# Patient Record
Sex: Female | Born: 1950 | Race: White | Hispanic: No | Marital: Married | Smoking: Never smoker
Health system: Southern US, Community
[De-identification: ages and names within clinical notes are randomized; demographics above are authoritative.]

## PROBLEM LIST (undated history)

## (undated) DIAGNOSIS — M4800 Spinal stenosis, site unspecified: Secondary | ICD-10-CM

## (undated) DIAGNOSIS — C4491 Basal cell carcinoma of skin, unspecified: Secondary | ICD-10-CM | POA: Insufficient documentation

## (undated) DIAGNOSIS — R319 Hematuria, unspecified: Secondary | ICD-10-CM

## (undated) DIAGNOSIS — K219 Gastro-esophageal reflux disease without esophagitis: Secondary | ICD-10-CM

## (undated) DIAGNOSIS — E079 Disorder of thyroid, unspecified: Secondary | ICD-10-CM

## (undated) DIAGNOSIS — G500 Trigeminal neuralgia: Secondary | ICD-10-CM

## (undated) DIAGNOSIS — Z9109 Other allergy status, other than to drugs and biological substances: Secondary | ICD-10-CM

## (undated) DIAGNOSIS — IMO0002 Reserved for concepts with insufficient information to code with codable children: Secondary | ICD-10-CM

## (undated) DIAGNOSIS — S060X9A Concussion with loss of consciousness of unspecified duration, initial encounter: Secondary | ICD-10-CM

## (undated) DIAGNOSIS — J45909 Unspecified asthma, uncomplicated: Secondary | ICD-10-CM

## (undated) DIAGNOSIS — E7800 Pure hypercholesterolemia, unspecified: Secondary | ICD-10-CM

## (undated) DIAGNOSIS — T7840XA Allergy, unspecified, initial encounter: Secondary | ICD-10-CM

## (undated) DIAGNOSIS — N939 Abnormal uterine and vaginal bleeding, unspecified: Secondary | ICD-10-CM

## (undated) DIAGNOSIS — T4145XA Adverse effect of unspecified anesthetic, initial encounter: Secondary | ICD-10-CM

## (undated) DIAGNOSIS — Z8719 Personal history of other diseases of the digestive system: Secondary | ICD-10-CM

## (undated) DIAGNOSIS — M722 Plantar fascial fibromatosis: Secondary | ICD-10-CM

## (undated) DIAGNOSIS — E039 Hypothyroidism, unspecified: Secondary | ICD-10-CM

## (undated) DIAGNOSIS — T8859XA Other complications of anesthesia, initial encounter: Secondary | ICD-10-CM

## (undated) DIAGNOSIS — Z923 Personal history of irradiation: Secondary | ICD-10-CM

## (undated) DIAGNOSIS — C50211 Malignant neoplasm of 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 breast: Secondary | ICD-10-CM

## (undated) DIAGNOSIS — G43909 Migraine, unspecified, not intractable, without status migrainosus: Secondary | ICD-10-CM

## (undated) DIAGNOSIS — Z9889 Other specified postprocedural states: Secondary | ICD-10-CM

## (undated) DIAGNOSIS — IMO0001 Reserved for inherently not codable concepts without codable children: Secondary | ICD-10-CM

## (undated) HISTORY — DX: Hematuria, unspecified: R31.9

## (undated) HISTORY — DX: Gastro-esophageal reflux disease without esophagitis: K21.9

## (undated) HISTORY — DX: Disorder of thyroid, unspecified: E07.9

## (undated) HISTORY — PX: BLEPHAROPTOSIS REPAIR: SHX369

## (undated) HISTORY — PX: NASAL SEPTUM SURGERY: SHX37

## (undated) HISTORY — DX: Spinal stenosis, site unspecified: M48.00

## (undated) HISTORY — DX: Trigeminal neuralgia: G50.0

## (undated) HISTORY — PX: PLANTAR FASCIA SURGERY: SHX746

## (undated) HISTORY — DX: Other allergy status, other than to drugs and biological substances: Z91.09

## (undated) HISTORY — PX: BLADDER SURGERY: SHX569

## (undated) HISTORY — PX: TONSILLECTOMY: SHX28A

## (undated) HISTORY — PX: INGUINAL HERNIA REPAIR: SHX194

## (undated) HISTORY — PX: FOOT NEUROMA SURGERY: SHX646

## (undated) HISTORY — PX: BLADDER SUSPENSION: SHX72

## (undated) HISTORY — DX: Pure hypercholesterolemia, unspecified: E78.00

## (undated) HISTORY — DX: Malignant neoplasm of 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 breast: C50.211

## (undated) HISTORY — PX: TONSILLECTOMY: SUR1361

## (undated) HISTORY — DX: Plantar fascial fibromatosis: M72.2

## (undated) HISTORY — DX: Unspecified asthma, uncomplicated: J45.909

## (undated) HISTORY — PX: TONSILECTOMY/ADENOIDECTOMY WITH MYRINGOTOMY: SHX6125

## (undated) HISTORY — DX: Personal history of other diseases of the digestive system: Z87.19

## (undated) HISTORY — PX: BLEPHAROPLASTY: SUR158

## (undated) HISTORY — DX: Allergy, unspecified, initial encounter: T78.40XA

## (undated) HISTORY — DX: Migraine, unspecified, not intractable, without status migrainosus: G43.909

## (undated) HISTORY — PX: HERNIA REPAIR: SHX51

## (undated) HISTORY — DX: Other specified postprocedural states: Z98.890

## (undated) HISTORY — PX: FOOT SURGERY: SHX648

---
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
## 1989-01-31 HISTORY — PX: OTHER SURGICAL HISTORY: SHX169

## 1990-01-31 DIAGNOSIS — Z8719 Personal history of other diseases of the digestive system: Secondary | ICD-10-CM

## 1990-01-31 HISTORY — DX: Personal history of other diseases of the digestive system: Z87.19

## 2011-12-02 ENCOUNTER — Encounter: Payer: Self-pay | Admitting: Dermatology

## 2011-12-02 ENCOUNTER — Ambulatory Visit: Payer: Self-pay | Admitting: Dermatology

## 2011-12-02 VITALS — BP 126/84 | Ht 66.0 in | Wt 155.0 lb

## 2011-12-02 DIAGNOSIS — L814 Other melanin hyperpigmentation: Secondary | ICD-10-CM

## 2011-12-02 DIAGNOSIS — D1801 Hemangioma of skin and subcutaneous tissue: Secondary | ICD-10-CM

## 2011-12-02 DIAGNOSIS — D225 Melanocytic nevi of trunk: Secondary | ICD-10-CM

## 2011-12-02 DIAGNOSIS — L821 Other seborrheic keratosis: Secondary | ICD-10-CM

## 2011-12-02 NOTE — Progress Notes (Signed)
See Notes scanned under media tab

## 2011-12-05 NOTE — Progress Notes (Signed)
Faxed 12/02/11 chart notes to Dr. Randa Lynn

## 2012-03-30 ENCOUNTER — Ambulatory Visit: Payer: Self-pay | Admitting: Otolaryngology

## 2013-05-31 DIAGNOSIS — C4491 Basal cell carcinoma of skin, unspecified: Secondary | ICD-10-CM

## 2013-05-31 HISTORY — DX: Basal cell carcinoma of skin, unspecified: C44.91

## 2013-06-19 ENCOUNTER — Encounter: Payer: Self-pay | Admitting: Dermatology

## 2013-06-19 ENCOUNTER — Ambulatory Visit: Payer: Self-pay | Admitting: Dermatology

## 2013-06-19 VITALS — BP 132/80 | Ht 65.0 in | Wt 138.0 lb

## 2013-06-19 DIAGNOSIS — L658 Other specified nonscarring hair loss: Secondary | ICD-10-CM | POA: Insufficient documentation

## 2013-06-19 DIAGNOSIS — D225 Melanocytic nevi of trunk: Secondary | ICD-10-CM

## 2013-06-19 DIAGNOSIS — D485 Neoplasm of uncertain behavior of skin: Secondary | ICD-10-CM | POA: Insufficient documentation

## 2013-06-19 NOTE — Progress Notes (Signed)
See Notes scanned under media tab

## 2013-06-21 LAB — SURGICAL PATHOLOGY

## 2013-07-04 ENCOUNTER — Ambulatory Visit: Payer: Self-pay | Admitting: Dermatology

## 2013-07-04 ENCOUNTER — Encounter: Payer: Self-pay | Admitting: Dermatology

## 2013-07-04 VITALS — BP 105/64 | HR 71

## 2013-07-04 DIAGNOSIS — C44311 Basal cell carcinoma of skin of nose: Secondary | ICD-10-CM | POA: Insufficient documentation

## 2013-07-04 NOTE — Patient Instructions (Signed)
[facility], Unknown Jim, M.D., Ph. D.  [phone]    Wound Care Instructions     Today and tomorrow (Thursday and Friday)   Keep bandage clean, dry and in place. Reinforce with tape if necessary.   Take Tylenol or Ibuprofen ad needed for pain.   Apply ice (use a bag of frozen peas or corn, or put ice in a baggie and wrap with a kitchen towel) directly over your bandage for 15 minutes every hour until bedtime on the procedure day.   Rest and minimize activity.       On Saturday morning   Remove bandage. Soaking with water may ease removal.  The bandage is one layer of white tape, one layer of clear tape, gauze, and telfa that is next to the wound.   Wash over the wound with mild soapy water and clean fingers. DO NOT use a washcloth. Pat dry.   Apply a thin layer of Vaseline over the stitches and cover with a bandage.   You may have swelling and bruising around the incision.   You may shower and wash hair.     Saturday until return appointment   Continue to wash the area as instructed.    Activity   No lifting over 10 pounds, sports, exercising, or bending at the waist for 1 week.   Chew softer food.   Elevate head on 2 pillows  for the next 2 days.    Bleeding   A small amount of bleeding is normal.   If it soaks the bandage, apply firm pressure with your hand directly over the bandage for 20 minutes.   DO NOT use ice to apply pressure.    When to call the office   If bleeding does not stop after 20 minutes of firm pressure.   If Tylenol or Ibuprofen does not relieve your pain.   If your wound becomes increasingly painful, warm to touch, drains a pus like substance, or you develop fever and chills.    Please be advised that your suture line is usually much longer than you anticipate.    If you call the office during normal business hours with a non-emergent question, please allow until the end of that business day for [place] to return your call.    For after hours  emergencies, call the same number and the on call Physician will return your call.

## 2013-07-08 NOTE — Progress Notes (Signed)
PREOPERATIVE DIAGNOSIS: Basal cell carcinoma    POSTOPERATIVE DIAGNOSIS: Basal cell carcinoma    OPERATION: Mohs micrographic surgery    INDICATIONS: This 63 y.o. patient was referred by York Cerise, M.D. 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  Mohs micrographic surgery is indicated given the anatomic location.  The preoperative size is 0.3 x 0.4 cm.  The Mohs surgical procedure was explained including other therapeutic options, and the inherent risks of bleeding, scar formation, reaction to local anesthesia, cosmetic deformity, recurrence, infection, and nerve damage.  After informed consent and appropriate instruction, the patient underwent Mohs surgery using fresh tissue technique as follows.    STAGE I: The site of the skin cancer was identified concurrently by both the patient and the Mohs surgeon and marked with a surgical pen; the margins of the excision were delineated with the marking pen. The patient was then positioned on the operative table. The lesion was defined and infiltrated with 1 percent buffered lidocaine with epinephrine 1:100,000. All gross tumor was completely excised in a debulking stage using aggressive curettage and/or cold steel.  With all visible gross tumor completely excised, an excision was made around the debulking defect. Hemostasis was obtained by spot electrodesiccation and a dressing was placed. The tissue was submitted as 2section(s) which was mapped, color coded at their margins and sent to the technician for frozen sectioning. Microscopic tumor was found persisting in 0 sections.    With the lesions clear of microscopic tumor, surgery was considered complete.  Following surgery, the defect measured as follows: 0.4 x 0.5 cm.  The defect was repaired with unilateral single arm advancement ("O to L") flap.    FINAL DIAGNOSIS: Primary Basal cell carcinoma    CONDITION AT TERMINATION OF THERAPY: Carcinoma removed.    COMPLICATIONS:  None.      PREOPERATIVE DIAGNOSIS:  Defect following Mohs micrographic surgery for a basal cell carcinoma  POSTOPERATIVE DIAGNOSIS: Basal cell carcinoma  OPERATION: Adjacent tissue transfer: Unilateral single arm advancement ('O-to-L') flap (Burow's Advancement Flap)    INDICATIONS: The patient was left with a defect measuring 0.4 x 0.5 cm located on the right nasal tip following Mohs micrographic surgery for removal of a basal cell carcinoma.  Various closure modalities were discussed with the patient and it was decided that a Adjacent tissue transfer: Unilateral single arm advancement ('O-to-L') flap (Burow's Advancement Flap) would best preserve normal anatomical and functional relationships.  The procedure was explained, including the risks of bleeding scarring, infection, wound dehiscence, numbness or other nerve damage, flap/graft necrosis and reaction to local anesthesia, informed consent was obtained and the patient underwent the procedure as follows.    Unilateral single arm (Burow's) advancement flap    Procedure:  The patient was taken to the operative suite and positioned comfortably on the operating room table.  The area was anesthetized with 1% Lidocaine with epinephrine.  The area was washed with Hibiclens, rinsed with saline and draped with sterile towels. Wound edges were debeveled and undermined.  An incision was made in the lateral direction.  The flap was carefully undermined in a subcutaneous plane to the pedicle base.  Bleeding was controlled with spot electrodesiccation.  The flap was then advanced into the primary defect and secured with buried 5-0 Monocryl sutures.  The secondary defect was closed deeply with buried 5-0  Monocryl sutures. An excess cone of tissue was removed with the triangulation technique. The epidermis was meticulously approximated with simple running 5-0  Prolene sutures. Final flap dimensions: 3 x 2 cm.  A sterile pressure dressing was applied and wound care instructions  were given.  The patient was discharged ambulatory and with stable vital signs.  Return visit in one week for suture removal.      Final Procedure: Unilateral single arm (Burows) advancement flap.    Complications:  None.      For this note, Micah Flesher is acting as my scribe. This note was reviewed by me and edited as appropriate.

## 2013-07-09 ENCOUNTER — Telehealth: Payer: Self-pay

## 2013-07-09 NOTE — Telephone Encounter (Signed)
Spoke with patient about path results on 06/26/13 BCC right nasal tip-discussed mohs and XRT-pt will call Dr Kathlen Brunswick to schedule appt.

## 2013-07-11 ENCOUNTER — Ambulatory Visit: Payer: Self-pay | Admitting: Dermatology

## 2013-07-11 DIAGNOSIS — Z4802 Encounter for removal of sutures: Secondary | ICD-10-CM

## 2013-07-11 NOTE — Progress Notes (Signed)
Plan:    Keep applying vasline to the area until next visit.    Follow-up:

## 2013-07-11 NOTE — Progress Notes (Signed)
Follow-up visit 1 days status-post Mohs micrographic surgery for a Basal cell carcinoma of the right nasal tip with unilateral single arm advancement flap ("O to L") flap.  The patient is doing well without concerns.    Physical Examination:    The patient is alert and oriented and in no acute distress.  The surgical wound is clean and intact.  Erythema: Mild  Edema: Mild  Infection: None  Dehiscence: None  Contusion: None  Hematoma: None    The contour and function of the right nasal tip is well preserved.      Assessment:    Well healing surgical wound, status-post Mohs micrographic surgery.    Plan:    Sutures removed, wound care instructions provided.    Follow-up:    2 week(s)    For this note, Mauricia Area, RN  is acting as my scribe. This note was reviewed by me and edited where appropriate.

## 2013-07-29 ENCOUNTER — Encounter: Payer: Self-pay | Admitting: Dermatology

## 2013-07-29 ENCOUNTER — Ambulatory Visit: Payer: Self-pay | Admitting: Dermatology

## 2013-07-29 DIAGNOSIS — Z5189 Encounter for other specified aftercare: Secondary | ICD-10-CM

## 2013-07-29 NOTE — Progress Notes (Signed)
Follow-up visit 3 weeks status-post Mohs micrographic surgery for a Basal cell carcinoma of the right nasal tip with unilateral single arm advancement flap ("O to L") flap.  The patient is doing well without concerns.    Physical Examination:    The patient is alert and oriented and in no acute distress.  The surgical wound is clean and intact.  Erythema: Mild  Edema: Mild  Infection: None  Dehiscence: None  Contusion: None  Hematoma: None    The contour and function of the right nasal tip is well preserved.      Assessment:    Well healing surgical wound, status-post Mohs micrographic surgery.    Plan:    Can massage with vaseline at night if she wants to.     Follow-up:    With Dr. Tommi Emery for routine skin exams.     For this note, Toni Powell is acting as my scribe. This note was reviewed by me and edited where appropriate

## 2013-08-01 ENCOUNTER — Ambulatory Visit: Payer: Self-pay | Admitting: Dermatology

## 2014-03-21 ENCOUNTER — Ambulatory Visit: Payer: Self-pay | Admitting: Dermatology

## 2014-03-21 ENCOUNTER — Encounter: Payer: Self-pay | Admitting: Dermatology

## 2014-03-21 ENCOUNTER — Encounter (INDEPENDENT_AMBULATORY_CARE_PROVIDER_SITE_OTHER): Payer: Self-pay

## 2014-03-21 VITALS — BP 126/70 | Ht 65.0 in | Wt 145.0 lb

## 2014-03-21 DIAGNOSIS — D485 Neoplasm of uncertain behavior of skin: Secondary | ICD-10-CM

## 2014-03-21 DIAGNOSIS — L821 Other seborrheic keratosis: Secondary | ICD-10-CM

## 2014-03-21 DIAGNOSIS — Z85828 Personal history of other malignant neoplasm of skin: Secondary | ICD-10-CM | POA: Insufficient documentation

## 2014-03-21 DIAGNOSIS — D225 Melanocytic nevi of trunk: Secondary | ICD-10-CM

## 2014-03-21 NOTE — Progress Notes (Signed)
See Notes scanned under media tab

## 2014-03-24 LAB — SURGICAL PATHOLOGY

## 2014-09-18 ENCOUNTER — Other Ambulatory Visit: Payer: Self-pay

## 2014-09-18 DIAGNOSIS — Z1231 Encounter for screening mammogram for malignant neoplasm of breast: Secondary | ICD-10-CM

## 2014-10-07 ENCOUNTER — Ambulatory Visit: Payer: Self-pay | Admitting: Podiatry

## 2014-10-22 DIAGNOSIS — H1010 Acute atopic conjunctivitis, unspecified eye: Secondary | ICD-10-CM | POA: Insufficient documentation

## 2014-10-22 DIAGNOSIS — J309 Allergic rhinitis, unspecified: Principal | ICD-10-CM

## 2014-10-22 DIAGNOSIS — J45909 Unspecified asthma, uncomplicated: Secondary | ICD-10-CM | POA: Insufficient documentation

## 2014-10-28 ENCOUNTER — Ambulatory Visit
Admission: RE | Admit: 2014-10-28 | Discharge: 2014-10-28 | Disposition: A | Discharge status: Home / Self Care | Payer: 59 | Source: Ambulatory Visit

## 2014-10-28 DIAGNOSIS — Z1231 Encounter for screening mammogram for malignant neoplasm of breast: Secondary | ICD-10-CM

## 2014-10-30 ENCOUNTER — Other Ambulatory Visit: Payer: Self-pay | Admitting: Family Medicine

## 2014-10-30 DIAGNOSIS — R928 Other abnormal and inconclusive findings on diagnostic imaging of breast: Secondary | ICD-10-CM

## 2014-11-06 ENCOUNTER — Other Ambulatory Visit: Payer: Self-pay | Admitting: Family Medicine

## 2014-11-06 ENCOUNTER — Ambulatory Visit
Admission: RE | Admit: 2014-11-06 | Discharge: 2014-11-06 | Disposition: A | Discharge status: Home / Self Care | Payer: 59 | Source: Ambulatory Visit | Attending: Family Medicine | Admitting: Family Medicine

## 2014-11-06 DIAGNOSIS — R928 Other abnormal and inconclusive findings on diagnostic imaging of breast: Secondary | ICD-10-CM

## 2014-11-10 ENCOUNTER — Other Ambulatory Visit: Payer: Self-pay | Admitting: Family Medicine

## 2014-11-10 DIAGNOSIS — R928 Other abnormal and inconclusive findings on diagnostic imaging of breast: Secondary | ICD-10-CM

## 2014-11-11 ENCOUNTER — Ambulatory Visit
Admission: RE | Admit: 2014-11-11 | Discharge: 2014-11-11 | Disposition: A | Discharge status: Home / Self Care | Payer: 59 | Source: Ambulatory Visit | Attending: Family Medicine | Admitting: Family Medicine

## 2014-11-11 DIAGNOSIS — R928 Other abnormal and inconclusive findings on diagnostic imaging of breast: Secondary | ICD-10-CM

## 2014-11-13 ENCOUNTER — Ambulatory Visit (INDEPENDENT_AMBULATORY_CARE_PROVIDER_SITE_OTHER): Payer: 59

## 2014-11-13 ENCOUNTER — Telehealth: Payer: Self-pay | Admitting: *Deleted

## 2014-11-13 ENCOUNTER — Encounter: Payer: Self-pay | Admitting: *Deleted

## 2014-11-13 DIAGNOSIS — C50211 Malignant neoplasm of 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 breast: Secondary | ICD-10-CM

## 2014-11-13 DIAGNOSIS — J309 Allergic rhinitis, unspecified: Secondary | ICD-10-CM | POA: Diagnosis not present

## 2014-11-13 HISTORY — DX: Malignant neoplasm of 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 breast: C50.211

## 2014-11-13 NOTE — Telephone Encounter (Signed)
Confirmed [facility] for 11/19/14 at 1230 .  Instructions and contact information given.

## 2014-11-19 ENCOUNTER — Ambulatory Visit: Payer: 59 | Admitting: Physical Therapy

## 2014-11-19 ENCOUNTER — Encounter: Payer: Self-pay | Admitting: Hematology and Oncology

## 2014-11-19 ENCOUNTER — Ambulatory Visit (HOSPITAL_BASED_OUTPATIENT_CLINIC_OR_DEPARTMENT_OTHER): Payer: 59 | Admitting: Hematology and Oncology

## 2014-11-19 ENCOUNTER — Other Ambulatory Visit (HOSPITAL_BASED_OUTPATIENT_CLINIC_OR_DEPARTMENT_OTHER): Payer: 59

## 2014-11-19 ENCOUNTER — Ambulatory Visit
Admission: RE | Admit: 2014-11-19 | Discharge: 2014-11-19 | Disposition: A | Discharge status: Home / Self Care | Payer: 59 | Source: Ambulatory Visit | Attending: Radiation Oncology | Admitting: Radiation Oncology

## 2014-11-19 ENCOUNTER — Other Ambulatory Visit: Payer: Self-pay | Admitting: General Surgery

## 2014-11-19 VITALS — BP 124/51 | HR 73 | Temp 98.4°F | Resp 18 | Ht 65.0 in | Wt 146.7 lb

## 2014-11-19 DIAGNOSIS — D0511 Intraductal carcinoma in situ of right breast: Secondary | ICD-10-CM | POA: Diagnosis not present

## 2014-11-19 DIAGNOSIS — Z800 Family history of malignant neoplasm of digestive organs: Secondary | ICD-10-CM

## 2014-11-19 DIAGNOSIS — C50211 Malignant neoplasm of 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 breast: Secondary | ICD-10-CM

## 2014-11-19 DIAGNOSIS — Z803 Family history of malignant neoplasm of breast: Secondary | ICD-10-CM

## 2014-11-19 LAB — COMPREHENSIVE METABOLIC PANEL (CC13)
ALBUMIN: 3.7 g/dL (ref 3.5–5.0)
ALK PHOS: 51 U/L (ref 40–150)
ALT: 25 U/L (ref 0–55)
ANION GAP: 6 meq/L (ref 3–11)
AST: 22 U/L (ref 5–34)
BILIRUBIN TOTAL: 0.38 mg/dL (ref 0.20–1.20)
BUN: 16.6 mg/dL (ref 7.0–26.0)
CALCIUM: 9.4 mg/dL (ref 8.4–10.4)
CO2: 27 mEq/L (ref 22–29)
Chloride: 108 mEq/L (ref 98–109)
Creatinine: 0.8 mg/dL (ref 0.6–1.1)
EGFR: 83 mL/min/{1.73_m2} — AB (ref >90)
Glucose: 97 mg/dl (ref 70–140)
Potassium: 4.6 mEq/L (ref 3.5–5.1)
Sodium: 141 mEq/L (ref 136–145)
TOTAL PROTEIN: 6.9 g/dL (ref 6.4–8.3)

## 2014-11-19 LAB — CBC WITH DIFFERENTIAL/PLATELET
BASO%: 0.4 % (ref 0.0–2.0)
Basophils Absolute: 0 10*3/uL (ref 0.0–0.1)
EOS ABS: 0.1 10*3/uL (ref 0.0–0.5)
EOS%: 0.9 % (ref 0.0–7.0)
HCT: 39.3 % (ref 34.8–46.6)
HEMOGLOBIN: 13.1 g/dL (ref 11.6–15.9)
LYMPH%: 21.8 % (ref 14.0–49.7)
MCH: 29.4 pg (ref 25.1–34.0)
MCHC: 33.3 g/dL (ref 31.5–36.0)
MCV: 88.3 fL (ref 79.5–101.0)
MONO#: 0.5 10*3/uL (ref 0.1–0.9)
MONO%: 7.6 % (ref 0.0–14.0)
NEUT%: 69.3 % (ref 38.4–76.8)
NEUTROS ABS: 4.7 10*3/uL (ref 1.5–6.5)
PLATELETS: 209 10*3/uL (ref 145–400)
RBC: 4.45 10*6/uL (ref 3.70–5.45)
RDW: 14.6 % — ABNORMAL HIGH (ref 11.2–14.5)
WBC: 6.8 10*3/uL (ref 3.9–10.3)
lymph#: 1.5 10*3/uL (ref 0.9–3.3)

## 2014-11-19 NOTE — Progress Notes (Signed)
Radiation Oncology         [phone] ________________________________  Initial Outpatient Consultation  Name: Valerie Snyder MRN: [id number]  Date: 11/19/2014  DOB: 06/23/1950  HL:KTGYBW,LSLHTDSKA STEWART, MD  Jovita Kussmaul, MD   REFERRING PHYSICIAN: Autumn Messing III, MD  DIAGNOSIS:  Right breast low grade DCIS, ER +, PR +    ICD-9-CM ICD-10-CM   1. Breast cancer of 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 breast ([place]) 174.2 C50.211      HISTORY OF PRESENT ILLNESS::Valerie Snyder is a 64 y.o. female who presents for our multidisciplinary breast clinic. The patient had a routine screening bilateral mammogram with 3D Tomo with CAD which found right breast calcifications. She underwent additional imaging and a biopsy was performed. A lesion was noted in the upper inner quadrant approximately 4 mm in size. Biopsy showed low grade DCIS, ER +, PR +. This appeared to involve an intraductal papilloma and calcification.   Mrs. Platter presents today with her husband. She recently injured her left wrist from using the computer track pad rather than the mouse. The left wrist is swollen and tender on palpation.   PREVIOUS RADIATION THERAPY: No  PAST MEDICAL HISTORY:  has a past medical history of Breast cancer of 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 breast (Valerie Snyder) (11/13/2014).    PAST SURGICAL HISTORY:No past surgical history on file.  FAMILY HISTORY: family history is not on file.  SOCIAL HISTORY:   Married,  recently moved from the [place] area. Recently retired   ALLERGIES: Keflex and Toradol  MEDICATIONS:  Current Outpatient Prescriptions  Medication Sig Dispense Refill  . albuterol (PROVENTIL HFA;VENTOLIN HFA) 108 (90 BASE) MCG/ACT inhaler Inhale into the lungs every 6 (six) hours as needed for wheezing or shortness of breath.    . calcium carbonate (CALCIUM 600) 1500 (600 CA) MG TABS tablet Take by mouth 2 (two) times daily with a meal.    . cetirizine (ZYRTEC) 10  MG tablet Take 10 mg by mouth daily.    . Coenzyme Q10 (CO Q-10) 100 MG CAPS Take by mouth.    . Cranberry 125 MG TABS Take by mouth.    . Cranberry-Vitamin C-Vitamin E (CRANBERRY PLUS VITAMIN C) 4200-20-3 MG-MG-UNIT CAPS Take by mouth.    . estradiol-norethindrone (ACTIVELLA) 1-0.5 MG per tablet Take 1 tablet by mouth daily.    . fluticasone (FLONASE) 50 MCG/ACT nasal spray Place 2 sprays into both nostrils daily.    Marland Kitchen L-Arginine 500 MG CAPS Take by mouth.    . Lansoprazole (PREVACID PO) Take 30 mg by mouth.    . levothyroxine (SYNTHROID, LEVOTHROID) 100 MCG tablet Take 100 mcg by mouth daily before breakfast.    . Magnesium 500 MG CAPS Take by mouth.    . Misc Natural Products (OSTEO BI-FLEX TRIPLE STRENGTH PO) Take by mouth.    . Multiple Vitamins-Minerals (CENTRUM SILVER ADULT 50+ PO) Take by mouth.    . Nutritional Supplements (OSTEO ADVANCE PO) Take by mouth.    . Omega-3 Fatty Acids (FISH OIL) 1000 MG CAPS Take by mouth.    . OXcarbazepine (TRILEPTAL) 150 MG tablet Take 150 mg by mouth 2 (two) times daily.    Marland Kitchen pyridOXINE (VITAMIN B-6) 100 MG tablet Take 100 mg by mouth daily.    . rosuvastatin (CRESTOR) 20 MG tablet Take 20 mg by mouth daily.    . SUMAtriptan (IMITREX) 25 MG tablet Take 25 mg by mouth every 2 (two) hours as needed for migraine. May repeat in 2  hours if headache persists or recurs.    . vitamin B-12 (CYANOCOBALAMIN) 1000 MCG tablet Take 1,000 mcg by mouth daily.    . Zinc 50 MG CAPS Take by mouth.     No current facility-administered medications for this encounter.    REVIEW OF SYSTEMS:  A 15 point review of systems is documented in the electronic medical record. This was obtained by the nursing staff. However, I reviewed this with the patient to discuss relevant findings and make appropriate changes.  No pain within the left or right breast nipple discharge or bleeding prior to biopsy. Patient denies any problems with swelling in her right arm. No bony pain headaches  dizziness or blurred vision   PHYSICAL EXAM:  Vitals with BMI 11/19/2014  Height 5\' 5"   Weight 146 lbs 11 oz  BMI 22.9  Systolic 798  Diastolic 51  Pulse 73  Respirations 18   Lungs are clear to auscultation bilaterally. Heart has regular rate and rhythm. No palpable cervical, supraclavicular, or axillary adenopathy. Left wrist is swollen and tender on palpation. Left breast revealed no palpable mass or nipple discharge. Examination of the right breast revealed a steri strip in place in the upper inner quadrant and a small biopsy site. No palpable mass or nipple discharge. Mildly tender on palpation in the areolar border possibly from a cyst.     ECOG = 0  LABORATORY DATA:  Lab Results  Component Value Date   WBC 6.8 11/19/2014   HGB 13.1 11/19/2014   HCT 39.3 11/19/2014   MCV 88.3 11/19/2014   PLT 209 11/19/2014   NEUTROABS 4.7 11/19/2014   No results found for: NA, K, CL, CO2, GLUCOSE, CREATININE, CALCIUM    RADIOGRAPHY: Mm Digital Diagnostic Unilat R  11/11/2014  CLINICAL DATA:  Stereotactic biopsy was performed of calcifications in the upper-inner quadrant of the right breast. EXAM: DIAGNOSTIC RIGHT MAMMOGRAM POST STEREOTACTIC BIOPSY COMPARISON:  Previous exam(s). FINDINGS: Mammographic images were obtained following stereotactic guided biopsy of calcifications in the upper inner right breast. A coil shaped biopsy clip is satisfactorily positioned at the biopsy site in the upper inner right breast. IMPRESSION: Satisfactory position of coil shaped biopsy clip. Final Assessment: Post Procedure Mammograms for Marker Placement Electronically Signed   By: Curlene Dolphin M.D.   On: 11/11/2014 16:52   Mm Digital Diagnostic Unilat R  11/06/2014  ADDENDUM REPORT: 11/06/2014 16:13 ADDENDUM: Patient is scheduled for the first available appointment at the [facility]. Electronically Signed   By: Fidela Salisbury M.D.   On: 11/06/2014 16:13  11/06/2014  CLINICAL DATA:   Right breast microcalcifications seen on most recent screening mammography. EXAM: DIGITAL DIAGNOSTIC RIGHT MAMMOGRAM COMPARISON:  Previous exam(s). ACR Breast Density Category c: The breast tissue is heterogeneously dense, which may obscure small masses. FINDINGS: Compression magnification views of the right breast demonstrate a group of indeterminate calcifications in the upper inner right breast, middle depth, which measures 0.4 by 0.4 by 0.3 cm. There is no associated mass. IMPRESSION: 4 mm indeterminate group of calcifications in the upper inner right breast, for which stereotactic core needle biopsy is recommended. RECOMMENDATION: Stereotactic core needle biopsy of the right breast. The patient will be scheduled at her convenience at the [facility]. I have discussed the findings and recommendations with the patient. Results were also provided in writing at the conclusion of the visit. If applicable, a reminder letter will be sent to the patient regarding the next appointment. BI-RADS CATEGORY  4: Suspicious. Electronically Signed: By: Fidela Salisbury M.D. On: 11/06/2014 14:52   Mm Screening Breast Tomo Bilateral  10/29/2014  CLINICAL DATA:  Screening. EXAM: DIGITAL SCREENING BILATERAL MAMMOGRAM WITH 3D TOMO WITH CAD COMPARISON:  Previous exam(s). ACR Breast Density Category c: The breast tissue is heterogeneously dense, which may obscure small masses. FINDINGS: In the right breast, calcifications warrant further evaluation with magnified views. In the left breast, no findings suspicious for malignancy. Images were processed with CAD. IMPRESSION: Further evaluation is suggested for calcifications in the right breast. RECOMMENDATION: Diagnostic mammogram of the right breast. (Code:[id number]) The patient will be contacted regarding the findings, and additional imaging will be scheduled. BI-RADS CATEGORY  0: Incomplete. Need additional imaging evaluation and/or prior mammograms for  comparison. Electronically Signed   By: Altamese Cabal M.D.   On: 10/29/2014 08:16   Mm Rt Breast Bx W Loc Dev 1st Lesion Image Bx Spec Stereo Guide  11/17/2014  ADDENDUM REPORT: 11/13/2014 15:15 ADDENDUM: Pathology reveals low grade ductal carcinoma in situ involving intraductal papilloma and calcification of the Right breast. This was found to be concordant by Dr. Curlene Dolphin. Pathology results were discussed with the patient via telephone. The patient reported tenderness at the biopsy site and is doing well otherwise. Post biopsy care and instructions were reviewed and questions were answered. The patient was encouraged to call [facility] with any additional questions and or concerns. The patient was referred to the [facility] at [facility] on November 19, 2014. Pathology results reported by Constance Holster on November 13, 2014. Electronically Signed   By: Curlene Dolphin M.D.   On: 11/13/2014 15:15  11/17/2014  CLINICAL DATA:  Calcifications upper inner right breast, for which stereotactic biopsy was recommended. EXAM: RIGHT BREAST STEREOTACTIC CORE NEEDLE BIOPSY COMPARISON:  Previous exams. FINDINGS: The patient and I discussed the procedure of stereotactic-guided biopsy including benefits and alternatives. We discussed the high likelihood of a successful procedure. We discussed the risks of the procedure including infection, bleeding, tissue injury, clip migration, and inadequate sampling. Informed written consent was given. The usual time out protocol was performed immediately prior to the procedure. Using sterile technique and 2% Lidocaine as local anesthetic, under stereotactic guidance, a 9 gauge vacuum assist device was used to perform core needle biopsy of calcifications in the upper inner quadrant of the right breast using a superior to inferior approach. Specimen radiograph was performed showing  calcifications within at least 2 of the cores. Specimens with calcifications are identified for pathology. At the conclusion of the procedure, a coil shaped tissue marker clip was deployed into the biopsy cavity. Follow-up 2-view mammogram was performed and dictated separately. IMPRESSION: Stereotactic-guided biopsy of the right breast. No apparent complications. Electronically Signed: By: Curlene Dolphin M.D. On: 11/11/2014 16:51      IMPRESSION: Ms. Sappington is a pleasant 64 yo woman with Right breast low grade DCIS, ER +, PR +. The lesion is likely small in light of the minimal calcifications noted on mammography The patient would be a good candidate for breast conservation with surgery and will consider radiation therapy in the post operative setting.   PLAN: I have discussed the benefits, side effects, and risks of external beam radiation treatment with the patient and family. We will follow up after her lumpectomy to discuss the pathology results. I have advised for the patient to see her primary care physician in relation to her painful swollen  left wrist.      This document serves as a record of services personally performed by Gery Pray, MD. It was created on his behalf by Arlyce Harman, a trained medical scribe. The creation of this record is based on the scribe's personal observations and the provider's statements to them. This document has been checked and approved by the attending provider. ------------------------------------------------  Blair Promise, PhD, MD

## 2014-11-19 NOTE — Progress Notes (Signed)
Pt's husband wanted to discuss financial assistance.  I informed him since this is her first visit there is no treatment plan available to offer assistance for.  Once her treatment plan has been established I will review it and schedule an appointment with the pt to go over foundations that offer copay assistance and any other resources that are available.  He wanted to know if he had to contact every department to inquire about financial assistance.  I told him yes and I gave him Meredith's contact information if radiation is part of her treatment.  Although frustrated he verbalized understanding.

## 2014-11-19 NOTE — Assessment & Plan Note (Signed)
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  Pathology review: I discussed with the patient the difference between DCIS and invasive breast cancer. It is considered a precancerous lesion. DCIS is classified as a 0. It is generally detected through mammograms as calcifications. We discussed the significance of grades and its impact on prognosis. We also discussed the importance of ER and PR receptors and their implications to adjuvant treatment options. Prognosis of DCIS dependence on grade, comedo necrosis. It is anticipated that if not treated, 20-30% of DCIS can develop into invasive breast cancer.  Recommendation: genetic counseling given the fact that she has 2 paternal cousins with breast cancer before age 42 1. Breast conserving surgery 2. Followed by adjuvant radiation therapy 3. Followed by antiestrogen therapy with tamoxifen vs anastrozole 5 years  Return to clinic after surgery to discuss the final pathology report and come up with an adjuvant treatment plan.

## 2014-11-19 NOTE — Progress Notes (Signed)
[place] NOTE  Patient Care Team: Leighton Ruff, MD as PCP - General (Family Medicine) Autumn Messing III, MD as Consulting Physician (General Surgery) Nicholas Lose, MD as Consulting Physician (Hematology and Oncology) Gery Pray, MD as Consulting Physician (Radiation Oncology) Mauro Kaufmann, RN as Registered Nurse Rockwell Germany, RN as Registered Nurse  CHIEF COMPLAINTS/PURPOSE OF CONSULTATION:  Newly diagnosed Right breast DCIS  HISTORY OF PRESENTING ILLNESS:  Valerie Snyder 64 y.o. female is here because of recent diagnosis of right breast DCIS. She had T screening mammogram which showed indeterminate calcifications in the right breast. This was evaluated by ultrasound and biopsy was performed. Pathology came back as low-grade DCIS that was ER 100% when necessary 100%. She was presented at the multidisciplinary tumor board and she is here today to discuss treatment plan. She denies any lumps or nodules. Recently she injured her left hand at the wrist which is now swollen and painful. She is also scheduled to undergo vaginal ultrasound this week for lower abdominal symptoms.   I reviewed her records extensively and collaborated the history with the patient.  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  She was exposed to hormone replacement therapy for 16 years She has  family history of Breast/GYN/GI cancer 2 paternal cousins diagnosed with breast  cancer age 50 and 40  MEDICAL HISTORY:  Past Medical History  Diagnosis Date  . Breast cancer of 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 breast ([place]) 11/13/2014  . Thyroid disease   . H/O bilateral inguinal hernia repair 1992  . GERD (gastroesophageal reflux disease)   . Migraines   . Hypercholesteremia   . Allergy   . Asthma   . Plantar fasciitis, bilateral   . Spinal stenosis     SURGICAL HISTORY: Past Surgical History  Procedure Laterality Date  . Tonsilectomy/adenoidectomy with myringotomy    . Bladder suspension    . Removal of basal cell carcinoma (nose)    . Neuromas removed from right foot    . Nasal septum surgery      SOCIAL HISTORY: Social History   Social History  . Marital Status: Married    Spouse Name: N/A  . Number of Children: N/A  . Years of Education: N/A   Occupational History  . Not on file.   Social History Main Topics  . Smoking status: Never Smoker   . Smokeless tobacco: Not on file  . Alcohol Use: No  . Drug Use: No  . Sexual Activity: Not on file   Other Topics Concern  . Not on file   Social History Narrative    FAMILY HISTORY: Family History  Problem Relation Age of Onset  . Stomach cancer Maternal Aunt   . Breast cancer Cousin     ALLERGIES:  is allergic to keflex and toradol.  MEDICATIONS:  Current Outpatient Prescriptions  Medication Sig Dispense Refill  . albuterol (PROVENTIL HFA;VENTOLIN HFA) 108 (90 BASE) MCG/ACT inhaler Inhale into the lungs  every 6 (six) hours as needed for wheezing or shortness of breath.    . calcium carbonate (CALCIUM 600) 1500 (600 CA) MG TABS tablet Take by mouth 2 (two) times daily with a meal.    . cetirizine (ZYRTEC) 10 MG tablet Take 10 mg by mouth daily.    . Cholecalciferol (D-3-5) 5000 UNITS capsule Take 5,000 Units by mouth daily.    . Coenzyme Q10 (CO Q-10) 100 MG CAPS Take by mouth.    . Cranberry-Vitamin C-Vitamin E (CRANBERRY PLUS VITAMIN C) 4200-20-3 MG-MG-UNIT CAPS Take by mouth.     . EPINEPHrine (EPIPEN 2-PAK) 0.3 mg/0.3 mL IJ SOAJ injection Inject into the muscle once.    Marland Kitchen estradiol (ESTRACE) 0.1 MG/GM vaginal cream Place 1 Applicatorful vaginally at bedtime.    Marland Kitchen estradiol-norethindrone (ACTIVELLA) 1-0.5 MG per tablet Take 1 tablet by mouth daily.    . fluticasone (FLONASE) 50 MCG/ACT nasal spray Place 2 sprays into both nostrils daily.    Marland Kitchen L-Arginine 500 MG CAPS Take by mouth.    . Lansoprazole (PREVACID PO) Take 30 mg by mouth.    . levothyroxine (SYNTHROID, LEVOTHROID) 100 MCG tablet Take 100 mcg by mouth daily before breakfast.    . Magnesium 500 MG CAPS Take by mouth.    . Misc Natural Products (OSTEO BI-FLEX TRIPLE STRENGTH PO) Take by mouth.    . Multiple Vitamins-Minerals (CENTRUM SILVER ADULT 50+ PO) Take by mouth.    . Olopatadine HCl 0.6 % SOLN USE 1-2 SPRAYS IN EACH NOSTRIL ONCE DAILY IN THE EVENING AS NEEDED FOR RUNNY NOSE OR CONGESTION.  4  . Omega-3 Fatty Acids (FISH OIL) 1000 MG CAPS Take by mouth.    . Probiotic Product (SOLUBLE FIBER/PROBIOTICS PO) Take by mouth.    . pyridOXINE (VITAMIN B-6) 100 MG tablet Take 100 mg by mouth daily.    . rosuvastatin (CRESTOR) 20 MG tablet Take 20 mg by mouth daily.    . SUMAtriptan (IMITREX) 25 MG tablet Take 25 mg by mouth every 2 (two) hours as needed for migraine. May repeat in 2 hours if headache persists or recurs.    . tolterodine (DETROL LA) 4 MG 24 hr capsule Take 4 mg by mouth daily.    . TURMERIC CURCUMIN PO Take 1,000 mg by mouth 3 (three) times daily.    . vitamin B-12 (CYANOCOBALAMIN) 1000 MCG tablet Take 1,000 mcg by mouth daily.    . Zinc 50 MG CAPS Take by mouth.    . Cranberry 125 MG TABS Take by mouth.    . Nutritional Supplements (OSTEO ADVANCE PO) Take by mouth.    . OXcarbazepine (TRILEPTAL) 150 MG tablet Take 150 mg by mouth 2 (two) times daily.     No current facility-administered medications for this visit.    REVIEW OF SYSTEMS:   Constitutional: Denies fevers, chills or abnormal  night sweats Eyes: Denies blurriness of vision, double vision or watery eyes Ears, nose, mouth, throat, and face: Denies mucositis or sore throat Respiratory: Denies cough, dyspnea or wheezes Cardiovascular: Denies palpitation, chest discomfort or lower extremity swelling Gastrointestinal:  Denies nausea, heartburn or change in bowel habits Skin: Denies abnormal skin rashes Lymphatics: Denies new lymphadenopathy or easy bruising Neurological:Denies numbness, tingling or new weaknesses Behavioral/Psych: Mood is stable, no new changes  Breast:  Denies any palpable lumps or discharge Extremity left arm wrist swelling All other systems were reviewed with the patient and are negative.  PHYSICAL EXAMINATION: ECOG PERFORMANCE STATUS: 1 - Symptomatic but completely  ambulatory  Filed Vitals:   11/19/14 1259  BP: 124/51  Pulse: 73  Temp: 98.4 F (36.9 C)  Resp: 18   Filed Weights   11/19/14 1259  Weight: 146 lb 11.2 oz (66.543 kg)    GENERAL:alert, no distress and comfortable SKIN: skin color, texture, turgor are normal, no rashes or significant lesions EYES: normal, conjunctiva are pink and non-injected, sclera clear OROPHARYNX:no exudate, no erythema and lips, buccal mucosa, and tongue normal  NECK: supple, thyroid normal size, non-tender, without nodularity LYMPH:  no palpable lymphadenopathy in the cervical, axillary or inguinal LUNGS: clear to auscultation and percussion with normal breathing effort HEART: regular rate & rhythm and no murmurs and no lower extremity edema ABDOMEN:abdomen soft, non-tender and normal bowel sounds Musculoskeletal:no cyanosis of digits and no clubbing  PSYCH: alert & oriented x 3 with fluent speech NEURO: no focal motor/sensory deficits BREAST: No palpable nodules in breast. 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  Pathology review: I discussed with the patient the difference between DCIS and invasive breast cancer. It is considered a precancerous lesion. DCIS is classified as a 0. It is generally detected through mammograms as calcifications. We discussed the significance of grades and its impact on prognosis. We also discussed the importance of ER and PR receptors and their implications to adjuvant treatment options. Prognosis of DCIS dependence on grade, comedo necrosis. It is anticipated that if not treated, 20-30% of DCIS can develop into invasive breast cancer.  Recommendation: genetic counseling given the fact that she has 2 paternal cousins with breast cancer before age 1 1. Breast conserving surgery 2. Followed by adjuvant radiation therapy 3. Followed by antiestrogen therapy with tamoxifen vs anastrozole 5 years  Return to clinic after surgery to discuss the final pathology report and come up with an adjuvant treatment plan.  All questions were answered. The patient knows to call the clinic with any problems, questions or concerns.    Rulon Eisenmenger, MD 4:23 PM

## 2014-11-21 ENCOUNTER — Ambulatory Visit (INDEPENDENT_AMBULATORY_CARE_PROVIDER_SITE_OTHER): Payer: 59 | Admitting: Neurology

## 2014-11-21 ENCOUNTER — Other Ambulatory Visit: Payer: 59

## 2014-11-21 ENCOUNTER — Ambulatory Visit (HOSPITAL_BASED_OUTPATIENT_CLINIC_OR_DEPARTMENT_OTHER): Payer: 59 | Admitting: Genetic Counselor

## 2014-11-21 ENCOUNTER — Encounter: Payer: Self-pay | Admitting: Genetic Counselor

## 2014-11-21 DIAGNOSIS — Z85828 Personal history of other malignant neoplasm of skin: Secondary | ICD-10-CM

## 2014-11-21 DIAGNOSIS — D0511 Intraductal carcinoma in situ of right breast: Secondary | ICD-10-CM | POA: Diagnosis not present

## 2014-11-21 DIAGNOSIS — Z803 Family history of malignant neoplasm of breast: Secondary | ICD-10-CM

## 2014-11-21 DIAGNOSIS — Z86010 Personal history of colonic polyps: Secondary | ICD-10-CM

## 2014-11-21 DIAGNOSIS — Z315 Encounter for genetic counseling: Secondary | ICD-10-CM | POA: Diagnosis not present

## 2014-11-21 DIAGNOSIS — Z8043 Family history of malignant neoplasm of testis: Secondary | ICD-10-CM

## 2014-11-21 DIAGNOSIS — Z8371 Family history of colonic polyps: Secondary | ICD-10-CM

## 2014-11-21 DIAGNOSIS — Z808 Family history of malignant neoplasm of other organs or systems: Secondary | ICD-10-CM

## 2014-11-21 DIAGNOSIS — J309 Allergic rhinitis, unspecified: Secondary | ICD-10-CM | POA: Diagnosis not present

## 2014-11-21 DIAGNOSIS — Z800 Family history of malignant neoplasm of digestive organs: Secondary | ICD-10-CM | POA: Diagnosis not present

## 2014-11-21 DIAGNOSIS — C50211 Malignant neoplasm of 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 breast: Secondary | ICD-10-CM

## 2014-11-21 NOTE — Progress Notes (Signed)
  [facility] Psychosocial Distress Screening Counseling Intern was referred by distress screening protocol.  The patient scored a 1 on the Psychosocial Distress Thermometer which indicatesMild distress. Counseling Intern to assess for distress and other psychosocial needs.   ONCBCN DISTRESS SCREENING 11/21/2014  Screening Type Initial Screening  Distress experienced in past week (1-10) 1  Family Problem type Other (comment)  Information Concerns Type Lack of info about diagnosis;Lack of info about treatment;Lack of info about complementary therapy choices;Lack of info about maintaining fitness  Referral to financial advocate Yes  Referral to support programs Yes   Counseling Intern note: Met with Pt and husband Christia Reading during [facility] on 10/19. Pt reported feeling positive about her Dx and treatment, with slight concern for the process of letting her loved ones know. Pt reported that her faith is an important part of her coping resources along with family that she stays well connected with (2 grown children, 7 brothers and sisters). Pt and husband stated that they moved to [place] in June and are still building local supportive relationships (searching for a faith community).  Pt and husband reported interest in speaking to social workers and/or financial advocates about the cost of treatment.   Pt reported feeling a distress level of 1 prior to [facility] and a level of 0 at the time of our encournter and she reported feeling satisfied in terms of her informational concerns post clinic.  Counseling intern will facilitate connection to social workers and Mining engineer.  Follow up: call with counseling intern scheduled for 11/2.    Vaughan Sine Counseling Intern [phone]

## 2014-11-21 NOTE — Progress Notes (Signed)
REFERRING PROVIDER: Nicholas Lose, MD  PRIMARY PROVIDER:  Gerrit Heck, MD  PRIMARY REASON FOR VISIT:  1. Breast cancer of 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 breast ([place])   2. Family history of breast cancer in female   3. Family history of pancreatic cancer   4. Family history of testicular cancer   5. History of basal cell carcinoma   6. Family history of basal cell carcinoma   7. History of colonic polyps   8. Family history of colonic polyps      HISTORY OF PRESENT ILLNESS:   Valerie Snyder, a 64 y.o. female, was seen for a [facility] cancer genetics consultation at the request of Dr. Lindi Adie due to a personal and family history of breast cancer.  Valerie Snyder presents to clinic today with her husband to discuss the possibility of a hereditary predisposition to cancer, genetic testing, and to further clarify her future cancer risks, as well as potential cancer risks for family members.   In 2016, at the age of 47, Valerie Snyder was diagnosed with DCIS of the right breast. Hormone receptor status was ER/PR+.  Surgical date will be set pending return of genetic test results.    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  First live birth at age 65.  OCP use for approximately 26 years total  Ovaries intact: yes.  Hysterectomy: no.  Menopausal status: postmenopausal.  HRT use: 16 years. Colonoscopy: yes; history of pre-cancerous colon polyps of unknown number; last colonoscopy was in 2014; on a 3-year schedule, so will have another in 2017. Mammogram within the last year: yes. Number of breast  biopsies: 1. Up to date with pelvic exams:  yes. Any excessive radiation exposure in the past:  No, but reports some secondhand smoke exposure as child  Past Medical History  Diagnosis Date  . Breast cancer of 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 breast ([place]) 11/13/2014  . Thyroid disease   . H/O bilateral inguinal hernia repair 1992  . GERD (gastroesophageal reflux disease)   . Migraines   . Hypercholesteremia   . Allergy   . Asthma   . Plantar fasciitis, bilateral   . Spinal stenosis     Past Surgical History  Procedure Laterality Date  . Tonsilectomy/adenoidectomy with myringotomy    . Bladder suspension    . Removal of basal cell carcinoma (nose)    . Neuromas removed from right foot    . Nasal septum surgery      Social History   Social History  . Marital Status: Married    Spouse Name: N/A  . Number of Children: N/A  . Years of Education: N/A   Social History Main Topics  . Smoking status: Never Smoker   . Smokeless tobacco: Never Used  . Alcohol Use: No  . Drug Use: No  . Sexual Activity: Not Asked   Other Topics Concern  . None   Social History Narrative     FAMILY HISTORY:  We obtained a detailed, 4-generation family history.  Significant diagnoses are listed below: Family History  Problem Relation Age of Onset  . Pancreatic cancer Maternal Aunt  dx. 40s-early 44s  . Congestive Heart Failure Mother 60  . Lung disease Mother   . Colon polyps Mother     unspecified number  . Dementia Father     Lewy Body Dementia  . Multiple sclerosis Sister   . Aneurysm Maternal Grandmother     brain  . Heart attack Maternal Grandfather   . Emphysema Paternal Grandfather   . Heart Problems Paternal Grandfather   . Other Sister     3 sisters have history of cysts in uterus  . Breast cancer Cousin     dx. 45-50  . Testicular cancer Cousin     dx. 30s  . Colon polyps Daughter     unspecified number    Valerie Snyder has one daughter, age 93, and one  son, age 7.  Neither have had cancer, but her daughter has a history of colon polyps.  She has one granddaughter who is currently 32.  Valerie Snyder has five full sisters and two full brothers.  All of her siblings are currently alive and cancer-free, and are between the ages of 26-72.  She reports that three sisters have had cysts on their uterus.  Valerie Snyder mother died at 35 of CHF and lung disease.  Valerie Snyder's father died at 54 of a stroke following a fall.  Both of her parents have a positive history of colon polyps, but she is unsure how many they had.  Valerie Snyder father had four full brothers.  One brother died at 69 following an accident.  The other three brothers passed away in their 70s-80s.  One brother had a grandson who had testicular cancer in his 77s.  Another brother had two daughters and one son; the two daughters were diagnosed with breast cancer at 62 and between 20 and 8.  These cousins have since passed away, and Valerie Snyder is unaware as to whether they ever had genetic testing.  Valerie Snyder's maternal grandmother died at 26.  Her paternal grandfather died in his 26s from emphysema and heart problems.  She is unaware of any additional paternal relatives with cancer diagnoses.  Valerie Snyder mother had three full sisters and two full brothers.  One sister died of pancreatic cancer in her 40s-early 43s.  The other siblings passed away in their 65s-80s and never had cancer.  Valerie Snyder is unaware of any cancer diagnoses in her maternal first cousins.  Her maternal grandmother died at 72 of a brain aneurysm.  Her maternal grandfather died in his 74s-40s of a heart attack.  Valerie Snyder is unaware of any additional maternal relatives with cancer diagnoses.  Patient's maternal ancestors are of Polish/Russian descent, and paternal ancestors are of Italian/Greek descent. There is no reported Ashkenazi Jewish ancestry. There is no known consanguinity.  GENETIC  COUNSELING ASSESSMENT: Valerie Snyder is a 64 y.o. female with a personal and family history of breast cancer which is somewhat suggestive of a hereditary breast cancer syndrome and predisposition to cancer. We, therefore, discussed and recommended the following at today's visit.   DISCUSSION: We reviewed the characteristics, features and inheritance patterns of hereditary cancer syndromes. We also discussed genetic testing, including the appropriate family members to test, the process of testing, insurance coverage and turn-around-time for results. We discussed the implications of a negative, positive and/or variant of uncertain significant result. We recommended Valerie Snyder pursue genetic testing for the 20-gene Breast/Ovarian Cancer Panel through [facility] Hope Pigeon, MD).  The Breast/Ovarian Panel offered by [facility] includes sequencing  and deletion/duplication analysis for the following 19 genes:  ATM, BARD1, BRCA1, BRCA2, BRIP1, CDH1, CHEK2, FANCC, MLH1, MSH2, MSH6, NBN, PALB2, PMS2, PTEN, RAD51C, RAD51D, TP53, and XRCC2.  This panel also includes deletion/duplication analysis (without sequencing) for one gene, EPCAM.   Based on Valerie Snyder's personal and family history of cancer, she meets medical criteria for genetic testing. Despite that she meets criteria, she may still have an out of pocket cost. We discussed that if her out of pocket cost for testing is over $100, the laboratory will call and confirm whether she wants to proceed with testing.  If the out of pocket cost of testing is less than $100 she will be billed by the genetic testing laboratory.   PLAN: After considering the risks, benefits, and limitations, Valerie Snyder  provided informed consent to pursue genetic testing and the blood sample was sent to [facility] for analysis of the 20-gene Breast/Ovarian Cancer Panel. Results are generally available within approximately 2-3 weeks' time, however, since this  information will be used to help make important treatment/surgical decisions, we will ask that [facility] return these results to [place] STAT.  Thus, these results should be available closer to the two-week time frame, at which point they will be disclosed by telephone to Ms. Quintanilla, as will any additional recommendations warranted by these results. Ms. Ansley will receive a summary of her genetic counseling visit and a copy of her results once available. This information will also be available in Epic. We encouraged Ms. Andy to remain in contact with cancer genetics annually so that we can continuously update the family history and inform her of any changes in cancer genetics and testing that may be of benefit for her family. Ms. Derryberry questions were answered to her satisfaction today. Our contact information was provided should additional questions or concerns arise.  Thank you for the referral and allowing [place] to share in the care of your patient.   Jeanine Luz, MS Genetic Counselor [email or web address] Phone: [phone]  The patient was seen for a total of 65 minutes in face-to-face genetic counseling.  This patient was discussed with Drs. Magrinat, Lindi Adie and/or Burr Medico who agrees with the above.    _______________________________________________________________________ For Office Staff:  Number of people involved in session: 2 Was an Intern/ student involved with case: no

## 2014-11-25 ENCOUNTER — Telehealth: Payer: Self-pay | Admitting: *Deleted

## 2014-11-25 NOTE — Telephone Encounter (Signed)
Left message for a return phone call to follow up from [facility] 10/19. Awaiting patient response.

## 2014-11-28 ENCOUNTER — Telehealth: Payer: Self-pay | Admitting: Genetic Counselor

## 2014-11-28 ENCOUNTER — Ambulatory Visit: Payer: Self-pay | Admitting: Genetic Counselor

## 2014-11-28 DIAGNOSIS — Z1379 Encounter for other screening for genetic and chromosomal anomalies: Secondary | ICD-10-CM

## 2014-11-28 NOTE — Telephone Encounter (Signed)
Discussed with Ms. Salomone husband (who was with her at the genetic counseling appt) that her genetic test results were negative for mutations within any of 20 genes that would cause her to be at an increased risk for breast, ovarian, or other related cancers.  Additionally, no variants of uncertain significance (VUSes) were found.  We discussed that this result is even more reassuring considering the family history--that several family members have lived to later ages in life and have never had cancer.  We discussed that women in the family should still have annual mammogram screening.  They should begin at the age of 43, if they have not started yet.  Ms. Thedford husband voiced his understanding and took down my number so that Ms. Macqueen can call me with any further questions.

## 2014-11-28 NOTE — Telephone Encounter (Signed)
Discussed with Ms. Kreeger that her genetic testing was negative.  She is very happy with this news.

## 2014-12-05 ENCOUNTER — Telehealth: Payer: Self-pay | Admitting: *Deleted

## 2014-12-05 ENCOUNTER — Telehealth: Payer: Self-pay | Admitting: Hematology and Oncology

## 2014-12-05 ENCOUNTER — Encounter (HOSPITAL_BASED_OUTPATIENT_CLINIC_OR_DEPARTMENT_OTHER): Payer: Self-pay | Admitting: *Deleted

## 2014-12-05 ENCOUNTER — Other Ambulatory Visit: Payer: Self-pay | Admitting: General Surgery

## 2014-12-05 ENCOUNTER — Encounter: Payer: Self-pay | Admitting: *Deleted

## 2014-12-05 DIAGNOSIS — D0511 Intraductal carcinoma in situ of right breast: Secondary | ICD-10-CM

## 2014-12-05 NOTE — Telephone Encounter (Signed)
Left vm for pt to return call to discuss xrt start time.

## 2014-12-05 NOTE — Telephone Encounter (Signed)
s.w. pt and advised on NOV appt....pt ok and aware °

## 2014-12-08 ENCOUNTER — Ambulatory Visit (INDEPENDENT_AMBULATORY_CARE_PROVIDER_SITE_OTHER): Payer: 59

## 2014-12-08 DIAGNOSIS — J309 Allergic rhinitis, unspecified: Secondary | ICD-10-CM

## 2014-12-09 ENCOUNTER — Ambulatory Visit
Admission: RE | Admit: 2014-12-09 | Discharge: 2014-12-09 | Disposition: A | Discharge status: Home / Self Care | Payer: 59 | Source: Ambulatory Visit | Attending: General Surgery | Admitting: General Surgery

## 2014-12-09 DIAGNOSIS — D0511 Intraductal carcinoma in situ of right breast: Secondary | ICD-10-CM

## 2014-12-10 ENCOUNTER — Ambulatory Visit (HOSPITAL_BASED_OUTPATIENT_CLINIC_OR_DEPARTMENT_OTHER): Payer: 59 | Admitting: Anesthesiology

## 2014-12-10 ENCOUNTER — Ambulatory Visit
Admission: RE | Admit: 2014-12-10 | Discharge: 2014-12-10 | Disposition: A | Discharge status: Home / Self Care | Payer: 59 | Source: Ambulatory Visit | Attending: General Surgery | Admitting: General Surgery

## 2014-12-10 ENCOUNTER — Encounter (HOSPITAL_BASED_OUTPATIENT_CLINIC_OR_DEPARTMENT_OTHER)
Admission: RE | Disposition: A | Discharge status: Home / Self Care | Payer: Self-pay | Source: Ambulatory Visit | Attending: General Surgery

## 2014-12-10 ENCOUNTER — Encounter (HOSPITAL_BASED_OUTPATIENT_CLINIC_OR_DEPARTMENT_OTHER): Payer: Self-pay | Admitting: Anesthesiology

## 2014-12-10 ENCOUNTER — Ambulatory Visit (HOSPITAL_BASED_OUTPATIENT_CLINIC_OR_DEPARTMENT_OTHER)
Admission: RE | Admit: 2014-12-10 | Discharge: 2014-12-10 | Disposition: A | Discharge status: Home / Self Care | Payer: 59 | Source: Ambulatory Visit | Attending: General Surgery | Admitting: General Surgery

## 2014-12-10 DIAGNOSIS — E7800 Pure hypercholesterolemia, unspecified: Secondary | ICD-10-CM | POA: Diagnosis not present

## 2014-12-10 DIAGNOSIS — J45909 Unspecified asthma, uncomplicated: Secondary | ICD-10-CM | POA: Insufficient documentation

## 2014-12-10 DIAGNOSIS — Z803 Family history of malignant neoplasm of breast: Secondary | ICD-10-CM | POA: Diagnosis not present

## 2014-12-10 DIAGNOSIS — G43909 Migraine, unspecified, not intractable, without status migrainosus: Secondary | ICD-10-CM | POA: Diagnosis not present

## 2014-12-10 DIAGNOSIS — Z881 Allergy status to other antibiotic agents status: Secondary | ICD-10-CM | POA: Diagnosis not present

## 2014-12-10 DIAGNOSIS — D0511 Intraductal carcinoma in situ of right breast: Secondary | ICD-10-CM

## 2014-12-10 DIAGNOSIS — Z888 Allergy status to other drugs, medicaments and biological substances status: Secondary | ICD-10-CM | POA: Insufficient documentation

## 2014-12-10 DIAGNOSIS — K219 Gastro-esophageal reflux disease without esophagitis: Secondary | ICD-10-CM | POA: Diagnosis not present

## 2014-12-10 DIAGNOSIS — Z170 Estrogen receptor positive status [ER+]: Secondary | ICD-10-CM | POA: Insufficient documentation

## 2014-12-10 DIAGNOSIS — Z85820 Personal history of malignant melanoma of skin: Secondary | ICD-10-CM | POA: Diagnosis not present

## 2014-12-10 DIAGNOSIS — Z1379 Encounter for other screening for genetic and chromosomal anomalies: Secondary | ICD-10-CM | POA: Insufficient documentation

## 2014-12-10 HISTORY — PX: BREAST LUMPECTOMY WITH RADIOACTIVE SEED LOCALIZATION: SHX6424

## 2014-12-10 HISTORY — PX: BREAST LUMPECTOMY: SHX2

## 2014-12-10 SURGERY — BREAST LUMPECTOMY WITH RADIOACTIVE SEED LOCALIZATION
Anesthesia: General | Site: Breast | Laterality: Right

## 2014-12-10 MED ORDER — CHLORHEXIDINE GLUCONATE 4 % EX LIQD: 1.0000 "application " | Freq: Once | CUTANEOUS | Status: DC

## 2014-12-10 MED ORDER — ONDANSETRON HCL 4 MG/2ML IJ SOLN
INTRAMUSCULAR | Status: DC | PRN
  Administered 2014-12-10: 4 mg via INTRAVENOUS

## 2014-12-10 MED ORDER — DEXAMETHASONE SODIUM PHOSPHATE 4 MG/ML IJ SOLN
INTRAMUSCULAR | Status: DC | PRN
  Administered 2014-12-10: 10 mg via INTRAVENOUS

## 2014-12-10 MED ORDER — BUPIVACAINE HCL (PF) 0.25 % IJ SOLN
INTRAMUSCULAR | Status: AC
  Filled 2014-12-10: qty 30

## 2014-12-10 MED ORDER — SUCCINYLCHOLINE CHLORIDE 20 MG/ML IJ SOLN
INTRAMUSCULAR | Status: AC
  Filled 2014-12-10: qty 1

## 2014-12-10 MED ORDER — SCOPOLAMINE 1 MG/3DAYS TD PT72: 1.0000 | MEDICATED_PATCH | Freq: Once | TRANSDERMAL | Status: DC | PRN

## 2014-12-10 MED ORDER — HYDROMORPHONE HCL 1 MG/ML IJ SOLN: 0.2500 mg | INTRAMUSCULAR | Status: DC | PRN

## 2014-12-10 MED ORDER — VANCOMYCIN HCL IN DEXTROSE 1-5 GM/200ML-% IV SOLN
1000.0000 mg | INTRAVENOUS | Status: AC
Start: 2014-12-10 — End: 2014-12-10
  Administered 2014-12-10: 1000 mg via INTRAVENOUS

## 2014-12-10 MED ORDER — ONDANSETRON HCL 4 MG/2ML IJ SOLN: 4.0000 mg | Freq: Four times a day (QID) | INTRAMUSCULAR | Status: DC | PRN

## 2014-12-10 MED ORDER — OXYCODONE-ACETAMINOPHEN 5-325 MG PO TABS: 1.0000 | ORAL_TABLET | ORAL | Status: DC | PRN

## 2014-12-10 MED ORDER — BUPIVACAINE HCL (PF) 0.25 % IJ SOLN
INTRAMUSCULAR | Status: DC | PRN
  Administered 2014-12-10: 16 mL

## 2014-12-10 MED ORDER — MIDAZOLAM HCL 2 MG/2ML IJ SOLN
INTRAMUSCULAR | Status: AC
  Filled 2014-12-10: qty 4

## 2014-12-10 MED ORDER — FENTANYL CITRATE (PF) 100 MCG/2ML IJ SOLN: 50.0000 ug | INTRAMUSCULAR | Status: DC | PRN

## 2014-12-10 MED ORDER — DEXAMETHASONE SODIUM PHOSPHATE 10 MG/ML IJ SOLN
INTRAMUSCULAR | Status: AC
  Filled 2014-12-10: qty 1

## 2014-12-10 MED ORDER — PROPOFOL 10 MG/ML IV BOLUS
INTRAVENOUS | Status: AC
  Filled 2014-12-10: qty 20

## 2014-12-10 MED ORDER — ATROPINE SULFATE 0.4 MG/ML IJ SOLN
INTRAMUSCULAR | Status: AC
  Filled 2014-12-10: qty 1

## 2014-12-10 MED ORDER — PHENYLEPHRINE HCL 10 MG/ML IJ SOLN
INTRAMUSCULAR | Status: AC
  Filled 2014-12-10: qty 1

## 2014-12-10 MED ORDER — OXYCODONE HCL 5 MG PO TABS
5.0000 mg | ORAL_TABLET | Freq: Once | ORAL | Status: AC | PRN
  Administered 2014-12-10: 5 mg via ORAL

## 2014-12-10 MED ORDER — BUPIVACAINE-EPINEPHRINE (PF) 0.25% -1:200000 IJ SOLN
INTRAMUSCULAR | Status: AC
  Filled 2014-12-10: qty 30

## 2014-12-10 MED ORDER — LIDOCAINE HCL (CARDIAC) 20 MG/ML IV SOLN
INTRAVENOUS | Status: DC | PRN
  Administered 2014-12-10: 50 mg via INTRAVENOUS

## 2014-12-10 MED ORDER — FENTANYL CITRATE (PF) 100 MCG/2ML IJ SOLN
INTRAMUSCULAR | Status: DC | PRN
  Administered 2014-12-10 (×2): 25 ug via INTRAVENOUS
  Administered 2014-12-10: 100 ug via INTRAVENOUS

## 2014-12-10 MED ORDER — FENTANYL CITRATE (PF) 100 MCG/2ML IJ SOLN
INTRAMUSCULAR | Status: AC
  Filled 2014-12-10: qty 4

## 2014-12-10 MED ORDER — MIDAZOLAM HCL 5 MG/5ML IJ SOLN
INTRAMUSCULAR | Status: DC | PRN
  Administered 2014-12-10: 2 mg via INTRAVENOUS

## 2014-12-10 MED ORDER — MIDAZOLAM HCL 2 MG/2ML IJ SOLN: 1.0000 mg | INTRAMUSCULAR | Status: DC | PRN

## 2014-12-10 MED ORDER — EPHEDRINE SULFATE 50 MG/ML IJ SOLN
INTRAMUSCULAR | Status: AC
  Filled 2014-12-10: qty 1

## 2014-12-10 MED ORDER — VANCOMYCIN HCL IN DEXTROSE 1-5 GM/200ML-% IV SOLN
INTRAVENOUS | Status: AC
  Filled 2014-12-10: qty 200

## 2014-12-10 MED ORDER — LACTATED RINGERS IV SOLN
INTRAVENOUS | Status: DC
Start: 2014-12-10 — End: 2014-12-10
  Administered 2014-12-10: 11:00:00 via INTRAVENOUS

## 2014-12-10 MED ORDER — GLYCOPYRROLATE 0.2 MG/ML IJ SOLN
INTRAMUSCULAR | Status: AC
  Filled 2014-12-10: qty 1

## 2014-12-10 MED ORDER — GLYCOPYRROLATE 0.2 MG/ML IJ SOLN: 0.2000 mg | Freq: Once | INTRAMUSCULAR | Status: DC | PRN

## 2014-12-10 MED ORDER — ONDANSETRON HCL 4 MG/2ML IJ SOLN
INTRAMUSCULAR | Status: AC
Start: 2014-12-10 — End: 2014-12-10
  Filled 2014-12-10: qty 2

## 2014-12-10 MED ORDER — PROPOFOL 10 MG/ML IV BOLUS
INTRAVENOUS | Status: DC | PRN
  Administered 2014-12-10: 30 mg via INTRAVENOUS
  Administered 2014-12-10: 180 mg via INTRAVENOUS

## 2014-12-10 MED ORDER — OXYCODONE HCL 5 MG PO TABS
ORAL_TABLET | ORAL | Status: AC
  Filled 2014-12-10: qty 1

## 2014-12-10 MED ORDER — OXYCODONE HCL 5 MG/5ML PO SOLN: 5.0000 mg | Freq: Once | ORAL | Status: AC | PRN

## 2014-12-10 MED ORDER — LIDOCAINE HCL (CARDIAC) 20 MG/ML IV SOLN
INTRAVENOUS | Status: AC
  Filled 2014-12-10: qty 5

## 2014-12-10 SURGICAL SUPPLY — 36 items
APPLIER CLIP 9.375 MED OPEN (MISCELLANEOUS)
BLADE SURG 15 STRL LF DISP TIS (BLADE) ×1 IMPLANT
BLADE SURG 15 STRL SS (BLADE) ×1
CANISTER SUC SOCK COL 7IN (MISCELLANEOUS) ×2 IMPLANT
CANISTER SUCT 1200ML W/VALVE (MISCELLANEOUS) ×2 IMPLANT
CHLORAPREP W/TINT 26ML (MISCELLANEOUS) ×2 IMPLANT
CLIP APPLIE 9.375 MED OPEN (MISCELLANEOUS) IMPLANT
COVER BACK TABLE 60X90IN (DRAPES) ×2 IMPLANT
COVER MAYO STAND STRL (DRAPES) ×2 IMPLANT
COVER PROBE W GEL 5X96 (DRAPES) ×2 IMPLANT
DECANTER SPIKE VIAL GLASS SM (MISCELLANEOUS) IMPLANT
DEVICE DUBIN W/COMP PLATE 8390 (MISCELLANEOUS) ×2 IMPLANT
DRAPE LAPAROSCOPIC ABDOMINAL (DRAPES) IMPLANT
DRAPE UTILITY XL STRL (DRAPES) ×2 IMPLANT
ELECT COATED BLADE 2.86 ST (ELECTRODE) ×2 IMPLANT
ELECT REM PT RETURN 9FT ADLT (ELECTROSURGICAL) ×2
ELECTRODE REM PT RTRN 9FT ADLT (ELECTROSURGICAL) ×1 IMPLANT
GLOVE BIO SURGEON STRL SZ7.5 (GLOVE) ×4 IMPLANT
GOWN STRL REUS W/ TWL LRG LVL3 (GOWN DISPOSABLE) ×2 IMPLANT
GOWN STRL REUS W/TWL LRG LVL3 (GOWN DISPOSABLE) ×2
KIT MARKER MARGIN INK (KITS) ×2 IMPLANT
LIQUID BAND (GAUZE/BANDAGES/DRESSINGS) ×2 IMPLANT
NEEDLE HYPO 25X1 1.5 SAFETY (NEEDLE) IMPLANT
NS IRRIG 1000ML POUR BTL (IV SOLUTION) IMPLANT
PACK BASIN DAY SURGERY FS (CUSTOM PROCEDURE TRAY) ×2 IMPLANT
PENCIL BUTTON HOLSTER BLD 10FT (ELECTRODE) ×2 IMPLANT
SLEEVE SCD COMPRESS KNEE MED (MISCELLANEOUS) ×2 IMPLANT
SPONGE LAP 18X18 X RAY DECT (DISPOSABLE) ×2 IMPLANT
SUT MON AB 4-0 PC3 18 (SUTURE) IMPLANT
SUT SILK 2 0 SH (SUTURE) IMPLANT
SUT VICRYL 3-0 CR8 SH (SUTURE) ×2 IMPLANT
SYR CONTROL 10ML LL (SYRINGE) IMPLANT
TOWEL OR 17X24 6PK STRL BLUE (TOWEL DISPOSABLE) ×2 IMPLANT
TOWEL OR NON WOVEN STRL DISP B (DISPOSABLE) ×2 IMPLANT
TUBE CONNECTING 20X1/4 (TUBING) ×2 IMPLANT
YANKAUER SUCT BULB TIP NO VENT (SUCTIONS) IMPLANT

## 2014-12-10 NOTE — Anesthesia Procedure Notes (Signed)
Procedure Name: LMA Insertion Date/Time: 12/10/2014 11:34 AM Performed by: Toula Moos L Pre-anesthesia Checklist: Patient identified, Emergency Drugs available, Suction available, Patient being monitored and Timeout performed Patient Re-evaluated:Patient Re-evaluated prior to inductionOxygen Delivery Method: Circle System Utilized Preoxygenation: Pre-oxygenation with 100% oxygen Intubation Type: IV induction Ventilation: Mask ventilation without difficulty LMA: LMA inserted LMA Size: 4.0 Number of attempts: 1 Airway Equipment and Method: Bite block Placement Confirmation: positive ETCO2 Tube secured with: Tape Dental Injury: Teeth and Oropharynx as per pre-operative assessment

## 2014-12-10 NOTE — Discharge Instructions (Signed)

## 2014-12-10 NOTE — Anesthesia Postprocedure Evaluation (Signed)
Anesthesia Post Note  Patient: Valerie Snyder  Procedure(s) Performed: Procedure(s) (LRB): RIGHT BREAST LUMPECTOMY WITH RADIOACTIVE SEED LOCALIZATION (Right)  Anesthesia type: General  Patient location: PACU  Post pain: Pain level controlled and Adequate analgesia  Post assessment: Post-op Vital signs reviewed, Patient's Cardiovascular Status Stable, Respiratory Function Stable, Patent Airway and Pain level controlled  Last Vitals:  Filed Vitals:   12/10/14 1329  BP: 107/51  Pulse: 69  Temp: 36.8 C  Resp: 18    Post vital signs: Reviewed and stable  Level of consciousness: awake, alert  and oriented  Complications: No apparent anesthesia complications

## 2014-12-10 NOTE — H&P (Signed)
Valerie Snyder 11/19/2014 7:59 AM Location: [facility] Patient #: [id number] DOB: Jun 08, 1950 Undefined / Language: Valerie Snyder / Race: White Female   History of Present Illness Sammuel Hines. Marlou Starks MD; 11/19/2014 3:37 PM) The patient is a 64 year old female who presents with breast cancer. We are asked to see the patient in consultation by Dr. Hassan Rowan to evaluate her for right breast DCIS. The patient is a 64 year old white female who recently went for a routine screening mammogram. At that time she was found to have a 4 mm area of calcification in the upper inner quadrant of the right breast. 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 She was ER and PR positive. She does complain of chronic bilateral breast pain. She has not had any discharge from the nipple. She does not smoke and does not take hormone replacement therapy.   Other Problems Conni Slipper, RN; 11/19/2014 7:59 AM) Arthritis Asthma Back Pain Bladder Problems Breast Cancer Diverticulosis Gastroesophageal Reflux Disease General anesthesia - complications Hypercholesterolemia Kidney Stone Lump In Breast Melanoma Migraine Headache Thyroid Disease  Past Surgical History Conni Slipper, RN; 11/19/2014 7:59 AM) Breast Biopsy Right. Colon Polyp Removal - Colonoscopy Foot Surgery Bilateral. Laparoscopic Inguinal Hernia Surgery Bilateral. Oral Surgery Tonsillectomy  Diagnostic Studies History Conni Slipper, RN; 11/19/2014 7:59 AM) Colonoscopy 1-5 years ago Mammogram within last year Pap Smear 1-5 years ago  Medication History Conni Slipper, RN; 11/19/2014 7:59 AM) No Current Medications Medications Reconciled  Social History Conni Slipper, RN; 11/19/2014 7:59 AM) No alcohol use No caffeine use No drug use Tobacco use Never smoker.  Family History Conni Slipper, RN; 11/19/2014 7:59 AM) Anesthetic complications Father. Arthritis Father, Mother, Sister. Breast Cancer Family  Members In General. Cancer Family Members In General. Cerebrovascular Accident Father. Colon Polyps Father, Mother. Depression Brother, Sister. Heart Disease Mother, Sister. Hypertension Mother, Sister. Malignant Neoplasm Of Pancreas Family Members In General. Migraine Headache Sister. Respiratory Condition Mother.  Pregnancy / Birth History Conni Slipper, RN; 11/19/2014 7:59 AM) Age at menarche 55 years. Age of menopause 51-55 Contraceptive History Oral contraceptives. Gravida 2 Irregular periods Maternal age 25-25 Para 2    Review of Systems Conni Slipper RN; 11/19/2014 7:59 AM) General Present- Fatigue. Not Present- Appetite Loss, Chills, Fever, Night Sweats, Weight Gain and Weight Loss. Skin Not Present- Change in Wart/Mole, Dryness, Hives, Jaundice, New Lesions, Non-Healing Wounds, Rash and Ulcer. HEENT Present- Hoarseness, Seasonal Allergies and Wears glasses/contact lenses. Not Present- Earache, Hearing Loss, Nose Bleed, Oral Ulcers, Ringing in the Ears, Sinus Pain, Sore Throat, Visual Disturbances and Yellow Eyes. Respiratory Not Present- Bloody sputum, Chronic Cough, Difficulty Breathing, Snoring and Wheezing. Breast Present- Breast Pain. Not Present- Breast Mass, Nipple Discharge and Skin Changes. Cardiovascular Present- Leg Cramps. Not Present- Chest Pain, Difficulty Breathing Lying Down, Palpitations, Rapid Heart Rate, Shortness of Breath and Swelling of Extremities. Gastrointestinal Present- Abdominal Pain. Not Present- Bloating, Bloody Stool, Change in Bowel Habits, Chronic diarrhea, Constipation, Difficulty Swallowing, Excessive gas, Gets full quickly at meals, Hemorrhoids, Indigestion, Nausea, Rectal Pain and Vomiting. Female Genitourinary Present- Pelvic Pain and Urgency. Not Present- Frequency, Nocturia and Painful Urination. Musculoskeletal Present- Joint Pain and Joint Stiffness. Not Present- Back Pain, Muscle Pain, Muscle Weakness and Swelling of  Extremities. Neurological Not Present- Decreased Memory, Fainting, Headaches, Numbness, Seizures, Tingling, Tremor, Trouble walking and Weakness. Endocrine Present- Hair Changes. Not Present- Cold Intolerance, Excessive Hunger, Heat Intolerance, Hot flashes and New Diabetes. Hematology Not Present- Easy Bruising, Excessive bleeding, Gland problems, HIV and Persistent Infections.  Physical Exam Eddie Dibbles S. Marlou Starks MD; 11/19/2014 3:38 PM) General Mental Status-Alert. General Appearance-Consistent with stated age. Hydration-Well hydrated. Voice-Normal.  Head and Neck Head-normocephalic, atraumatic with no lesions or palpable masses. Trachea-midline. Thyroid Gland Characteristics - normal size and consistency.  Eye Eyeball - Bilateral-Extraocular movements intact. Sclera/Conjunctiva - Bilateral-No scleral icterus.  Chest and Lung Exam Chest and lung exam reveals -quiet, even and easy respiratory effort with no use of accessory muscles and on auscultation, normal breath sounds, no adventitious sounds and normal vocal resonance. Inspection Chest Wall - Normal. Back - normal.  Breast Note: There is bilateral breast tenderness. There is no palpable mass in either breast. There is no palpable axillary, supraclavicular, or cervical lymphadenopathy.   Cardiovascular Cardiovascular examination reveals -normal heart sounds, regular rate and rhythm with no murmurs and normal pedal pulses bilaterally.  Abdomen Inspection Inspection of the abdomen reveals - No Hernias. Skin - Scar - no surgical scars. Palpation/Percussion Palpation and Percussion of the abdomen reveal - Soft, Non Tender, No Rebound tenderness, No Rigidity (guarding) and No hepatosplenomegaly. Auscultation Auscultation of the abdomen reveals - Bowel sounds normal.  Neurologic Neurologic evaluation reveals -alert and oriented x 3 with no impairment of recent or remote memory. Mental  Status-Normal.  Musculoskeletal Normal Exam - Left-Upper Extremity Strength Normal and Lower Extremity Strength Normal. Normal Exam - Right-Upper Extremity Strength Normal and Lower Extremity Strength Normal. Note: There is some swelling tenderness and redness of the left wrist   Lymphatic Head & Neck  General Head & Neck Lymphatics: Bilateral - Description - Normal. Axillary  General Axillary Region: Bilateral - Description - Normal. Tenderness - Non Tender. Femoral & Inguinal  Generalized Femoral & Inguinal Lymphatics: Bilateral - Description - Normal. Tenderness - Non Tender.    Assessment & Plan Eddie Dibbles S. Marlou Starks MD; 11/19/2014 3:43 PM) DCIS (DUCTAL CARCINOMA IN SITU), RIGHT (D05.11) Impression: The patient appears to have a small area of DCIS in the upper inner quadrant of the right breast. I have talked to her in detail about the different options for treatment and at this point she favors breast conservation. I think this is a very reasonable way of treating her breast cancer. I have discussed with her in detail the risks and benefits of the operation to remove this area as well as some of the technical aspects and she understands and wishes to proceed. She will require a right breast radioactive seed localized lumpectomy. She will not need a node evaluation. Current Plans Pt Education - Breast Cancer: discussed with patient and provided information.   Signed by Luella Cook, MD (11/19/2014 3:44 PM)

## 2014-12-10 NOTE — Anesthesia Preprocedure Evaluation (Signed)
Anesthesia Evaluation  Patient identified by MRN, date of birth, ID band Patient awake    Reviewed: Allergy & Precautions, NPO status , Patient's Chart, lab work & pertinent test results  Airway Mallampati: II   Neck ROM: full    Dental   Pulmonary asthma ,    breath sounds clear to auscultation       Cardiovascular negative cardio ROS   Rhythm:regular Rate:Normal     Neuro/Psych  Headaches,    GI/Hepatic GERD  ,  Endo/Other    Renal/GU      Musculoskeletal   Abdominal   Peds  Hematology   Anesthesia Other Findings   Reproductive/Obstetrics Breast CA.                             Anesthesia Physical Anesthesia Plan  ASA: II  Anesthesia Plan: General   Post-op Pain Management:    Induction: Intravenous  Airway Management Planned: LMA  Additional Equipment:   Intra-op Plan:   Post-operative Plan:   Informed Consent: I have reviewed the patients History and Physical, chart, labs and discussed the procedure including the risks, benefits and alternatives for the proposed anesthesia with the patient or authorized representative who has indicated his/her understanding and acceptance.     Plan Discussed with: CRNA, Anesthesiologist and Surgeon  Anesthesia Plan Comments:         Anesthesia Quick Evaluation

## 2014-12-10 NOTE — Interval H&P Note (Signed)
History and Physical Interval Note:  12/10/2014 7:23 AM  Valerie Snyder  has presented today for surgery, with the diagnosis of RIGHT BREAST DCIS  The various methods of treatment have been discussed with the patient and family. After consideration of risks, benefits and other options for treatment, the patient has consented to  Procedure(s): RIGHT BREAST LUMPECTOMY WITH RADIOACTIVE SEED LOCALIZATION (Right) as a surgical intervention .  The patient's history has been reviewed, patient examined, no change in status, stable for surgery.  I have reviewed the patient's chart and labs.  Questions were answered to the patient's satisfaction.     TOTH III,Nedra Mcinnis S

## 2014-12-10 NOTE — Transfer of Care (Signed)
Immediate Anesthesia Transfer of Care Note  Patient: Valerie Snyder  Procedure(s) Performed: Procedure(s): RIGHT BREAST LUMPECTOMY WITH RADIOACTIVE SEED LOCALIZATION (Right)  Patient Location: PACU  Anesthesia Type:General  Level of Consciousness: sedated  Airway & Oxygen Therapy: Patient Spontanous Breathing and Patient connected to face mask oxygen  Post-op Assessment: Report given to RN and Post -op Vital signs reviewed and stable  Post vital signs: Reviewed and stable  Last Vitals:  Filed Vitals:   12/10/14 1231  BP:   Pulse: 85  Temp:   Resp:     Complications: No apparent anesthesia complications

## 2014-12-10 NOTE — Op Note (Signed)
12/10/2014  12:20 PM  PATIENT:  Valerie Snyder  64 y.o. female  PRE-OPERATIVE DIAGNOSIS:  RIGHT BREAST DCIS  POST-OPERATIVE DIAGNOSIS:  RIGHT BREAST DCIS  PROCEDURE:  Procedure(s): RIGHT BREAST LUMPECTOMY WITH RADIOACTIVE SEED LOCALIZATION (Right)  SURGEON:  Surgeon(s) and Role:    * Jovita Kussmaul, MD - Primary  PHYSICIAN ASSISTANT:   ASSISTANTS: none   ANESTHESIA:   general  EBL:     BLOOD ADMINISTERED:none  DRAINS: none   LOCAL MEDICATIONS USED:  MARCAINE     SPECIMEN:  Source of Specimen:  right breast tissue  DISPOSITION OF SPECIMEN:  PATHOLOGY  COUNTS:  YES  TOURNIQUET:  * No tourniquets in log *  DICTATION: .Dragon Dictation   After informed consent was obtained, the patient was brought to the operating room and placed in the supine position on the operating room table. After adequate induction of general anesthesia, the patient's right breast was prepped with chloroprep, allowed to dry, and draped in the ususal sterile manner. Previously an I125 seed was placed in the upper inner quadrant of the right breast to mark an area of DCIS. The neoprobe was set to I125 and the area of radioactivity was readily identified. An elliptical incision was made in the skin overlying the area of radioactivity with a 15 blade knife. The incision was carried through the skin and subcutaneous tissue sharply with the electrocautery. While checking the area of radioactivity frequently with the neoprobe a circular portion of breast tissue was excised sharply around the radioactive seed. The Dissection was carried from the skin to the muscle the chest wall. Once the specimen was removed it was oriented with the appropriate paint colors. A specimen radiograph was then obtained that showed the clip and seen to be in the center of the specimen. The specimen was then sent to pathology for further evaluation. Hemostasis was achieved using the Bovie electrocautery. The wound was irrigated with  saline and infiltrated with quarter percent Marcaine. The deep layer of the wound was then closed with interrupted layers of 3-0 Vicryl stitches. The skin was then closed with interrupted 4-0 Monocryl subcuticular stitches. Dermabond dressings were applied. The patient tolerated the procedure well. At the end of the case all needle sponge and instrument counts were correct. The patient was then awakened and taken to recovery in stable condition.  PLAN OF CARE: Discharge to home after PACU  PATIENT DISPOSITION:  PACU - hemodynamically stable.   Delay start of Pharmacological VTE agent (>24hrs) due to surgical blood loss or risk of bleeding: not applicable

## 2014-12-10 NOTE — Progress Notes (Signed)
GENETIC TEST RESULT  HPI: Valerie Snyder was previously seen in the [place] clinic due to a personal and family history of breast cancer and concerns regarding a hereditary predisposition to cancer. Please refer to our prior cancer genetics clinic note from November 21, 2014 for more information regarding Valerie Snyder's medical, social and family histories, and our assessment and recommendations, at the time. Valerie Snyder recent genetic test results were disclosed to her, as were recommendations warranted by these results. These results and recommendations are discussed in more detail below.  GENETIC TEST RESULTS: At the time of Valerie Snyder's visit on 11/21/14, we recommended she pursue genetic testing of the 20-gene Breast/Ovarian Cancer Panel through [facility] Hope Pigeon, MD).  The Breast/Ovarian Cancer Panel offered by [facility] includes sequencing and deletion/duplication analysis for the following 19 genes:  ATM, BARD1, BRCA1, BRCA2, BRIP1, CDH1, CHEK2, FANCC, MLH1, MSH2, MSH6, NBN, PALB2, PMS2, PTEN, RAD51C, RAD51D, TP53, and XRCC2.  This panel also includes deletion/duplication analysis (without sequencing) for one gene, EPCAM.  Those results are now back, the report date for which is November 28, 2014.  Genetic testing was normal, and did not reveal a deleterious mutation in these genes.  Additionally, no variants of uncertain significance (VUSes) were found.  The test report will be scanned into EPIC and will be located under the Results Review tab in the Pathology>Molecular Pathology section.   We discussed with Valerie Snyder that since the current genetic testing is not perfect, it is possible there may be a gene mutation in one of these genes that current testing cannot detect, but that chance is small. We also discussed, that it is possible that another gene that has not yet been discovered, or that we have not yet tested, is responsible for the cancer diagnoses  in the family, and it is, therefore, important to remain in touch with cancer genetics in the future so that we can continue to offer Valerie Snyder the most up-to-date genetic testing.   CANCER SCREENING RECOMMENDATIONS: This result is reassuring and indicates that Valerie Snyder likely does not have an increased risk for a future cancer due to a mutation in one of these genes. This normal test also suggests that Valerie Snyder cancer was most likely not due to an inherited predisposition associated with one of these genes.  Most cancers happen by chance and this negative test suggests that her cancer falls into this category.  Additionally, several members of Valerie Snyder family have lived to later ages in life and have never had cancer.  We, therefore, recommended she continue to follow the cancer management and screening guidelines provided by her oncology and primary healthcare providers.   RECOMMENDATIONS FOR FAMILY MEMBERS: Women in this family might be at some increased risk of developing cancer, over the general population risk, simply due to the family history of cancer. We recommended women in this family have a yearly mammogram beginning at age 11, or 78 years younger than the earliest onset of cancer, an an annual clinical breast exam, and perform monthly breast self-exams.  Thus, Valerie Snyder's daughters and nieces should begin annual mammogram screening at the age of 88.  Women in this family should also have a gynecological exam as recommended by their primary provider. All family members should have a colonoscopy by age 37.  FOLLOW-UP: Lastly, we discussed with Valerie Snyder that cancer genetics is a rapidly advancing field and it is possible that new genetic tests will be appropriate for her and/or  her family members in the future. We encouraged her to remain in contact with cancer genetics on an annual basis so we can update her personal and family histories and let her know of advances  in cancer genetics that may benefit this family.   Our contact number was provided. Valerie Snyder questions were answered to her satisfaction, and she knows she is welcome to call us at anytime with additional questions or concerns.   Jeanine Luz, MS Genetic Counselor [email or web address] Phone: [phone]

## 2014-12-11 ENCOUNTER — Encounter (HOSPITAL_BASED_OUTPATIENT_CLINIC_OR_DEPARTMENT_OTHER): Payer: Self-pay | Admitting: General Surgery

## 2014-12-22 NOTE — Assessment & Plan Note (Signed)
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 Rt Lumpectomy 12/10/14: Benign  Recommendation: 1. Adj XRT 2. Followed by antiestrogen therapy with tamoxifen vs anastrozole 5 years  RTC after XRT

## 2014-12-22 NOTE — Progress Notes (Signed)
Location of Breast Cancer: Breast cancer of 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 breast   Histology per Pathology Report:  12/10/14 Diagnosis Breast, lumpectomy, right - BENIGN BREAST TISSUE, SEE COMMENT. - NEGATIVE FOR ATYPIA OR MALIGNANCY. - PREVIOUS BIOPSY SITE IDENTIFIED  11/11/14 Diagnosis Breast, right, needle core biopsy, upper outer quadrant LOW GRADE DUCTAL CARCINOMA IN SITU INVOLVING INTRADUCTAL PAPILLOMA AND CALCIFICATION.  Receptor Status: ER(100%), PR (100%), Her2-neu ()  Did patient present with symptoms (if so, please note symptoms) or was this found on screening mammography?: screening mammogram  Past/Anticipated interventions by surgeon, if any: 12/10/14 - Procedure: RIGHT BREAST LUMPECTOMY WITH RADIOACTIVE SEED LOCALIZATION;  Surgeon: Autumn Messing III, MD;  Location: [facility];  Service: General;  Laterality: Right;  Past/Anticipated interventions by medical oncology, if any: antiestrogen therapy to follow radiation.  Lymphedema issues, if any:  no  Pain issues, if any:  yes reports intermittent pulling and discomfort in her right breast.  Also has some sharp pain in her left breast.   Gyn history: She menarched at early age of 51. She had 2 pregnancies, her first child was born at age 38, She has not received birth control pills. She was exposed to hormone replacement therapy for 16 years. She has family history of Breast/GYN/GI cancer. 2 paternal cousins diagnosed with breast cancer age 41 and 81  SAFETY ISSUES:  Prior radiation? no  Pacemaker/ICD? no  Possible current pregnancy?no  Is the patient on methotrexate? no  Current Complaints / other details:  Patient is here with her husband.   BP 111/41 mmHg  Pulse 72  Temp(Src) 97.9 F (36.6 C) (Oral)  Resp 18  Ht $R'5\' 5"'Dz$  (1.651 m)  Wt 146 lb 14.4 oz (66.633 kg)  BMI 24.45 kg/m2  SpO2 100%

## 2014-12-23 ENCOUNTER — Encounter: Payer: Self-pay | Admitting: Hematology and Oncology

## 2014-12-23 ENCOUNTER — Ambulatory Visit (HOSPITAL_BASED_OUTPATIENT_CLINIC_OR_DEPARTMENT_OTHER): Payer: 59 | Admitting: Hematology and Oncology

## 2014-12-23 VITALS — BP 135/54 | HR 93 | Temp 98.2°F | Resp 18 | Ht 65.0 in | Wt 146.6 lb

## 2014-12-23 DIAGNOSIS — C50211 Malignant neoplasm of 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 breast: Secondary | ICD-10-CM | POA: Diagnosis not present

## 2014-12-23 MED ORDER — TAMOXIFEN CITRATE 20 MG PO TABS: 20.0000 mg | ORAL_TABLET | Freq: Every day | ORAL | Status: DC

## 2014-12-23 NOTE — Progress Notes (Signed)
Patient Care Team: Leighton Ruff, MD as PCP - General (Family Medicine) Autumn Messing III, MD as Consulting Physician (General Surgery) Nicholas Lose, MD as Consulting Physician (Hematology and Oncology) Gery Pray, MD as Consulting Physician (Radiation Oncology) Mauro Kaufmann, RN as Registered Nurse Rockwell Germany, RN as Registered Nurse  DIAGNOSIS: Breast cancer of 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 breast [facility])   Staging form: Breast, AJCC 7th Edition     Clinical stage from 11/19/2014: Stage 0 (Tis (DCIS), N0, M0) - Unsigned   SUMMARY OF ONCOLOGIC HISTORY:   Breast cancer of 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 breast ([place])   11/06/2014 Mammogram Right breast indeterminate calcifications upper inner right breast middle depth 0.4 x 0.4 x 0.3 cm   11/11/2014 Initial Diagnosis Right breast biopsy upper outer quadrant: 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 Final pathology came back as benign breast tissue in the right breast. There was no evidence of DCIS. So it appears that the DCIS was removed through the initial biopsy. She is recovering very well from surgery and is reporting no major problems or concerns  REVIEW OF SYSTEMS:   Constitutional: Denies fevers, chills or abnormal weight loss Eyes: Denies blurriness of vision Ears, nose, mouth, throat, and face: Denies mucositis or sore throat Respiratory: Denies cough, dyspnea or wheezes Cardiovascular: Denies palpitation, chest discomfort or lower extremity swelling Gastrointestinal:  Denies nausea, heartburn or change in bowel habits Skin: Denies abnormal skin rashes Lymphatics: Denies new  lymphadenopathy or easy bruising Neurological:Denies numbness, tingling or new weaknesses Behavioral/Psych: Mood is stable, no new changes  Breast: recent surgery with lumpectomy All other systems were reviewed with the patient and are negative.  I have reviewed the past medical history, past surgical history, social history and family history with the patient and they are unchanged from previous note.  ALLERGIES:  is allergic to keflex and toradol.  MEDICATIONS:  Current Outpatient Prescriptions  Medication Sig Dispense Refill  . albuterol (PROVENTIL HFA;VENTOLIN HFA) 108 (90 BASE) MCG/ACT inhaler Inhale into the lungs every 6 (six) hours as needed for wheezing or shortness of breath.    . calcium carbonate (CALCIUM 600) 1500 (600 CA) MG TABS tablet Take by mouth 2 (two) times daily with a meal.    . cetirizine (ZYRTEC) 10 MG tablet Take 10 mg by mouth daily.    . Cholecalciferol (D-3-5) 5000 UNITS capsule Take 5,000 Units by mouth daily.    . Coenzyme Q10 (CO Q-10) 100 MG CAPS Take by mouth.    . Cranberry 125 MG TABS Take by mouth.    . Cranberry-Vitamin C-Vitamin E (CRANBERRY PLUS VITAMIN C) 4200-20-3 MG-MG-UNIT CAPS Take by mouth.    . EPINEPHrine (EPIPEN 2-PAK) 0.3 mg/0.3 mL IJ SOAJ injection Inject into the muscle once.    . fluticasone (FLONASE) 50 MCG/ACT nasal spray Place 2 sprays into both nostrils daily.    Marland Kitchen L-Arginine 500 MG CAPS Take by mouth.    . Lansoprazole (PREVACID PO) Take 30 mg by mouth.    . levothyroxine (SYNTHROID, LEVOTHROID) 100 MCG tablet Take 100 mcg by mouth daily before breakfast.    . Magnesium 500 MG CAPS Take by mouth.    . Multiple Vitamins-Minerals (  CENTRUM SILVER ADULT 50+ PO) Take by mouth.    . Nutritional Supplements (OSTEO ADVANCE PO) Take by mouth.    . Olopatadine HCl 0.6 % SOLN USE 1-2 SPRAYS IN EACH NOSTRIL ONCE DAILY IN THE EVENING AS NEEDED FOR RUNNY NOSE OR CONGESTION.  4  . Omega-3 Fatty Acids (FISH OIL) 1000 MG CAPS Take by mouth.    .  oxyCODONE-acetaminophen (ROXICET) 5-325 MG tablet Take 1-2 tablets by mouth every 4 (four) hours as needed. 50 tablet 0  . Probiotic Product (SOLUBLE FIBER/PROBIOTICS PO) Take by mouth.    . pyridOXINE (VITAMIN B-6) 100 MG tablet Take 100 mg by mouth daily.    . rosuvastatin (CRESTOR) 20 MG tablet Take 20 mg by mouth daily.    . SUMAtriptan (IMITREX) 25 MG tablet Take 25 mg by mouth every 2 (two) hours as needed for migraine. May repeat in 2 hours if headache persists or recurs.    . tolterodine (DETROL LA) 4 MG 24 hr capsule Take 4 mg by mouth daily.    . TURMERIC CURCUMIN PO Take 1,000 mg by mouth 3 (three) times daily.    . vitamin B-12 (CYANOCOBALAMIN) 1000 MCG tablet Take 1,000 mcg by mouth daily.    . Zinc 50 MG CAPS Take by mouth.     No current facility-administered medications for this visit.    PHYSICAL EXAMINATION: ECOG PERFORMANCE STATUS: 1 - Symptomatic but completely ambulatory  There were no vitals filed for this visit. There were no vitals filed for this visit.  GENERAL:alert, no distress and comfortable SKIN: skin color, texture, turgor are normal, no rashes or significant lesions EYES: normal, Conjunctiva are pink and non-injected, sclera clear OROPHARYNX:no exudate, no erythema and lips, buccal mucosa, and tongue normal  NECK: supple, thyroid normal size, non-tender, without nodularity LYMPH:  no palpable lymphadenopathy in the cervical, axillary or inguinal LUNGS: clear to auscultation and percussion with normal breathing effort HEART: regular rate & rhythm and no murmurs and no lower extremity edema ABDOMEN:abdomen soft, non-tender and normal bowel sounds Musculoskeletal:no cyanosis of digits and no clubbing  NEURO: alert & oriented x 3 with fluent speech, no focal motor/sensory deficits  LABORATORY DATA:  I have reviewed the data as listed   Chemistry      Component Value Date/Time   NA 141 11/19/2014 1247   K 4.6 11/19/2014 1247   CO2 27 11/19/2014 1247    BUN 16.6 11/19/2014 1247   CREATININE 0.8 11/19/2014 1247      Component Value Date/Time   CALCIUM 9.4 11/19/2014 1247   ALKPHOS 51 11/19/2014 1247   AST 22 11/19/2014 1247   ALT 25 11/19/2014 1247   BILITOT 0.38 11/19/2014 1247       Lab Results  Component Value Date   WBC 6.8 11/19/2014   HGB 13.1 11/19/2014   HCT 39.3 11/19/2014   MCV 88.3 11/19/2014   PLT 209 11/19/2014   NEUTROABS 4.7 11/19/2014   ASSESSMENT & PLAN:  Breast cancer of 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 breast ([facility]) Right breast biopsy 11/11/2014 upper outer quadrant: 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 Rt Lumpectomy 12/10/14: Benign  Recommendation: 1. Adj XRT 2. Followed by antiestrogen therapy with tamoxifen 5 years We discussed the risks and benefits of tamoxifen. These include but not limited to insomnia, hot flashes, mood changes, vaginal dryness, and weight gain. Although rare, serious side  effects including endometrial cancer, risk of blood clots were also discussed. We strongly believe that the benefits far outweigh the risks. Patient understands these risks and consented to starting treatment. Planned treatment duration is 5 years.  Left breast discomfort: No palpable abnormality. I recommended watchful monitoring. If there is any palpable change, we can obtain her ultrasound. Or if the symptoms continue to be worse even a month or two, then we might obtain additional imaging studies.  RTC 1 month after starting tamoxifen.   No orders of the defined types were placed in this encounter.   The patient has a good understanding of the overall plan. she agrees with it. she will call with any problems that may develop before the next visit here.   Rulon Eisenmenger, MD 12/23/2014
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
## 2014-12-24 ENCOUNTER — Ambulatory Visit: Payer: 59 | Admitting: Hematology and Oncology

## 2014-12-24 ENCOUNTER — Encounter: Payer: Self-pay | Admitting: Radiation Oncology

## 2014-12-24 ENCOUNTER — Ambulatory Visit
Admission: RE | Admit: 2014-12-24 | Discharge: 2014-12-24 | Disposition: A | Discharge status: Home / Self Care | Payer: 59 | Source: Ambulatory Visit | Attending: Radiation Oncology | Admitting: Radiation Oncology

## 2014-12-24 ENCOUNTER — Telehealth: Payer: Self-pay | Admitting: Hematology and Oncology

## 2014-12-24 VITALS — BP 111/41 | HR 72 | Temp 97.9°F | Resp 18 | Ht 65.0 in | Wt 146.0 lb

## 2014-12-24 DIAGNOSIS — Z510 Encounter for antineoplastic radiation therapy: Secondary | ICD-10-CM | POA: Diagnosis not present

## 2014-12-24 DIAGNOSIS — Z170 Estrogen receptor positive status [ER+]: Secondary | ICD-10-CM | POA: Diagnosis not present

## 2014-12-24 DIAGNOSIS — C50211 Malignant neoplasm of 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 breast: Secondary | ICD-10-CM | POA: Diagnosis present

## 2014-12-24 MED ORDER — LORAZEPAM 0.5 MG PO TABS: 0.5000 mg | ORAL_TABLET | Freq: Three times a day (TID) | ORAL | Status: DC

## 2014-12-24 NOTE — Progress Notes (Signed)
Please see the Nurse Progress Note in the MD Initial Consult Encounter for this patient. 

## 2014-12-24 NOTE — Progress Notes (Signed)
Radiation Oncology         [phone]) [phone] ________________________________  Name: Valerie Snyder MRN: [id number]  Date: 12/24/2014  DOB: [birth year]  Re-Evaluation Note  CC: Gerrit Heck, MD  Jovita Kussmaul, MD    ICD-9-CM ICD-10-CM   1. Breast cancer of 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 breast ([place]) 174.2 C50.211     Diagnosis: Right breast low grade DCIS, ER +, PR +  Narrative:  The patient returns today for a re-evaluation since my initial consultation with her in multidisciplinary breast clinic. She underwent a right breast lumpectomy on 12/10/2014 and presented to Dr. Lindi Adie on 12/23/14 to discuss the results.Pathology revealed benign breast tissue that is negative for atypia or malignancy. Dr. Lindi Adie referred the patient to me for the consideration of radiotherapy and he plans to put her on Tamoxifen afterwards.  She presents to the clinic with her husband. She reports some discomfort and sharp pains in the right breast. .  ALLERGIES:  is allergic to keflex and toradol.  Meds: Current Outpatient Prescriptions  Medication Sig Dispense Refill  . calcium carbonate (CALCIUM 600) 1500 (600 CA) MG TABS tablet Take by mouth 2 (two) times daily with a meal.    . cetirizine (ZYRTEC) 10 MG tablet Take 10 mg by mouth daily.    . Cholecalciferol (D-3-5) 5000 UNITS capsule Take 5,000 Units by mouth daily.    . Coenzyme Q10 (CO Q-10) 100 MG CAPS Take by mouth.    . Cranberry-Vitamin C-Vitamin E (CRANBERRY PLUS VITAMIN C) 4200-20-3 MG-MG-UNIT CAPS Take by mouth.    . EPINEPHrine (EPIPEN 2-PAK) 0.3 mg/0.3 mL IJ SOAJ injection Inject into the muscle once.    . fluticasone (FLONASE) 50 MCG/ACT nasal spray Place 2 sprays into both nostrils daily.    Marland Kitchen L-Arginine 500 MG CAPS Take by mouth.    . Lansoprazole (PREVACID PO) Take 30 mg by mouth.    . levothyroxine (SYNTHROID, LEVOTHROID) 100 MCG tablet Take 100 mcg by mouth daily before breakfast.    . Magnesium 500 MG CAPS Take  by mouth.    . Multiple Vitamins-Minerals (CENTRUM SILVER ADULT 50+ PO) Take by mouth.    . Nutritional Supplements (OSTEO ADVANCE PO) Take by mouth.    . Olopatadine HCl 0.6 % SOLN USE 1-2 SPRAYS IN EACH NOSTRIL ONCE DAILY IN THE EVENING AS NEEDED FOR RUNNY NOSE OR CONGESTION.  4  . Omega-3 Fatty Acids (FISH OIL) 1000 MG CAPS Take by mouth.    . Probiotic Product (SOLUBLE FIBER/PROBIOTICS PO) Take by mouth.    . pyridOXINE (VITAMIN B-6) 100 MG tablet Take 100 mg by mouth daily.    . rosuvastatin (CRESTOR) 20 MG tablet Take 20 mg by mouth daily.    Marland Kitchen tolterodine (DETROL LA) 4 MG 24 hr capsule Take 4 mg by mouth daily.    . TURMERIC CURCUMIN PO Take 1,000 mg by mouth 3 (three) times daily.    . vitamin B-12 (CYANOCOBALAMIN) 1000 MCG tablet Take 1,000 mcg by mouth daily.    . Zinc 50 MG CAPS Take by mouth.    Marland Kitchen albuterol (PROVENTIL HFA;VENTOLIN HFA) 108 (90 BASE) MCG/ACT inhaler Inhale into the lungs every 6 (six) hours as needed for wheezing or shortness of breath.    Marland Kitchen LORazepam (ATIVAN) 0.5 MG tablet Take 1 tablet (0.5 mg total) by mouth every 8 (eight) hours. 10 tablet 0  . oxyCODONE-acetaminophen (ROXICET) 5-325 MG tablet Take 1-2 tablets by mouth every 4 (four) hours as needed. (Patient not taking:  Reported on 12/24/2014) 50 tablet 0  . SUMAtriptan (IMITREX) 25 MG tablet Take 25 mg by mouth every 2 (two) hours as needed for migraine. May repeat in 2 hours if headache persists or recurs.    Derrill Memo ON 03/04/2015] tamoxifen (NOLVADEX) 20 MG tablet Take 1 tablet (20 mg total) by mouth daily. (Patient not taking: Reported on 12/24/2014) 90 tablet 3   No current facility-administered medications for this encounter.    Physical Findings: The patient is in no acute distress. Patient is alert and oriented.  height is $RemoveB'5\' 5"'JDwMkRGs$  (1.651 m) and weight is 146 lb (66.225 kg). Her oral temperature is 97.9 F (36.6 C). Her blood pressure is 111/41 and her pulse is 72. Her respiration is 18 and oxygen  saturation is 100%.  Lungs are clear to auscultation bilaterally. Heart has regular rate and rhythm. No palpable cervical, supraclavicular, or axillary adenopathy. She has a well healing scar in the upper inner quadrant of the right breast with no drainage or signs of infection. Left breast has no palpable mass or nipple discharge.  Lab Findings: Lab Results  Component Value Date   WBC 6.8 11/19/2014   HGB 13.1 11/19/2014   HCT 39.3 11/19/2014   MCV 88.3 11/19/2014   PLT 209 11/19/2014    Radiographic Findings: Mm Breast Surgical Specimen  12/10/2014  CLINICAL DATA:  Biopsy proven low grade ductal carcinoma in-situ involving intraductal papilloma and calcifications in the right breast. EXAM: SPECIMEN RADIOGRAPH OF THE RIGHT BREAST COMPARISON:  Previous exam(s). FINDINGS: Status post excision of the right breast. The radioactive seed and biopsy marker clip are present, completely intact, and were marked for pathology. IMPRESSION: Specimen radiograph of the right breast. Electronically Signed   By: Lillia Mountain M.D.   On: 12/10/2014 12:04   Mm Rt Radioactive Seed Loc Mammo Guide  12/09/2014  CLINICAL DATA:  Preoperative localization for surgical removal of right breast lesion. EXAM: MAMMOGRAPHIC GUIDED RADIOACTIVE SEED LOCALIZATION OF THE RIGHT BREAST COMPARISON:  Previous exam(s). FINDINGS: Patient presents for radioactive seed localization prior to surgical excision of right breast lesion. I met with the patient and we discussed the procedure of seed localization including benefits and alternatives. We discussed the high likelihood of a successful procedure. We discussed the risks of the procedure including infection, bleeding, tissue injury and further surgery. We discussed the low dose of radioactivity involved in the procedure. Informed, written consent was given. The usual time-out protocol was performed immediately prior to the procedure. Using mammographic guidance, sterile technique, 2%  lidocaine and an I-125 radioactive seed, the clip was localized using a medial approach. The follow-up mammogram images confirm the seed in the expected location and were marked for Dr. Marlou Starks. Follow-up survey of the patient confirms presence of the radioactive seed. Order number of I-125 seed:  [id number]. Total activity:  5.003 millicuries  Reference Date: 12/01/2014 The patient tolerated the procedure well and was released from the [place]. She was given instructions regarding seed removal. IMPRESSION: Radioactive seed localization right breast. No apparent complications. Electronically Signed   By: Altamese Cabal M.D.   On: 12/09/2014 15:21    Impression: Low-grade intraductal carcinoma of the right breast. No residual malignancy was recovered at the time of lumpectomy consistent with complete removal of tumor with the patient's biopsy. Biopsy changes were noted at the time of patient's lumpectomy. Given the patient's relatively young age I would not recommend lumpectomy alone in this situation. I would recommend radiation therapy as part of her  overall management. She does wish to be aggressive with her postoperative management and agrees with radiation therapy treatment.  Plan: I spoke to the patient today regarding her diagnosis and options for treatment. We discussed the equivalence in terms of survival and local failure between mastectomy and breast conservation. We discussed the role of radiation in decreasing local failures in patients who undergo lumpectomy. We discussed the process of simulation and the placement tattoos. We discussed 4-6 weeks of treatment as an outpatient. We discussed the possibility of asymptomatic lung damage. We discussed the low likelihood of secondary malignancies. We discussed the possible side effects including but not limited to skin redness, fatigue, permanent skin darkening, and breast swelling. The patient signed a consent form and this was placed in her  medical chart.  The patient has notified me that she may claustrophobic, therefore I have prescribed Ativan 0.5mg   for her to take 1 hour prior CT simulation. ____________________________________  Blair Promise, PhD, MD  This document serves as a record of services personally performed by Gery Pray, MD. It was created on his behalf by Darcus Austin, a trained medical scribe. The creation of this record is based on the scribe's personal observations and the provider's statements to them. This document has been checked and approved by the attending provider.

## 2014-12-24 NOTE — Telephone Encounter (Signed)
lvm for pt regarding to May 2017 appt

## 2015-01-01 ENCOUNTER — Ambulatory Visit
Admission: RE | Admit: 2015-01-01 | Discharge: 2015-01-01 | Disposition: A | Discharge status: Home / Self Care | Payer: 59 | Source: Ambulatory Visit | Attending: Radiation Oncology | Admitting: Radiation Oncology

## 2015-01-01 ENCOUNTER — Ambulatory Visit (INDEPENDENT_AMBULATORY_CARE_PROVIDER_SITE_OTHER): Payer: 59

## 2015-01-01 ENCOUNTER — Telehealth: Payer: Self-pay

## 2015-01-01 DIAGNOSIS — J309 Allergic rhinitis, unspecified: Secondary | ICD-10-CM

## 2015-01-01 DIAGNOSIS — Z510 Encounter for antineoplastic radiation therapy: Secondary | ICD-10-CM | POA: Diagnosis not present

## 2015-01-01 NOTE — Telephone Encounter (Signed)
Patient will be starting radiation daily on December 12th x 6 wks, she wanted to know can she get her injection during this time or wait. Please advise

## 2015-01-02 ENCOUNTER — Telehealth: Payer: Self-pay | Admitting: Oncology

## 2015-01-02 NOTE — Telephone Encounter (Addendum)
Tierre called and asked if it is OK to have allergy shots during radiation and also if it is OK to dye her hair.  Left a message for her that dying her hair is OK but will discuss the allergy shots with Dr. Sondra Come next week.

## 2015-01-02 NOTE — Telephone Encounter (Signed)
Patient advised and appointment scheduled with Dr. Ishmael Holter on 01-09-15 at 1:30pm.

## 2015-01-02 NOTE — Telephone Encounter (Signed)
Called patient.  Spoke to a gentleman who advised patient was unable to come to the phone at this time and to call back later.

## 2015-01-02 NOTE — Telephone Encounter (Signed)
Inform patient unaware of new diagnosis and patient needs to return for office visit to review plan, and further changes of injections.  As had missed OV after her initial visit in June 2016.  Hold injections until office visit this month.  Also, patient needs to make [place] allergist aware since those are his vials.

## 2015-01-08 DIAGNOSIS — Z510 Encounter for antineoplastic radiation therapy: Secondary | ICD-10-CM | POA: Diagnosis not present

## 2015-01-09 ENCOUNTER — Encounter: Payer: Self-pay | Admitting: Allergy and Immunology

## 2015-01-09 ENCOUNTER — Ambulatory Visit (INDEPENDENT_AMBULATORY_CARE_PROVIDER_SITE_OTHER): Payer: 59 | Admitting: Allergy and Immunology

## 2015-01-09 ENCOUNTER — Ambulatory Visit
Admission: RE | Admit: 2015-01-09 | Discharge: 2015-01-09 | Disposition: A | Discharge status: Home / Self Care | Payer: 59 | Source: Ambulatory Visit | Attending: Radiation Oncology | Admitting: Radiation Oncology

## 2015-01-09 VITALS — BP 120/70 | HR 70 | Temp 97.5°F | Resp 18

## 2015-01-09 DIAGNOSIS — J309 Allergic rhinitis, unspecified: Secondary | ICD-10-CM | POA: Diagnosis not present

## 2015-01-09 DIAGNOSIS — Z510 Encounter for antineoplastic radiation therapy: Secondary | ICD-10-CM | POA: Diagnosis not present

## 2015-01-09 DIAGNOSIS — H1010 Acute atopic conjunctivitis, unspecified eye: Secondary | ICD-10-CM | POA: Diagnosis not present

## 2015-01-09 MED ORDER — FLUTICASONE PROPIONATE 50 MCG/ACT NA SUSP: 2.0000 | Freq: Every day | NASAL | Status: DC

## 2015-01-09 MED ORDER — OLOPATADINE HCL 0.6 % NA SOLN: NASAL | Status: DC

## 2015-01-12 ENCOUNTER — Ambulatory Visit
Admission: RE | Admit: 2015-01-12 | Discharge: 2015-01-12 | Disposition: A | Discharge status: Home / Self Care | Payer: 59 | Source: Ambulatory Visit | Attending: Radiation Oncology | Admitting: Radiation Oncology

## 2015-01-12 DIAGNOSIS — Z510 Encounter for antineoplastic radiation therapy: Secondary | ICD-10-CM | POA: Diagnosis not present

## 2015-01-12 NOTE — Progress Notes (Signed)
FOLLOW UP NOTE  RE: Valerie Snyder MRN: [id number] DOB: 1950/08/18 [facility] [address] [place] [address] Date of Office Visit: 01/09/2015  Subjective:  Valerie Snyder is a 64 y.o. female who presents today for Allergic Rhinitis follow up.   Assessment:  1.  Allergic rhinoconjunctivitis. 2.  Previous history of cough and wheeze currently asymptomatic. 3.  Recent right breast cancer diagnosis--with upcoming radiation. 4.  Complex medical history. Plan  1.   Valerie Snyder will continue her current medication regime. 2.  She will hold immunotherapy while receiving radiation, which was also the recommendation of her [place] allergist given the length of time, she has received immunotherapy. 3.  90 day refills on nasal sprays given today. 4.  Albuterol HFA as needed and will call with any recurring use. 5.  Follow-up in 3-6 months (or sooner if needed) with plans for repeat testing here, given patient's request.  Meds ordered this encounter  Medications  . fluticasone (FLONASE) 50 MCG/ACT nasal spray    Sig: Place 2 sprays into both nostrils daily.    Dispense:  51 g    Refill:  1  . Olopatadine HCl 0.6 % SOLN    Sig: Use 1-2 sprays in each nostril once daily in the evening as needed for runny nose or congestion    Dispense:  3 Bottle    Refill:  1    HPI: [name] returns to the office regarding allergy injections.  Since her initial visit in March, she continued her injections from [place] allergist (completed full 3 years--and likely 3 years from [place] allergist) now at every 3 weeks.  She reports a rare red, small pruritic areas-at most golf ball size and dose has been decreased then tolerated without issue.  She applies cream and on rare occasions has used Benadryl.  However, on October 12th, she received a breast cancer diagnosis, now status post right lumpectomy last month with upcoming radiation to start next week.  She had called last  week regarding what modifications to initiate during her radiation time over a several weeks.  She is very pleased with the nasal sprays and is requesting 90 day supply.  She denies other new concerns, questions, albuterol use or recurring upper or lower respiratory symptoms.  There have been no acute care or emergency room visits, prednisone or antibiotic courses.  Today, I did speak with her [place] allergist reviewing [name]'s current medical status.  Current Medications: 1.  Flonase 2 sprays daily. 2.  Patanase 2 sprays once twice daily. 3.  Albuterol HFA/EpiPen/Benadryl as needed. 4.  Zyrtec 10 mg once daily. 5.  Daily calcium, vitamin D3, Coke, every 10, cranberry/vitamin C/E, L-arginine, omega-3, multivitamin, magnesium, Prevacid, Synthroid, Crestar, probiotic, vitamin B 6/12, tumeric, zinc, Detrol LA and Ativan. 6.  As needed Roxicet and Imitrex.  Drug Allergies: Allergies  Allergen Reactions  . Keflex [Cephalexin]     Puffy eyes and lips  . Toradol [Ketorolac Tromethamine]     Puffy lips and eyes    Objective:   Filed Vitals:   01/09/15 1339  BP: 120/70  Pulse: 70  Temp: 97.5 F (36.4 C)  Resp: 18   ] Physical Exam  Constitutional: She is well-developed, well-nourished, and in no distress.  HENT:  Head: Atraumatic.  Right Ear: Tympanic membrane and ear canal normal.  Left Ear: Tympanic membrane and ear canal normal.  Nose: Mucosal edema (minimal) present. No rhinorrhea. No epistaxis.  Mouth/Throat: Oropharynx is clear and moist  and mucous membranes are normal. No oropharyngeal exudate, posterior oropharyngeal edema or posterior oropharyngeal erythema.  Neck: Neck supple.  Cardiovascular: Normal rate, S1 normal and S2 normal.   No murmur heard. Pulmonary/Chest: Effort normal. She has no wheezes. She has no rhonchi. She has no rales.  Lymphadenopathy:    She has no cervical adenopathy.      Roselyn M. Ishmael Holter, MD  cc: Gerrit Heck, MD

## 2015-01-13 ENCOUNTER — Ambulatory Visit
Admission: RE | Admit: 2015-01-13 | Discharge: 2015-01-13 | Disposition: A | Discharge status: Home / Self Care | Payer: 59 | Source: Ambulatory Visit | Attending: Radiation Oncology | Admitting: Radiation Oncology

## 2015-01-13 ENCOUNTER — Encounter: Payer: Self-pay | Admitting: Radiation Oncology

## 2015-01-13 VITALS — BP 111/58 | HR 80 | Temp 98.0°F | Resp 16 | Ht 65.0 in | Wt 145.5 lb

## 2015-01-13 DIAGNOSIS — C50211 Malignant neoplasm of 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 breast: Secondary | ICD-10-CM | POA: Insufficient documentation

## 2015-01-13 DIAGNOSIS — Z510 Encounter for antineoplastic radiation therapy: Secondary | ICD-10-CM | POA: Diagnosis not present

## 2015-01-13 MED ORDER — RADIAPLEXRX EX GEL
Freq: Once | CUTANEOUS | Status: AC
  Administered 2015-01-13: 15:00:00 via TOPICAL

## 2015-01-13 MED ORDER — ALRA NON-METALLIC DEODORANT (RAD-ONC)
1.0000 "application " | Freq: Once | TOPICAL | Status: AC
  Administered 2015-01-13: 1 via TOPICAL

## 2015-01-13 NOTE — Progress Notes (Signed)
  Radiation Oncology         [phone]) [phone] ________________________________  Name: Valerie Snyder MRN: [id number]  Date: 01/13/2015  DOB: April 08, 1950  Weekly Radiation Therapy Management    ICD-9-CM ICD-10-CM   1. Breast cancer of 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 breast ([place]) 174.2 C50.211      Current Dose: 5.34 Gy     Planned Dose:  42.72 Gy  Narrative . . . . . . . . The patient presents for routine under treatment assessment.                                   The patient is without complaint. Tolerated first 2 treatments well                                 Set-up films were reviewed.                                 The chart was checked. Physical Findings. . .  height is 5\' 5"  (1.651 m) and weight is 145 lb 8 oz (65.998 kg). Her oral temperature is 98 F (36.7 C). Her blood pressure is 111/58 and her pulse is 80. Her respiration is 16. . Weight essentially stable.  No significant changes. The lungs are clear. The heart has regular rhythm and rate. Impression . . . . . . . The patient is tolerating radiation. Plan . . . . . . . . . . . . Continue treatment as planned.  ________________________________   Blair Promise, PhD, MD

## 2015-01-13 NOTE — Progress Notes (Signed)
Valerie Snyder has completed 2 fractions to her right breast.  She denies pain and fatigue.  The skin on her right breast is intact.   BP 111/58 mmHg  Pulse 80  Temp(Src) 98 F (36.7 C) (Oral)  Resp 16  Ht 5\' 5"  (1.651 m)  Wt 145 lb 8 oz (65.998 kg)  BMI 24.21 kg/m2

## 2015-01-13 NOTE — Progress Notes (Signed)
Pt here for patient teaching.  Pt given Radiation and You booklet, skin care instructions, Alra deodorant and Radiaplex gel. Pt reports they have not watched the Radiation Therapy Education video and has been given the link to watch the video at home.  Reviewed areas of pertinence such as fatigue, skin changes, breast tenderness and breast swelling . Pt able to give teach back of to pat skin and use unscented/gentle soap,apply Radiaplex bid, avoid applying anything to skin within 4 hours of treatment and avoid wearing an under wire bra. Pt demonstrated understanding and verbalizes understanding of information given and will contact nursing with any questions or concerns.     [email or web address]

## 2015-01-14 ENCOUNTER — Ambulatory Visit
Admission: RE | Admit: 2015-01-14 | Discharge: 2015-01-14 | Disposition: A | Discharge status: Home / Self Care | Payer: 59 | Source: Ambulatory Visit | Attending: Radiation Oncology | Admitting: Radiation Oncology

## 2015-01-14 DIAGNOSIS — Z510 Encounter for antineoplastic radiation therapy: Secondary | ICD-10-CM | POA: Diagnosis not present

## 2015-01-15 ENCOUNTER — Ambulatory Visit
Admission: RE | Admit: 2015-01-15 | Discharge: 2015-01-15 | Disposition: A | Discharge status: Home / Self Care | Payer: 59 | Source: Ambulatory Visit | Attending: Radiation Oncology | Admitting: Radiation Oncology

## 2015-01-15 DIAGNOSIS — Z510 Encounter for antineoplastic radiation therapy: Secondary | ICD-10-CM | POA: Diagnosis not present

## 2015-01-16 ENCOUNTER — Ambulatory Visit
Admission: RE | Admit: 2015-01-16 | Discharge: 2015-01-16 | Disposition: A | Discharge status: Home / Self Care | Payer: 59 | Source: Ambulatory Visit | Attending: Radiation Oncology | Admitting: Radiation Oncology

## 2015-01-16 DIAGNOSIS — Z510 Encounter for antineoplastic radiation therapy: Secondary | ICD-10-CM | POA: Diagnosis not present

## 2015-01-19 ENCOUNTER — Ambulatory Visit
Admission: RE | Admit: 2015-01-19 | Discharge: 2015-01-19 | Disposition: A | Discharge status: Home / Self Care | Payer: 59 | Source: Ambulatory Visit | Attending: Radiation Oncology | Admitting: Radiation Oncology

## 2015-01-19 ENCOUNTER — Encounter: Payer: Self-pay | Admitting: *Deleted

## 2015-01-19 DIAGNOSIS — Z510 Encounter for antineoplastic radiation therapy: Secondary | ICD-10-CM | POA: Diagnosis not present

## 2015-01-20 ENCOUNTER — Encounter: Payer: Self-pay | Admitting: Radiation Oncology

## 2015-01-20 ENCOUNTER — Ambulatory Visit
Admission: RE | Admit: 2015-01-20 | Discharge: 2015-01-20 | Disposition: A | Discharge status: Home / Self Care | Payer: 59 | Source: Ambulatory Visit | Attending: Radiation Oncology | Admitting: Radiation Oncology

## 2015-01-20 VITALS — BP 118/62 | HR 87 | Temp 97.8°F | Resp 20 | Wt 147.2 lb

## 2015-01-20 DIAGNOSIS — C50211 Malignant neoplasm of 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 breast: Secondary | ICD-10-CM

## 2015-01-20 DIAGNOSIS — Z510 Encounter for antineoplastic radiation therapy: Secondary | ICD-10-CM | POA: Diagnosis not present

## 2015-01-20 NOTE — Progress Notes (Addendum)
Weekly right breast 7/16  Completed, mild erythema ,skin intact, using radaiplex bid, c/o dizzyness when getting off the linac table after radiation lasts 2 minutes, took orthostaic b/p, wnl, but takes zyrtec and detrol  Other medicatins that could cause this stated, appetite good, no fatigue stated BP 119/70 mmHg  Pulse 90  Temp(Src) 97.8 F (36.6 C) (Oral)  Resp 20  Wt 147 lb 3.2 oz (66.769 kg) sitting vitals BP 118/62 mmHg  Pulse 87  Temp(Src) 97.8 F (36.6 C) (Oral)  Resp 20  Wt 147 lb 3.2 oz (66.769 kg) standing vitals  Wt Readings from Last 3 Encounters:  01/20/15 147 lb 3.2 oz (66.769 kg)  01/13/15 145 lb 8 oz (65.998 kg)  12/24/14 146 lb (66.225 kg)  9:08 AM

## 2015-01-20 NOTE — Progress Notes (Signed)
  Radiation Oncology         [phone] ________________________________  Name: Valerie Snyder MRN: [id number]  Date: 01/20/2015  DOB: 04-07-1950  Weekly Radiation Therapy Management    ICD-9-CM ICD-10-CM   1. Breast cancer of 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 breast ([place]) 174.2 C50.211      Current Dose: 18.69 Gy     Planned Dose:  42.72 Gy  Narrative . . . . . . . . The patient presents for routine under treatment assessment.                                   The patient is without complaint. Noticed some mild dizziness when getting up off the treatment table. She denies any itching or discomfort within the treatment area                                 Set-up films were reviewed.                                 The chart was checked. Physical Findings. . .  weight is 147 lb 3.2 oz (66.769 kg). Her oral temperature is 97.8 F (36.6 C). Her blood pressure is 118/62 and her pulse is 87. Her respiration is 20. . Weight essentially stable.  No significant changes. The lungs are clear. The heart has a regular rhythm and rate your the right breast area shows some mild erythema. Impression . . . . . . . The patient is tolerating radiation. Plan . . . . . . . . . . . . Continue treatment as planned.  ________________________________   Blair Promise, PhD, MD

## 2015-01-21 ENCOUNTER — Ambulatory Visit
Admission: RE | Admit: 2015-01-21 | Discharge: 2015-01-21 | Disposition: A | Discharge status: Home / Self Care | Payer: 59 | Source: Ambulatory Visit | Attending: Radiation Oncology | Admitting: Radiation Oncology

## 2015-01-21 DIAGNOSIS — Z510 Encounter for antineoplastic radiation therapy: Secondary | ICD-10-CM | POA: Diagnosis not present

## 2015-01-22 ENCOUNTER — Ambulatory Visit
Admission: RE | Admit: 2015-01-22 | Discharge: 2015-01-22 | Disposition: A | Discharge status: Home / Self Care | Payer: 59 | Source: Ambulatory Visit | Attending: Radiation Oncology | Admitting: Radiation Oncology

## 2015-01-22 DIAGNOSIS — Z510 Encounter for antineoplastic radiation therapy: Secondary | ICD-10-CM | POA: Diagnosis not present

## 2015-01-23 ENCOUNTER — Ambulatory Visit
Admission: RE | Admit: 2015-01-23 | Discharge: 2015-01-23 | Disposition: A | Discharge status: Home / Self Care | Payer: 59 | Source: Ambulatory Visit | Attending: Radiation Oncology | Admitting: Radiation Oncology

## 2015-01-23 DIAGNOSIS — Z510 Encounter for antineoplastic radiation therapy: Secondary | ICD-10-CM | POA: Diagnosis not present

## 2015-01-27 ENCOUNTER — Ambulatory Visit
Admission: RE | Admit: 2015-01-27 | Discharge: 2015-01-27 | Disposition: A | Discharge status: Home / Self Care | Payer: 59 | Source: Ambulatory Visit | Attending: Radiation Oncology | Admitting: Radiation Oncology

## 2015-01-27 ENCOUNTER — Encounter: Payer: Self-pay | Admitting: Radiation Oncology

## 2015-01-27 VITALS — BP 110/65 | HR 98 | Temp 98.8°F | Ht 65.0 in | Wt 146.3 lb

## 2015-01-27 DIAGNOSIS — C50211 Malignant neoplasm of 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 breast: Secondary | ICD-10-CM

## 2015-01-27 DIAGNOSIS — Z510 Encounter for antineoplastic radiation therapy: Secondary | ICD-10-CM | POA: Diagnosis not present

## 2015-01-27 NOTE — Progress Notes (Signed)
Weekly Management Note Current Dose: 29.3  Gy  Projected Dose: 42.72 Gy   Narrative:  The patient presents for routine under treatment assessment.  CBCT/MVCT images/Port film x-rays were reviewed.  The chart was checked.  [name] has received 11 fractions. Denies any pain today. She has some tenderness to right breast. She is using Radiplex to the right breast twice a day. Energy level and appetite is good. Mentioned that her husband is very depressed and is verbally unkind since she received her diagnosis of breast cancer. She is seeing a Marketing executive at this time. She is trying to stay physically active.  Physical Findings: Weight: 146 lb 4.8 oz (66.361 kg). She has mild pink skin over her right breast.  Impression:  The patient is tolerating radiation.  Plan:  Continue treatment as planned. We will ask social work to check in with her. Continue radiaplex.   This document serves as a record of services personally performed by Thea Silversmith, MD. It was created on her behalf by Darcus Austin, a trained medical scribe. The creation of this record is based on the scribe's personal observations and the provider's statements to them. This document has been checked and approved by the attending provider.

## 2015-01-27 NOTE — Progress Notes (Signed)
Valerie Snyder has received 11 fractions. Denies any pain today.  Has some tenderness to right breast, skin has slight darkness in color, using Radiplex to right breast twice a day.  Energy level is good.  Appetite is good. Mentioned that her husband is very depressed and is verbally unkind since she received her diagnosis of breast cancer;  She is seeing a Marketing executive.

## 2015-01-28 ENCOUNTER — Encounter (HOSPITAL_COMMUNITY): Payer: Self-pay

## 2015-01-28 ENCOUNTER — Ambulatory Visit
Admission: RE | Admit: 2015-01-28 | Discharge: 2015-01-28 | Disposition: A | Discharge status: Home / Self Care | Payer: 59 | Source: Ambulatory Visit | Attending: Radiation Oncology | Admitting: Radiation Oncology

## 2015-01-28 DIAGNOSIS — Z510 Encounter for antineoplastic radiation therapy: Secondary | ICD-10-CM | POA: Diagnosis not present

## 2015-01-29 ENCOUNTER — Encounter: Payer: Self-pay | Admitting: *Deleted

## 2015-01-29 ENCOUNTER — Ambulatory Visit
Admission: RE | Admit: 2015-01-29 | Discharge: 2015-01-29 | Disposition: A | Discharge status: Home / Self Care | Payer: 59 | Source: Ambulatory Visit | Attending: Radiation Oncology | Admitting: Radiation Oncology

## 2015-01-29 DIAGNOSIS — Z510 Encounter for antineoplastic radiation therapy: Secondary | ICD-10-CM | POA: Diagnosis not present

## 2015-01-29 NOTE — Progress Notes (Signed)
[facility] Work  Clinical Social Work was referred by Pension scheme manager for assessment of psychosocial needs.  Clinical Social Worker contacted patient at home to offer support and assess for needs. CSW had to leave supportive message. CSW received follow up call from pt and CSW processed her concerns. Pt and CSW had lengthy discussion related to relationship concerns. Pt has complex marital concerns, but is effectively addressing these through the [facility]. Pt appears to be able to thoroughly process, evaluate and handle her feelings related to her relationship and her husband's untreated depression. Pt and her husband are safe and there are no current SI or other safety concerns noted. She has a strong support system from extended family and her counselor. She has a very healthy outlook and approach on her complex marital relationship. She returns to her counselor on 02/02/15. She is open to reaching out to this CSW as needed, but her concerns are not related to processing her cancer journey. CSW educated pt on additional supports at the center and pt is open to [facility] that starts in Feb. Pt agrees for CSW to register her for this class. Pt appreciative of call and felt "lightened" after our thorough discussion.    Clinical Social Work interventions: Supportive listening  Emotional support Resource education and referral  Loren Racer, [place] Worker [place]  [place] Phone: [phone] Fax: [phone]

## 2015-01-30 ENCOUNTER — Ambulatory Visit
Admission: RE | Admit: 2015-01-30 | Discharge: 2015-01-30 | Disposition: A | Discharge status: Home / Self Care | Payer: 59 | Source: Ambulatory Visit | Attending: Radiation Oncology | Admitting: Radiation Oncology

## 2015-01-30 DIAGNOSIS — Z510 Encounter for antineoplastic radiation therapy: Secondary | ICD-10-CM | POA: Diagnosis not present

## 2015-02-03 ENCOUNTER — Encounter: Payer: Self-pay | Admitting: Radiation Oncology

## 2015-02-03 ENCOUNTER — Ambulatory Visit
Admission: RE | Admit: 2015-02-03 | Discharge: 2015-02-03 | Disposition: A | Discharge status: Home / Self Care | Payer: 59 | Source: Ambulatory Visit | Attending: Radiation Oncology | Admitting: Radiation Oncology

## 2015-02-03 VITALS — BP 119/53 | HR 82 | Temp 97.5°F | Resp 20 | Wt 146.1 lb

## 2015-02-03 DIAGNOSIS — C50211 Malignant neoplasm of 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 breast: Secondary | ICD-10-CM

## 2015-02-03 DIAGNOSIS — Z510 Encounter for antineoplastic radiation therapy: Secondary | ICD-10-CM | POA: Diagnosis not present

## 2015-02-03 NOTE — Progress Notes (Signed)
  Radiation Oncology         [phone] ________________________________  Name: Valerie Snyder MRN: [id number]  Date: 02/03/2015  DOB: Jun 15, 1950  Weekly Radiation Therapy Management    ICD-9-CM ICD-10-CM   1. Breast cancer of 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 breast ([facility]) 174.2 C50.211      Current Dose: 40.05  Gy     Planned Dose:  42.72 Gy  Narrative . . . . . . . . The patient presents for routine under treatment assessment.                                   The patient is without complaint. Minimal sensitivity within the right breast and fatigue                                Set-up films were reviewed.                                 The chart was checked. Physical Findings. . .  weight is 146 lb 1.6 oz (66.271 kg). Her oral temperature is 97.5 F (36.4 C). Her blood pressure is 119/53 and her pulse is 82. Her respiration is 20. . Weight essentially stable.  The right breast area shows some mild erythema and hyperpigmentation changes. No skin breakdown Impression . . . . . . . The patient is tolerating radiation. Plan . . . . . . . . . . . . Continue treatment as planned.  ________________________________   Blair Promise, PhD, MD

## 2015-02-03 NOTE — Progress Notes (Signed)
Weekly rad txs 15/16 right breast completed, mild erythema, using radiaplex bid, and started cortisone cream ,slight itching  Stated, appetite good, no other c/o's 8:43 AM BP 119/53 mmHg  Pulse 82  Temp(Src) 97.5 F (36.4 C) (Oral)  Resp 20  Wt 146 lb 1.6 oz (66.271 kg)  Wt Readings from Last 3 Encounters:  02/03/15 146 lb 1.6 oz (66.271 kg)  01/27/15 146 lb 4.8 oz (66.361 kg)  01/20/15 147 lb 3.2 oz (66.769 kg)

## 2015-02-04 ENCOUNTER — Ambulatory Visit
Admission: RE | Admit: 2015-02-04 | Discharge: 2015-02-04 | Disposition: A | Discharge status: Home / Self Care | Payer: 59 | Source: Ambulatory Visit | Attending: Radiation Oncology | Admitting: Radiation Oncology

## 2015-02-04 ENCOUNTER — Encounter: Payer: Self-pay | Admitting: Radiation Oncology

## 2015-02-04 DIAGNOSIS — Z510 Encounter for antineoplastic radiation therapy: Secondary | ICD-10-CM | POA: Diagnosis not present

## 2015-02-05 ENCOUNTER — Ambulatory Visit: Payer: 59

## 2015-02-06 ENCOUNTER — Other Ambulatory Visit: Payer: Self-pay | Admitting: Adult Health

## 2015-02-06 ENCOUNTER — Ambulatory Visit: Payer: 59

## 2015-02-06 ENCOUNTER — Encounter: Payer: Self-pay | Admitting: *Deleted

## 2015-02-06 DIAGNOSIS — C50211 Malignant neoplasm of 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 breast: Secondary | ICD-10-CM

## 2015-02-09 ENCOUNTER — Ambulatory Visit: Payer: 59

## 2015-02-10 ENCOUNTER — Ambulatory Visit: Payer: 59

## 2015-02-10 ENCOUNTER — Telehealth: Payer: Self-pay | Admitting: Nurse Practitioner

## 2015-02-10 NOTE — Telephone Encounter (Signed)
Left message for patient re SCP visit for 4/13 also confirmed 3/2 VG. Schedule mailed.

## 2015-02-11 ENCOUNTER — Ambulatory Visit: Payer: 59

## 2015-02-12 ENCOUNTER — Ambulatory Visit: Payer: 59

## 2015-02-13 ENCOUNTER — Ambulatory Visit: Payer: 59

## 2015-02-15 NOTE — Progress Notes (Signed)
  Radiation Oncology         [phone] ________________________________  Name: Valerie Snyder MRN: [id number]  Date: 02/04/2015  DOB: 1950-06-18  End of Treatment Note    ICD-9-CM ICD-10-CM   1. Breast cancer of 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 breast ([place]) 174.2 C50.211     Diagnosis: Right breast low grade DCIS, ER +, PR +     Indication for treatment:  Breast conservation therapy       Radiation treatment dates:   01/12/2015-02/04/2015  Site/dose:   Right breast, 42.72 gray in 16 fractions (hypo-fractionated treatment)  Beams/energy:   3-D conformal, 6x beams  Narrative: The patient tolerated radiation treatment relatively well.   Minimal sensitivity within the right breast and fatigue.  Plan: The patient has completed radiation treatment. The patient will return to radiation oncology clinic for routine followup in one month. I advised them to call or return sooner if they have any questions or concerns related to their recovery or treatment.  -----------------------------------  Blair Promise, PhD, MD

## 2015-02-16 ENCOUNTER — Ambulatory Visit: Payer: 59

## 2015-02-17 ENCOUNTER — Ambulatory Visit: Payer: 59

## 2015-02-18 ENCOUNTER — Ambulatory Visit: Payer: 59

## 2015-02-19 ENCOUNTER — Ambulatory Visit: Payer: 59

## 2015-02-20 ENCOUNTER — Ambulatory Visit: Payer: 59

## 2015-03-03 ENCOUNTER — Telehealth: Payer: Self-pay

## 2015-03-03 ENCOUNTER — Telehealth: Payer: Self-pay | Admitting: Allergy and Immunology

## 2015-03-03 NOTE — Telephone Encounter (Signed)
Spoke with Dr Ishmael Holter and Tomoxifen does not have any antihistamines in it. Notified patient on her personal voicemail and advised that if she had any other questions she could contact us back.

## 2015-03-03 NOTE — Telephone Encounter (Signed)
Returned pt call.  Pt wants to start tamoxifen 03/11/15 due to family coming in.  She also wants to ensure starting 2/8 will not be a problem with her allergy test on 2/9.  Writer advised pt it is fine to start tamoxifen 2/8 and there not be a problem with her allergy test.  Advised pt to ensure she lets her allergist know everything she is taking.  Pt voiced understanding.

## 2015-03-03 NOTE — Telephone Encounter (Signed)
Pt has allergy test apt on Feb 9th.  She begins her cancer medication, tamoxifen, the day before on Feb 8th.  She is wondering if this will interfere with the skin test and should she reschedule.  pls advise

## 2015-03-12 ENCOUNTER — Encounter: Payer: Self-pay | Admitting: Oncology

## 2015-03-12 ENCOUNTER — Ambulatory Visit
Admission: RE | Admit: 2015-03-12 | Discharge: 2015-03-12 | Disposition: A | Discharge status: Home / Self Care | Payer: 59 | Source: Ambulatory Visit | Attending: Radiation Oncology | Admitting: Radiation Oncology

## 2015-03-12 ENCOUNTER — Ambulatory Visit (INDEPENDENT_AMBULATORY_CARE_PROVIDER_SITE_OTHER): Payer: 59 | Admitting: Allergy and Immunology

## 2015-03-12 ENCOUNTER — Encounter: Payer: Self-pay | Admitting: Allergy and Immunology

## 2015-03-12 VITALS — BP 116/60 | HR 82 | Temp 98.0°F | Ht 65.0 in | Wt 146.3 lb

## 2015-03-12 VITALS — BP 118/70 | HR 84 | Temp 98.4°F | Resp 16

## 2015-03-12 DIAGNOSIS — H1010 Acute atopic conjunctivitis, unspecified eye: Secondary | ICD-10-CM

## 2015-03-12 DIAGNOSIS — R05 Cough: Secondary | ICD-10-CM

## 2015-03-12 DIAGNOSIS — R062 Wheezing: Secondary | ICD-10-CM | POA: Diagnosis not present

## 2015-03-12 DIAGNOSIS — C50211 Malignant neoplasm of 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 breast: Secondary | ICD-10-CM

## 2015-03-12 DIAGNOSIS — J309 Allergic rhinitis, unspecified: Secondary | ICD-10-CM

## 2015-03-12 DIAGNOSIS — R059 Cough, unspecified: Secondary | ICD-10-CM

## 2015-03-12 HISTORY — DX: Reserved for inherently not codable concepts without codable children: IMO0001

## 2015-03-12 HISTORY — DX: Reserved for concepts with insufficient information to code with codable children: IMO0002

## 2015-03-12 MED ORDER — ALBUTEROL SULFATE HFA 108 (90 BASE) MCG/ACT IN AERS: 2.0000 | INHALATION_SPRAY | RESPIRATORY_TRACT | Status: DC | PRN

## 2015-03-12 NOTE — Progress Notes (Signed)
Radiation Oncology         [phone]) [phone] ________________________________  Name: Valerie Snyder MRN: [id number]  Date: 03/12/2015  DOB: 25-Jul-1950  Follow-Up Visit Note  CC: Valerie Heck, MD  Valerie Lose, MD   Diagnosis:   Right breast low grade DCIS, ER+, PR+  Interval Since Last Radiation:  1 month. Completed radiation 01/12/2015-02/04/2015 to the right breast with 42.72 Gy in 16 fractions (hypo-fractionated treatment).  Narrative:  The patient returns today for routine follow-up.  Valerie Snyder here for follow up. She reports having tenderness in her right breast. She denies having fatigue. She will start taking tamoxifen today. The skin on her right breast has hyperpigmentation. She is using radiaplex gel. She denies nipple discharge. She was on hormone replacement therapy and was describing that she had "mini-hot flashes".  ALLERGIES:  is allergic to keflex and toradol.  Meds: Current Outpatient Prescriptions  Medication Sig Dispense Refill  . albuterol (PROVENTIL HFA;VENTOLIN HFA) 108 (90 BASE) MCG/ACT inhaler Inhale into the lungs every 6 (six) hours as needed for wheezing or shortness of breath. Reported on 01/20/2015    . calcium carbonate (CALCIUM 600) 1500 (600 CA) MG TABS tablet Take by mouth 2 (two) times daily with a meal.    . cetirizine (ZYRTEC) 10 MG tablet Take 10 mg by mouth daily.    . Cholecalciferol (D-3-5) 5000 UNITS capsule Take 5,000 Units by mouth daily.    . Coenzyme Q10 (CO Q-10) 100 MG CAPS Take by mouth.    . Cranberry-Vitamin C-Vitamin E (CRANBERRY PLUS VITAMIN C) 4200-20-3 MG-MG-UNIT CAPS Take by mouth.    . EPINEPHrine (EPIPEN 2-PAK) 0.3 mg/0.3 mL IJ SOAJ injection Inject into the muscle once. Reported on 01/20/2015    . fluticasone (FLONASE) 50 MCG/ACT nasal spray Place 2 sprays into both nostrils daily. 51 g 1  . L-Arginine 500 MG CAPS Take by mouth.    . Lansoprazole (PREVACID PO) Take 30 mg by mouth.    . levothyroxine  (SYNTHROID, LEVOTHROID) 100 MCG tablet Take 100 mcg by mouth daily before breakfast.    . Magnesium 500 MG CAPS Take by mouth.    . Multiple Vitamins-Minerals (CENTRUM SILVER ADULT 50+ PO) Take by mouth.    . Nutritional Supplements (OSTEO ADVANCE PO) Take by mouth.    . Olopatadine HCl 0.6 % SOLN Use 1-2 sprays in each nostril once daily in the evening as needed for runny nose or congestion 3 Bottle 1  . Omega-3 Fatty Acids (FISH OIL) 1000 MG CAPS Take by mouth.    . Probiotic Product (SOLUBLE FIBER/PROBIOTICS PO) Take by mouth.    . pyridOXINE (VITAMIN B-6) 100 MG tablet Take 100 mg by mouth daily.    . rosuvastatin (CRESTOR) 20 MG tablet Take 20 mg by mouth daily. Reported on 01/20/2015    . SUMAtriptan (IMITREX) 25 MG tablet Take 25 mg by mouth every 2 (two) hours as needed for migraine. Reported on 01/20/2015    . tamoxifen (NOLVADEX) 20 MG tablet Take 1 tablet (20 mg total) by mouth daily. 90 tablet 3  . tolterodine (DETROL LA) 4 MG 24 hr capsule Take 4 mg by mouth daily.    . TURMERIC CURCUMIN PO Take 1,000 mg by mouth 3 (three) times daily.    . vitamin B-12 (CYANOCOBALAMIN) 1000 MCG tablet Take 1,000 mcg by mouth daily.    . Zinc 50 MG CAPS Take by mouth.    . hyaluronate sodium (RADIAPLEXRX) GEL Apply 1 application topically 2 (two) times  daily.    . non-metallic deodorant (ALRA) MISC Apply 1 application topically daily as needed. Reported on 03/12/2015     No current facility-administered medications for this encounter.    Physical Findings: The patient is in no acute distress. Patient is alert and oriented.  height is 5\' 5"  (1.651 m) and weight is 146 lb 4.8 oz (66.361 kg). Her oral temperature is 98 F (36.7 C). Her blood pressure is 116/60 and her pulse is 82. .   The lungs are clear to auscultation. The heart has a regular rhythm and rate. No palpable subclavicular or axillary adenopathy. No inguinal adenopathy is appreciated. Mild swelling and mild hyperpigmentation changes in  the right breast. No dominant mass in the right breast.    Lab Findings: Lab Results  Component Value Date   WBC 6.8 11/19/2014   HGB 13.1 11/19/2014   HCT 39.3 11/19/2014   MCV 88.3 11/19/2014   PLT 209 11/19/2014    Radiographic Findings: No results found.  Impression:  The patient is recovering from the effects of radiation, no evidence of recurrence on clinical exam today  Plan:  She is following up with Dr. Lindi Adie 04/02/2015. She has a survivorship care plan visit scheduled 05/14/2015. Routine follow up in 6 months.  ____________________________________ -----------------------------------  Blair Promise, PhD, MD    This document serves as a record of services personally performed by Gery Pray, MD. It was created on his behalf by Lendon Collar, a trained medical scribe. The creation of this record is based on the scribe's personal observations and the provider's statements to them. This document has been checked and approved by the attending provider.

## 2015-03-12 NOTE — Progress Notes (Signed)
Valerie Snyder here for follow up.  She reports having tenderness in her right breast.  She denies having fatigue.  She will start taking tamoxifen today.  The skin on her right breast has hyperpigmentation.  She is using radiaplex gel.  BP 116/60 mmHg  Pulse 82  Temp(Src) 98 F (36.7 C) (Oral)  Ht 5\' 5"  (1.651 m)  Wt 146 lb 4.8 oz (66.361 kg)  BMI 24.35 kg/m2

## 2015-03-12 NOTE — Patient Instructions (Addendum)
   Avoidance measure information on dust mite, pollen and mold.  Restart Zyrtec 10 mg daily.  Flonase 1-2 sprays once each morning.  Patanase 1-2 sprays each nostril each evening.  Ventolin HFA as needed.  Review consideration for initiating new immunotherapy course.  EpiPen, Benadryl as needed.  Follow-up in 3-4 months or sooner if needed.

## 2015-03-12 NOTE — Progress Notes (Signed)
FOLLOW UP NOTE  RE: Valerie Snyder MRN: [id number] DOB: Jun 26, 1950 [facility] [place] [address] [id number] Date of Office Visit: 03/12/2015  Subjective:  Valerie Snyder is a 65 y.o. female who presents today for Allergy Testing and Food Intolerance  Assessment:   1. Allergic rhinoconjunctivitis, with noted seasonal and perennial hypersensitivities noted on skin testing today.    2. History of cough and wheeze currently asymptomatic without recent albuterol use.    3. Complex medical history, recently completed radiation for breast cancer.    4.      Negative cacao bean (chocolate) skin testing today with question of occasional sneezing, with ingestion. Plan:   Meds ordered this encounter  Medications  . albuterol (VENTOLIN HFA) 108 (90 Base) MCG/ACT inhaler    Sig: Inhale 2 puffs into the lungs every 4 (four) hours as needed for wheezing or shortness of breath (cough).    Dispense:  1 Inhaler    Refill:  2   Patient Instructions  1.  Avoidance measure information on dust mite, pollen and mold. 2.  Restart Zyrtec 10 mg daily. 3.  Flonase 1-2 sprays once each morning. 4.  Patanase 1-2 sprays each nostril each evening. 5.  Ventolin HFA as needed. 6.  Review consideration for initiating new immunotherapy course, reviewed in detail in office protocol and consent for making of vials as well as risk-benefit, side effect profile. 7.  EpiPen, Benadryl as needed. 8.  Follow-up in 3-4 months or sooner if needed.  HPI: Valerie Snyder returns to the office with desire for reevaluation of aeroallergen hypersensitivities.  She is interested in rewriting immunotherapy after testing here, given her previous course from [place].  She reports overall doing well completed 17 sessions of radiation only half of the expected course, and is healing well from her right lumpectomy.  She feels her breathing is very good, denies cough, wheeze, shortness  breath, difficulty breathing, chest congestion or any activity-induced concerns.  She reports walking 2 miles multiple times a week.  Off Zyrtec,  she has noted itching, slight congestion but typically has no runny nose, sneezing, itchy eyes or other concerns.   The nasal sprays are beneficial as well.  No albuterol use. She started Tamoxifen today with plans, to complete 5 years. Denies ED or urgent care visits, prednisone or antibiotic courses. Reports sleep and activity are normal.  [facility] has a current medication list which includes the following prescription(s): albuterol, biotin w/ vitamins c & e, calcium carbonate, cetirizine, cholecalciferol, co q-10, cranberry-vitamin c-vitamin e, epinephrine, fluticasone, hyaluronate sodium, l-arginine, lansoprazole, levothyroxine, magnesium, misc natural products, multiple vitamins-minerals, non-metallic deodorant, olopatadine hcl, fish oil, probiotic product, pyridoxine, rosuvastatin, sumatriptan, tamoxifen, tolterodine, turmeric, vitamin b-12, zinc, and nutritional supplements.   Drug Allergies: Allergies  Allergen Reactions  . Keflex [Cephalexin]     Puffy eyes and lips  . Toradol [Ketorolac Tromethamine]     Puffy lips and eyes   Objective:   Filed Vitals:   03/12/15 1412  BP: 118/70  Pulse: 84  Temp: 98.4 F (36.9 C)  Resp: 16   SpO2 Readings from Last 1 Encounters:  03/12/15 97%   Physical Exam  Constitutional: She is well-developed, well-nourished, and in no distress.  HENT:  Head: Atraumatic.  Right Ear: Tympanic membrane and ear canal normal.  Left Ear: Tympanic membrane and ear canal normal.  Nose: Mucosal edema (minimal.) present. No rhinorrhea. No epistaxis.  Mouth/Throat: Oropharynx is clear and moist and mucous  membranes are normal. No oropharyngeal exudate, posterior oropharyngeal edema or posterior oropharyngeal erythema.  Neck: Neck supple.  Cardiovascular: Normal rate, S1 normal and S2 normal.   No murmur  heard. Pulmonary/Chest: Effort normal. She has no wheezes. She has no rhonchi. She has no rales.  Lymphadenopathy:    She has no cervical adenopathy.   Diagnostics: Spirometry:  FVC 3.4--120%, FEV1 2.24-97%.  Skin testing:  Strong reactivity to dust mite, cat hair, selected grass pollens and mild reactivity to selected mold species and tree pollens.    Waylon Koffler M. Ishmael Holter, MD  cc: Gerrit Heck, MD

## 2015-03-26 ENCOUNTER — Ambulatory Visit: Payer: Self-pay | Admitting: Dermatology

## 2015-04-01 NOTE — Assessment & Plan Note (Signed)
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 Rt Lumpectomy 12/10/14: Benign  Recommendation: 1. Completed Adj XRT 2. Followed by antiestrogen therapy with tamoxifen 5 years  Tamoxifen Toxicities:  RTC 6 months

## 2015-04-02 ENCOUNTER — Encounter: Payer: Self-pay | Admitting: Hematology and Oncology

## 2015-04-02 ENCOUNTER — Ambulatory Visit (HOSPITAL_BASED_OUTPATIENT_CLINIC_OR_DEPARTMENT_OTHER): Payer: 59 | Admitting: Hematology and Oncology

## 2015-04-02 ENCOUNTER — Telehealth: Payer: Self-pay | Admitting: Hematology and Oncology

## 2015-04-02 VITALS — BP 117/71 | HR 79 | Temp 97.8°F | Resp 17 | Ht 65.0 in | Wt 146.4 lb

## 2015-04-02 DIAGNOSIS — C50211 Malignant neoplasm of 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 breast: Secondary | ICD-10-CM

## 2015-04-02 NOTE — Progress Notes (Signed)
Unable to get in to exam room prior to MD.  No assessment performed.  

## 2015-04-02 NOTE — Telephone Encounter (Signed)
appt made and avs printed °
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
## 2015-04-17 NOTE — Progress Notes (Signed)
Received Signed Authorization to release medical information from patient  To send records to [place], [place],  [facility], [place] [address].  Faxed all records for Dr. York Cerise only to  [phone].  Called patient to advise cannot send for Dr. Kathlen Brunswick.  Left message.

## 2015-05-14 ENCOUNTER — Encounter: Payer: Self-pay | Admitting: Nurse Practitioner

## 2015-05-14 ENCOUNTER — Ambulatory Visit (HOSPITAL_BASED_OUTPATIENT_CLINIC_OR_DEPARTMENT_OTHER): Payer: 59 | Admitting: Nurse Practitioner

## 2015-05-14 VITALS — BP 128/51 | HR 77 | Temp 98.1°F | Resp 20 | Ht 65.0 in | Wt 143.7 lb

## 2015-05-14 DIAGNOSIS — D0511 Intraductal carcinoma in situ of right breast: Secondary | ICD-10-CM

## 2015-05-14 DIAGNOSIS — N899 Noninflammatory disorder of vagina, unspecified: Secondary | ICD-10-CM

## 2015-05-14 DIAGNOSIS — C50211 Malignant neoplasm of 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 breast: Secondary | ICD-10-CM

## 2015-05-14 NOTE — Progress Notes (Signed)
CLINIC:  Cancer Survivorship   REASON FOR VISIT:  Routine follow-up post-treatment for a recent history of breast cancer.  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 Planned duration of therapy 5 years    INTERVAL HISTORY:  Ms. Abeln presents to the [facility] today for our initial meeting to review her survivorship care plan detailing her treatment course for breast cancer, as well as monitoring long-term side effects of that treatment, education regarding health maintenance, screening, and overall wellness and health promotion.     Overall, Ms. Buenaventura reports feeling quite well since completing her radiation therapy approximately three months ago.  She denies any fatigue or changes in her breast. She has had no headache, cough, shortness of breath or bone pain.  She has a good appetite and denies any weight loss.  She has some vaginal dryness and discomfort. She is having hot flashes secondary to the tamoxifen, but states that they are bearable at this time.  She has some difficulty with concentrating. She has taken extensive  dietary supplements in the past, but recently discontinued many of the vitamins at the recommendation of her PCP.  She had been taking allergy shots but stopped these during radiation (01/2015) but plans to resume them in July 2017 (if not earlier pending her seasonal allergies).She continues to see her rheumatologist, Dr. Trudie Reed. She is scheduled for bone density testing next month at [facility] with Dr. Nelda Marseille  REVIEW OF SYSTEMS:  General: Hot flashes, as above. Denies fever, chills, unintentional weight loss, or night sweats. HEENT: Seasonal allergies, as above. Denies visual changes, hearing loss, mouth sores or difficulty swallowing. Cardiac: Denies palpitations, chest pain, and lower extremity edema.  Respiratory: Denies wheeze or dyspnea on exertion.  Breast: As above. GI: Denies abdominal pain, constipation, diarrhea, nausea, or vomiting.  GU: As above. Musculoskeletal: Denies joint or bone pain.  Neuro: Denies recent fall or numbness / tingling in her extremities. Skin: Denies rash, pruritis, or open wounds.  Psych: Difficulty with concentration, as above.  Denies depression, anxiety, insomnia, or memory loss.   A 14-point review of systems was completed and was negative, except as noted above.   ONCOLOGY TREATMENT TEAM:  1. Surgeon:  Dr. Marlou Starks at [facility]  2. Medical Oncologist: Dr. Lindi Adie 3. Radiation Oncologist: Dr. Sondra Come    PAST MEDICAL/SURGICAL HISTORY:  Past Medical History  Diagnosis Date  . Breast cancer of 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 breast ([place]) 11/13/2014  . Thyroid disease   . H/O bilateral inguinal hernia repair 1992  . GERD (gastroesophageal reflux disease)   . Migraines   . Hypercholesteremia   . Allergy   . Asthma   . Plantar fasciitis, bilateral   .  Spinal stenosis   . Radiation 01/12/15-02/04/15    right breast 42.17 gray   Past Surgical History  Procedure Laterality Date  . Tonsilectomy/adenoidectomy with myringotomy    . Bladder  suspension    . Removal of basal cell carcinoma (nose)    . Neuromas removed from right foot    . Nasal septum surgery    . Tonsillectomy    . Hernia repair    . Blepharoplasty Bilateral   . Breast lumpectomy with radioactive seed localization Right 12/10/2014    Procedure: RIGHT BREAST LUMPECTOMY WITH RADIOACTIVE SEED LOCALIZATION;  Surgeon: Autumn Messing III, MD;  Location[place] [facility] [facility];  Service: General;  Laterality: Right;  . 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 Foot surgery      plantar fashiectomies from both feet     ALLERGIES:  Allergies  Allergen Reactions  . Keflex [Cephalexin]     Puffy eyes and lips  . Toradol [Ketorolac Tromethamine]     Puffy lips and eyes     CURRENT MEDICATIONS:  Current Outpatient Prescriptions on File Prior to Visit  Medication Sig Dispense Refill  . albuterol (VENTOLIN HFA) 108 (90 Base) MCG/ACT inhaler Inhale 2 puffs into the lungs every 4 (four) hours as needed for wheezing or shortness of breath (cough). 1 Inhaler 2  . Biotin w/ Vitamins C & E (HAIR SKIN & NAILS GUMMIES PO) Take 1 each by mouth daily.    . calcium carbonate (CALCIUM 600) 1500 (600 CA) MG TABS tablet Take by mouth 2 (two) times daily with a meal.    . cetirizine (ZYRTEC) 10 MG tablet Take 10 mg by mouth daily.    . Cholecalciferol (D-3-5) 5000 UNITS capsule Take 5,000 Units by mouth daily.    . Coenzyme Q10 (CO Q-10) 100 MG CAPS Take 1 capsule by mouth daily.     Marland Kitchen EPINEPHrine (EPIPEN 2-PAK) 0.3 mg/0.3 mL IJ SOAJ injection Inject into the muscle once. Reported on 01/20/2015    . fluticasone (FLONASE) 50 MCG/ACT nasal spray Place 2 sprays into both nostrils daily. 51 g 1  . Lansoprazole (PREVACID PO) Take 30 mg by mouth.    . levothyroxine (SYNTHROID, LEVOTHROID) 100 MCG tablet Take 100 mcg by mouth daily before breakfast.    . Multiple Vitamins-Minerals (CENTRUM SILVER ADULT 50+ PO) Take by mouth.    . non-metallic deodorant Jethro Poling) MISC Apply 1  application topically daily as needed. Reported on 03/12/2015    . Olopatadine HCl 0.6 % SOLN Use 1-2 sprays in each nostril once daily in the evening as needed for runny nose or congestion 3 Bottle 1  . Omega-3 Fatty Acids (FISH OIL) 1000 MG CAPS Take by mouth.    . Probiotic Product (SOLUBLE FIBER/PROBIOTICS PO) Take 1 capsule by mouth daily.     . rosuvastatin (CRESTOR) 20 MG tablet Take 20 mg by mouth daily. Reported on 01/20/2015    . SUMAtriptan (IMITREX) 25 MG tablet Take 25 mg by mouth every 2 (two) hours as needed for migraine. Reported on 01/20/2015    . tamoxifen (NOLVADEX) 20 MG tablet Take 1 tablet (20 mg total) by mouth daily. 90 tablet 3  . tolterodine (DETROL LA) 4 MG 24 hr capsule Take 4 mg by mouth daily.    . TURMERIC CURCUMIN PO Take 1,000 mg by mouth daily.      No current facility-administered medications on file prior to visit.     ONCOLOGIC FAMILY HISTORY:  Family  History  Problem Relation Age of Onset  . Pancreatic cancer Maternal Aunt     dx. 40s-early 14s  . Congestive Heart Failure Mother 83  . Lung disease Mother   . Colon polyps Mother     unspecified number  . Dementia Father     Lewy Body Dementia  . Multiple sclerosis Sister   . Aneurysm Maternal Grandmother     brain  . Heart attack Maternal Grandfather   . Emphysema Paternal Grandfather   . Heart Problems Paternal Grandfather   . Other Sister     3 sisters have history of cysts in uterus  . Breast cancer Cousin     dx. 45-50  . Testicular cancer Cousin     dx. 31s  . Colon polyps Daughter     unspecified number     GENETIC COUNSELING/TESTING: Yes, performed 11/21/2014: Breast/Ovarian ([facility]): No clinically significant variants at ATM, BARD1, BRCA1, BRCA2, BRIP1, CDH1, CHEK2, FANCC, MLH1, MSH2, MSH6, NBN, PALB2, PMS2, PTEN, RAD51C, RAD51D, TP53, and XRCC2    SOCIAL HISTORY:  Ebone Alcivar is married and lives with her spouse in [place], [place].  She has 2 children. Ms.  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  She denies any current or history of tobacco, alcohol, or illicit drug use.     PHYSICAL EXAMINATION:  Vital Signs: Filed Vitals:   05/14/15 0834  BP: 128/51  Pulse: 77  Temp: 98.1 F (36.7 C)  Resp: 20   ECOG Performance Status: 0  General: Well-nourished, well-appearing female in no acute distress.  She is unaccompanied in clinic today.   HEENT: Head is atraumatic and normocephalic.  Pupils equal and reactive to light and accomodation. Conjunctivae clear without exudate.  Sclerae anicteric. Oral mucosa is pink, moist, and intact without lesions.  Oropharynx is pink without lesions or erythema.  Lymph: No cervical, supraclavicular, infraclavicular, or axillary lymphadenopathy noted on palpation.  Cardiovascular: Regular rate and rhythm without murmurs, rubs, or gallops. Respiratory: Clear to auscultation bilaterally. Chest expansion symmetric without accessory muscle use on inspiration or expiration.  GI: Abdomen soft and round. No tenderness to palpation. Bowel sounds normoactive in 4 quadrants.  GU: Deferred.  Neuro: No focal deficits. Steady gait.  Psych: Mood and affect normal and appropriate for situation.  Extremities: No edema, cyanosis, or clubbing.  Skin: Warm and dry. No open lesions noted.   LABORATORY DATA:  None for this visit.  DIAGNOSTIC IMAGING:  None for this visit.     ASSESSMENT AND PLAN:   1. 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  Ms. Abshier is doing well without clinical symptoms worrisome for disease recurrence. She will follow-up with her medical oncologist,  Dr. Lindi Adie, in September 2017 with history  and physical examination per surveillance protocol, as well as her radiation oncologist, Dr. Sondra Come, and surgeon, Dr. Marlou Starks.  She will continue her anti-estrogen therapy with tamoxifen at this time and report new or increased side effects of the medication. Though the incidence is low, there is an associated risk of endometrial cancer with anti-estrogen therapies like Tamoxifen.  Ms. Artiga was encouraged to contact us with any vaginal bleeding while taking Tamoxifen. Other side effects of Tamoxifen were again reviewed with her as  well. A comprehensive survivorship care plan and treatment summary was reviewed with the patient today detailing her breast cancer diagnosis, treatment course, potential late/long-term effects of treatment, appropriate follow-up care with recommendations for the future, and patient education resources.  A copy of this summary, along with a letter will be sent to the patient's primary care provider via in basket message after today's visit.  Ms. Molenda is welcome to return to the Survivorship Clinic in the future, as needed; no follow-up will be scheduled at this time.    2. Vaginal dryness: We discussed interventions to help manage this including the use of vaginal lubricants and moisturizers.  She will report if these are not effective.  We also discussed the "Healthy Pelvic floor and Intimacy" class that she will consider attending.  3. Cancer screening:  Due to Ms. Delillo's history and her age, she should receive screening for skin cancers, colon cancer, and gynecologic cancers.  The information and recommendations are listed on the patient's comprehensive care plan/treatment summary and were reviewed in detail with the patient.    4. Health maintenance and wellness promotion: Ms. Koffman was encouraged to consume 5-7 servings of fruits and vegetables per day. We reviewed the "Nutrition Rainbow" handout, as well as discussed recommendations to maximize nutrition and  minimize recurrence, such as increased intake of fruits, vegetables, lean proteins, and minimizing the intake of red meats and processed foods.  She was also encouraged to engage in moderate to vigorous exercise for 30 minutes per day most days of the week. We discussed the [employer] fitness program, which is designed for cancer survivors to help them become more physically fit after cancer treatments.  She was instructed to limit her alcohol consumption and continue to abstain from tobacco use.  A copy of the "Take Control of Your Health" brochure was given to her reinforcing these recommendations.   5. Support services/counseling: It is not uncommon for this period of the patient's cancer care trajectory to be one of many emotions and stressors.  We discussed an opportunity for her to participate in the next session of [facility] ("Finding Your New Normal") support group series designed for patients after they have completed treatment. Ms. Iten was encouraged to take advantage of our many other support services programs, support groups, and/or counseling in coping with her new life as a cancer survivor after completing anti-cancer treatment. She was offered support today through active listening and expressive supportive counseling. She was given information regarding our available services and encouraged to contact me with any questions or for help enrolling in any of our support group/programs.    A total of 60 minutes of face-to-face time was spent with this patient with greater than 50% of that time in counseling and care-coordination.   Sylvan Cheese, NP  Survivorship Program [facility] [phone]   Note: PRIMARY CARE PROVIDER Gerrit Heck, [place] [phone]

## 2015-09-10 ENCOUNTER — Ambulatory Visit: Payer: Self-pay | Admitting: Radiation Oncology

## 2015-09-14 ENCOUNTER — Other Ambulatory Visit: Payer: Self-pay | Admitting: Allergy and Immunology

## 2015-09-17 ENCOUNTER — Ambulatory Visit
Admission: RE | Admit: 2015-09-17 | Discharge: 2015-09-17 | Disposition: A | Discharge status: Home / Self Care | Payer: 59 | Source: Ambulatory Visit | Attending: Radiation Oncology | Admitting: Radiation Oncology

## 2015-09-17 ENCOUNTER — Encounter: Payer: Self-pay | Admitting: Radiation Oncology

## 2015-09-17 VITALS — BP 118/58 | HR 92 | Temp 98.1°F | Resp 16 | Ht 65.0 in | Wt 143.3 lb

## 2015-09-17 DIAGNOSIS — Z170 Estrogen receptor positive status [ER+]: Secondary | ICD-10-CM | POA: Insufficient documentation

## 2015-09-17 DIAGNOSIS — C50211 Malignant neoplasm of 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 breast: Secondary | ICD-10-CM

## 2015-09-17 DIAGNOSIS — C50911 Malignant neoplasm of unspecified site of right female breast: Secondary | ICD-10-CM | POA: Diagnosis not present

## 2015-09-17 NOTE — Progress Notes (Signed)
Radiation Oncology         [phone] ________________________________  Name: Valerie Snyder MRN: [id number]  Date: 09/17/2015  DOB: 02-07-1950  Follow-Up Visit Note  CC: Gerrit Heck, MD  Autumn Messing III, MD  Diagnosis:   Right breast low grade DCIS, ER+, PR+  Interval Since Last Radiation:  8 months.  01/12/2015-02/04/2015:  Right breast, 42.72 gray in 16 fractions (hypo-fractionated treatment).  Narrative:  The patient returns today for routine follow-up. The patient denies pain and is using vitamin E lotion BID to the treatment area. She has a good appetite. She started Tamoxifen on 03/12/15 and has mild hot flashes. She denies problems with right arm mobility or fatigue.  ALLERGIES:  is allergic to keflex [cephalexin] and toradol [ketorolac tromethamine].  Meds: Current Outpatient Prescriptions  Medication Sig Dispense Refill  . calcium carbonate (CALCIUM 600) 1500 (600 CA) MG TABS tablet Take by mouth 2 (two) times daily with a meal.    . cetirizine (ZYRTEC) 10 MG tablet Take 10 mg by mouth daily.    . Cholecalciferol (D-3-5) 5000 UNITS capsule Take 5,000 Units by mouth daily.    . Coenzyme Q10 (CO Q-10) 100 MG CAPS Take 1 capsule by mouth daily.     . fluticasone (FLONASE) 50 MCG/ACT nasal spray USE 2 SPRAYS IN EACH NOSTRIL DAILY 48 g 0  . Lansoprazole (PREVACID PO) Take 30 mg by mouth.    . levothyroxine (SYNTHROID, LEVOTHROID) 100 MCG tablet Take 100 mcg by mouth daily before breakfast.    . Multiple Vitamins-Minerals (CENTRUM SILVER ADULT 50+ PO) Take by mouth.    . Olopatadine HCl 0.6 % SOLN Use 1-2 sprays in each nostril once daily in the evening as needed for runny nose or congestion 3 Bottle 1  . Omega-3 Fatty Acids (FISH OIL) 1000 MG CAPS Take by mouth.    . Probiotic Product (SOLUBLE FIBER/PROBIOTICS PO) Take 1 capsule by mouth daily.     . rosuvastatin (CRESTOR) 20 MG tablet Take 20 mg by mouth daily. Reported on 01/20/2015    . tamoxifen (NOLVADEX)  20 MG tablet Take 1 tablet (20 mg total) by mouth daily. 90 tablet 3  . tolterodine (DETROL LA) 4 MG 24 hr capsule Take 4 mg by mouth daily.    . TURMERIC CURCUMIN PO Take 1,000 mg by mouth daily.     Marland Kitchen albuterol (VENTOLIN HFA) 108 (90 Base) MCG/ACT inhaler Inhale 2 puffs into the lungs every 4 (four) hours as needed for wheezing or shortness of breath (cough). (Patient not taking: Reported on 09/17/2015) 1 Inhaler 2  . Biotin w/ Vitamins C & E (HAIR SKIN & NAILS GUMMIES PO) Take 1 each by mouth daily.    Marland Kitchen EPINEPHrine (EPIPEN 2-PAK) 0.3 mg/0.3 mL IJ SOAJ injection Inject into the muscle once. Reported on 01/20/2015    . SUMAtriptan (IMITREX) 25 MG tablet Take 25 mg by mouth every 2 (two) hours as needed for migraine. Reported on 01/20/2015     No current facility-administered medications for this encounter.     Physical Findings: The patient is in no acute distress. Patient is alert and oriented.  height is 5\' 5"  (1.651 m) and weight is 143 lb 4.8 oz (65 kg). Her oral temperature is 98.1 F (36.7 C). Her blood pressure is 118/58 (abnormal) and her pulse is 92. Her respiration is 16 and oxygen saturation is 97%.   The lungs are clear to auscultation. The heart has a regular rhythm and rate. No palpable subclavicular  or axillary adenopathy. Mild hyperpigmentation changes in the right breast. No dominant mass, nipple discharge, or bleeding. No mass noted in the left breast.  Lab Findings: Lab Results  Component Value Date   WBC 6.8 11/19/2014   HGB 13.1 11/19/2014   HCT 39.3 11/19/2014   MCV 88.3 11/19/2014   PLT 209 11/19/2014    Radiographic Findings: No results found.  Impression:   no evidence of recurrence on clinical exam today.  Plan:  She is scheduled to follow up with Dr. Lindi Adie on 10/12/15 and is scheduled for a mammogram in October. PRN follow up with radiation oncology.  ____________________________________ -----------------------------------  Blair Promise, PhD,  MD    This document serves as a record of services personally performed by Gery Pray, MD. It was created on his behalf by Lendon Collar, a trained medical scribe. The creation of this record is based on the scribe's personal observations and the provider's statements to them. This document has been checked and approved by the attending provider.

## 2015-09-17 NOTE — Progress Notes (Signed)
Valerie Snyder is here for six month follow up visit for right breast cancer.  Skin to right breast with mild hyperpigmentation,using lotion with vitamin E bid.  Appetite is good.  Denies pain.  Started Tamoxifen 03-12-2015.  To have a mammogram in 11-2015 will see Dr. Leighton Ruff who usually orders her mammogram.  Will see Dr. Lindi Adie 10-12-15 medical oncologist..  Did survivorship 05-16-15 with Chestine Spore, PA-C Mackey, PA-C.  No problems with right arm mobility or fatigue doing exercise daily except Sunday. BP (!) 118/58 (BP Location: Left Arm, Patient Position: Sitting, Cuff Size: Normal)   Pulse 92   Temp 98.1 F (36.7 C) (Oral)   Resp 16   Ht 5\' 5"  (1.651 m)   Wt 143 lb 4.8 oz (65 kg)   SpO2 97%   BMI 23.85 kg/m

## 2015-09-21 ENCOUNTER — Other Ambulatory Visit: Payer: Self-pay | Admitting: Gastroenterology

## 2015-10-02 ENCOUNTER — Ambulatory Visit: Payer: 59 | Admitting: Hematology and Oncology

## 2015-10-02 ENCOUNTER — Ambulatory Visit: Payer: Self-pay | Admitting: Hematology and Oncology

## 2015-10-02 ENCOUNTER — Ambulatory Visit (HOSPITAL_BASED_OUTPATIENT_CLINIC_OR_DEPARTMENT_OTHER): Payer: 59 | Admitting: Hematology and Oncology

## 2015-10-02 ENCOUNTER — Encounter: Payer: Self-pay | Admitting: Hematology and Oncology

## 2015-10-02 ENCOUNTER — Telehealth: Payer: Self-pay | Admitting: Hematology and Oncology

## 2015-10-02 DIAGNOSIS — Z79810 Long term (current) use of selective estrogen receptor modulators (SERMs): Secondary | ICD-10-CM

## 2015-10-02 DIAGNOSIS — Z170 Estrogen receptor positive status [ER+]: Secondary | ICD-10-CM

## 2015-10-02 DIAGNOSIS — C50211 Malignant neoplasm of 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 breast: Secondary | ICD-10-CM

## 2015-10-02 NOTE — Progress Notes (Signed)
Patient Care Team: Leighton Ruff, MD as PCP - General (Family Medicine) Autumn Messing III, MD as Consulting Physician (General Surgery) Nicholas Lose, MD as Consulting Physician (Hematology and Oncology) Gery Pray, MD as Consulting Physician (Radiation Oncology) Mauro Kaufmann, RN as Registered Nurse Rockwell Germany, RN as Registered Nurse Sylvan Cheese, NP as Nurse Practitioner (Hematology and Oncology) Gavin Pound, MD as Consulting Physician (Rheumatology)  DIAGNOSIS: Breast cancer of 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 breast [facility])   Staging form: Breast, AJCC 7th Edition   - Clinical stage from 11/19/2014: Stage 0 (Tis (DCIS), N0, M0) - Unsigned   - Pathologic stage from 12/11/2014: Stage Unknown ([place], NX, cM0) - Unsigned         Staging comments: Staged on surgical specimen by Dr. Donato Heinz  SUMMARY OF ONCOLOGIC HISTORY:   Breast cancer of 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 breast ([place])   11/06/2014 Mammogram    Right breast indeterminate calcifications upper inner right breast middle depth 0.4 x 0.4 x 0.3 cm      11/11/2014 Initial Diagnosis    Right breast biopsy upper outer quadrant: Low-grade DCIS involving intraductal papilloma and calcification, ER 100%, PR 100%      11/11/2014 Clinical Stage    Stage 0: Tis N0      11/21/2014 Procedure    Breast/Ovarian (Gene[facility]): No clinically significant variants at ATM, BARD1, BRCA1, BRCA2, BRIP1, CDH1, CHEK2, FANCC, MLH1, MSH2, MSH6, NBN, PALB2, PMS2, PTEN, RAD51C, RAD51D, TP53, and XRCC2      12/10/2014 Surgery    Rt Lumpectomy: Benign Breast tissue      12/10/2014 Pathologic Stage    Tx Nx      01/12/2015 - 02/04/2015 Radiation Therapy    Adj XRT: Right breast, 42.72 gray in 16 fractions (hypo-fractionated treatment)      03/12/2015 -  Anti-estrogen oral therapy    Tamoxifen 20 mg daily. Planned duration of therapy 5 years      05/14/2015 Survivorship    Survivorship visit completed       CHIEF  COMPLIANT: Follow-up on tamoxifen therapy  INTERVAL HISTORY: Valerie Snyder is a 8 year with above-mentioned history of right breast cancer. Lumpectomy radiation and is currently on tamoxifen therapy. She is tolerating tamoxifen extremely well. She stays very active and busy. She is starting on the [employer] live strong program as well. She denies any pain or discomfort in the breasts. She is going through a lot of emotional problems related to her husband's diagnosis of PTSD. She is going through counseling. Counseling has been helping her tremendously.  REVIEW OF SYSTEMS:   Constitutional: Denies fevers, chills or abnormal weight loss Eyes: Denies blurriness of vision Ears, nose, mouth, throat, and face: Denies mucositis or sore throat Respiratory: Denies cough, dyspnea or wheezes Cardiovascular: Denies palpitation, chest discomfort Gastrointestinal:  Denies nausea, heartburn or change in bowel habits Skin: Denies abnormal skin rashes Lymphatics: Denies new lymphadenopathy or easy bruising Neurological:Denies numbness, tingling or new weaknesses Behavioral/Psych: Emotional problems requiring counseling  Extremities: No lower extremity edema Breast:  denies any pain or lumps or nodules in either breasts All other systems were reviewed with the patient and are negative.  I have reviewed the past medical history, past surgical history, social history and family history with the patient and they are unchanged from previous note.  ALLERGIES:  is allergic to keflex [cephalexin] and toradol [ketorolac tromethamine].  MEDICATIONS:  Current Outpatient Prescriptions  Medication Sig Dispense Refill  . albuterol (VENTOLIN HFA) 108 (90  Base) MCG/ACT inhaler Inhale 2 puffs into the lungs every 4 (four) hours as needed for wheezing or shortness of breath (cough). (Patient not taking: Reported on 09/17/2015) 1 Inhaler 2  . Biotin w/ Vitamins C & E (HAIR SKIN & NAILS GUMMIES PO) Take 1 each by mouth  daily.    . calcium carbonate (CALCIUM 600) 1500 (600 CA) MG TABS tablet Take by mouth 2 (two) times daily with a meal.    . cetirizine (ZYRTEC) 10 MG tablet Take 10 mg by mouth daily.    . Cholecalciferol (D-3-5) 5000 UNITS capsule Take 5,000 Units by mouth daily.    . Coenzyme Q10 (CO Q-10) 100 MG CAPS Take 1 capsule by mouth daily.     Marland Kitchen EPINEPHrine (EPIPEN 2-PAK) 0.3 mg/0.3 mL IJ SOAJ injection Inject into the muscle once. Reported on 01/20/2015    . fluticasone (FLONASE) 50 MCG/ACT nasal spray USE 2 SPRAYS IN EACH NOSTRIL DAILY 48 g 0  . Lansoprazole (PREVACID PO) Take 30 mg by mouth.    . levothyroxine (SYNTHROID, LEVOTHROID) 100 MCG tablet Take 100 mcg by mouth daily before breakfast.    . Multiple Vitamins-Minerals (CENTRUM SILVER ADULT 50+ PO) Take by mouth.    . Olopatadine HCl 0.6 % SOLN Use 1-2 sprays in each nostril once daily in the evening as needed for runny nose or congestion 3 Bottle 1  . Omega-3 Fatty Acids (FISH OIL) 1000 MG CAPS Take by mouth.    . Probiotic Product (SOLUBLE FIBER/PROBIOTICS PO) Take 1 capsule by mouth daily.     . rosuvastatin (CRESTOR) 20 MG tablet Take 20 mg by mouth daily. Reported on 01/20/2015    . SUMAtriptan (IMITREX) 25 MG tablet Take 25 mg by mouth every 2 (two) hours as needed for migraine. Reported on 01/20/2015    . tamoxifen (NOLVADEX) 20 MG tablet Take 1 tablet (20 mg total) by mouth daily. 90 tablet 3  . tolterodine (DETROL LA) 4 MG 24 hr capsule Take 4 mg by mouth daily.    . TURMERIC CURCUMIN PO Take 1,000 mg by mouth daily.      No current facility-administered medications for this visit.     PHYSICAL EXAMINATION: ECOG PERFORMANCE STATUS: 1 - Symptomatic but completely ambulatory  Vitals:   10/02/15 1001  BP: (!) 124/59  Pulse: 78  Resp: 18  Temp: 98.3 F (36.8 C)   Filed Weights   10/02/15 1001  Weight: 146 lb 12.8 oz (66.6 kg)    GENERAL:alert, no distress and comfortable SKIN: skin color, texture, turgor are normal, no  rashes or significant lesions EYES: normal, Conjunctiva are pink and non-injected, sclera clear OROPHARYNX:no exudate, no erythema and lips, buccal mucosa, and tongue normal  NECK: supple, thyroid normal size, non-tender, without nodularity LYMPH:  no palpable lymphadenopathy in the cervical, axillary or inguinal LUNGS: clear to auscultation and percussion with normal breathing effort HEART: regular rate & rhythm and no murmurs and no lower extremity edema ABDOMEN:abdomen soft, non-tender and normal bowel sounds MUSCULOSKELETAL:no cyanosis of digits and no clubbing  NEURO: alert & oriented x 3 with fluent speech, no focal motor/sensory deficits EXTREMITIES: No lower extremity edema BREAST: No palpable masses or nodules in either right or left breasts. No palpable axillary supraclavicular or infraclavicular adenopathy no breast tenderness or nipple discharge. (exam performed in the presence of a chaperone)  LABORATORY DATA:  I have reviewed the data as listed   Chemistry      Component Value Date/Time   NA 141  11/19/2014 1247   K 4.6 11/19/2014 1247   CO2 27 11/19/2014 1247   BUN 16.6 11/19/2014 1247   CREATININE 0.8 11/19/2014 1247      Component Value Date/Time   CALCIUM 9.4 11/19/2014 1247   ALKPHOS 51 11/19/2014 1247   AST 22 11/19/2014 1247   ALT 25 11/19/2014 1247   BILITOT 0.38 11/19/2014 1247       Lab Results  Component Value Date   WBC 6.8 11/19/2014   HGB 13.1 11/19/2014   HCT 39.3 11/19/2014   MCV 88.3 11/19/2014   PLT 209 11/19/2014   NEUTROABS 4.7 11/19/2014     ASSESSMENT & PLAN:  Breast cancer of 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 breast ([place]) Right breast biopsy 11/11/2014 upper outer quadrant: 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 Rt Lumpectomy 12/10/14: Benign Adjuvant radiation: 01/12/2015 to  02/03/2014  Treatment plan: Antiestrogen therapy with tamoxifen 5 years started 03/12/2015  Tamoxifen Toxicities: Occasional hot flashes but not significant to interfere with her life  Patient stays very active with bicycling, swimming, extensive physical exercise regimen as well as to eating nutritious diet, taking supplements. I informed her that she should discuss with her primary care physician regarding what supplements she actually needs and what she does not need to take .  RTC 6 months   No orders of the defined types were placed in this encounter.  The patient has a good understanding of the overall plan. she agrees with it. she will call with any problems that may develop before the next visit here.   Rulon Eisenmenger, MD 10/02/15

## 2015-10-02 NOTE — Telephone Encounter (Signed)
appt made and avs printed °

## 2015-10-02 NOTE — Assessment & Plan Note (Signed)
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 Rt Lumpectomy 12/10/14: Benign Adjuvant radiation: 01/12/2015 to 02/03/2014  Treatment plan: Antiestrogen therapy with tamoxifen 5 years started 03/12/2015  Tamoxifen Toxicities: Occasional hot flashes but not significant to interfere with her life  Patient stays very active with bicycling, swimming, extensive physical exercise regimen as well as to eating nutritious diet, taking supplements. I informed her that she should discuss with her primary care physician regarding what supplements she actually needs and what she does not need to take .  RTC 6 months

## 2015-10-14 ENCOUNTER — Ambulatory Visit: Payer: 59 | Admitting: Allergy and Immunology

## 2015-10-27 ENCOUNTER — Encounter (INDEPENDENT_AMBULATORY_CARE_PROVIDER_SITE_OTHER): Payer: Self-pay

## 2015-10-27 ENCOUNTER — Ambulatory Visit (INDEPENDENT_AMBULATORY_CARE_PROVIDER_SITE_OTHER): Payer: 59 | Admitting: Allergy and Immunology

## 2015-10-27 ENCOUNTER — Encounter: Payer: Self-pay | Admitting: Allergy and Immunology

## 2015-10-27 VITALS — BP 132/74 | HR 64 | Resp 16

## 2015-10-27 DIAGNOSIS — J4520 Mild intermittent asthma, uncomplicated: Secondary | ICD-10-CM

## 2015-10-27 DIAGNOSIS — J3089 Other allergic rhinitis: Secondary | ICD-10-CM

## 2015-10-27 DIAGNOSIS — J387 Other diseases of larynx: Secondary | ICD-10-CM

## 2015-10-27 DIAGNOSIS — K219 Gastro-esophageal reflux disease without esophagitis: Secondary | ICD-10-CM

## 2015-10-27 MED ORDER — RANITIDINE HCL 300 MG PO TABS: 300.0000 mg | ORAL_TABLET | Freq: Every day | ORAL | 5 refills | Status: DC

## 2015-10-27 NOTE — Patient Instructions (Signed)
  1. Treat reflux:   A. consolidate all caffeine and chocolate consumption  B. continue lansoprazole 30 mg in a.m.  C. start ranitidine 300 mg in PM  2. Continue nasal fluticasone one-2 sprays each nostril one time per day  3. Continue the following if needed:   A. Zyrtec 10 mg one tablet once a day  B. olopatadine nasal spray 2 sprays each nostril one-2 times a day  C. nasal saline  D. Ventolin HFA 2 puffs every 4-6 hours  4. Obtain a fall flu vaccine  5. Return to clinic in 12 weeks or earlier if problem

## 2015-10-27 NOTE — Progress Notes (Signed)
Follow-up Note  Referring Provider: Leighton Ruff, MD Primary Provider: Gerrit Heck, MD Date of Office Visit: 10/27/2015  Subjective:   Valerie Snyder (DOB: August 31, 1950) is a 65 y.o. female who returns to the [facility] on 10/27/2015 in re-evaluation of the following:  HPI: Lemma returns to this clinic in reevaluation of her allergic rhinoconjunctivitis, intermittent asthma, and history of reflux. I have never seen her in this clinic before and she is a patient of Dr. Ishmael Holter having visited with Dr. Ishmael Holter in February 2017.  Nevin Bloodgood believes that her atopic respiratory disease is under pretty good control this point in time while consistently using Flonase and Zyrtec and Patanase. She has very little problems with her upper airways and has not required a systemic steroid to treat an episode of sinusitis.  Likewise she has had no problems with asthma and has rarely used a short-acting bronchodilator and can exercise without any difficulty. She has not required a systemic steroid to treat an exacerbation of this condition.  What is a persistent issue even in the face of medical therapy and even in the face of a prolonged history of using immunotherapy for 20 years which she discontinued last year is the fact that she has constant postnasal drip and a thickness in her throat that does not respond to any therapy. She's raspy in the morning. She does have reflux and she is using prevacid all 30 mg once a day. She consumes chocolate on a daily basis but no other forms of caffeine or alcohol. 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    Medication List      albuterol 108 (90 Base) MCG/ACT inhaler Commonly known as:  VENTOLIN HFA Inhale 2 puffs into the lungs every 4 (four) hours as needed for wheezing or shortness of breath (cough).     CALCIUM 600 1500 (600 Ca) MG Tabs tablet Generic drug:  calcium carbonate Take by mouth 2 (two) times daily with a meal.   CENTRUM SILVER ADULT 50+ PO Take by mouth.   cetirizine 10 MG tablet Commonly known as:  ZYRTEC Take 10 mg by mouth daily.   Co Q-10 100 MG Caps Take 1 capsule by mouth daily.   D-3-5 5000 units capsule Generic drug:  Cholecalciferol Take 5,000 Units by mouth daily.   EPIPEN 2-PAK 0.3 mg/0.3 mL Soaj injection Generic drug:  EPINEPHrine Inject into the muscle once. Reported on 01/20/2015   Fish Oil 1000 MG Caps Take by mouth.   fluticasone 50 MCG/ACT nasal spray Commonly known as:  FLONASE USE 2 SPRAYS IN EACH NOSTRIL DAILY   levothyroxine 100 MCG tablet Commonly known as:  SYNTHROID, LEVOTHROID Take 100 mcg by mouth daily before breakfast.   Olopatadine HCl 0.6 % Soln Use 1-2 sprays in each nostril once daily in the evening as needed for runny nose or congestion   PREVACID PO Take 30 mg by mouth.   rosuvastatin 20 MG tablet Commonly known as:  CRESTOR Take 20 mg by mouth daily. Reported on 01/20/2015   SOLUBLE FIBER/PROBIOTICS PO Take 1 capsule by mouth daily.   SUMAtriptan 25 MG tablet Commonly known as:  IMITREX Take 25 mg by mouth every 2 (two) hours as needed for migraine. Reported on 01/20/2015   tamoxifen 20 MG tablet Commonly known as:  NOLVADEX Take 1 tablet (20 mg total) by mouth  daily.   tolterodine 4 MG 24 hr capsule Commonly known as:  DETROL LA Take 4 mg by mouth daily.   TURMERIC CURCUMIN PO Take 1,000 mg by mouth daily.       Past Medical History:  Diagnosis Date  . Allergy   . Asthma   . 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 GERD (gastroesophageal reflux disease)   . H/O bilateral inguinal hernia repair 1992  . Hypercholesteremia   . Migraines   . Plantar fasciitis, bilateral   . Radiation 01/12/15-02/04/15   right breast 42.17 gray  . Spinal stenosis   .  Thyroid disease     Past Surgical History:  Procedure Laterality Date  . BLADDER SUSPENSION    . BLEPHAROPLASTY Bilateral   . BREAST LUMPECTOMY WITH RADIOACTIVE SEED LOCALIZATION Right 12/10/2014   Procedure: RIGHT BREAST LUMPECTOMY WITH RADIOACTIVE SEED LOCALIZATION;  Surgeon: Autumn Messing III, MD;  Location: [facility];  Service: General;  Laterality: Right;  . FOOT SURGERY     plantar fashiectomies from both feet  . HERNIA REPAIR    . NASAL SEPTUM SURGERY    . neuromas removed from right foot    . 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 removal of basal cell carcinoma (nose)    . TONSILECTOMY/ADENOIDECTOMY WITH MYRINGOTOMY    . TONSILLECTOMY      Allergies  Allergen Reactions  . Keflex [Cephalexin]     Puffy eyes and lips  . Toradol [Ketorolac Tromethamine]     Puffy lips and eyes    Review of systems negative except as noted in HPI / PMHx or noted below:  Review of Systems  Constitutional: Negative.   HENT: Negative.   Eyes: Negative.   Respiratory: Negative.   Cardiovascular: Negative.   Gastrointestinal: Negative.   Genitourinary: Negative.   Musculoskeletal: Negative.   Skin: Negative.   Neurological: Negative.   Endo/Heme/Allergies: Negative.   Psychiatric/Behavioral: Negative.      Objective:   Vitals:   10/27/15 1108  BP: 132/74  Pulse: 64  Resp: 16          Physical Exam  Constitutional: She is well-developed, well-nourished, and in no distress.  HENT:  Head: Normocephalic.  Right Ear: Tympanic membrane, external ear and ear canal normal.  Left Ear: Tympanic membrane, external ear and ear canal normal.  Nose: Nose normal. No mucosal edema or rhinorrhea.  Mouth/Throat: Uvula is midline, oropharynx is clear and moist and mucous membranes are normal. No oropharyngeal exudate.  Eyes: Conjunctivae are normal.  Neck: Trachea normal. No tracheal tenderness present. No tracheal deviation present. No thyromegaly present.   Cardiovascular: Normal rate, regular rhythm, S1 normal, S2 normal and normal heart sounds.   No murmur heard. Pulmonary/Chest: Breath sounds normal. No stridor. No respiratory distress. She has no wheezes. She has no rales.  Musculoskeletal: She exhibits no edema.  Lymphadenopathy:       Head (right side): No tonsillar adenopathy present.       Head (left side): No tonsillar adenopathy present.    She has no cervical adenopathy.  Neurological: She is alert. Gait normal.  Skin: No rash noted. She is not diaphoretic. No erythema. Nails show no clubbing.  Psychiatric: Mood and affect normal.    Diagnostics:    Spirometry was performed and demonstrated an FEV1 of 2.15 at 92 % of predicted.  The patient had an Asthma Control Test with the following results:  .  Assessment and Plan:   1. Other allergic rhinitis   2. Asthma, mild intermittent, well-controlled   3. LPRD (laryngopharyngeal reflux disease)     1. Treat reflux:   A. consolidate all caffeine and chocolate consumption  B. continue lansoprazole 30 mg in a.m.  C. start ranitidine 300 mg in PM  2. Continue nasal fluticasone one-2 sprays each nostril one time per day  3. Continue the following if needed:   A. Zyrtec 10 mg one tablet once a day  B. olopatadine nasal spray 2 sprays each nostril one-2 times a day  C. nasal saline  D. Ventolin HFA 2 puffs every 4-6 hours  4. Obtain a fall flu vaccine  5. Return to clinic in 12 weeks or earlier if problem  Sydne appears to have her atopic respiratory disease under pretty good control with her current medical therapy and asthma does not appear to be a very active issue at this point in time. I am going to have her be a little bit more aggressive about treating her reflux by eliminating her daily chocolate consumption and adding a H2 receptor blocker to her proton pump inhibitor for the next 12 weeks in the hope of eliminating her LPR. I'll regroup with her that point in  time and we'll consider further evaluation and treatment pending her response.  Allena Katz, MD [facility]

## 2015-10-30 ENCOUNTER — Encounter: Payer: Self-pay | Admitting: Neurology

## 2015-11-05 ENCOUNTER — Other Ambulatory Visit: Payer: Self-pay | Admitting: Hematology and Oncology

## 2015-11-05 DIAGNOSIS — Z853 Personal history of malignant neoplasm of breast: Secondary | ICD-10-CM

## 2015-11-11 ENCOUNTER — Ambulatory Visit
Admission: RE | Admit: 2015-11-11 | Discharge: 2015-11-11 | Disposition: A | Discharge status: Home / Self Care | Payer: 59 | Source: Ambulatory Visit | Attending: Hematology and Oncology | Admitting: Hematology and Oncology

## 2015-11-11 DIAGNOSIS — Z853 Personal history of malignant neoplasm of breast: Secondary | ICD-10-CM

## 2015-12-08 ENCOUNTER — Encounter: Payer: Self-pay | Admitting: *Deleted

## 2016-01-18 ENCOUNTER — Other Ambulatory Visit: Payer: Self-pay | Admitting: Hematology and Oncology

## 2016-01-18 DIAGNOSIS — C50211 Malignant neoplasm of 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 breast: Secondary | ICD-10-CM

## 2016-01-19 ENCOUNTER — Encounter (INDEPENDENT_AMBULATORY_CARE_PROVIDER_SITE_OTHER): Payer: Self-pay

## 2016-01-19 ENCOUNTER — Encounter: Payer: Self-pay | Admitting: Allergy and Immunology

## 2016-01-19 ENCOUNTER — Ambulatory Visit (INDEPENDENT_AMBULATORY_CARE_PROVIDER_SITE_OTHER): Payer: 59 | Admitting: Allergy and Immunology

## 2016-01-19 VITALS — BP 112/60 | HR 76 | Resp 16

## 2016-01-19 DIAGNOSIS — J4520 Mild intermittent asthma, uncomplicated: Secondary | ICD-10-CM | POA: Diagnosis not present

## 2016-01-19 DIAGNOSIS — J3089 Other allergic rhinitis: Secondary | ICD-10-CM | POA: Diagnosis not present

## 2016-01-19 DIAGNOSIS — K219 Gastro-esophageal reflux disease without esophagitis: Secondary | ICD-10-CM | POA: Diagnosis not present

## 2016-01-19 MED ORDER — RANITIDINE HCL 300 MG PO TABS: 300.0000 mg | ORAL_TABLET | Freq: Every day | ORAL | 1 refills | Status: DC

## 2016-01-19 MED ORDER — FLUTICASONE PROPIONATE 50 MCG/ACT NA SUSP: NASAL | 1 refills | Status: DC

## 2016-01-19 MED ORDER — OLOPATADINE HCL 0.6 % NA SOLN: NASAL | 1 refills | Status: DC

## 2016-01-19 NOTE — Patient Instructions (Signed)
  1. Continue to treat reflux:   A. Can determine if chocolate consumption is the problem  B. lansoprazole 30 mg in a.m.  C. ranitidine 300 mg in PM  2. Continue nasal fluticasone one-2 sprays each nostril one time per day  3. Continue the following if needed:   A. Zyrtec 10 mg one tablet once a day  B. olopatadine nasal spray 2 sprays each nostril one-2 times a day  C. nasal saline  D. Ventolin HFA 2 puffs every 4-6 hours  4. Evaluation of throat with ENT doctor for "ugly" mucous production  5. Return to clinic in 12 weeks or earlier if problem

## 2016-01-19 NOTE — Progress Notes (Signed)
Follow-up Note  Referring Provider: Leighton Ruff, MD Primary Provider: Gerrit Heck, MD Date of Office Visit: 01/19/2016  Subjective:   Valerie Snyder (DOB: 07/07/1950) is a 65 y.o. female who returns to the [facility] on 01/19/2016 in re-evaluation of the following:  HPI: Valerie Snyder returns to this clinic in reevaluation of her allergic rhinoconjunctivitis, intermittent asthma, and LPR. I last saw her in his clinic in September 2017.  While consistently refraining from consumption of caffeine and chocolate and consistent use of a proton pump inhibitor and H2 receptor blocker she has resolved all of her "gurgling" in her throat and is no longer raspy in the morning and does not have a tremendous amount of throat clearing. However, she still has this brownish postnasal drip that comes up at least one time per day and sometimes twice a day.  She's had no problems with her nose. She has an open airway and she can smell without any problem. She has not had any issues with her asthma. She has not required either a systemic steroid or an antibiotic to treat an airway issue. She rarely uses any bronchodilator. She does consistently use Flonase.   Allergies as of 01/19/2016      Reactions   Keflex [cephalexin]    Puffy eyes and lips   Toradol [ketorolac Tromethamine]    Puffy lips and eyes      Medication List      albuterol 108 (90 Base) MCG/ACT inhaler Commonly known as:  VENTOLIN HFA Inhale 2 puffs into the lungs every 4 (four) hours as needed for wheezing or shortness of breath (cough).   CALCIUM 600 1500 (600 Ca) MG Tabs tablet Generic drug:  calcium carbonate Take by mouth 2 (two) times daily with a meal.   CENTRUM SILVER ADULT 50+ PO Take by mouth.   cetirizine 10 MG tablet Commonly known as:  ZYRTEC Take 10 mg by mouth daily.   Co Q-10 100 MG Caps Take 1 capsule by mouth daily.   D-3-5 5000 units capsule Generic drug:   Cholecalciferol Take 5,000 Units by mouth daily.   EPIPEN 2-PAK 0.3 mg/0.3 mL Soaj injection Generic drug:  EPINEPHrine Inject into the muscle once. Reported on 01/20/2015   Fish Oil 1000 MG Caps Take by mouth.   fluticasone 50 MCG/ACT nasal spray Commonly known as:  FLONASE USE 2 SPRAYS IN EACH NOSTRIL DAILY   levothyroxine 100 MCG tablet Commonly known as:  SYNTHROID, LEVOTHROID Take 100 mcg by mouth daily before breakfast.   Olopatadine HCl 0.6 % Soln Use 1-2 sprays in each nostril once daily in the evening as needed for runny nose or congestion   PREVACID PO Take 30 mg by mouth.   ranitidine 300 MG tablet Commonly known as:  ZANTAC Take 1 tablet (300 mg total) by mouth at bedtime.   rosuvastatin 20 MG tablet Commonly known as:  CRESTOR Take 20 mg by mouth daily. Reported on 01/20/2015   SOLUBLE FIBER/PROBIOTICS PO Take 1 capsule by mouth daily.   SUMAtriptan 25 MG tablet Commonly known as:  IMITREX Take 25 mg by mouth every 2 (two) hours as needed for migraine. Reported on 01/20/2015   tamoxifen 20 MG tablet Commonly known as:  NOLVADEX TAKE 1 TABLET DAILY (START FEBRUARY 1ST)   tolterodine 4 MG 24 hr capsule Commonly known as:  DETROL LA Take 4 mg by mouth daily.   TURMERIC CURCUMIN PO Take 1,000 mg by mouth daily.  Past Medical History:  Diagnosis Date  . Allergy   . Asthma   . Breast cancer of 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 breast ([place]) 11/13/2014  . GERD (gastroesophageal reflux disease)   . H/O bilateral inguinal hernia repair 1992  . Hypercholesteremia   . Migraines   . Plantar fasciitis, bilateral   . Radiation 01/12/15-02/04/15   right breast 42.17 gray  . Spinal stenosis   . Thyroid disease     Past Surgical History:  Procedure Laterality Date  . BLADDER SUSPENSION    . BLEPHAROPLASTY Bilateral   . BREAST LUMPECTOMY WITH RADIOACTIVE SEED LOCALIZATION Right 12/10/2014   Procedure: RIGHT BREAST LUMPECTOMY WITH RADIOACTIVE  SEED LOCALIZATION;  Surgeon: Valerie Messing III, MD;  Location: [facility];  Service: General;  Laterality: Right;  . FOOT SURGERY     plantar fashiectomies from both feet  . HERNIA REPAIR    . NASAL SEPTUM SURGERY    . neuromas removed from right foot    . 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 removal of basal cell carcinoma (nose)    . TONSILECTOMY/ADENOIDECTOMY WITH MYRINGOTOMY    . TONSILLECTOMY      Review of systems negative except as noted in HPI / PMHx or noted below:  Review of Systems  Constitutional: Negative.   HENT: Negative.   Eyes: Negative.   Respiratory: Negative.   Cardiovascular: Negative.   Gastrointestinal: Negative.   Genitourinary: Negative.   Musculoskeletal: Negative.   Skin: Negative.   Neurological: Negative.   Endo/Heme/Allergies: Negative.   Psychiatric/Behavioral: Negative.      Objective:   Vitals:   01/19/16 1343  BP: 112/60  Pulse: 76  Resp: 16          Physical Exam  Constitutional: She is well-developed, well-nourished, and in no distress.  HENT:  Head: Normocephalic.  Right Ear: Tympanic membrane, external ear and ear canal normal.  Left Ear: Tympanic membrane, external ear and ear canal normal.  Nose: Nose normal. No mucosal edema or rhinorrhea.  Mouth/Throat: Uvula is midline, oropharynx is clear and moist and mucous membranes are normal. No oropharyngeal exudate.  Eyes: Conjunctivae are normal.  Neck: Trachea normal. No tracheal tenderness present. No tracheal deviation present. No thyromegaly present.  Cardiovascular: Normal rate, regular rhythm, S1 normal, S2 normal and normal heart sounds.   No murmur heard. Pulmonary/Chest: Breath sounds normal. No stridor. No respiratory distress. She has no wheezes. She has no rales.  Musculoskeletal: She exhibits no edema.  Lymphadenopathy:       Head (right side): No tonsillar adenopathy present.       Head (left side): No tonsillar adenopathy present.    She  has no cervical adenopathy.  Neurological: She is alert. Gait normal.  Skin: No rash noted. She is not diaphoretic. No erythema. Nails show no clubbing.  Psychiatric: Mood and affect normal.    Diagnostics: none   Assessment and Plan:   1. Asthma, mild intermittent, well-controlled   2. Other allergic rhinitis   3. LPRD (laryngopharyngeal reflux disease)     1. Continue to treat reflux:   A. Can determine if chocolate consumption is the problem  B. lansoprazole 30 mg in a.m.  C. ranitidine 300 mg in PM  2. Continue nasal fluticasone one-2 sprays each nostril one time per day  3. Continue the following if needed:   A. Zyrtec 10 mg one tablet once a day  B. olopatadine nasal spray 2 sprays each nostril  one-2 times a day  C. nasal saline  D. Ventolin HFA 2 puffs every 4-6 hours  4. Evaluation of throat with ENT doctor for "ugly" mucous production  5. Return to clinic in 12 weeks or earlier if problem  There is no doubt that Porter is much better while aggressively treating her reflux. She has a strong desire to restart her daily chocolate consumption using 1 ounce per day and she can certainly restart chocolate and see if this results in return of the issue with her raspy voice and gurgling and throat clearing. I would like for her airway to be thoroughly evaluated by an ENT doctor using rhinoscopy to identify why she still makes this ugly mucus production. We'll keep her on nasal fluticasone as this has resulting in very good control of her upper airway inflammation. Of course she will use a short acting bronchodilator as needed. I'll see her back in this clinic in 12 weeks at which time there may be an opportunity to consolidate her medical treatment.   Allena Katz, MD [place]

## 2016-01-20 ENCOUNTER — Telehealth: Payer: Self-pay | Admitting: Neurology

## 2016-01-20 NOTE — Telephone Encounter (Signed)
Pt called, she wanted RN and Dr Rexene Alberts to know her husband is coming with her tomorrow but she would like to speak in private without him. If Rn could call her and tell husband he will need to wait, she said he is very unpredictable and may not even come to the appt.

## 2016-01-20 NOTE — Telephone Encounter (Signed)
Noted, thanks, will also send to Dr. Rexene Alberts.

## 2016-01-21 ENCOUNTER — Ambulatory Visit (INDEPENDENT_AMBULATORY_CARE_PROVIDER_SITE_OTHER): Payer: 59 | Admitting: Neurology

## 2016-01-21 ENCOUNTER — Encounter: Payer: Self-pay | Admitting: Neurology

## 2016-01-21 VITALS — BP 118/72 | HR 80 | Resp 20 | Ht 65.0 in | Wt 140.0 lb

## 2016-01-21 DIAGNOSIS — R251 Tremor, unspecified: Secondary | ICD-10-CM | POA: Diagnosis not present

## 2016-01-21 NOTE — Progress Notes (Signed)
Subjective:    Patient ID: Farryn Linares is a 64 y.o. female.  HPI     Star Age, MD, PhD [facility] [address] [place], [place] [address]  Dear Dr. Drema Dallas,   I saw your patient, Lavren Lewan, upon your kind request in my neurologic clinic today for initial consultation of her tremors. The patient is unaccompanied today. As you know, Ms. Zynda is a 65 year old right-handed woman with an underlying medical history of breast cancer (s/p right lumpectomy and XRT, no chemotherapy, on tamoxifen), history of hypothyroidism, hyperlipidemia, reflux disease with hiatal hernia, migraines, allergies, plantar fasciitis, spinal stenosis, history of kidney stones, osteoarthritis, depression, trigeminal neuralgia, basal cell cancer and anxiety, who reports an approximately two to 3 month history of hand tremors but also tremor affecting her legs, trunk, neck area. Looking back, she has noticed a tremor in the past, one or 2 years ago when doing fine motor tasks such as applying mascara. She has had in the past additional stressors as she was taking care of her elderly mother. She is worried about developing Parkinson's disease. I reviewed your office note from 12/28/2015. You noticed a hand tremor right more than left and a mild voice tremor. You felt that her tremor could be due to anxiety. She has no FHx of tremors or PD, one sister with MS, 2 sisters with fibromyalgia, 3 sisters with migraines. She has 5 sisters and 2 brothers, most siblings with depression and/or anxiety and with OA.  She has had significant stressors in the past year, particularly with her husband's health and mental health from what I understand. Patient has been in counseling and has been very active socially and with her church. Marriage counseling has been discussed as well, but they are not yet eligible for it, as I understand. She does endorse occasional anxiety but has  not been on any medication for this. She is physically very active, she does tai chi, yoga, water aerobics, swimming, cycling, weight lifts, line dancing. She usually exercises one or 2 hours per day, typically between 9 and 11 AM. Prior to her cancer diagnosis she was use to walking 3-4 miles per day. She has 2 biological children and 2 stepchildren. She is retired. She lives with her husband and one of her 2 stepsons. She is a nonsmoker, does not drink alcohol or use illicit drugs and does not use caffeine on a daily basis. She sleeps fairly well. She hydrates well with water. She had recent blood work on 10/30/2015 which we reviewed. TSH was normal, liver function test was normal, BMP normal, lipid profile normal. Tremor tends to be worse when she has been around her husband at times.   Her Past Medical History Is Significant For: Past Medical History:  Diagnosis Date  . Allergy   . Asthma   . Breast cancer of 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 breast ([place]) 11/13/2014  . GERD (gastroesophageal reflux disease)   . H/O bilateral inguinal hernia repair 1992  . Hypercholesteremia   . Migraines   . Plantar fasciitis, bilateral   . Radiation 01/12/15-02/04/15   right breast 42.17 gray  . Spinal stenosis   . Thyroid disease     Her Past Surgical History Is Significant For: Past Surgical History:  Procedure Laterality Date  . BLADDER SUSPENSION    . BLEPHAROPLASTY Bilateral   . BREAST LUMPECTOMY WITH RADIOACTIVE SEED LOCALIZATION Right 12/10/2014   Procedure: RIGHT BREAST LUMPECTOMY WITH RADIOACTIVE SEED LOCALIZATION;  Surgeon: Chevis Pretty III, MD;  Location: [facility];  Service: General;  Laterality: Right;  . FOOT SURGERY     plantar fashiectomies from both feet  . HERNIA REPAIR    . NASAL SEPTUM SURGERY    . neuromas removed from right foot    . 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 removal of basal cell carcinoma (nose)    . TONSILECTOMY/ADENOIDECTOMY WITH  MYRINGOTOMY    . TONSILLECTOMY      Her Family History Is Significant For: Family History  Problem Relation Age of Onset  . Pancreatic cancer Maternal Aunt     dx. 40s-early 50s  . Congestive Heart Failure Mother 55  . Lung disease Mother   . Colon polyps Mother     unspecified number  . Dementia Father     Lewy Body Dementia  . Kidney failure Father   . Stroke Father   . Depression Father   . Multiple sclerosis Sister   . Depression Sister   . Aneurysm Maternal Grandmother     brain  . Heart attack Maternal Grandfather   . Emphysema Paternal Grandfather   . Heart Problems Paternal Grandfather   . Other Sister     3 sisters have history of cysts in uterus  . Depression Sister   . Breast cancer Cousin     dx. 45-50  . Testicular cancer Cousin     dx. 30s  . Colon polyps Daughter     unspecified number    Her Social History Is Significant For: Social History   Social History  . Marital status: Married    Spouse name: N/A  . Number of children: 2  . Years of education: N/A   Occupational History  . 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 Smoking status: Never Smoker  . Smokeless tobacco: Never Used  . Alcohol use No  . Drug use: No  . Sexual activity: Not Asked   Other Topics Concern  . None   Social History Narrative  . None    Her Allergies Are:  Allergies  Allergen Reactions  . Keflex [Cephalexin]     Puffy eyes and lips  . Toradol [Ketorolac Tromethamine]     Puffy lips and eyes  :   Her Current Medications Are:  Outpatient Encounter Prescriptions as of 01/21/2016  Medication Sig  . albuterol (VENTOLIN HFA) 108 (90 Base) MCG/ACT inhaler Inhale 2 puffs into the lungs every 4 (four) hours as needed for wheezing or shortness of breath (cough).  . calcium carbonate (CALCIUM 600) 1500 (600 CA) MG TABS tablet Take by mouth 2 (two) times daily with a meal.  . cetirizine (ZYRTEC) 10 MG tablet Take 10 mg by  mouth daily.  . Cholecalciferol (D-3-5) 5000 UNITS capsule Take 5,000 Units by mouth daily.  . Coenzyme Q10 (CO Q-10) 100 MG CAPS Take 1 capsule by mouth daily.   Marland Kitchen EPINEPHrine (EPIPEN 2-PAK) 0.3 mg/0.3 mL IJ SOAJ injection Inject into the muscle once. Reported on 01/20/2015  . fluticasone (FLONASE) 50 MCG/ACT nasal spray Use one to two sprays in each nostril once daily.  . Lansoprazole (PREVACID PO) Take 30 mg by mouth.  . levothyroxine (SYNTHROID, LEVOTHROID) 100 MCG tablet Take 100 mcg by mouth daily before breakfast.  . Multiple Vitamins-Minerals (CENTRUM SILVER ADULT 50+ PO) Take by mouth.  . Olopatadine HCl 0.6 % SOLN Can use 2 sprays in each nostril  1 to 2 times a day as needed for runny nose or congestion.  . Omega-3 Fatty Acids (FISH OIL) 1000 MG CAPS Take by mouth.  . Probiotic Product (SOLUBLE FIBER/PROBIOTICS PO) Take 1 capsule by mouth daily.   . ranitidine (ZANTAC) 300 MG tablet Take 1 tablet (300 mg total) by mouth at bedtime.  . rosuvastatin (CRESTOR) 20 MG tablet Take 20 mg by mouth daily. Reported on 01/20/2015  . sodium chloride (OCEAN) 0.65 % SOLN nasal spray Place 1 spray into both nostrils as needed for congestion.  . SUMAtriptan (IMITREX) 25 MG tablet Take 25 mg by mouth every 2 (two) hours as needed for migraine. Reported on 01/20/2015  . tamoxifen (NOLVADEX) 20 MG tablet TAKE 1 TABLET DAILY (START FEBRUARY 1ST)  . tolterodine (DETROL LA) 4 MG 24 hr capsule Take 4 mg by mouth daily.  . TURMERIC CURCUMIN PO Take 1,000 mg by mouth daily.   Marland Kitchen UNABLE TO FIND Med Name: Allergy shots   No facility-administered encounter medications on file as of 01/21/2016.   : Review of Systems:  Out of a complete 14 point review of systems, all are reviewed and negative with the exception of these symptoms as listed below:  Review of Systems  Neurological: Positive for tremors.       Pt presents today to discuss her tremors. Pt states that she has been singing in the church choir  recently and she noticed that she trembles and cannot hold her music. The fellow choir members also noticed her trembling. Pt does not think this is related to anxiety.   Objective:  Neurologic Exam  Physical Exam Physical Examination:   Vitals:   01/21/16 0924  BP: 118/72  Pulse: 80  Resp: 20   General Examination: The patient is a very pleasant 65 y.o. female in no acute distress. She appears well-developed and well-nourished and well groomed. Mildly anxious appearing.  HEENT: Normocephalic, atraumatic, pupils are equal, round and reactive to light and accommodation. Funduscopic exam is normal with sharp disc margins noted. Extraocular tracking is good without limitation to gaze excursion or nystagmus noted. Normal smooth pursuit is noted. Hearing is grossly intact. Tympanic membranes are clear bilaterally. Face is symmetric with normal facial animation and normal facial sensation. Speech is clear with no dysarthria noted. There is no hypophonia. There is no lip, neck/head, jaw or voice tremor. Neck is supple with full range of passive and active motion. There are no carotid bruits on auscultation. Oropharynx exam reveals: mild mouth dryness, adequate dental hygiene and mild airway crowding, due to Redundant soft palate. Tonsils are absent. Mallampati is class I. Tongue protrudes centrally and palate elevates symmetrically.   Chest: Clear to auscultation without wheezing, rhonchi or crackles noted.  Heart: S1+S2+0, regular and normal without murmurs, rubs or gallops noted.   Abdomen: Soft, non-tender and non-distended with normal bowel sounds appreciated on auscultation.  Extremities: There is no pitting edema in the distal lower extremities bilaterally. Pedal pulses are intact.  Skin: Warm and dry without trophic changes noted. There are no varicose veins.  Musculoskeletal: exam reveals no obvious joint deformities, tenderness or joint swelling or erythema.   Neurologically:  Mental  status: The patient is awake, alert and oriented in all 4 spheres. Her immediate and remote memory, attention, language skills and fund of knowledge are appropriate. There is no evidence of aphasia, agnosia, apraxia or anomia. Speech is clear with normal prosody and enunciation. Thought process is linear. Mood is normal and affect is normal,  mildly anxious.  Cranial nerves II - XII are as described above under HEENT exam. In addition: shoulder shrug is normal with equal shoulder height noted. Motor exam: Normal bulk, strength and tone is noted. There is no drift, rebound.  On 01/21/2016: There is no resting tremor. There is a bilateral upper extremity postural tremor, which is very mild on the right and minimal on the left. She has a minimal degree of action tremor bilaterally. She has no tremor elsewhere. On Archimedes spiral drawing, she has no significant trembling, mild difficulty on the left, handwriting is not tremulous, legible, not micrographic.    Romberg is negative. Reflexes are 2+ throughout. Babinski: Toes are flexor bilaterally. Fine motor skills and coordination: intact with normal finger taps, normal hand movements, normal rapid alternating patting, normal foot taps and normal foot agility.  Cerebellar testing: No dysmetria or intention tremor on finger to nose testing. Heel to shin is unremarkable bilaterally. There is no truncal or gait ataxia.  Sensory exam: intact to light touch, pinprick, vibration, temperature sense in the upper and lower extremities.  Gait, station and balance: She stands easily. No veering to one side is noted. No leaning to one side is noted. Posture is age-appropriate and stance is narrow based. Gait shows normal stride length and normal pace. No problems turning are noted. Tandem walk is unremarkable.   Assessment and Plan:     In summary, Malarie Tappen is a very pleasant 65 y.o.-year old female with an underlying medical history of breast cancer (s/p  right lumpectomy and XRT, no chemotherapy, on tamoxifen), history of hypothyroidism, hyperlipidemia, reflux disease with hiatal hernia, migraines, allergies, plantar fasciitis, spinal stenosis, history of kidney stones, osteoarthritis, depression, trigeminal neuralgia, basal cell cancer and anxiety, who presents for initial consultation of her tremors. She has noticed a tremor in the past few months, she had noticed in the past a mild hand tremor a few years ago. Examination is fairly benign, she does have a mild degree of postural and action tremor, right side worse than left, no evidence of parkinsonism. Her history is not telltale for essential tremor given her large her family and absence of tremor history. Most likely, differential diagnosis is enhanced physiologic tremor, or stress-related tremor, anxiety related tremor, mild essential tremor. She recently had blood work which we reviewed. I do not suggest that we proceed with any other diagnostic tests at this time as her exam is benign, no other focal neurologic findings, no evidence of parkinsonism. I explained this to her. I suggested following her clinically and observing, no new medications were suggested. She is advised to continue with her counseling and working on stress reduction, consideration of marriage counseling if feasible. She is also encouraged to talk to you about potentially utilizing medication for anxiety management. She does appear to be anxious even though she feels that anxiety is not a big player at this time.  I would like to see her back in about 6 months, sooner as needed. I answered all her questions today and she was in agreement.   Thank you very much for allowing me to participate in the care of this nice patient. If I can be of any further assistance to you please do not hesitate to call me at [phone].  Sincerely,   Star Age, MD, PhD

## 2016-01-21 NOTE — Patient Instructions (Addendum)
Your tremor is mild, thankfully, no signs of parkinsonism. We can monitor, no additional test needed from my end of things.   I agree, that counseling may help with stress. Stay active with social activities.  Please look into marriage counseling too.   Please remember, that any kind of tremor may be exacerbated by anxiety, anger, nervousness, excitement, dehydration, sleep deprivation, by caffeine, and low blood sugar values or blood sugar fluctuations. Some medications, especially some antidepressants and lithium can cause or exacerbate tremors. Tremors may temporarily calm down her subside with the use of a benzodiazepine such as Valium or related medications and with alcohol. Be aware however that drinking alcohol is not an approved treatment or appropriate treatment for tremor control and long-term use of benzodiazepines such as Valium, lorazepam, alprazolam, or clonazepam can cause habit formation, physical and psychological addiction.

## 2016-02-01 DIAGNOSIS — S060X9A Concussion with loss of consciousness of unspecified duration, initial encounter: Secondary | ICD-10-CM

## 2016-02-01 DIAGNOSIS — S060XAA Concussion with loss of consciousness status unknown, initial encounter: Secondary | ICD-10-CM

## 2016-02-01 HISTORY — DX: Concussion with loss of consciousness status unknown, initial encounter: S06.0XAA

## 2016-02-01 HISTORY — DX: Concussion with loss of consciousness of unspecified duration, initial encounter: S06.0X9A

## 2016-04-04 ENCOUNTER — Ambulatory Visit (HOSPITAL_BASED_OUTPATIENT_CLINIC_OR_DEPARTMENT_OTHER): Payer: 59 | Admitting: Hematology and Oncology

## 2016-04-04 ENCOUNTER — Encounter: Payer: Self-pay | Admitting: Hematology and Oncology

## 2016-04-04 DIAGNOSIS — C50211 Malignant neoplasm of 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 breast: Secondary | ICD-10-CM | POA: Diagnosis not present

## 2016-04-04 DIAGNOSIS — Z170 Estrogen receptor positive status [ER+]: Secondary | ICD-10-CM | POA: Diagnosis not present

## 2016-04-04 DIAGNOSIS — G250 Essential tremor: Secondary | ICD-10-CM

## 2016-04-04 DIAGNOSIS — N951 Menopausal and female climacteric states: Secondary | ICD-10-CM | POA: Diagnosis not present

## 2016-04-04 MED ORDER — TAMOXIFEN CITRATE 20 MG PO TABS: ORAL_TABLET | ORAL | 3 refills | Status: DC

## 2016-04-04 NOTE — Assessment & Plan Note (Signed)
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 Rt Lumpectomy 12/10/14: Benign Adjuvant radiation: 01/12/2015 to 02/03/2014  Treatment plan: Antiestrogen therapy with tamoxifen 5 years started 03/12/2015  Tamoxifen Toxicities: Occasional hot flashes but not significant to interfere with her life  Patient stays very active with bicycling, swimming, extensive physical exercise regimen as well as to eating nutritious diet, taking supplements. I informed her that she should discuss with her primary care physician regarding what supplements she actually needs and what she does not need to take .  RTC 1 yr for follow-up
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
## 2016-04-06 ENCOUNTER — Other Ambulatory Visit: Payer: Self-pay | Admitting: Emergency Medicine

## 2016-04-06 ENCOUNTER — Telehealth: Payer: Self-pay | Admitting: Emergency Medicine

## 2016-04-06 DIAGNOSIS — Z170 Estrogen receptor positive status [ER+]: Secondary | ICD-10-CM

## 2016-04-06 DIAGNOSIS — C50211 Malignant neoplasm of 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 breast: Secondary | ICD-10-CM

## 2016-04-06 NOTE — Telephone Encounter (Signed)
Patient called requesting referral to [facility]; referral entered and called patient and left her a message advising her that their office should be contacting her soon to set an appointment up with her. Advised her that she can call our office if she doesn't hear from them within the week.

## 2016-04-12 ENCOUNTER — Ambulatory Visit (INDEPENDENT_AMBULATORY_CARE_PROVIDER_SITE_OTHER): Payer: 59 | Admitting: Allergy and Immunology

## 2016-04-12 VITALS — BP 122/78 | HR 88 | Resp 18

## 2016-04-12 DIAGNOSIS — J3089 Other allergic rhinitis: Secondary | ICD-10-CM

## 2016-04-12 DIAGNOSIS — J4520 Mild intermittent asthma, uncomplicated: Secondary | ICD-10-CM

## 2016-04-12 DIAGNOSIS — K219 Gastro-esophageal reflux disease without esophagitis: Secondary | ICD-10-CM | POA: Diagnosis not present

## 2016-04-12 NOTE — Patient Instructions (Addendum)
  1. Continue to treat reflux:   A. Minimize chocolate consumption   B. lansoprazole 30 mg in a.m.  C. attempt to discontinue ranitidine   2. Continue nasal fluticasone 1-2 sprays each nostril 1-7 times per week depending on disease activity  3. Continue the following if needed:   A. Zyrtec 10 mg one tablet once a day  B. Mucinex  C. nasal saline  D. Ventolin HFA 2 puffs every 4-6 hours  4. Return to clinic in 6 months or earlier if problem

## 2016-04-12 NOTE — Progress Notes (Signed)
Follow-up Note  Referring Provider: Leighton Ruff, MD Primary Provider: Gerrit Heck, MD Date of Office Visit: 04/12/2016  Subjective:   Valerie Snyder (DOB: 01/05/1951) is a 66 y.o. female who returns to the [facility] on 04/12/2016 in re-evaluation of the following:  HPI: Valerie Snyder returns to this clinic in reevaluation of her allergic rhinoconjunctivitis and LPR and history of intermittent asthma. I last saw her in this clinic in December 2017 at which point in time she was doing quite well regarding her LPR but still had a history of purulent postnasal drip and she was referred to an ENT doctor regarding this issue.  Overall she is done quite well. Her ENT doctor did provide her with antibiotics for persistent infection. She has resolved her perennial postnasal drip and in fact has no drainage at all. She no longer uses any nasal fluticasone. She has no issues with throat clearing or gurgling in her throat or raspy voice. Overall she feels as though she is just doing wonderful on her current plan which includes therapy directed against reflux. Asthma has not been an issue. She does not use a short acting bronchodilator and can exercise without any difficulty.   She did receive the flu vaccine this year.  Allergies as of 04/12/2016      Reactions   Keflex [cephalexin]    Puffy eyes and lips   Toradol [ketorolac Tromethamine]    Puffy lips and eyes      Medication List      albuterol 108 (90 Base) MCG/ACT inhaler Commonly known as:  VENTOLIN HFA Inhale 2 puffs into the lungs every 4 (four) hours as needed for wheezing or shortness of breath (cough).   CALCIUM 600 1500 (600 Ca) MG Tabs tablet Generic drug:  calcium carbonate Take by mouth 2 (two) times daily with a meal.   CENTRUM SILVER ADULT 50+ PO Take by mouth.   cetirizine 10 MG tablet Commonly known as:  ZYRTEC Take 10 mg by mouth daily.   Co Q-10 100 MG Caps Take 1 capsule by  mouth daily.   D-3-5 5000 units capsule Generic drug:  Cholecalciferol Take 5,000 Units by mouth daily.   EPIPEN 2-PAK 0.3 mg/0.3 mL Soaj injection Generic drug:  EPINEPHrine Inject into the muscle once as needed. Reported on 01/20/2015   Fish Oil 1000 MG Caps Take by mouth.   fluticasone 50 MCG/ACT nasal spray Commonly known as:  FLONASE Use one to two sprays in each nostril once daily.   lansoprazole 30 MG capsule Commonly known as:  PREVACID   levothyroxine 100 MCG tablet Commonly known as:  SYNTHROID, LEVOTHROID Take 100 mcg by mouth daily before breakfast.   Olopatadine HCl 0.6 % Soln Can use 2 sprays in each nostril 1 to 2 times a day as needed for runny nose or congestion.   ranitidine 300 MG tablet Commonly known as:  ZANTAC Take 1 tablet (300 mg total) by mouth at bedtime.   rosuvastatin 20 MG tablet Commonly known as:  CRESTOR Take 20 mg by mouth daily. Reported on 01/20/2015   SOLUBLE FIBER/PROBIOTICS PO Take 1 capsule by mouth daily. 20 billion   SUMAtriptan 25 MG tablet Commonly known as:  IMITREX Take 25 mg by mouth every 2 (two) hours as needed for migraine. Reported on 01/20/2015   tamoxifen 20 MG tablet Commonly known as:  NOLVADEX TAKE 1 TABLET DAILY (START FEBRUARY 1ST)   tolterodine 4 MG 24 hr capsule Commonly known as:  DETROL LA Take 4 mg by mouth daily.   TURMERIC CURCUMIN PO Take 1,000 mg by mouth daily.   UNABLE TO FIND Med Name: Allergy shots       Past Medical History:  Diagnosis Date  . Allergy   . Asthma   . Breast cancer of 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 breast ([place]) 11/13/2014  . GERD (gastroesophageal reflux disease)   . H/O bilateral inguinal hernia repair 1992  . Hypercholesteremia   . Migraines   . Plantar fasciitis, bilateral   . Radiation 01/12/15-02/04/15   right breast 42.17 gray  . Spinal stenosis   . Thyroid disease     Past Surgical History:  Procedure Laterality Date  . BLADDER SUSPENSION    .  BLEPHAROPLASTY Bilateral   . BREAST LUMPECTOMY WITH RADIOACTIVE SEED LOCALIZATION Right 12/10/2014   Procedure: RIGHT BREAST LUMPECTOMY WITH RADIOACTIVE SEED LOCALIZATION;  Surgeon: Autumn Messing III, MD;  Location: [facility];  Service: General;  Laterality: Right;  . FOOT SURGERY     plantar fashiectomies from both feet  . HERNIA REPAIR    . NASAL SEPTUM SURGERY    . neuromas removed from right foot    . 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 removal of basal cell carcinoma (nose)    . TONSILECTOMY/ADENOIDECTOMY WITH MYRINGOTOMY    . TONSILLECTOMY      Review of systems negative except as noted in HPI / PMHx or noted below:  Review of Systems  Constitutional: Negative.   HENT: Negative.   Eyes: Negative.   Respiratory: Negative.   Cardiovascular: Negative.   Gastrointestinal: Negative.   Genitourinary: Negative.   Musculoskeletal: Negative.   Skin: Negative.   Neurological: Negative.   Endo/Heme/Allergies: Negative.   Psychiatric/Behavioral: Negative.      Objective:   Vitals:   04/12/16 1347  BP: 122/78  Pulse: 88  Resp: 18          Physical Exam  Constitutional: She is well-developed, well-nourished, and in no distress.  HENT:  Head: Normocephalic.  Right Ear: Tympanic membrane, external ear and ear canal normal.  Left Ear: Tympanic membrane, external ear and ear canal normal.  Nose: Nose normal. No mucosal edema or rhinorrhea.  Mouth/Throat: Uvula is midline, oropharynx is clear and moist and mucous membranes are normal. No oropharyngeal exudate.  Eyes: Conjunctivae are normal.  Neck: Trachea normal. No tracheal tenderness present. No tracheal deviation present. No thyromegaly present.  Cardiovascular: Normal rate, regular rhythm, S1 normal, S2 normal and normal heart sounds.   No murmur heard. Pulmonary/Chest: Breath sounds normal. No stridor. No respiratory distress. She has no wheezes. She has no rales.  Musculoskeletal: She exhibits  no edema.  Lymphadenopathy:       Head (right side): No tonsillar adenopathy present.       Head (left side): No tonsillar adenopathy present.    She has no cervical adenopathy.  Neurological: She is alert. Gait normal.  Skin: No rash noted. She is not diaphoretic. No erythema. Nails show no clubbing.  Psychiatric: Mood and affect normal.    Diagnostics: none   Assessment and Plan:   1. Other allergic rhinitis   2. Asthma, mild intermittent, well-controlled   3. LPRD (laryngopharyngeal reflux disease)     1. Continue to treat reflux:   A. Minimize chocolate consumption  B. lansoprazole 30 mg in a.m.  C. attempt to discontinue ranitidine   2. Continue nasal fluticasone 1-2 sprays each nostril 1-7 times  per week depending on disease activity  3. Continue the following if needed:   A. Zyrtec 10 mg one tablet once a day  B. Mucinex  C. nasal saline  D. Ventolin HFA 2 puffs every 4-6 hours  4. Return to clinic in 6 months or earlier if problem  We will now begin to taper Danayah's medications by eliminating her ranitidine while she continues to use pantoprazole and some attention to chocolate consumption to treat her LPR. She has the option of restarting her nasal fluticasone should she develop significant problems with her upper airway and of course use a short-acting bronchodilator as needed. If she does well I will see her back in this clinic in 6 months or earlier if there is a problem.  Allena Katz, MD Allergy / Immunology [place]

## 2016-04-13 ENCOUNTER — Encounter: Payer: Self-pay | Admitting: Allergy and Immunology

## 2016-04-14 ENCOUNTER — Ambulatory Visit: Payer: 59 | Attending: Hematology and Oncology | Admitting: Physical Therapy

## 2016-04-14 ENCOUNTER — Encounter: Payer: Self-pay | Admitting: Physical Therapy

## 2016-04-14 DIAGNOSIS — M6281 Muscle weakness (generalized): Secondary | ICD-10-CM

## 2016-04-14 DIAGNOSIS — M62838 Other muscle spasm: Secondary | ICD-10-CM | POA: Insufficient documentation

## 2016-04-14 DIAGNOSIS — R279 Unspecified lack of coordination: Secondary | ICD-10-CM
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
## 2016-04-14 NOTE — Patient Instructions (Addendum)
Moisturizers . They are used in the vagina to hydrate the mucous membrane that make up the vaginal canal. . Designed to keep a more normal acid balance (ph) . Once placed in the vagina, it will last between two to three days.  . Use 2-3 times per week at bedtime and last longer than 60 min. . Ingredients to avoid is glycerin and fragrance, can increase chance of infection . Should not be used just before sex due to causing irritation . Most are gels administered either in a tampon-shaped applicator or as a vaginal suppository. They are non-hormonal.   Types of Moisturizers . Replens- drug store . Samul Dada- drug store . Vitamin E vaginal suppositories- Whole foods . Moist Again . Coconut oil- can break down condoms . [employer] . [place] . V-magic Moisturizer . Yes moisturizer- amazon  Things to avoid in the vaginal area . Do not use things to irritate the vulvar area . No lotions . No soaps; can use Aveeno or Calendula cleanser if needed. Must be gentle . No deodorants . No douches . Good to sleep without underwear to let the vaginal area to air out . No scrubbing: spread the lips to let warm water rinse over labias and pat dry    Lubrication . Used for intercourse to reduce friction . Avoid ones that have glycerin, warming gels, tingling gels, icing or cooling gel, scented . Avoid parabens due to a preservative similar to female sex hormone . May need to be reapplied once or several times during sexual activity . Can be applied to both partners genitals prior to vaginal penetration to minimize friction or irritation . Prevent irritation and mucosal tears that cause post coital pain and increased the risk of vaginal and urinary tract infections . Oil-based lubricants cannot be used with condoms due to breaking them down.  Least likely to irritate vaginal tissue.  . Plant based-lubes are safe . Silicone-based lubrication are thicker and last long and  used for post-menopausal women  Vaginal Lubricators Here is a list of some suggested lubricators you can use for intercourse. Use the most hypoallergenic product.  You can place on you or your partner.   Astroglide natural in green box  K-Y Intrique- silicone base  Good Clean Love -[employer]S,Target, [employer], [employer] (water based)  Slippery Stuff  Sylk, Sliquid Natural H2O ( good  if frequent UTI's)  Blossom Organics (www.blossom-organics.com)  Samul Dada, Coconut oil  PJur Woman Nude- water based lubricant, [place], good for cancer patients with radiation  Yes lubricant- [place] . Wet Platinum-Silicone, Target, [employer]reens Things to avoid in lubricants are glycerin, warming gels, tingling gels, icing or cooling  gels, and scented gels.  Also avoid Vaseline. KY jelly, Replens, and Astroglide kills good bacteria(lactobacilli)  Things to avoid in the vaginal area . Do not use things to irritate the vulvar area . No lotions . No soaps; can use Aveeno or Calendula cleanser if needed. Must be gentle . No deodorants . No douches . Good to sleep without underwear to let the vaginal area to air out . No scrubbing: spread the lips to let warm water rinse over labias and pat dry   [facility] [address], [place], [place] [address] Phone # [phone] Fax [phone]

## 2016-04-26 ENCOUNTER — Ambulatory Visit: Payer: 59 | Admitting: Physical Therapy

## 2016-04-26 ENCOUNTER — Encounter: Payer: Self-pay | Admitting: Physical Therapy

## 2016-04-26 DIAGNOSIS — M6281 Muscle weakness (generalized): Secondary | ICD-10-CM | POA: Diagnosis not present

## 2016-04-26 DIAGNOSIS — M62838 Other muscle spasm: Secondary | ICD-10-CM

## 2016-04-26 DIAGNOSIS — R279 Unspecified lack of coordination: Secondary | ICD-10-CM

## 2016-04-26 NOTE — Therapy (Addendum)
[facility] [address] [place], [place], [address] Phone: [phone]   Fax:  [phone]  Physical Therapy Treatment  Patient Details  Name: Valerie Snyder MRN: [id number] Date of Birth: [birth year] Referring Provider: Dr. Nicholas Lose  Encounter Date: 04/26/2016      PT End of Session - 04/26/16 1702    Visit Number 2   Date for PT Re-Evaluation 06/09/16   PT Start Time 1615   PT Stop Time 1700   PT Time Calculation (min) 45 min   Behavior During Therapy [facility] for tasks assessed/performed      Past Medical History:  Diagnosis Date  . Allergy   . Asthma   . Breast cancer of 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 breast ([place]) 11/13/2014  . GERD (gastroesophageal reflux disease)   . H/O bilateral inguinal hernia repair 1992  . Hypercholesteremia   . Migraines   . Plantar fasciitis, bilateral   . Radiation 01/12/15-02/04/15   right breast 42.17 Valerie Snyder  . Spinal stenosis   . Thyroid disease     Past Surgical History:  Procedure Laterality Date  . BLADDER SUSPENSION    . BLEPHAROPLASTY Bilateral   . BREAST LUMPECTOMY WITH RADIOACTIVE SEED LOCALIZATION Right 12/10/2014   Procedure: RIGHT BREAST LUMPECTOMY WITH RADIOACTIVE SEED LOCALIZATION;  Surgeon: Autumn Messing III, MD;  Location: [facility];  Service: General;  Laterality: Right;  . FOOT SURGERY     plantar fashiectomies from both feet  . HERNIA REPAIR    . NASAL SEPTUM SURGERY    . neuromas removed from right foot    . 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 removal of basal cell carcinoma (nose)    . TONSILECTOMY/ADENOIDECTOMY WITH MYRINGOTOMY    . TONSILLECTOMY      There were no vitals filed for this visit.      Subjective Assessment - 04/26/16 1617    Subjective I found the prickly ball roller. I have been off the statins due to the terrible leg cramps. The cramps are better.  I had quite a bit of discomfort after internal  portion of the evaluation and went away after 24 hours.    Patient Stated Goals learn exercises to strengthen pelvic floor muscles   Currently in Pain? Yes   Pain Score 1    Pain Location Pelvis   Pain Orientation Mid   Pain Descriptors / Indicators Spasm   Pain Type Acute pain   Pain Onset Other (comment)   Pain Frequency Intermittent   Aggravating Factors  sitting   Pain Relieving Factors no intercourse   Multiple Pain Sites No                                 PT Education - 04/26/16 1700    Education provided Yes   Education Details stretches for hips, lubricants  vaginal moistrizers for vaginal area, guided pain you tube video,    Person(s) Educated Patient   Methods Explanation;Demonstration;Verbal cues;Handout   Comprehension Returned demonstration;Verbalized understanding          PT Short Term Goals - 04/26/16 1703      PT SHORT TERM GOAL #1   Title independent with initial HEP for flexibility exercise and pelvic floor meditation   Time 4   Period Weeks   Status On-going     PT SHORT TERM GOAL #2   Title understand  how to perform perineal massage to relax the muscles and open up the area for penile penetration and/or dilator   Time 4   Period Weeks   Status On-going     PT SHORT TERM GOAL #3   Title burning pain with urination decreased >/= 25% due to increased moisture of the perineal area   Time 4   Period Weeks   Status On-going     PT SHORT TERM GOAL #4   Title understand how to use vaginal lubricants and moisturizers to reduce vaginal dryness and increase health of the tissue   Time 4   Period Weeks   Status Achieved           PT Long Term Goals - 04/14/16 1557      PT LONG TERM GOAL #1   Title Independent with HEP   Time 8   Period Weeks   Status New     PT LONG TERM GOAL #2   Title ability to have penile penetration with pain decreased >/= 75% due to improved moisture and extensibility of the perineal tissue    Time 8   Period Weeks   Status New     PT LONG TERM GOAL #3   Title urinary leakage decreased >/= 75% due to improve pelvic floor contraction and strength >/= 4/5   Time 8   Period Weeks   Status New     PT LONG TERM GOAL #4   Title burning pain with urination decreased >/= 75% due to improved health of the vaginal tissue   Time 8   Period Weeks   Status New     PT LONG TERM GOAL #5   Title FOTO score </= 46% limitation   Time 8   Period Weeks   Status New     Plan: Patient has not met goals due to just starting therapy.  Patient learned how to perform perineal massage. Patient has learned how to stretch hips to relax the pelvic floor. Patient has been using a prickly roller to reduce muscle cramps.  Pateint will benefit from skilled therapy to reduce pelvic pain and urinary leakage.   Patient will benefit from skilled therapeutic intervention in order to improve on the following deficit: Pain; decreased mobility; decreased strength; decreased activity tolerance; decreased endurance; increased fascial restriction; increased muscle spasms. Rehab potential; Excellent Clinical impairments affecting rehab potential; bladder suspension with TVT sling, repair of the vaginal hernia; breast cancer with radiation 01/12/2015-02/04/2015; 03/12/2015 started tamoxifen PT frequency: 1x/week PT duration: 12 weeks PT treatment/intervention: Biofeedback; Cryotherapy; Moist heat; Therapeutic activities; therapeutic exercise; Patient/family education; manual techniques; instruction on use of dilator. PT next visit plan: review HEP, instruction on dilator, explain pain, soft tissue work. PT Home Exercise Plan: progress as needed Consulted and Agree with Plan of Care: Patient         Patient will benefit from skilled therapeutic intervention in order to improve the following deficits and impairments:     Visit Diagnosis: Muscle weakness (generalized)  Unspecified lack of coordination  Other  muscle spasm     Problem List Patient Active Problem List   Diagnosis Date Noted  . Genetic testing 12/10/2014  . Breast cancer of 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 breast ([place]) 11/13/2014  . Allergic rhinoconjunctivitis 10/22/2014  . Asthma 10/22/2014    Earlie Counts, PT 04/26/16 5:06 PM   [facility] [address] [place], [place], [address] Phone: [phone]   Fax:  [phone]  Name: Valerie Snyder MRN: [id number] Date of Birth: 10-15-1950

## 2016-04-26 NOTE — Patient Instructions (Addendum)
Moisturizers . They are used in the vagina to hydrate the mucous membrane that make up the vaginal canal. . Designed to keep a more normal acid balance (ph) . Once placed in the vagina, it will last between two to three days.  . Use 2-3 times per week at bedtime and last longer than 60 min. . Ingredients to avoid is glycerin and fragrance, can increase chance of infection . Should not be used just before sex due to causing irritation . Most are gels administered either in a tampon-shaped applicator or as a vaginal suppository. They are non-hormonal.   Types of Moisturizers . Samul Dada- drug store  [place] or their website . Vitamin E vaginal suppositories- Whole foods . Moist Again . Coconut oil- can break down condoms . [employer] . [place], has lidocaine to decrease pain . V-magic Moisturizer ( cream to use on the vulva, like daily lotion) . Yes moisturizer- amazon  Things to avoid in the vaginal area . Do not use things to irritate the vulvar area . No lotions . No soaps; can use Aveeno or Calendula cleanser if needed. Must be gentle . No deodorants . No douches . Good to sleep without underwear to let the vaginal area to air out . No scrubbing: spread the lips to let warm water rinse over labias and pat dry   Lubrication . Used for intercourse to reduce friction . Avoid ones that have glycerin, warming gels, tingling gels, icing or cooling gel, scented . Avoid parabens due to a preservative similar to female sex hormone . May need to be reapplied once or several times during sexual activity . Can be applied to both partners genitals prior to vaginal penetration to minimize friction or irritation . Prevent irritation and mucosal tears that cause post coital pain and increased the risk of vaginal and urinary tract infections . Oil-based lubricants cannot be used with condoms due to breaking them down.  Least likely to  irritate vaginal tissue.  . Plant based-lubes are safe . Silicone-based lubrication are thicker and last long and used for post-menopausal women  Vaginal Lubricators Here is a list of some suggested lubricators you can use for intercourse. Use the most hypoallergenic product.  You can place on you or your partner.   Good Clean Love -CVS,Target, Walmart, [employer] (water based)  Slippery Stuff  Sylk, Sliquid Natural H2O ( good  if frequent UTI's)  Blossom Organics (www.blossom-organics.com)  Samul Dada, Coconut oil  PJur Woman Nude- water based lubricant, [place], good for cancer patients with radiation  Yes lubricant- [place] . Wet Platinum-Silicone, Target, Walgreens  Things to avoid in lubricants are glycerin, warming gels, tingling gels, icing or cooling  gels, and scented gels.  Also avoid Vaseline. KY jelly, Replens, and Astroglide kills good bacteria(lactobacilli)  Things to avoid in the vaginal area . Do not use things to irritate the vulvar area . No lotions . No soaps; can use Aveeno or Calendula cleanser if needed. Must be gentle . No deodorants . No douches . Good to sleep without underwear to let the vaginal area to air out . No scrubbing: spread the lips to let warm water rinse over labias and pat dry    Guided Meditation for Pelvic Floor Relaxation  FemFusion Fitness   FemFusion Fitness    TEDx[employer] - Onnie Graham - Why Things Hurt Butterfly, Supine    Lie on back, feet together.  Lower knees toward floor and place pillows under knees. Hold _60__ seconds. Repeat _1-2__ times per session. Do _1-2__ sessions per day. Do your belly breathing.  Copyright  VHI. All rights reserved.    Supine Knee-to-Chest, Unilateral    Lie on back, hands clasped behind one knee. Pull knee in toward chest until a comfortable stretch is felt in lower back and buttocks. Hold _60__ seconds.  Repeat _2__ times per  session. Do _1__ sessions per day. Each leg. Copyright  VHI. All rights reserved.   Supine Knee-to-Chest, Bilateral    Lie on back, hands clasped behind both knees. Pull knees in toward chest until a comfortable stretch is felt in lower back and buttocks. Hold _60__ seconds. Repeat _2__ times per session. Do _1__ sessions per day. Rock back and forth the last 15 seconds. Copyright  VHI. All rights reserved.   Quads / HF, Supine    Lie near edge of bed, one leg bent, foot flat on bed. Other leg hanging over edge, relaxed, thigh resting entirely on bed. Bend hanging knee backward keeping thigh in contact with bed. Hold ___ seconds.  Repeat _1__ times per session. Do __1_ sessions per day. Use a strap to pull Copyright  VHI. All rights reserved.    The Original Stretch Out Strap with Exercise Book by OPTP - Top Choice of Physical Therapists & Athletic Trainers or a yoga strap.   Hip Adductor: Wall Stretch    Lie on back with hips against wall, back of thighs on wall. Pull legs apart until stretch is felt in inner thighs. Hold _30__ seconds. Relax. Repeat __2_ times. Do _1__ times a day. Advanced: At end of stretch, rotate thighs outward.  Copyright  VHI. All rights reserved.  Posterior Hip: Wall Slide    Lie on floor with back of legs on wall. Put right ankle on other thigh. Slide opposite foot down wall until stretch is felt in back of hip. Hold _30__ seconds. Relax. Repeat _2__ times. Do _1__ times a day. Repeat on other leg.    Copyright  VHI. All rights reserved.  [facility] [address] [place], [place] [address] Phone # [phone] Fax [phone]

## 2016-04-27 ENCOUNTER — Encounter: Payer: 59 | Admitting: Physical Therapy

## 2016-05-02 ENCOUNTER — Telehealth: Payer: Self-pay | Admitting: Allergy and Immunology

## 2016-05-02 NOTE — Telephone Encounter (Signed)
Please inform patient that it may be best to use medications preventatively starting a few weeks prior to visit. Can use nasal steroid consistently and can use a inhaled steroid such as Asmanex 220 one inhalation one time per day. Please provide sample and script.

## 2016-05-02 NOTE — Telephone Encounter (Signed)
Informed patient of Dr. Neldon Mc instructions of how to use medications prior to her visit and the duration of her visit. Patient understood plan.

## 2016-05-02 NOTE — Telephone Encounter (Signed)
Patient is going out of town and staying with a family member This family member recently got a cat Patient has a cat allergy Patient wants to know what she can do or take in addition to current meds for this cat allergy She will be there a week

## 2016-05-12 ENCOUNTER — Ambulatory Visit: Payer: 59 | Attending: Hematology and Oncology | Admitting: Physical Therapy

## 2016-05-12 ENCOUNTER — Encounter: Payer: Self-pay | Admitting: Physical Therapy

## 2016-05-12 DIAGNOSIS — M6281 Muscle weakness (generalized): Secondary | ICD-10-CM | POA: Insufficient documentation

## 2016-05-12 DIAGNOSIS — M62838 Other muscle spasm: Secondary | ICD-10-CM | POA: Diagnosis present

## 2016-05-12 DIAGNOSIS — R279 Unspecified lack of coordination: Secondary | ICD-10-CM

## 2016-05-12 NOTE — Patient Instructions (Addendum)
Explain pain in 5 minutes by you tube and it is a drawing form  Amazon look for Vibio Active Kegel Exerciser It is $29.95  PROTOCOL FOR DILATORS   1. Wash dilator with soap and water prior to insertion.    2. Lay on your back reclined. Knees are to be up and apart while on your bed or in the bathtub with warm water.   3. Lubricate the end of the dilator with a water-soluble lubricant.  4. Separate the labia.   5. Tense the pelvic floor muscles than relax; while relaxing, slide lubricated dilator into the vagina.    6. Tense muscles again while holding the dilator so it does not get pushed out; relax and slide it in a little further.   7. Try blowing out as if filling a balloon; this may relax the muscles and allow penetration.  Repeat blowing out to insert dilator further. NO more than 3/10 pain.    8. Keep dilator in for 10 minutes if tolerate, with the pelvic floor muscles relaxed to further stretch the canal.   9. Never force the dilator into the canal.  10. 1-2 times per day  To progress there needs to be no more than 3/10 pain and ability to move dilator in different directions.   [facility] [address] [place], [place] [address] Phone # [phone] Fax [phone]
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
## 2016-05-23 ENCOUNTER — Encounter: Payer: 59 | Attending: Family Medicine | Admitting: Registered"

## 2016-05-23 ENCOUNTER — Encounter: Payer: Self-pay | Admitting: Registered"

## 2016-05-23 DIAGNOSIS — Z6823 Body mass index (BMI) 23.0-23.9, adult: Secondary | ICD-10-CM | POA: Diagnosis not present

## 2016-05-23 DIAGNOSIS — E7800 Pure hypercholesterolemia, unspecified: Secondary | ICD-10-CM | POA: Insufficient documentation

## 2016-05-23 DIAGNOSIS — E7801 Familial hypercholesterolemia: Secondary | ICD-10-CM

## 2016-05-23 DIAGNOSIS — Z713 Dietary counseling and surveillance: Secondary | ICD-10-CM | POA: Diagnosis not present

## 2016-05-23 NOTE — Patient Instructions (Addendum)
Plan: Continue using Benecol Continue having oatmeal on regular basis Omega 3 sources fish 2-3 week, chia seeds, flax seeds, walnuts Include more nuts and peanut butter, and pistachios Pomegranate juice (1/2 cup per day)

## 2016-05-23 NOTE — Progress Notes (Signed)
Medical Nutrition Therapy:  Appt start time: 1600 end time:  1700.   Assessment:  Primary concerns today: Pt states she has high cholesterol, was taken off statin 5 weeks ago due to side effects, and now her cholesterol values have increased even though she has a healthy diet and regular exercise.   Pt states she has been reading up on ways to decrease cholesterol and started taking psyllium to increase fiber as well as started using Benecol margarine.  Pt states she is clear of breast cancer s/p chemotherapy 2016. Pt states that in Sept 2017 she received [facility] [employer] and started working out 1-2 hrs day 5-6 days week with trainer. Pt states she feels emotionally and physically better since she started this exercise routine.  Labs 05/07/2016 (not taking statin)- Chol 225, TG 110, HDL 73, LDL 130; 3.1 ratio  Preferred Learning Style:   No preference indicated   Learning Readiness:   Change in progress  MEDICATIONS: reviewed   DIETARY INTAKE:  Usual eating pattern includes 3 meals and 0-1 snacks per day.  Everyday foods include oatmeal, grape juice, square chocolate w marshmallow.     24-hr recall:  B ( AM): fruit, greek yogurt w stevia, tsp raspberry preserve OR poached egg oatmeal OR whole grain bread Snk ( AM): none  L ( PM): green salad, chicken OR humus, pita bread OR chick pea salad Snk ( PM): none D ( PM): tilapia, salmon, tuna OR ground [place] for salads or tacos OR pork chops sweet potatoes Snk ( PM): none OR fruit and popcorn OR 2% cottage cheese w fruit Beverages: selser water, almond milk, occasionally tea  Usual physical activity: Work out with trainer 5-6x/week, 1-2 hrs each time  Estimated energy needs: 1800 calories 200 g carbohydrates 135 g protein 50 g fat  Progress Towards Goal(s):  In progress.   Nutritional Diagnosis:  NB-1.1 Food and nutrition-related knowledge deficit As related to foods sources to lower cholesterol.  As  evidenced by diet recall and patient reported nutrition education provided new information..    Intervention:  Nutrition Education. Food sources which help lower cholesterol and possible mechanisms for this actions. Importance of balanced diet and including whole grains and other carbohydrates for energy source.  Plan: Continue using Benecol Continue having oatmeal on regular basis Omega 3 sources fish 2-3 week, chia seeds, flax seeds, walnuts Include more nuts and peanut butter, and pistachios Pomegranate juice (1/2 cup per day)  Teaching Method Utilized:  Visual Auditory  Handouts given during visit include:  Cholesterol & Triglycerides  Types of Fats  Barriers to learning/adherence to lifestyle change: none  Demonstrated degree of understanding via:  Teach Back   Monitoring/Evaluation:  Dietary intake, exercise, and body weight prn.

## 2016-05-26 ENCOUNTER — Encounter: Payer: Self-pay | Admitting: Physical Therapy

## 2016-05-26 ENCOUNTER — Ambulatory Visit: Payer: 59 | Admitting: Physical Therapy

## 2016-05-26 DIAGNOSIS — M6281 Muscle weakness (generalized): Secondary | ICD-10-CM | POA: Diagnosis not present

## 2016-05-26 DIAGNOSIS — R279 Unspecified lack of coordination: Secondary | ICD-10-CM

## 2016-05-26 DIAGNOSIS — M62838 Other muscle spasm: Secondary | ICD-10-CM

## 2016-05-26 NOTE — Therapy (Signed)
[facility] [address] [place], [place], [address] Phone: [phone]   Fax:  [phone]  Physical Therapy Treatment  Patient Details  Name: Valerie Snyder MRN: [id number] Date of Birth: 31-May-1950 Referring Provider: Dr. Nicholas Lose  Encounter Date: 05/26/2016      PT End of Session - 05/26/16 1439    Visit Number 4   Date for PT Re-Evaluation 06/09/16   PT Start Time 1400   PT Stop Time 1443   PT Time Calculation (min) 43 min   Activity Tolerance Patient tolerated treatment well   Behavior During Therapy [facility] for tasks assessed/performed      Past Medical History:  Diagnosis Date  . Allergy   . Asthma   . Breast cancer of 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 breast ([place]) 11/13/2014  . GERD (gastroesophageal reflux disease)   . H/O bilateral inguinal hernia repair 1992  . Hypercholesteremia   . Migraines   . Plantar fasciitis, bilateral   . Radiation 01/12/15-02/04/15   right breast 42.17 gray  . Spinal stenosis   . Thyroid disease     Past Surgical History:  Procedure Laterality Date  . BLADDER SUSPENSION    . BLEPHAROPLASTY Bilateral   . BREAST LUMPECTOMY WITH RADIOACTIVE SEED LOCALIZATION Right 12/10/2014   Procedure: RIGHT BREAST LUMPECTOMY WITH RADIOACTIVE SEED LOCALIZATION;  Surgeon: Autumn Messing III, MD;  Location: [facility] [place];  Service: General;  Laterality: Right;  . FOOT SURGERY     plantar fashiectomies from both feet  . HERNIA REPAIR    . NASAL SEPTUM SURGERY    . neuromas removed from right foot    . 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 removal of basal cell carcinoma (nose)    . TONSILECTOMY/ADENOIDECTOMY WITH MYRINGOTOMY    . TONSILLECTOMY      There were no vitals filed for this visit.      Subjective Assessment - 05/26/16 1402    Subjective I am using the dilator and it is going well. I am on the last dilator with pain level 1-2/10 and I am unable  to go all the way and felt like I have hit the rectum. I have been doing my exercises.    Patient Stated Goals learn exercises to strengthen pelvic floor muscles   Currently in Pain? No/denies                      Pelvic Floor Special Questions - 05/26/16 0001    Urinary Leakage Yes   Activities that cause leaking Bending  now a dribble instead of spurt   Pelvic Floor Internal Exam Patient confirms identification and approves PT to assess muscle intergity   Exam Type Vaginal   Palpation tenderness located on left puborectalis   Strength fair squeeze, definite lift   Strength # of reps 5   Strength # of seconds 10                   PT Education - 05/26/16 1432    Education provided Yes   Education Details pelvic floor contractions in sitting   Person(s) Educated Patient   Methods Explanation;Demonstration;Verbal cues;Handout   Comprehension Returned demonstration;Verbalized understanding          PT Short Term Goals - 05/12/16 0949      PT SHORT TERM GOAL #1   Title independent with initial HEP for flexibility exercise and pelvic floor meditation  Time 4   Period Weeks   Status Achieved     PT SHORT TERM GOAL #2   Title understand how to perform perineal massage to relax the muscles and open up the area for penile penetration and/or dilator   Time 4   Period Weeks   Status Achieved     PT SHORT TERM GOAL #3   Title burning pain with urination decreased >/= 25% due to increased moisture of the perineal area   Time 4   Period Weeks   Status Achieved           PT Long Term Goals - 05/26/16 1406      PT LONG TERM GOAL #1   Title Independent with HEP   Time 8   Period Weeks   Status On-going  still learning     PT LONG TERM GOAL #2   Title ability to have penile penetration with pain decreased >/= 75% due to improved moisture and extensibility of the perineal tissue   Time 8   Period Weeks   Status On-going  on largest dilator      PT LONG TERM GOAL #3   Title urinary leakage decreased >/= 75% due to improve pelvic floor contraction and strength >/= 4/5   Time 8   Period Weeks   Status On-going  50% better     PT LONG TERM GOAL #4   Title burning pain with urination decreased >/= 75% due to improved health of the vaginal tissue   Time 8   Period Weeks   Status Achieved     PT LONG TERM GOAL #5   Title FOTO score </= 46% limitation   Time 8   Period Weeks   Status New               Plan - 05/26/16 1439    Clinical Impression Statement Patient urinary leakage has improved to a very small amount.  Patient stopped the Crestor and now is not having muscle cramps.  Patient is on the lasat dilator with pain no more than 3/10.  Patient is having urinary leakage with bending over.  Patient has not had intercourse yet.  Patient will benefit from skilled therapy to reduce pelvic pain and urinary leakage.    Rehab Potential Excellent   Clinical Impairments Affecting Rehab Potential Bladder suspension with TVT sling , Repair of the vaginal hernia; Breast cancer with radiation 01/12/2015-02/04/2015; 03/12/2015 started tamoxifen   PT Frequency 1x / week   PT Duration 12 weeks   PT Treatment/Interventions Biofeedback;Cryotherapy;Moist Heat;Therapeutic activities;Therapeutic exercise;Patient/family education;Manual techniques   PT Next Visit Plan reassess for possible discharge or if continue progress pelvic floor contraction with bending forward   PT Home Exercise Plan progress as needed   Consulted and Agree with Plan of Care Patient      Patient will benefit from skilled therapeutic intervention in order to improve the following deficits and impairments:  Pain, Decreased mobility, Decreased strength, Decreased activity tolerance, Decreased endurance, Increased fascial restricitons, Increased muscle spasms  Visit Diagnosis: Muscle weakness (generalized)  Unspecified lack of coordination  Other muscle  spasm     Problem List Patient Active Problem List   Diagnosis Date Noted  . Genetic testing 12/10/2014  . Breast cancer of 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 breast ([place]) 11/13/2014  . Allergic rhinoconjunctivitis 10/22/2014  . Asthma 10/22/2014    Earlie Counts, PT 05/26/16 2:45 PM   [facility] [address],  [place], [place], [address] Phone: [phone]   Fax:  [phone]  Name: Arieal Cuoco MRN: [id number] Date of Birth: 29-Oct-1950

## 2016-05-26 NOTE — Patient Instructions (Addendum)
Quick Contraction: Gravity Resisted (Sitting)    Sitting, quickly squeeze then fully relax pelvic floor. Perform _1__ sets of 10___. Rest for _1__ seconds between sets. Do _3__ times a day.  Copyright  VHI. All rights reserved.  Slow Contraction: Gravity Resisted (Sitting)    Sitting, slowly squeeze pelvic floor for _5__ seconds. Rest for _5__ seconds. Repeat _10__ times. Do __3_ times a day. Do not hold your breath. Copyright  VHI. All rights reserved.  Bending Down: Phase 1   Feel the vagina come off your underwear and tightening of the lower abdominal.  Stand in front of low surface. Squeeze pelvic floor and hold. Lean forward and touch surface. Relax. Repeat _5__ times. Do _2__ times a day.  Copyright  VHI. All rights reserved.  [facility] [address] [place], [place] [address] Phone # [phone] Fax [phone]

## 2016-06-07 ENCOUNTER — Encounter: Payer: Self-pay | Admitting: Physical Therapy

## 2016-06-07 ENCOUNTER — Ambulatory Visit: Payer: 59 | Attending: Hematology and Oncology | Admitting: Physical Therapy

## 2016-06-07 DIAGNOSIS — M62838 Other muscle spasm: Secondary | ICD-10-CM | POA: Insufficient documentation

## 2016-06-07 DIAGNOSIS — R279 Unspecified lack of coordination: Secondary | ICD-10-CM

## 2016-06-07 DIAGNOSIS — M6281 Muscle weakness (generalized): Secondary | ICD-10-CM

## 2016-06-07 NOTE — Therapy (Signed)
[facility] [address] [place], [place], [address] Phone: [phone]   Fax:  [phone]  Physical Therapy Treatment  Patient Details  Name: Valerie Snyder MRN: [id number] Date of Birth: 02-18-1950 Referring Provider: Dr. Nicholas Lose  Encounter Date: 06/07/2016      PT End of Session - 06/07/16 1516    Visit Number 5   Date for PT Re-Evaluation 06/09/16   PT Start Time 1445   PT Stop Time 1530   PT Time Calculation (min) 45 min   Activity Tolerance Patient tolerated treatment well   Behavior During Therapy [facility] for tasks assessed/performed      Past Medical History:  Diagnosis Date  . Allergy   . Asthma   . Breast cancer of 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 breast ([place]) 11/13/2014  . GERD (gastroesophageal reflux disease)   . H/O bilateral inguinal hernia repair 1992  . Hypercholesteremia   . Migraines   . Plantar fasciitis, bilateral   . Radiation 01/12/15-02/04/15   right breast 42.17 gray  . Spinal stenosis   . Thyroid disease     Past Surgical History:  Procedure Laterality Date  . BLADDER SUSPENSION    . BLEPHAROPLASTY Bilateral   . BREAST LUMPECTOMY WITH RADIOACTIVE SEED LOCALIZATION Right 12/10/2014   Procedure: RIGHT BREAST LUMPECTOMY WITH RADIOACTIVE SEED LOCALIZATION;  Surgeon: Autumn Messing III, MD;  Location: [facility];  Service: General;  Laterality: Right;  . FOOT SURGERY     plantar fashiectomies from both feet  . HERNIA REPAIR    . NASAL SEPTUM SURGERY    . neuromas removed from right foot    . 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 removal of basal cell carcinoma (nose)    . TONSILECTOMY/ADENOIDECTOMY WITH MYRINGOTOMY    . TONSILLECTOMY      There were no vitals filed for this visit.      Subjective Assessment - 06/07/16 1448    Subjective I have been doing my exercises. Urgency has decreased by 80%. I am on the largest dilator.     Patient Stated  Goals learn exercises to strengthen pelvic floor muscles   Currently in Pain? 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 Nicholas Lose   Onset Date/Surgical Date 11/11/14   Prior Therapy None     Precautions   Precautions Other (comment)   Precaution Comments estrogen positive breast cancer     Restrictions   Weight Bearing Restrictions No     Balance Screen   Has the patient fallen in the past 6 months No   Has the patient had a decrease in activity level because of a fear of falling?  No   Is the patient reluctant to leave their home because of a fear of falling?  No     Home Ecologist residence     Prior Function   Level of [place] Retired   Leisure exercise     Cognition   Overall Cognitive Status Within Functional Limits for tasks assessed     Observation/Other Assessments   Focus on Therapeutic Outcomes (FOTO)  Urinary problem is 14% limitation  goal is 46% limitation  Posture/Postural Control   Posture/Postural Control No significant limitations     AROM   Overall AROM Comments lumbar ROM is full     Palpation   SI assessment  sacrum rotated left     Transfers   Transfers Not assessed     Ambulation/Gait   Ambulation/Gait No                  Pelvic Floor Special Questions - 06/07/16 0001    Currently Sexually Active No   History of sexually transmitted disease --  husband not ready yet   Urinary Leakage No   Urinary urgency No   Fluid intake drinks alot of water   Strength good squeeze, good lift, able to hold agaisnt strong resistance           [facility] Adult PT Treatment/Exercise - 06/07/16 0001      Manual Therapy   Manual Therapy Joint mobilization;Soft tissue mobilization   Joint  Mobilization rotational mobiliation for L3-L5; rotational mobization to sacrum to rotate right   Soft tissue mobilization soft tissue work to left quadratus and lumbar sacral sulus.                PT Education - 06/07/16 1624    Education provided Yes   Education Details reviewed past HEP and patient has no difficulty with her HEP and is very consistent.    Person(s) Educated Patient   Methods Explanation   Comprehension Verbalized understanding;Returned demonstration          PT Short Term Goals - 05/12/16 0949      PT SHORT TERM GOAL #1   Title independent with initial HEP for flexibility exercise and pelvic floor meditation   Time 4   Period Weeks   Status Achieved     PT SHORT TERM GOAL #2   Title understand how to perform perineal massage to relax the muscles and open up the area for penile penetration and/or dilator   Time 4   Period Weeks   Status Achieved     PT SHORT TERM GOAL #3   Title burning pain with urination decreased >/= 25% due to increased moisture of the perineal area   Time 4   Period Weeks   Status Achieved           PT Long Term Goals - 06/07/16 1453      PT LONG TERM GOAL #1   Title Independent with HEP   Time 8   Period Weeks   Status Achieved     PT LONG TERM GOAL #2   Title ability to have penile penetration with pain decreased >/= 75% due to improved moisture and extensibility of the perineal tissue   Time 8   Period Weeks   Status Unable to assess  husband not ready for intercourse     PT LONG TERM GOAL #3   Title urinary leakage decreased >/= 75% due to improve pelvic floor contraction and strength >/= 4/5   Time 8   Period Weeks   Status Achieved     PT LONG TERM GOAL #4   Title burning pain with urination decreased >/= 75% due to improved health of the vaginal tissue   Time 8   Period Weeks   Status Achieved     PT LONG TERM GOAL #5   Title FOTO score </= 46% limitation   Time 8   Period Weeks   Status  Achieved  Plan - 06-22-2016 1517    Clinical Impression Statement Patient has met goals.  Patient has not had intercourse because her husband is not ready yet.  Patient has not leaked urine for 2 weeks.  Patient has reduced urge to urinate by 80%. Patient is not using the largest dilator.  Patient has no urinary leakage with bending over. Patient is ready for discharge.    Rehab Potential Excellent   Clinical Impairments Affecting Rehab Potential Bladder suspension with TVT sling , Repair of the vaginal hernia; Breast cancer with radiation 01/12/2015-02/04/2015; 03/12/2015 started tamoxifen   PT Frequency 1x / week   PT Duration 12 weeks   PT Treatment/Interventions Biofeedback;Cryotherapy;Moist Heat;Therapeutic activities;Therapeutic exercise;Patient/family education;Manual techniques   PT Next Visit Plan Discharge to HEP   PT Home Exercise Plan Current HEP   Recommended Other Services None   Consulted and Agree with Plan of Care Patient      Patient will benefit from skilled therapeutic intervention in order to improve the following deficits and impairments:  Pain, Decreased mobility, Decreased strength, Decreased activity tolerance, Decreased endurance, Increased fascial restricitons, Increased muscle spasms  Visit Diagnosis: Muscle weakness (generalized)  Unspecified lack of coordination  Other muscle spasm       G-Codes - 06-22-2016 1623    Functional Assessment Tool Used (Outpatient Only) FOTO score for urinary problem is 14% limitation   Functional Limitation Other PT primary   Other PT Primary Goal Status (U7253) At least 40 percent but less than 60 percent impaired, limited or restricted   Other PT Primary Discharge Status (G6440) At least 1 percent but less than 20 percent impaired, limited or restricted      Problem List Patient Active Problem List   Diagnosis Date Noted  . Genetic testing 12/10/2014  . Breast cancer of upper-inner quadrant of right  female breast ([place]) 11/13/2014  . Allergic rhinoconjunctivitis 10/22/2014  . Asthma 10/22/2014    Earlie Counts, PT 2016/06/22 4:27 PM    [facility] [address], [place], [place], [address] Phone: [phone]   Fax:  [phone]  Name: Orlean Holtrop MRN: [id number] Date of Birth: March 04, 1950  PHYSICAL THERAPY DISCHARGE SUMMARY  Visits from Start of Care: 5  Current functional level related to goals / functional outcomes: See above.   Remaining deficits: See above.    Education / Equipment: HEP Plan: Patient agrees to discharge.  Patient goals were met. Patient is being discharged due to meeting the stated rehab goals. Thank you for the referral. Earlie Counts, PT 06-22-16 4:27 PM   ?????

## 2016-06-09 ENCOUNTER — Encounter: Payer: 59 | Admitting: Physical Therapy

## 2016-07-07 ENCOUNTER — Ambulatory Visit: Payer: 59 | Admitting: Allergy

## 2016-07-13 ENCOUNTER — Telehealth: Payer: Self-pay | Admitting: Allergy and Immunology

## 2016-07-13 MED ORDER — EPINEPHRINE 0.3 MG/0.3ML IJ SOAJ: 0.3000 mg | Freq: Once | INTRAMUSCULAR | 1 refills | Status: DC | PRN

## 2016-07-13 MED ORDER — MOMETASONE FUROATE 200 MCG/ACT IN AERO: 1.0000 | INHALATION_SPRAY | Freq: Every day | RESPIRATORY_TRACT | 1 refills | Status: DC

## 2016-07-13 NOTE — Telephone Encounter (Signed)
Pt called and said that she was giving a list of thing to do about been around cats and she needs to find out the list because she lost it. [phone]

## 2016-07-13 NOTE — Telephone Encounter (Signed)
Spoke to patient and gave her instruction that Dr. Neldon Mc gave in the telephone contact in 05/2016 on what to do with her allergy. I sent in her epi-pen and her inhaler to her mail order. Patient understood.

## 2016-07-21 ENCOUNTER — Ambulatory Visit (INDEPENDENT_AMBULATORY_CARE_PROVIDER_SITE_OTHER): Payer: 59 | Admitting: Neurology

## 2016-07-21 ENCOUNTER — Encounter (INDEPENDENT_AMBULATORY_CARE_PROVIDER_SITE_OTHER): Payer: Self-pay

## 2016-07-21 ENCOUNTER — Encounter: Payer: Self-pay | Admitting: Neurology

## 2016-07-21 VITALS — BP 121/65 | HR 81 | Ht 65.0 in | Wt 140.0 lb

## 2016-07-21 DIAGNOSIS — R251 Tremor, unspecified: Secondary | ICD-10-CM

## 2016-07-21 NOTE — Progress Notes (Signed)
Subjective:    Patient ID: Valerie Snyder is a 66 y.o. female.  HPI     Interim history:   Ms. Ballew is a 66 year old right-handed woman with an underlying medical history of breast cancer (s/p right lumpectomy and XRT, no chemotherapy, on tamoxifen), history of hypothyroidism, hyperlipidemia, reflux disease with hiatal hernia, migraines, allergies, plantar fasciitis, spinal stenosis, history of kidney stones, osteoarthritis, depression, trigeminal neuralgia, basal cell cancer and anxiety, who presents for follow-up consultation of her hand tremors. The patient is unaccompanied today. I first met her on 01/20/2017 at the request of her primary care physician, at which time she reported a 2 to three-month history of hand tremors but also tremors affecting her upper trunk and head area at times. She was worried about having Parkinson's disease. I did not think she had any signs or symptoms of parkinsonism. I reassured her in that regard. Her history and family history were not in keeping with essential tremor. She did report significant stressors recently. She was encouraged to talk to her PCP about anxiety management.  Today, 07/21/2016 (all dictated new, as well as above notes, some dictation done in note pad or Word, outside of chart, may appear as copied):   She reports she has had no recent tremors. Had muscle cramps and was taken off her chol medication, Crestor, and cramps improved. Repeat chol numbers increased some in April 2018, compared to 10/17. Started seeing a nutritionist and feels better, goes to the gym about 5 days a week, when she can, recent URI. Husband is seeking counseling, which has helped stress level.   The patient's allergies, current medications, family history, past medical history, past social history, past surgical history and problem list were reviewed and updated as appropriate.   Previously (copied from previous notes for reference):   01/21/2016: She  reports an approximately two to 3 month history of hand tremors but also tremor affecting her legs, trunk, neck area. Looking back, she has noticed a tremor in the past, one or 2 years ago when doing fine motor tasks such as applying mascara. She has had in the past additional stressors as she was taking care of her elderly mother. She is worried about developing Parkinson's disease. I reviewed your office note from 12/28/2015. You noticed a hand tremor right more than left and a mild voice tremor. You felt that her tremor could be due to anxiety. She has no FHx of tremors or PD, one sister with MS, 2 sisters with fibromyalgia, 3 sisters with migraines. She has 5 sisters and 2 brothers, most siblings with depression and/or anxiety and with OA.  She has had significant stressors in the past year, particularly with her husband's health and mental health from what I understand. Patient has been in counseling and has been very active socially and with her church. Marriage counseling has been discussed as well, but they are not yet eligible for it, as I understand. She does endorse occasional anxiety but has not been on any medication for this. She is physically very active, she does tai chi, yoga, water aerobics, swimming, cycling, weight lifts, line dancing. She usually exercises one or 2 hours per day, typically between 9 and 11 AM. Prior to her cancer diagnosis she was use to walking 3-4 miles per day. She has 2 biological children and 2 stepchildren. She is retired. She lives with her husband and one of her 2 stepsons. She is a nonsmoker, does not drink alcohol or use illicit drugs and  does not use caffeine on a daily basis. She sleeps fairly well. She hydrates well with water. She had recent blood work on 10/30/2015 which we reviewed. TSH was normal, liver function test was normal, BMP normal, lipid profile normal. Tremor tends to be worse when she has been around her husband at times.    Her Past Medical  History Is Significant For: Past Medical History:  Diagnosis Date  . Allergy   . Asthma   . Breast cancer of 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 breast ([place]) 11/13/2014  . GERD (gastroesophageal reflux disease)   . H/O bilateral inguinal hernia repair 1992  . Hypercholesteremia   . Migraines   . Plantar fasciitis, bilateral   . Radiation 01/12/15-02/04/15   right breast 42.17 gray  . Spinal stenosis   . Thyroid disease     Her Past Surgical History Is Significant For: Past Surgical History:  Procedure Laterality Date  . BLADDER SUSPENSION    . BLEPHAROPLASTY Bilateral   . BREAST LUMPECTOMY WITH RADIOACTIVE SEED LOCALIZATION Right 12/10/2014   Procedure: RIGHT BREAST LUMPECTOMY WITH RADIOACTIVE SEED LOCALIZATION;  Surgeon: Autumn Messing III, MD;  Location: [facility];  Service: General;  Laterality: Right;  . FOOT SURGERY     plantar fashiectomies from both feet  . HERNIA REPAIR    . NASAL SEPTUM SURGERY    . neuromas removed from right foot    . 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 removal of basal cell carcinoma (nose)    . TONSILECTOMY/ADENOIDECTOMY WITH MYRINGOTOMY    . TONSILLECTOMY      Her Family History Is Significant For: Family History  Problem Relation Age of Onset  . Pancreatic cancer Maternal Aunt        dx. 40s-early 60s  . Congestive Heart Failure Mother 79  . Lung disease Mother   . Colon polyps Mother        unspecified number  . Dementia Father        Lewy Body Dementia  . Kidney failure Father   . Stroke Father   . Depression Father   . Multiple sclerosis Sister   . Depression Sister   . Aneurysm Maternal Grandmother        brain  . Heart attack Maternal Grandfather   . Emphysema Paternal Grandfather   . Heart Problems Paternal Grandfather   . Other Sister        3 sisters have history of cysts in uterus  . Depression Sister   . Breast cancer Cousin        dx. 45-50  . Testicular cancer Cousin        dx. 2s  . Colon  polyps Daughter        unspecified number    Her Social History Is Significant For: Social History   Social History  . Marital status: Married    Spouse name: N/A  . Number of children: 2  . Years of education: N/A   Occupational History  . 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 Smoking status: Never Smoker  . Smokeless tobacco: Never Used  . Alcohol use No  . Drug use: No  . Sexual activity: Not Asked   Other Topics Concern  . None   Social History Narrative  . None    Her Allergies Are:  Allergies  Allergen Reactions  . Keflex [Cephalexin]     Puffy eyes and lips  .  Toradol [Ketorolac Tromethamine]     Puffy lips and eyes  :   Her Current Medications Are:  Outpatient Encounter Prescriptions as of 07/21/2016  Medication Sig  . albuterol (VENTOLIN HFA) 108 (90 Base) MCG/ACT inhaler Inhale 2 puffs into the lungs every 4 (four) hours as needed for wheezing or shortness of breath (cough).  . calcium carbonate (CALCIUM 600) 1500 (600 CA) MG TABS tablet Take by mouth 2 (two) times daily with a meal.  . cetirizine (ZYRTEC) 10 MG tablet Take 10 mg by mouth daily.  . Cholecalciferol (D-3-5) 5000 UNITS capsule Take 5,000 Units by mouth daily.  . Coenzyme Q10 (CO Q-10) 100 MG CAPS Take 1 capsule by mouth daily.   Marland Kitchen EPINEPHrine (EPIPEN 2-PAK) 0.3 mg/0.3 mL IJ SOAJ injection Inject 0.3 mLs (0.3 mg total) into the muscle once as needed. Reported on 01/20/2015  . fluticasone (FLONASE) 50 MCG/ACT nasal spray Use one to two sprays in each nostril once daily.  . lansoprazole (PREVACID) 30 MG capsule Take 30 mg by mouth daily before breakfast.   . levothyroxine (SYNTHROID, LEVOTHROID) 100 MCG tablet Take 100 mcg by mouth daily before breakfast.  . Multiple Vitamins-Minerals (CENTRUM SILVER ADULT 50+ PO) Take by mouth.  . Olopatadine HCl 0.6 % SOLN Can use 2 sprays in each nostril 1 to 2 times a day as needed for runny nose or congestion.  .  Omega-3 Fatty Acids (FISH OIL) 1000 MG CAPS Take 1 capsule by mouth daily.   . Probiotic Product (SOLUBLE FIBER/PROBIOTICS PO) Take 1 capsule by mouth daily. 20 billion  . ranitidine (ZANTAC) 300 MG tablet Take 1 tablet (300 mg total) by mouth at bedtime.  . sodium chloride (OCEAN) 0.65 % SOLN nasal spray Place 1 spray into both nostrils as needed for congestion.  . SUMAtriptan (IMITREX) 25 MG tablet Take 25 mg by mouth every 2 (two) hours as needed for migraine. Reported on 01/20/2015  . tamoxifen (NOLVADEX) 20 MG tablet TAKE 1 TABLET DAILY (START FEBRUARY 1ST)  . tolterodine (DETROL LA) 4 MG 24 hr capsule Take 4 mg by mouth at bedtime.   . TURMERIC CURCUMIN PO Take 1,000 mg by mouth daily.   Marland Kitchen UNABLE TO FIND Med Name: Allergy shots  . [DISCONTINUED] Mometasone Furoate (ASMANEX HFA) 200 MCG/ACT AERO Inhale 1 puff into the lungs daily.  . [DISCONTINUED] rosuvastatin (CRESTOR) 20 MG tablet Take 20 mg by mouth daily. Reported on 01/20/2015   No facility-administered encounter medications on file as of 07/21/2016.   :  Review of Systems:  Out of a complete 14 point review of systems, all are reviewed and negative with the exception of these symptoms as listed below:  Review of Systems  Neurological:       Pt presents today to discuss her tremor. Pt reports that her tremor is almost entirely gone. Pt stopped taking crestor and wonders if the crestor caused her tremor.    Objective:  Neurological Exam  Physical Exam Physical Examination:   Vitals:   07/21/16 1437  BP: 121/65  Pulse: 81   General Examination: The patient is a very pleasant 66 y.o. female in no acute distress. She appears well-developed and well-nourished and well groomed. Less anxious.  HEENT: Normocephalic, atraumatic, pupils are equal, round and reactive to light and accommodation. Extraocular tracking is good without limitation to gaze excursion or nystagmus noted. Normal smooth pursuit is noted. Hearing is grossly  intact. Face is symmetric with normal facial animation and normal facial sensation. Speech is  clear with no dysarthria noted. There is no hypophonia. There is no lip, neck/head, jaw or voice tremor. Neck is supple with full range of passive and active motion. There are no carotid bruits on auscultation. Oropharynx exam reveals: mild mouth dryness, adequate dental hygiene and mild airway crowding. Tonsils are absent. Mallampati is class I. Tongue protrudes centrally and palate elevates symmetrically.   Chest: Clear to auscultation without wheezing, rhonchi or crackles noted.  Heart: S1+S2+0, regular and normal without murmurs, rubs or gallops noted.   Abdomen: Soft, non-tender and non-distended with normal bowel sounds appreciated on auscultation.  Extremities: There is no pitting edema in the distal lower extremities bilaterally.  Skin: Warm and dry without trophic changes noted. There are no varicose veins.  Musculoskeletal: exam reveals no obvious joint deformities, tenderness or joint swelling or erythema.   Neurologically:  Mental status: The patient is awake, alert and oriented in all 4 spheres. Her immediate and remote memory, attention, language skills and fund of knowledge are appropriate. There is no evidence of aphasia, agnosia, apraxia or anomia. Speech is clear with normal prosody and enunciation. Thought process is linear. Mood is normal and affect is normal, mildly anxious.  Cranial nerves II - XII are as described above under HEENT exam. In addition: shoulder shrug is normal with equal shoulder height noted. Motor exam: Normal bulk, strength and tone is noted. There is no drift, or rebound, no resting, or postural or action tremor.   (On 01/21/2016: There is no resting tremor. There is a bilateral upper extremity postural tremor, which is very mild on the right and minimal on the left. She has a minimal degree of action tremor bilaterally. She has no tremor elsewhere. On  Archimedes spiral drawing, she has no significant trembling, mild difficulty on the left, handwriting is not tremulous, legible, not micrographic.)    Romberg is negative. Reflexes are 2+ throughout. Fine motor skills and coordination: intact with normal finger taps, normal hand movements, normal rapid alternating patting, normal foot taps and normal foot agility.  Cerebellar testing: No dysmetria or intention tremor on finger to nose testing. There is no truncal or gait ataxia.  Sensory exam: intact to light touch in the upper and lower extremities.  Gait, station and balance: She stands easily. No veering to one side is noted. No leaning to one side is noted. Posture is age-appropriate and stance is narrow based. Gait shows normal stride length and normal pace. No problems turning are noted. Tandem walk is unremarkable.   Assessment and Plan:     In summary, Darnesha Diloreto is a very pleasant 66 year old female with an underlying medical history of breast cancer (s/p right lumpectomy and XRT, no chemotherapy, on tamoxifen), history of hypothyroidism, hyperlipidemia, reflux disease with hiatal hernia, migraines, allergies, plantar fasciitis, spinal stenosis, history of kidney stones, osteoarthritis, depression, trigeminal neuralgia, basal cell cancer and anxiety, who presents for follow consultation of her tremors. She has noticed Improvement in her tremors. She feels that tremors improved after she was taken off of Crestor. Of note, her exam is benign, she does not have any significant tremor at this time. Her stress level and anxiety have reduced as well. This is more likely to have an impact on her tremor than the Crestor in my opinion, nevertheless, she feels improved after coming off of Crestor, particularly with respect to having leg cramps. She is trying to work on cholesterol management with the help of a nutritionist. She is exercising regularly. She is trying  to hydrate well with water.  At  this juncture, I suggested as needed follow up. I answered all her questions today and she was in agreement. I spent 15 minutes in total face-to-face time with the patient, more than 50% of which was spent in counseling and coordination of care, reviewing test results, reviewing medication and discussing or reviewing the diagnosis of tremor, its prognosis and treatment options. Pertinent laboratory and imaging test results that were available during this visit with the patient were reviewed by me and considered in my medical decision making (see chart for details).

## 2016-07-21 NOTE — Patient Instructions (Addendum)
Your tremor is better, I am pleased to see.  I can see you back as needed.

## 2016-09-29 ENCOUNTER — Encounter: Payer: 59 | Attending: Family Medicine | Admitting: Registered"

## 2016-09-29 DIAGNOSIS — E7800 Pure hypercholesterolemia, unspecified: Secondary | ICD-10-CM | POA: Insufficient documentation

## 2016-09-29 DIAGNOSIS — Z713 Dietary counseling and surveillance: Secondary | ICD-10-CM | POA: Insufficient documentation

## 2016-09-29 DIAGNOSIS — E78019 Familial hypercholesterolemia, unspecified: Secondary | ICD-10-CM

## 2016-09-29 DIAGNOSIS — E7801 Familial hypercholesterolemia: Secondary | ICD-10-CM

## 2016-09-29 NOTE — Patient Instructions (Addendum)
Benecol can just be used as you would use butter, not an exact amount needed. Drink POM however you enjoy it, with or without a meal. Check with pharmacist about potential grapefruit interaction.

## 2016-09-29 NOTE — Progress Notes (Signed)
Medical Nutrition Therapy:  Appt start time: 6568 end time:  1275.  Follow-up Assessment:  Patient is here with updated labs showing improvements in her cholesterol levels. Pt is motivated to continue with diet changes to prevent need to start statin medication again. Pt states she will have blood work done again Nov 15, 2016  (not on statin) 05/07/16 labs 09/29/16 pt reported updated labs    Cholesterol 225 210    Triglycerides 110 102    HDL 73 68    LDL 130 122    Ratio 3.1 3.1   Physical activity: Pt states she started interval training 5 days/wk, 30 min, to prepare for a 5K cancer survivor run. Pt states she also attends a 60 min group class 5x week which she enjoys for the activity and social engagement.  Pt states she has increased fish intake (tuna, white, mackerel, sardines, salmon), eats more nuts, oatmeal daily, and avocado regularly  2017 she received a cancer survivorship scholarship to [employer] and started working out 1-2 hrs day 5-6 days week with trainer. Pt states she feels emotionally and physically better since she started this exercise routine.  Preferred Learning Style:   No preference indicated   Learning Readiness:   Change in progress  MEDICATIONS: reviewed   DIETARY INTAKE:  Usual eating pattern includes 3 meals and 0-1 snacks per day.  Everyday foods include oatmeal, POM juice, square dark chocolate   Estimated energy needs: 1800 calories 200 g carbohydrates 135 g protein 50 g fat  Progress Towards Goal(s):  In progress.   Nutritional Diagnosis:  NB-1.1 Food and nutrition-related knowledge deficit As related to foods sources to lower cholesterol.  As evidenced by diet recall and patient reported nutrition education provided new information..    Intervention:  Nutrition Education. Provided clarification on serving sizes of healthy fats and timing of foods and supplements.  Plan:  Benecol can just be used as you would use butter, not an exact amount  needed. Drink POM however you enjoy it, with or without a meal. Check with pharmacist about potential grapefruit interaction with medications  Teaching Method Utilized:  Visual Auditory  Handouts given during visit include:   none  Barriers to learning/adherence to lifestyle change: none  Demonstrated degree of understanding via:  Teach Back   Monitoring/Evaluation:  Dietary intake, exercise, and body weight prn.

## 2016-10-04 ENCOUNTER — Other Ambulatory Visit: Payer: Self-pay | Admitting: Hematology and Oncology

## 2016-10-04 DIAGNOSIS — Z85841 Personal history of malignant neoplasm of brain: Secondary | ICD-10-CM

## 2016-10-26 ENCOUNTER — Other Ambulatory Visit: Payer: Self-pay | Admitting: Hematology and Oncology

## 2016-10-26 DIAGNOSIS — Z853 Personal history of malignant neoplasm of breast: Secondary | ICD-10-CM

## 2016-11-18 ENCOUNTER — Encounter (HOSPITAL_COMMUNITY): Payer: Self-pay | Admitting: Emergency Medicine

## 2016-11-18 ENCOUNTER — Ambulatory Visit
Admission: RE | Admit: 2016-11-18 | Discharge: 2016-11-18 | Disposition: A | Discharge status: Home / Self Care | Payer: 59 | Source: Ambulatory Visit | Attending: Hematology and Oncology | Admitting: Hematology and Oncology

## 2016-11-18 ENCOUNTER — Emergency Department (HOSPITAL_COMMUNITY): Payer: 59

## 2016-11-18 ENCOUNTER — Emergency Department (HOSPITAL_COMMUNITY)
Admission: EM | Admit: 2016-11-18 | Discharge: 2016-11-18 | Disposition: A | Discharge status: Home / Self Care | Payer: 59 | Attending: Emergency Medicine | Admitting: Emergency Medicine

## 2016-11-18 DIAGNOSIS — Z79899 Other long term (current) drug therapy: Secondary | ICD-10-CM | POA: Insufficient documentation

## 2016-11-18 DIAGNOSIS — Y999 Unspecified external cause status: Secondary | ICD-10-CM | POA: Insufficient documentation

## 2016-11-18 DIAGNOSIS — R51 Headache: Secondary | ICD-10-CM | POA: Insufficient documentation

## 2016-11-18 DIAGNOSIS — M546 Pain in thoracic spine: Secondary | ICD-10-CM | POA: Diagnosis not present

## 2016-11-18 DIAGNOSIS — M7918 Myalgia, other site: Secondary | ICD-10-CM

## 2016-11-18 DIAGNOSIS — J45909 Unspecified asthma, uncomplicated: Secondary | ICD-10-CM | POA: Diagnosis not present

## 2016-11-18 DIAGNOSIS — Y939 Activity, unspecified: Secondary | ICD-10-CM | POA: Diagnosis not present

## 2016-11-18 DIAGNOSIS — Z853 Personal history of malignant neoplasm of breast: Secondary | ICD-10-CM

## 2016-11-18 DIAGNOSIS — M542 Cervicalgia: Secondary | ICD-10-CM | POA: Diagnosis not present

## 2016-11-18 DIAGNOSIS — D0591 Unspecified type of carcinoma in situ of right breast: Secondary | ICD-10-CM | POA: Diagnosis not present

## 2016-11-18 HISTORY — DX: Personal history of irradiation: Z92.3

## 2016-11-18 MED ORDER — LORAZEPAM 0.5 MG PO TABS
0.5000 mg | ORAL_TABLET | Freq: Once | ORAL | Status: AC
  Administered 2016-11-18: 0.5 mg via ORAL
  Filled 2016-11-18: qty 1

## 2016-11-18 MED ORDER — ACETAMINOPHEN 325 MG PO TABS
650.0000 mg | ORAL_TABLET | Freq: Once | ORAL | Status: AC
  Administered 2016-11-18: 650 mg via ORAL
  Filled 2016-11-18: qty 2

## 2016-11-18 MED ORDER — CYCLOBENZAPRINE HCL 5 MG PO TABS: 5.0000 mg | ORAL_TABLET | Freq: Two times a day (BID) | ORAL | 0 refills | Status: DC | PRN

## 2016-11-18 NOTE — Discharge Instructions (Signed)
Take meloxicam daily.  You may start that tonight. Use Tylenol as needed throughout the day for pain.  You may take up to 1000 mg 3 times a day. You may use ice packs or heating packs if this helps control your symptoms. Use Flexeril twice a day as needed for muscle stiffness or soreness.  Have caution, as this may make you tired.  Do not drive or operate heavy machinery until you know how this affects you. You will likely have continued muscle stiffness and soreness over the next several days.  Follow-up with your primary care doctor in 1 week if your pain is not improving. Return to the emergency room if you develop vision changes, vomiting, numbness, loss of bowel or bladder control, or any new or worsening symptoms.
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
## 2016-11-28 ENCOUNTER — Telehealth: Payer: Self-pay | Admitting: Neurology

## 2016-11-28 NOTE — Telephone Encounter (Signed)
Pt returned RN's call. Msg relayed, an appt has 02/01/17 and is on the wait list.

## 2016-11-28 NOTE — Telephone Encounter (Signed)
Nothing further needed 

## 2016-11-28 NOTE — Telephone Encounter (Signed)
Pt asking if the CT scan was receieved from Dr Leighton Ruff, pt asking to be called

## 2016-11-28 NOTE — Telephone Encounter (Signed)
I called pt to discuss, no answer, left a message asking her to call me back.  Dr. Rexene Alberts has received CT results, has not reviewed them as of now, but Dr. Drema Dallas will need to discuss the results with the pt. Dr. Drema Dallas requested that pt follow up with Dr. Rexene Alberts. An appt needs to be scheduled.

## 2016-12-06 ENCOUNTER — Encounter: Payer: Self-pay | Admitting: Neurology

## 2016-12-06 ENCOUNTER — Ambulatory Visit: Payer: 59 | Admitting: Neurology

## 2016-12-06 VITALS — BP 122/71 | HR 81 | Ht 65.75 in | Wt 143.0 lb

## 2016-12-06 DIAGNOSIS — R51 Headache: Secondary | ICD-10-CM | POA: Diagnosis not present

## 2016-12-06 DIAGNOSIS — R519 Headache, unspecified: Secondary | ICD-10-CM

## 2016-12-06 DIAGNOSIS — R41840 Attention and concentration deficit: Secondary | ICD-10-CM

## 2016-12-06 NOTE — Patient Instructions (Addendum)
Please monitor your symptoms, you will likely improve with time. You can continue to use tylenol as needed for headache.  You have chronic changes on the head CT and neck CT. You may consider seeing a neck specialist, should you have ongoing neck pain.   Stay well hydrated, well rested. Your neurological exam looks good and stable. Drive, when you feel ready.  For your blurry vision and mild cataracts, you may benefit from seeing an ophthalmologist, your eye exam is good from my end of things.   We can likely see you back as needed.
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
## 2016-12-10 IMAGING — MG MM SCREENING BREAST TOMO BILATERAL
1 series · 1 of 1 positions shown · non-contrast
Comparison: Previous exam(s).

CLINICAL DATA: Screening.

EXAM:
DIGITAL SCREENING BILATERAL MAMMOGRAM WITH 3D TOMO WITH CAD

[L MLO tomo]
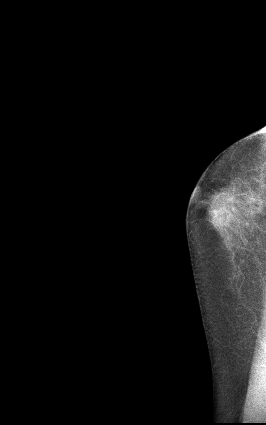

[1 of 1 positions shown; findings below may reference images not displayed]

ACR Breast Density Category c: The breast tissue is heterogeneously
dense, which may obscure small masses.
FINDINGS: In the right breast, calcifications warrant further evaluation with
magnified views. In the left breast, no findings suspicious for
malignancy. Images were processed with CAD.
IMPRESSION: Further evaluation is suggested for calcifications in the right
breast.

RECOMMENDATION:
Diagnostic mammogram of the right breast. (Code:93-N-CC9)

The patient will be contacted regarding the findings, and additional
imaging will be scheduled.

BI-RADS CATEGORY  0: Incomplete. Need additional imaging evaluation
and/or prior mammograms for comparison.

## 2017-01-19 ENCOUNTER — Ambulatory Visit: Payer: 59 | Admitting: Neurology

## 2017-01-19 ENCOUNTER — Encounter: Payer: Self-pay | Admitting: Neurology

## 2017-01-19 VITALS — BP 134/80 | HR 90 | Ht 65.0 in | Wt 145.0 lb

## 2017-01-19 DIAGNOSIS — R41840 Attention and concentration deficit: Secondary | ICD-10-CM

## 2017-01-19 DIAGNOSIS — R4702 Dysphasia: Secondary | ICD-10-CM | POA: Diagnosis not present

## 2017-01-19 DIAGNOSIS — F419 Anxiety disorder, unspecified: Secondary | ICD-10-CM | POA: Diagnosis not present

## 2017-01-19 DIAGNOSIS — R4789 Other speech disturbances: Secondary | ICD-10-CM | POA: Diagnosis not present

## 2017-01-19 DIAGNOSIS — R51 Headache: Secondary | ICD-10-CM

## 2017-01-19 DIAGNOSIS — R519 Headache, unspecified: Secondary | ICD-10-CM

## 2017-01-19 DIAGNOSIS — F439 Reaction to severe stress, unspecified: Secondary | ICD-10-CM | POA: Diagnosis not present

## 2017-01-19 NOTE — Patient Instructions (Addendum)
Your memory score is good. Your neurological exam is good/nonfocal.   For better look at your brain: We will do a brain scan, called MRI and call you with the test results. We will have to schedule you for this on a separate date. This test requires authorization from your insurance, and we will take care of the insurance process.  We will request formal neuropsychological test (aka cognitive testing) for your memory complaints. This requires a referral to a trained and licensed neuropsychologist and will be a separate appointment at a different clinic.   We will do an EEG (brainwave test), which we will schedule. We will call you with the results.  Please talk to Dr. Drema Dallas about stress management.
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
## 2017-01-26 ENCOUNTER — Encounter: Payer: Self-pay | Admitting: Psychology

## 2017-01-27 ENCOUNTER — Ambulatory Visit
Admission: RE | Admit: 2017-01-27 | Discharge: 2017-01-27 | Disposition: A | Discharge status: Home / Self Care | Payer: 59 | Source: Ambulatory Visit | Attending: Neurology | Admitting: Neurology

## 2017-01-27 DIAGNOSIS — F439 Reaction to severe stress, unspecified: Secondary | ICD-10-CM

## 2017-01-27 DIAGNOSIS — F419 Anxiety disorder, unspecified: Secondary | ICD-10-CM

## 2017-01-27 DIAGNOSIS — R51 Headache: Secondary | ICD-10-CM

## 2017-01-27 DIAGNOSIS — R519 Headache, unspecified: Secondary | ICD-10-CM

## 2017-01-27 DIAGNOSIS — R41840 Attention and concentration deficit: Secondary | ICD-10-CM

## 2017-01-27 DIAGNOSIS — R4789 Other speech disturbances: Secondary | ICD-10-CM

## 2017-01-27 MED ORDER — GADOBENATE DIMEGLUMINE 529 MG/ML IV SOLN
13.0000 mL | Freq: Once | INTRAVENOUS | Status: AC | PRN
  Administered 2017-01-27: 13 mL via INTRAVENOUS

## 2017-01-30 NOTE — Progress Notes (Signed)
Please call and advise the patient that the recent scan we did was within normal limits. We did a brain MRI wo contrast, which showed normal for age findings. As discussed, will proceed with EEG this week and neuropsych appts in Feb. Will keep her posted.  Thanks,  Star Age, MD, PhD

## 2017-02-01 ENCOUNTER — Ambulatory Visit: Payer: 59 | Admitting: Neurology

## 2017-02-01 ENCOUNTER — Telehealth: Payer: Self-pay

## 2017-02-01 DIAGNOSIS — R519 Headache, unspecified: Secondary | ICD-10-CM

## 2017-02-01 DIAGNOSIS — R410 Disorientation, unspecified: Secondary | ICD-10-CM | POA: Diagnosis not present

## 2017-02-01 DIAGNOSIS — F439 Reaction to severe stress, unspecified: Secondary | ICD-10-CM

## 2017-02-01 DIAGNOSIS — R51 Headache: Secondary | ICD-10-CM

## 2017-02-01 DIAGNOSIS — R4789 Other speech disturbances: Secondary | ICD-10-CM

## 2017-02-01 DIAGNOSIS — F419 Anxiety disorder, unspecified: Secondary | ICD-10-CM

## 2017-02-01 DIAGNOSIS — R41840 Attention and concentration deficit: Secondary | ICD-10-CM

## 2017-02-01 NOTE — Telephone Encounter (Signed)
I called pt. I advised her that her MRI brain was within normal limits and showed normal for age findings. Pt should proceed with EEG this week and neuropsych appts in Feb and we will keep her posted. Pt verbalized understanding of results. Pt had no questions at this time but was encouraged to call back if questions arise.

## 2017-02-01 NOTE — Telephone Encounter (Signed)
-----   Message from Star Age, MD sent at 01/30/2017 10:10 AM EST ----- Please call and advise the patient that the recent scan we did was within normal limits. We did a brain MRI wo contrast, which showed normal for age findings. As discussed, will proceed with EEG this week and neuropsych appts in Feb. Will keep her posted.  Thanks,  Star Age, MD, PhD

## 2017-02-03 NOTE — Progress Notes (Signed)
Please call and advise the patient that the EEG or brain wave test we performed was reported as normal in the awake state. We checked for abnormal electrical discharges in the brain waves and the report suggested normal findings. No further action is required on this test at this time. Please remind patient to keep any upcoming appointments or tests and to call us with any interim questions, concerns, problems or updates. Thanks,  Carylon Tamburro, MD, PhD  

## 2017-02-03 NOTE — Procedures (Signed)
   HISTORY: 67 years old female complains of headache, memory loss.  TECHNIQUE:  16 channel EEG was performed based on standard 10-16 international system. One channel was dedicated to EKG, which has demonstrates normal sinus rhythm of 78 beats per minutes.  Upon awakening, the posterior background activity was well-developed, with mixed alpha and beta range activities, reactive to eye opening and closure.  There was no evidence of epileptiform discharge.  Photic stimulation was performed, which induced a symmetric photic driving.  Hyperventilation was performed, there was no abnormality elicit.  No sleep was achieved.  CONCLUSION: This is a  normal awake EEG.  There is no electrodiagnostic evidence of epileptiform discharge.  Marcial Pacas, M.D. Ph.D.  [facility] [place], [place] [address] Phone: [phone] Fax:      [phone]

## 2017-02-06 ENCOUNTER — Telehealth: Payer: Self-pay

## 2017-02-06 ENCOUNTER — Other Ambulatory Visit: Payer: 59

## 2017-02-06 NOTE — Telephone Encounter (Signed)
-----   Message from Star Age, MD sent at 02/03/2017  8:30 PM EST ----- Please call and advise the patient that the EEG or brain wave test we performed was reported as normal in the awake state. We checked for abnormal electrical discharges in the brain waves and the report suggested normal findings. No further action is required on this test at this time. Please remind patient to keep any upcoming appointments or tests and to call us with any interim questions, concerns, problems or updates. Thanks,  Star Age, MD, PhD

## 2017-02-06 NOTE — Telephone Encounter (Signed)
I called pt, left a detailed message on her cell phone, per DPR, advising her that her EEG was normal and to call us back with any questions or concerns.

## 2017-02-14 ENCOUNTER — Telehealth: Payer: Self-pay

## 2017-02-14 NOTE — Telephone Encounter (Addendum)
Patient called in today to speak with someone about her medications.   I took the call and spoke with her.  Patient states that she was seen 2 weeks ago by her PCP. She had some sinus congestion and chest congestion, but her PCP did not hear anything in her lungs. She did have a fever of 103 at that time. She has since developed body aches and fever that has been ongoing for 4 days of 103. I did go over how to use the albuterol and Asmanex inhalers. She has not used the Asmanex yet because she was unsure if she could use it in conjunction with the albuterol. I did verify with patient that she received her flu vaccine this year. I advised her that due to ongoing symptoms and the new joint/body aches she should be seen by either her PCP or at urgent care because it sounds as if she has developed something that she needs an antibiotic for. Patient was agreeable with this recommendation and thanked me for all of my help.

## 2017-03-06 ENCOUNTER — Ambulatory Visit: Payer: 59 | Admitting: Psychology

## 2017-03-06 ENCOUNTER — Encounter: Payer: Self-pay | Admitting: Psychology

## 2017-03-06 DIAGNOSIS — S060X0S Concussion without loss of consciousness, sequela: Secondary | ICD-10-CM

## 2017-03-06 DIAGNOSIS — R4189 Other symptoms and signs involving cognitive functions and awareness: Secondary | ICD-10-CM

## 2017-03-06 DIAGNOSIS — F430 Acute stress reaction: Secondary | ICD-10-CM
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
## 2017-03-06 NOTE — Progress Notes (Signed)
   Neuropsychology Note  Valerie Snyder completed 90 minutes of neuropsychological testing with technician, Valerie Snyder, BS, under the supervision of Dr. Macarthur Critchley, Licensed Psychologist. The patient did not appear overtly distressed by the testing session, per behavioral observation or via self-report to the technician. Rest breaks were offered.   Clinical Decision Making: In considering the patient's current level of functioning, level of presumed impairment, nature of symptoms, emotional and behavioral responses during the interview, level of literacy, and observed level of motivation/effort, a battery of tests was selected and communicated to the psychometrician.  Communication between the psychologist and technician was ongoing throughout the testing session and changes were made as deemed necessary based on patient performance on testing, technician observations and additional pertinent factors such as those listed above.  Valerie Snyder will return within approximately 2 weeks for an interactive feedback session with Dr. Si Snyder at which time her test performances, clinical impressions and treatment recommendations will be reviewed in detail. The patient understands she can contact our office should she require our assistance before this time.  20 minutes spent performing neuropsychological evaluation services/clinical decision making (psychologist). [CPT 80881] 10 minutes spent face-to-face with patient administering standardized tests, 30 minutes spent scoring (technician). [CPT Y8200648, 31594]  Full report to follow.

## 2017-03-07 ENCOUNTER — Encounter: Payer: Self-pay | Admitting: Physical Therapy

## 2017-03-07 ENCOUNTER — Other Ambulatory Visit: Payer: Self-pay

## 2017-03-07 ENCOUNTER — Ambulatory Visit: Payer: 59 | Attending: Orthopaedic Surgery | Admitting: Physical Therapy

## 2017-03-07 DIAGNOSIS — R252 Cramp and spasm: Secondary | ICD-10-CM | POA: Diagnosis present

## 2017-03-07 DIAGNOSIS — F0781 Postconcussional syndrome: Secondary | ICD-10-CM | POA: Insufficient documentation

## 2017-03-07 DIAGNOSIS — M542 Cervicalgia: Secondary | ICD-10-CM | POA: Insufficient documentation

## 2017-03-07 DIAGNOSIS — M6281 Muscle weakness (generalized): Secondary | ICD-10-CM | POA: Insufficient documentation
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
## 2017-03-13 ENCOUNTER — Ambulatory Visit: Payer: 59 | Admitting: Psychology

## 2017-03-13 ENCOUNTER — Encounter: Payer: Self-pay | Admitting: Psychology

## 2017-03-13 DIAGNOSIS — F430 Acute stress reaction: Secondary | ICD-10-CM | POA: Diagnosis not present

## 2017-03-13 DIAGNOSIS — F0781 Postconcussional syndrome: Secondary | ICD-10-CM

## 2017-03-13 DIAGNOSIS — S060X0S Concussion without loss of consciousness, sequela: Secondary | ICD-10-CM

## 2017-03-14 ENCOUNTER — Ambulatory Visit: Payer: 59 | Admitting: Allergy and Immunology

## 2017-03-14 ENCOUNTER — Ambulatory Visit: Payer: 59 | Admitting: Physical Therapy

## 2017-03-14 ENCOUNTER — Telehealth: Payer: Self-pay | Admitting: Psychology

## 2017-03-14 ENCOUNTER — Encounter: Payer: Self-pay | Admitting: Allergy and Immunology

## 2017-03-14 VITALS — BP 110/70 | HR 88 | Resp 20

## 2017-03-14 DIAGNOSIS — K219 Gastro-esophageal reflux disease without esophagitis: Secondary | ICD-10-CM | POA: Diagnosis not present

## 2017-03-14 DIAGNOSIS — G9331 Postviral fatigue syndrome: Secondary | ICD-10-CM

## 2017-03-14 DIAGNOSIS — J3089 Other allergic rhinitis: Secondary | ICD-10-CM

## 2017-03-14 DIAGNOSIS — G933 Postviral fatigue syndrome: Secondary | ICD-10-CM | POA: Diagnosis not present

## 2017-03-14 DIAGNOSIS — J4531 Mild persistent asthma with (acute) exacerbation: Secondary | ICD-10-CM

## 2017-03-14 MED ORDER — METHYLPREDNISOLONE ACETATE 80 MG/ML IJ SUSP
80.0000 mg | Freq: Once | INTRAMUSCULAR | Status: AC
  Administered 2017-03-14: 80 mg via INTRAMUSCULAR

## 2017-03-14 NOTE — Telephone Encounter (Signed)
Pt left a message asking for a call back regarding her report on the testing and feels there is a mistake on there and would like a call back CB# [phone]

## 2017-03-14 NOTE — Progress Notes (Signed)
Follow-up Note  Referring Provider: Leighton Ruff, MD Primary Provider: Leighton Ruff, MD Date of Office Visit: 03/14/2017  Subjective:   Valerie Snyder (DOB: 1950-07-18) is a 67 y.o. female who returns to the [facility] on 03/14/2017 in re-evaluation of the following:  HPI: Valerie Snyder returns to this clinic in reevaluation of her allergic rhinoconjunctivitis and asthma and LPR.  I last saw her in this clinic 12 April 2016.  She was doing wonderful regarding her upper and lower respiratory tract issue with minimal requirement for short acting bronchodilator and no need for systemic steroid or an type of problem with her respiratory tract.  Unfortunately, mid January she developed influenza and was quite sick.  Even today she still has some coughing and wheezing and must use a bronchodilator since she was treated with Tamiflu.  She is better and she does not have any chest pain or sputum production or fever but she still has coughing and she still has an irritated throat at this point in time.  She did receive the flu vaccine this year.  Allergies as of 03/14/2017      Reactions   Bee Venom Anaphylaxis   Keflex [cephalexin] Anaphylaxis   Puffy eyes and lips   Toradol [ketorolac Tromethamine] Anaphylaxis   Puffy lips and eyes      Medication List      albuterol 108 (90 Base) MCG/ACT inhaler Commonly known as:  VENTOLIN HFA Inhale 2 puffs into the lungs every 4 (four) hours as needed for wheezing or shortness of breath (cough).   AYR 0.65 % nasal spray Generic drug:  sodium chloride Place 1 spray into the nose as needed for congestion.   Calcium-Vitamin D-Vitamin K 500-100-40 MG-UNT-MCG Chew Chew 2 tablets by mouth daily.   CENTRUM SILVER ADULT 50+ PO Take by mouth.   cetirizine 10 MG tablet Commonly known as:  ZYRTEC Take 10 mg by mouth daily.   Co Q-10 100 MG Caps Take 1 capsule by mouth daily.   Cranberry 450 MG Caps Take by mouth.     cyclobenzaprine 5 MG tablet Commonly known as:  FLEXERIL Take 1 tablet (5 mg total) by mouth 2 (two) times daily as needed for muscle spasms.   D-3-5 5000 units capsule Generic drug:  Cholecalciferol Take 5,000 Units by mouth daily.   EPINEPHrine 0.3 mg/0.3 mL Soaj injection Commonly known as:  EPIPEN 2-PAK Inject 0.3 mLs (0.3 mg total) into the muscle once as needed. Reported on 01/20/2015   Fish Oil 1000 MG Caps Take 1 capsule by mouth daily.   fluticasone 50 MCG/ACT nasal spray Commonly known as:  FLONASE Use one to two sprays in each nostril once daily.   levothyroxine 100 MCG tablet Commonly known as:  SYNTHROID, LEVOTHROID Take 100 mcg by mouth daily before breakfast.   meloxicam 7.5 MG tablet Commonly known as:  MOBIC Take 7.5 mg daily by mouth.   SOLUBLE FIBER/PROBIOTICS PO Take 1 capsule by mouth daily. 20 billion   tamoxifen 20 MG tablet Commonly known as:  NOLVADEX TAKE 1 TABLET DAILY (START FEBRUARY 1ST)   TURMERIC CURCUMIN PO Take 1,000 mg by mouth daily.       Past Medical History:  Diagnosis Date  . Allergy   . Asthma   . 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 GERD (gastroesophageal reflux disease)   . H/O bilateral inguinal hernia repair 1992  . Hypercholesteremia   . Migraines   .  Personal history of radiation therapy   . Plantar fasciitis, bilateral   . Radiation 01/12/15-02/04/15   right breast 42.17 gray  . Spinal stenosis   . Thyroid disease     Past Surgical History:  Procedure Laterality Date  . BLADDER SUSPENSION    . BLEPHAROPLASTY Bilateral   . BREAST LUMPECTOMY Right 12/10/2014  . BREAST LUMPECTOMY WITH RADIOACTIVE SEED LOCALIZATION Right 12/10/2014   Procedure: RIGHT BREAST LUMPECTOMY WITH RADIOACTIVE SEED LOCALIZATION;  Surgeon: Autumn Messing III, MD;  Location: [facility];  Service: General;  Laterality: Right;  . FOOT SURGERY     plantar fashiectomies from both feet  .  HERNIA REPAIR    . NASAL SEPTUM SURGERY    . neuromas removed from right foot    . 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 removal of basal cell carcinoma (nose)    . TONSILECTOMY/ADENOIDECTOMY WITH MYRINGOTOMY    . TONSILLECTOMY      Review of systems negative except as noted in HPI / PMHx or noted below:  Review of Systems  Constitutional: Negative.   HENT: Negative.   Eyes: Negative.   Respiratory: Negative.   Cardiovascular: Negative.   Gastrointestinal: Negative.   Genitourinary: Negative.   Musculoskeletal: Negative.   Skin: Negative.   Neurological: Negative.   Endo/Heme/Allergies: Negative.   Psychiatric/Behavioral: Negative.      Objective:   Vitals:   03/14/17 1518  BP: 110/70  Pulse: 88  Resp: 20          Physical Exam  Constitutional: She is well-developed, well-nourished, and in no distress.  Slight cough  HENT:  Head: Normocephalic.  Right Ear: Tympanic membrane, external ear and ear canal normal.  Left Ear: Tympanic membrane, external ear and ear canal normal.  Nose: Nose normal. No mucosal edema or rhinorrhea.  Mouth/Throat: Uvula is midline, oropharynx is clear and moist and mucous membranes are normal. No oropharyngeal exudate.  Eyes: Conjunctivae are normal.  Neck: Trachea normal. No tracheal tenderness present. No tracheal deviation present. No thyromegaly present.  Cardiovascular: Normal rate, regular rhythm, S1 normal, S2 normal and normal heart sounds.  No murmur heard. Pulmonary/Chest: No stridor. No respiratory distress. She has wheezes (Bilateral expiratory wheezes posterior lung field). She has no rales.  Musculoskeletal: She exhibits no edema.  Lymphadenopathy:       Head (right side): No tonsillar adenopathy present.       Head (left side): No tonsillar adenopathy present.    She has no cervical adenopathy.  Neurological: She is alert. Gait normal.  Skin: No rash noted. She is not diaphoretic. No erythema. Nails show no  clubbing.  Psychiatric: Mood and affect normal.    Diagnostics:    Spirometry was performed and demonstrated an FEV1 of 2.10 at 91 % of predicted.  The patient had an Asthma Control Test with the following results: ACT Total Score: 11.    Assessment and Plan:   1. Asthma, not well controlled, mild persistent, with acute exacerbation   2. Other allergic rhinitis   3. LPRD (laryngopharyngeal reflux disease)   4. Post-influenza syndrome     1. Continue to treat reflux:   A. Minimize chocolate consumption   B.  Protonix 20 mg in a.m. mg in a.m.   2. Continue nasal fluticasone 1-2 sprays each nostril 1-7 times per week depending on disease activity  3. Continue the following if needed:   A. Zyrtec 10 mg one tablet once a day  B.  Mucinex  C. nasal saline  D. Ventolin HFA 2 puffs every 4-6 hours  4.  For this recent post influenza episode utilize the following:   A.  Depo-Medrol 80 IM delivered in clinic today  B.  Asmanex 200 HFA 2 puffs twice a day with spacer  C.  Increase Protonix to twice a day  5. Return to clinic in 6 months or earlier if problem  Katrice was doing well until she contracted influenza and now she has a post influenza syndrome with significant inflammation of her airway and I am going to have her utilize the plan noted above to address this post influenza episode.  She will also continue to utilize anti-inflammatory medications and therapy for reflux on a regular basis which has been working great for over a year up until this viral event.  I will see her back in this clinic in 6 months or earlier if there is a problem.  Allena Katz, MD Allergy / Immunology [place]

## 2017-03-14 NOTE — Patient Instructions (Addendum)
  1. Continue to treat reflux:   A. Minimize chocolate consumption   B.  Protonix 20 mg in a.m. mg in a.m.   2. Continue nasal fluticasone 1-2 sprays each nostril 1-7 times per week depending on disease activity  3. Continue the following if needed:   A. Zyrtec 10 mg one tablet once a day  B. Mucinex  C. nasal saline  D. Ventolin HFA 2 puffs every 4-6 hours  4.  For this recent post influenza episode utilize the following:   A.  Depo-Medrol 80 IM delivered in clinic today  B.  Asmanex 200 HFA 2 puffs twice a day with spacer  C.  Increase Protonix to twice a day  5. Return to clinic in 6 months or earlier if problem

## 2017-03-14 NOTE — Telephone Encounter (Signed)
I tried to reach the patient by phone, left message on voicemail.

## 2017-03-15 ENCOUNTER — Encounter: Payer: Self-pay | Admitting: Allergy and Immunology

## 2017-03-16 NOTE — Progress Notes (Signed)
Patient and husband stopped by the office today to ask a question about her report from recent neuropsychological evaluation she completed. They noted that on the printout it lists referring doctor as her PCP, not Dr. Rexene Alberts. I explained that by default, in [place], it lists PCP as referring physician for my appointments; it is not something I input or can change. However, in the text of the report I indicate that the referring doctor is Dr. Rexene Alberts. They understood and were appreciative of me addressing their question.

## 2017-03-17 ENCOUNTER — Ambulatory Visit: Payer: 59 | Admitting: Physical Therapy

## 2017-03-21 ENCOUNTER — Ambulatory Visit: Payer: 59 | Admitting: Physical Therapy

## 2017-03-21 DIAGNOSIS — R252 Cramp and spasm: Secondary | ICD-10-CM

## 2017-03-21 DIAGNOSIS — M542 Cervicalgia: Secondary | ICD-10-CM | POA: Diagnosis not present

## 2017-03-21 DIAGNOSIS — F0781 Postconcussional syndrome: Secondary | ICD-10-CM

## 2017-03-21 DIAGNOSIS — M6281 Muscle weakness (generalized): Secondary | ICD-10-CM

## 2017-03-21 NOTE — Therapy (Signed)
[place] [address] [place], [place], [address] Phone: [phone]   Fax:  [phone]  Physical Therapy Treatment  Patient Details  Name: Valerie Snyder MRN: [id number] Date of Birth: 1950-05-22 Referring Provider: Patrice Paradise   Encounter Date: 03/21/2017  PT End of Session - 03/21/17 1559    Visit Number  2    Number of Visits  12    Date for PT Re-Evaluation  04/18/17    PT Start Time  1520    PT Stop Time  1600    PT Time Calculation (min)  40 min       Past Medical History:  Diagnosis Date  . Allergy   . Asthma   . Breast cancer of 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 breast ([place]) 11/13/2014  . GERD (gastroesophageal reflux disease)   . H/O bilateral inguinal hernia repair 1992  . Hypercholesteremia   . Migraines   . Personal history of radiation therapy   . Plantar fasciitis, bilateral   . Radiation 01/12/15-02/04/15   right breast 42.17 gray  . Spinal stenosis   . Thyroid disease     Past Surgical History:  Procedure Laterality Date  . BLADDER SUSPENSION    . BLEPHAROPLASTY Bilateral   . BREAST LUMPECTOMY Right 12/10/2014  . BREAST LUMPECTOMY WITH RADIOACTIVE SEED LOCALIZATION Right 12/10/2014   Procedure: RIGHT BREAST LUMPECTOMY WITH RADIOACTIVE SEED LOCALIZATION;  Surgeon: Autumn Messing III, MD;  Location: [facility];  Service: General;  Laterality: Right;  . FOOT SURGERY     plantar fashiectomies from both feet  . HERNIA REPAIR    . NASAL SEPTUM SURGERY    . neuromas removed from right foot    . 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 removal of basal cell carcinoma (nose)    . TONSILECTOMY/ADENOIDECTOMY WITH MYRINGOTOMY    . TONSILLECTOMY      There were no vitals filed for this visit.  Subjective Assessment - 03/21/17 1527    Subjective  threw my back out is why I was not here last week- better now. HA come and go    Currently in Pain?  No/denies                       [facility] Adult PT Treatment/Exercise - 03/21/17 0001      Neck Exercises: Machines for Strengthening   UBE (Upper Arm Bike)  level 2 x 2 fwd/2 back HR 90    Nustep  level 5 x  minutes  HR after this was 98 bpm    Cybex Row  15# 2 sets 15    Lat Pull  15# 2 sets 15    Other Machines for Strengthening  bike x 5 minutes level 1    Other Machines for Strengthening  knee ext 10# 2 set s10               PT Short Term Goals - 03/07/17 1725      PT SHORT TERM GOAL #1   Title  independent with initial HEP for flexibility exercise     Time  2    Period  Weeks    Status  New        PT Long Term Goals - 03/07/17 1726      PT LONG TERM GOAL #1   Title  patient will return to driving    Time  12  Period  Weeks    Status  New      PT LONG TERM GOAL #2   Title  patient will return to exercising at the gym    Time  12    Period  Weeks    Status  New      PT LONG TERM GOAL #3   Title  decrease HA frequency by 50%    Time  12    Period  Weeks    Status  New      PT LONG TERM GOAL #4   Title  tolerate 45 minutes of activity without rest    Time  12    Period  Weeks    Status  New      PT LONG TERM GOAL #5   Title  decrease pain 50%    Time  8    Period  Weeks    Status  New            Plan - 03/21/17 1600    Clinical Impression Statement  pt tolerated all actvitity without HA or increased pain. HR stayed 90-98 throughout session. Pt mentioned HA after last sesssion but she felt it was stress so asked pt to be aware of how she feels tonight an din th emorning to report to [place].    PT Treatment/Interventions  ADLs/Self Care Home Management;Cryotherapy;Electrical Stimulation;Moist Heat;Neuromuscular re-education;Balance training;Therapeutic exercise;Therapeutic activities;Functional mobility training;Patient/family education;Manual techniques    PT Next Visit Plan  add exercises always monitoring pain, HA, dizziness       Patient  will benefit from skilled therapeutic intervention in order to improve the following deficits and impairments:  Decreased range of motion, Cardiopulmonary status limiting activity, Decreased endurance, Dizziness, Increased muscle spasms, Decreased activity tolerance, Pain, Decreased balance, Impaired flexibility, Improper body mechanics, Postural dysfunction, Decreased strength  Visit Diagnosis: Cervicalgia  Cramp and spasm  Post concussion syndrome  Muscle weakness (generalized)     Problem List Patient Active Problem List   Diagnosis Date Noted  . Genetic testing 12/10/2014  . Breast cancer of 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 breast ([place]) 11/13/2014  . Allergic rhinoconjunctivitis 10/22/2014  . Asthma 10/22/2014    Valerie Snyder,ANGIE PTA 03/21/2017, 4:01 PM  [address] [place], [place], [address] Phone: [phone]   Fax:  [phone]  Name: Valerie Snyder MRN: [id number] Date of Birth: January 30, 1951

## 2017-03-23 ENCOUNTER — Encounter: Payer: Self-pay | Admitting: Physical Therapy

## 2017-03-23 ENCOUNTER — Ambulatory Visit: Payer: 59 | Admitting: Physical Therapy

## 2017-03-23 DIAGNOSIS — M542 Cervicalgia: Secondary | ICD-10-CM

## 2017-03-23 DIAGNOSIS — R252 Cramp and spasm: Secondary | ICD-10-CM

## 2017-03-23 DIAGNOSIS — F0781 Postconcussional syndrome: Secondary | ICD-10-CM

## 2017-03-23 DIAGNOSIS — M6281 Muscle weakness (generalized): Secondary | ICD-10-CM

## 2017-03-23 NOTE — Therapy (Signed)
[place] [address] [place], [place], [address] Phone: [phone]   Fax:  [phone]  Physical Therapy Treatment  Patient Details  Name: Valerie Snyder MRN: [id number] Date of Birth: 1950/09/23 Referring Provider: Patrice Paradise   Encounter Date: 03/23/2017  PT End of Session - 03/23/17 1739    Visit Number  3    Date for PT Re-Evaluation  04/18/17    PT Start Time  1645    PT Stop Time  1740    PT Time Calculation (min)  55 min    Activity Tolerance  Patient tolerated treatment well    Behavior During Therapy  [facility] for tasks assessed/performed       Past Medical History:  Diagnosis Date  . Allergy   . Asthma   . Breast cancer of 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 breast ([place]) 11/13/2014  . GERD (gastroesophageal reflux disease)   . H/O bilateral inguinal hernia repair 1992  . Hypercholesteremia   . Migraines   . Personal history of radiation therapy   . Plantar fasciitis, bilateral   . Radiation 01/12/15-02/04/15   right breast 42.17 gray  . Spinal stenosis   . Thyroid disease     Past Surgical History:  Procedure Laterality Date  . BLADDER SUSPENSION    . BLEPHAROPLASTY Bilateral   . BREAST LUMPECTOMY Right 12/10/2014  . BREAST LUMPECTOMY WITH RADIOACTIVE SEED LOCALIZATION Right 12/10/2014   Procedure: RIGHT BREAST LUMPECTOMY WITH RADIOACTIVE SEED LOCALIZATION;  Surgeon: Autumn Messing III, MD;  Location: [facility];  Service: General;  Laterality: Right;  . FOOT SURGERY     plantar fashiectomies from both feet  . HERNIA REPAIR    . NASAL SEPTUM SURGERY    . neuromas removed from right foot    . 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 removal of basal cell carcinoma (nose)    . TONSILECTOMY/ADENOIDECTOMY WITH MYRINGOTOMY    . TONSILLECTOMY      There were no vitals filed for this visit.  Subjective Assessment - 03/23/17 1648    Subjective  Patient reports that she did well  after the lsat session, no c/o HA, dizziness etc..  "I think we can try more"    Currently in Pain?  No/denies                      [facility] Adult PT Treatment/Exercise - 03/23/17 0001      High Level Balance   High Level Balance Activities  Side stepping;Backward walking;Tandem walking    High Level Balance Comments  toe walking and heel walking      Neck Exercises: Machines for Strengthening   UBE (Upper Arm Bike)  Constant work 25 watts x 5 minutes    Nustep  level 5 x 7 minutes    [employer] for Upper Extremity Motion  Elliptical R=5, I=10 x 3 minutes, HR up to 145 after this    Cybex Row  15# 2 sets 15    Lat Pull  15# 2 sets 15    Other Machines for Strengthening  bike x 5 minutes level 2    Other Machines for Strengthening  knee ext 10# 2 set s10, leg curls 20# 2x10      Manual Therapy   Manual Therapy  Manual Traction    Manual therapy comments  gentle PROM of the cervical spine, some shoulder depression manually    Manual Traction  gentle occipital release               PT Short Term Goals - 03/23/17 1742      PT SHORT TERM GOAL #1   Title  independent with initial HEP for flexibility exercise     Status  On-going        PT Long Term Goals - 03/07/17 1726      PT LONG TERM GOAL #1   Title  patient will return to driving    Time  12    Period  Weeks    Status  New      PT LONG TERM GOAL #2   Title  patient will return to exercising at the gym    Time  12    Period  Weeks    Status  New      PT LONG TERM GOAL #3   Title  decrease HA frequency by 50%    Time  12    Period  Weeks    Status  New      PT LONG TERM GOAL #4   Title  tolerate 45 minutes of activity without rest    Time  12    Period  Weeks    Status  New      PT LONG TERM GOAL #5   Title  decrease pain 50%    Time  8    Period  Weeks    Status  New            Plan - 03/23/17 1739    Clinical Impression Statement  Increased some activity HR was up to 145  after the elliptical, quickly recovered, no HA, dizziness or pain was mentioned by her during the treatment, I added some balance activties today, she tends to mover her body and not the neck, she may have some low motor visual issues going on, at the end of the exercises did some gentle distraction and cervical motions    PT Next Visit Plan  see how she tolerated todays increased activities, may add balance and occulomotor exercises    Consulted and Agree with Plan of Care  Patient       Patient will benefit from skilled therapeutic intervention in order to improve the following deficits and impairments:  Decreased range of motion, Cardiopulmonary status limiting activity, Decreased endurance, Dizziness, Increased muscle spasms, Decreased activity tolerance, Pain, Decreased balance, Impaired flexibility, Improper body mechanics, Postural dysfunction, Decreased strength  Visit Diagnosis: Cervicalgia  Cramp and spasm  Post concussion syndrome  Muscle weakness (generalized)     Problem List Patient Active Problem List   Diagnosis Date Noted  . Genetic testing 12/10/2014  . Breast cancer of 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 breast ([place]) 11/13/2014  . Allergic rhinoconjunctivitis 10/22/2014  . Asthma 10/22/2014    Valerie Snyder., PT 03/23/2017, 5:43 PM  [place] [address] [place], [place], [address] Phone: [phone]   Fax:  [phone]  Name: Valerie Snyder MRN: [id number] Date of Birth: 1950/07/19

## 2017-03-28 ENCOUNTER — Ambulatory Visit: Payer: 59 | Admitting: Physical Therapy

## 2017-03-28 DIAGNOSIS — M6281 Muscle weakness (generalized): Secondary | ICD-10-CM

## 2017-03-28 DIAGNOSIS — M542 Cervicalgia: Secondary | ICD-10-CM

## 2017-03-28 DIAGNOSIS — F0781 Postconcussional syndrome: Secondary | ICD-10-CM

## 2017-03-28 DIAGNOSIS — R252 Cramp and spasm: Secondary | ICD-10-CM

## 2017-03-28 NOTE — Therapy (Signed)
[address] [place], [place], [address] Phone: [phone]   Fax:  [phone]  Physical Therapy Treatment  Patient Details  Name: Valerie Snyder MRN: [id number] Date of Birth: 1950/09/21 Referring Provider: Patrice Paradise   Encounter Date: 03/28/2017  PT End of Session - 03/28/17 0951    Visit Number  4    Number of Visits  12    Date for PT Re-Evaluation  04/18/17    PT Start Time  0900    PT Stop Time  1000    PT Time Calculation (min)  60 min       Past Medical History:  Diagnosis Date  . Allergy   . Asthma   . Breast cancer of 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 breast ([place]) 11/13/2014  . GERD (gastroesophageal reflux disease)   . H/O bilateral inguinal hernia repair 1992  . Hypercholesteremia   . Migraines   . Personal history of radiation therapy   . Plantar fasciitis, bilateral   . Radiation 01/12/15-02/04/15   right breast 42.17 gray  . Spinal stenosis   . Thyroid disease     Past Surgical History:  Procedure Laterality Date  . BLADDER SUSPENSION    . BLEPHAROPLASTY Bilateral   . BREAST LUMPECTOMY Right 12/10/2014  . BREAST LUMPECTOMY WITH RADIOACTIVE SEED LOCALIZATION Right 12/10/2014   Procedure: RIGHT BREAST LUMPECTOMY WITH RADIOACTIVE SEED LOCALIZATION;  Surgeon: Autumn Messing III, MD;  Location: [facility];  Service: General;  Laterality: Right;  . FOOT SURGERY     plantar fashiectomies from both feet  . HERNIA REPAIR    . NASAL SEPTUM SURGERY    . neuromas removed from right foot    . 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 removal of basal cell carcinoma (nose)    . TONSILECTOMY/ADENOIDECTOMY WITH MYRINGOTOMY    . TONSILLECTOMY      There were no vitals filed for this visit.  Subjective Assessment - 03/28/17 0856    Subjective  no HA or dizziness after last session just a "good" tired    Currently in Pain?  No/denies                       [facility] Adult PT Treatment/Exercise - 03/28/17 0001      Neck Exercises: Machines for Strengthening   UBE (Upper Arm Bike)  Constant work 25 watts 3 fwd/3 back HR after 113    Nustep  level 5 x 7 minutes HR after 108    Cybex Row  15# 2 sets 15    Lat Pull  15# 2 sets 15    Other Machines for Strengthening  Bike 6 min L 2    Other Machines for Strengthening  knee ext 10# 3 sets 10, leg curls 20# 3x10 leg press 30 # 2 sets 10      Neck Exercises: Standing   Other Standing Exercises  ball vs wall 5 CC and CCW      Neck Exercises: Supine   Neck Retraction  10 reps;3 secs on ball    Shoulder Flexion  Both;10 reps;Weights head on ball in retraction    Shoulder Flexion Weights (lbs)  2    Shoulder ABduction  Both;10 reps;Weights head on ball cerv retraction    Shoulder Abduction Weights (lbs)  2    Upper Extremity D1  10 reps;Weights head on ball    UE D1 Weights (lbs)  2    Other Supine Exercise  2# chest press with cerv retraction      Manual Therapy   Manual Therapy  Manual Traction    Manual therapy comments  gentle PROM of the cervical spine, some shoulder depression manually    Manual Traction  gentle occipital release               PT Short Term Goals - 03/23/17 1742      PT SHORT TERM GOAL #1   Title  independent with initial HEP for flexibility exercise     Status  On-going        PT Long Term Goals - 03/07/17 1726      PT LONG TERM GOAL #1   Title  patient will return to driving    Time  12    Period  Weeks    Status  New      PT LONG TERM GOAL #2   Title  patient will return to exercising at the gym    Time  12    Period  Weeks    Status  New      PT LONG TERM GOAL #3   Title  decrease HA frequency by 50%    Time  12    Period  Weeks    Status  New      PT LONG TERM GOAL #4   Title  tolerate 45 minutes of activity without rest    Time  12    Period  Weeks    Status  New      PT LONG TERM GOAL #5   Title   decrease pain 50%    Time  8    Period  Weeks    Status  New            Plan - 03/28/17 1950    Clinical Impression Statement  no c/o HA and HR maintained. some dizzines upon sitting after MT and stretches. tolerated all interventions well    PT Treatment/Interventions  ADLs/Self Care Home Management;Cryotherapy;Electrical Stimulation;Moist Heat;Neuromuscular re-education;Balance training;Therapeutic exercise;Therapeutic activities;Functional mobility training;Patient/family education;Manual techniques    PT Next Visit Plan   increase activities, may add balance and occulomotor exercises       Patient will benefit from skilled therapeutic intervention in order to improve the following deficits and impairments:  Decreased range of motion, Cardiopulmonary status limiting activity, Decreased endurance, Dizziness, Increased muscle spasms, Decreased activity tolerance, Pain, Decreased balance, Impaired flexibility, Improper body mechanics, Postural dysfunction, Decreased strength  Visit Diagnosis: Cervicalgia  Cramp and spasm  Post concussion syndrome  Muscle weakness (generalized)     Problem List Patient Active Problem List   Diagnosis Date Noted  . Genetic testing 12/10/2014  . Breast cancer of 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 breast ([place]) 11/13/2014  . Allergic rhinoconjunctivitis 10/22/2014  . Asthma 10/22/2014    Demonta Wombles,ANGIE PTA 03/28/2017, 10:00 AM  [address] [place] [place], [place], [address] Phone: [phone]   Fax:  [phone]  Name: Valerie Snyder MRN: [id number] Date of Birth: 07/09/1950

## 2017-03-30 ENCOUNTER — Ambulatory Visit: Payer: 59 | Admitting: Physical Therapy

## 2017-03-30 DIAGNOSIS — M6281 Muscle weakness (generalized): Secondary | ICD-10-CM

## 2017-03-30 DIAGNOSIS — F0781 Postconcussional syndrome: Secondary | ICD-10-CM

## 2017-03-30 DIAGNOSIS — R252 Cramp and spasm: Secondary | ICD-10-CM

## 2017-03-30 DIAGNOSIS — M542 Cervicalgia: Secondary | ICD-10-CM | POA: Diagnosis not present

## 2017-03-30 NOTE — Therapy (Signed)
[facility] [address] [place], [place], [address] Phone: [phone]   Fax:  [phone]  Physical Therapy Treatment  Patient Details  Name: Valerie Snyder MRN: [id number] Date of Birth: 01/01/1951 Referring Provider: Patrice Paradise   Encounter Date: 03/30/2017  PT End of Session - 03/30/17 1553    Visit Number  5    Date for PT Re-Evaluation  04/18/17    PT Start Time  5465    PT Stop Time  1555    PT Time Calculation (min)  63 min       Past Medical History:  Diagnosis Date  . Allergy   . Asthma   . Breast cancer of 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 breast ([place]) 11/13/2014  . GERD (gastroesophageal reflux disease)   . H/O bilateral inguinal hernia repair 1992  . Hypercholesteremia   . Migraines   . Personal history of radiation therapy   . Plantar fasciitis, bilateral   . Radiation 01/12/15-02/04/15   right breast 42.17 gray  . Spinal stenosis   . Thyroid disease     Past Surgical History:  Procedure Laterality Date  . BLADDER SUSPENSION    . BLEPHAROPLASTY Bilateral   . BREAST LUMPECTOMY Right 12/10/2014  . BREAST LUMPECTOMY WITH RADIOACTIVE SEED LOCALIZATION Right 12/10/2014   Procedure: RIGHT BREAST LUMPECTOMY WITH RADIOACTIVE SEED LOCALIZATION;  Surgeon: Autumn Messing III, MD;  Location: [facility];  Service: General;  Laterality: Right;  . FOOT SURGERY     plantar fashiectomies from both feet  . HERNIA REPAIR    . NASAL SEPTUM SURGERY    . neuromas removed from right foot    . 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 removal of basal cell carcinoma (nose)    . TONSILECTOMY/ADENOIDECTOMY WITH MYRINGOTOMY    . TONSILLECTOMY      There were no vitals filed for this visit.  Subjective Assessment - 03/30/17 1450    Subjective  very tired after last session but I also did alot of cleaning    Currently in Pain?  No/denies                      [facility] Adult PT  Treatment/Exercise - 03/30/17 0001      High Level Balance   High Level Balance Comments  ball toss dynamic surface,moving and head turns      Neck Exercises: Machines for Strengthening   UBE (Upper Arm Bike)  Constant work 25 watts 3 fwd/3 back    Nustep  level 5 x 7 minutes LE only HR 110    Cybex Row  20# 2 sets 15    Lat Pull  20# 2 sets 15    Other Machines for Strengthening  Elliptical 44fd/3back I 6 R 4 HR 130    Other Machines for Strengthening  knee ext 10# 3 sets 10, leg curls 20# 3x10      Manual Therapy   Manual Therapy  Manual Traction    Manual therapy comments  gentle PROM of the cervical spine, some shoulder depression manually    Manual Traction  gentle occipital release             PT Education - 03/30/17 1533    Education provided  Yes    Education Details  vestibular occular ex    Person(s) Educated  Patient    Methods  Explanation;Demonstration;Handout    Comprehension  Verbalized understanding;Returned  demonstration       PT Short Term Goals - 03/23/17 1742      PT SHORT TERM GOAL #1   Title  independent with initial HEP for flexibility exercise     Status  On-going        PT Long Term Goals - 03/30/17 1554      PT LONG TERM GOAL #1   Title  patient will return to driving    Status  On-going      PT LONG TERM GOAL #2   Title  patient will return to exercising at the gym    Status  On-going      PT LONG TERM GOAL #3   Title  decrease HA frequency by 50%    Status  Partially Met      PT LONG TERM GOAL #4   Title  tolerate 45 minutes of activity without rest    Status  Partially Met      PT LONG TERM GOAL #5   Title  decrease pain 50%    Status  Partially Met            Plan - 03/30/17 1554    Clinical Impression Statement  ptogressing with LTGs. Vest Occular ex made pt dizzy and nauseaed esp in horz plane as well as moving with ball toss increased dizzines- issued vert occ for HEP.    PT Next Visit Plan  assess vest occ  HEP and progress       Patient will benefit from skilled therapeutic intervention in order to improve the following deficits and impairments:  Decreased range of motion, Cardiopulmonary status limiting activity, Decreased endurance, Dizziness, Increased muscle spasms, Decreased activity tolerance, Pain, Decreased balance, Impaired flexibility, Improper body mechanics, Postural dysfunction, Decreased strength  Visit Diagnosis: Cervicalgia  Cramp and spasm  Post concussion syndrome  Muscle weakness (generalized)     Problem List Patient Active Problem List   Diagnosis Date Noted  . Genetic testing 12/10/2014  . Breast cancer of 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 breast ([place]) 11/13/2014  . Allergic rhinoconjunctivitis 10/22/2014  . Asthma 10/22/2014    Modena Bellemare,ANGIE PTA 03/30/2017, 3:56 PM  [facility] [address] [place], [place], [address] Phone: [phone]   Fax:  [phone]  Name: Valerie Snyder MRN: [id number] Date of Birth: 1950/08/14

## 2017-04-04 ENCOUNTER — Ambulatory Visit: Payer: 59 | Attending: Orthopaedic Surgery | Admitting: Physical Therapy

## 2017-04-04 ENCOUNTER — Inpatient Hospital Stay: Payer: 59 | Attending: Hematology and Oncology | Admitting: Hematology and Oncology

## 2017-04-04 ENCOUNTER — Telehealth: Payer: Self-pay | Admitting: Hematology and Oncology

## 2017-04-04 DIAGNOSIS — Z170 Estrogen receptor positive status [ER+]: Secondary | ICD-10-CM | POA: Diagnosis not present

## 2017-04-04 DIAGNOSIS — Z923 Personal history of irradiation: Secondary | ICD-10-CM

## 2017-04-04 DIAGNOSIS — F0781 Postconcussional syndrome: Secondary | ICD-10-CM | POA: Insufficient documentation

## 2017-04-04 DIAGNOSIS — Z79810 Long term (current) use of selective estrogen receptor modulators (SERMs): Secondary | ICD-10-CM | POA: Insufficient documentation

## 2017-04-04 DIAGNOSIS — Z79899 Other long term (current) drug therapy: Secondary | ICD-10-CM | POA: Diagnosis not present

## 2017-04-04 DIAGNOSIS — R252 Cramp and spasm: Secondary | ICD-10-CM

## 2017-04-04 DIAGNOSIS — R232 Flushing: Secondary | ICD-10-CM

## 2017-04-04 DIAGNOSIS — M6281 Muscle weakness (generalized): Secondary | ICD-10-CM | POA: Diagnosis present

## 2017-04-04 DIAGNOSIS — M542 Cervicalgia: Secondary | ICD-10-CM

## 2017-04-04 DIAGNOSIS — C50211 Malignant neoplasm of 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 breast: Secondary | ICD-10-CM

## 2017-04-04 MED ORDER — TAMOXIFEN CITRATE 20 MG PO TABS: ORAL_TABLET | ORAL | 4 refills | Status: DC

## 2017-04-04 NOTE — Therapy (Signed)
[facility] [facility] [address] [place], [place], [address] Phone: [phone]   Fax:  [phone]  Physical Therapy Treatment  Patient Details  Name: Valerie Snyder MRN: [id number] Date of Birth: [birth year] Referring Provider: Patrice Paradise   Encounter Date: 04/04/2017  PT End of Session - 04/04/17 0946    Visit Number  6    Number of Visits  12    Date for PT Re-Evaluation  04/18/17    PT Start Time  0852    PT Stop Time  0945    PT Time Calculation (min)  53 min       Past Medical History:  Diagnosis Date  . Allergy   . Asthma   . Breast cancer of 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 breast ([place]) 11/13/2014  . GERD (gastroesophageal reflux disease)   . H/O bilateral inguinal hernia repair 1992  . Hypercholesteremia   . Migraines   . Personal history of radiation therapy   . Plantar fasciitis, bilateral   . Radiation 01/12/15-02/04/15   right breast 42.17 gray  . Spinal stenosis   . Thyroid disease     Past Surgical History:  Procedure Laterality Date  . BLADDER SUSPENSION    . BLEPHAROPLASTY Bilateral   . BREAST LUMPECTOMY Right 12/10/2014  . BREAST LUMPECTOMY WITH RADIOACTIVE SEED LOCALIZATION Right 12/10/2014   Procedure: RIGHT BREAST LUMPECTOMY WITH RADIOACTIVE SEED LOCALIZATION;  Surgeon: Autumn Messing III, MD;  Location: [facility];  Service: General;  Laterality: Right;  . FOOT SURGERY     plantar fashiectomies from both feet  . HERNIA REPAIR    . NASAL SEPTUM SURGERY    . neuromas removed from right foot    . 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 removal of basal cell carcinoma (nose)    . TONSILECTOMY/ADENOIDECTOMY WITH MYRINGOTOMY    . TONSILLECTOMY      There were no vitals filed for this visit.  Subjective Assessment - 04/04/17 0856    Subjective  started beginner kickball last night,fell and hurt RT knee but other than that did well. Dizzy 2 hours after last session but doing  ex 3x day and can tell they are helping-less dizzy each time    Currently in Pain?  No/denies                      [facility] Adult PT Treatment/Exercise - 04/04/17 0001      Neck Exercises: Machines for Strengthening   UBE (Upper Arm Bike)  Constant work 25 watts 3 fwd/3 back    Nustep  level 5 x 8 minutes      Neck Exercises: Standing   Neck Retraction  15 reps head on ball on wall ( verb and tatcile cuing needed)    Other Standing Exercises  3# UE circut 10x 2 sets    Other Standing Exercises  mod dead lift with UE row 3# 2 sets 10 ball vs wall 5x CC and CCW      Neck Exercises: Seated   Other Seated Exercise  STS wt ball 2 sets 10      Neck Exercises: Supine   Neck Retraction  15 reps;3 secs on ball      Manual Therapy   Manual Therapy  Manual Traction    Manual therapy comments  gentle PROM of the cervical spine, some shoulder depression manually    Manual Traction  gentle occipital release  PT Short Term Goals - 03/23/17 1742      PT SHORT TERM GOAL #1   Title  independent with initial HEP for flexibility exercise     Status  On-going        PT Long Term Goals - 03/30/17 1554      PT LONG TERM GOAL #1   Title  patient will return to driving    Status  On-going      PT LONG TERM GOAL #2   Title  patient will return to exercising at the gym    Status  On-going      PT LONG TERM GOAL #3   Title  decrease HA frequency by 50%    Status  Partially Met      PT LONG TERM GOAL #4   Title  tolerate 45 minutes of activity without rest    Status  Partially Met      PT LONG TERM GOAL #5   Title  decrease pain 50%    Status  Partially Met            Plan - 04/04/17 0946    Clinical Impression Statement  decreased activity with LE d/t fall last night and swollen and bruised RT knee ( ice 10 min after) tolerated all ther ex well. cuing needed for cerv posture with ex. pt to cont with VOR at home for dizziness     PT  Treatment/Interventions  ADLs/Self Care Home Management;Cryotherapy;Electrical Stimulation;Moist Heat;Neuromuscular re-education;Balance training;Therapeutic exercise;Therapeutic activities;Functional mobility training;Patient/family education;Manual techniques    PT Next Visit Plan  assess and progress       Patient will benefit from skilled therapeutic intervention in order to improve the following deficits and impairments:  Decreased range of motion, Cardiopulmonary status limiting activity, Decreased endurance, Dizziness, Increased muscle spasms, Decreased activity tolerance, Pain, Decreased balance, Impaired flexibility, Improper body mechanics, Postural dysfunction, Decreased strength  Visit Diagnosis: Cervicalgia  Cramp and spasm  Post concussion syndrome  Muscle weakness (generalized)     Problem List Patient Active Problem List   Diagnosis Date Noted  . Genetic testing 12/10/2014  . Breast cancer of 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 breast ([place]) 11/13/2014  . Allergic rhinoconjunctivitis 10/22/2014  . Asthma 10/22/2014    Crue Otero,ANGIE PTA 04/04/2017, 9:49 AM  [address] [address] [place], [place], [address] Phone: [phone]   Fax:  [phone]  Name: Deeanna Beightol MRN: [id number] Date of Birth: 13-Apr-1950

## 2017-04-04 NOTE — Telephone Encounter (Signed)
Gave avs and calendar for march 2020 °
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
## 2017-04-06 ENCOUNTER — Telehealth: Payer: Self-pay

## 2017-04-06 ENCOUNTER — Encounter: Payer: Self-pay | Admitting: Physical Therapy

## 2017-04-06 ENCOUNTER — Ambulatory Visit: Payer: 59 | Admitting: Physical Therapy

## 2017-04-06 DIAGNOSIS — F0781 Postconcussional syndrome: Secondary | ICD-10-CM

## 2017-04-06 DIAGNOSIS — R252 Cramp and spasm: Secondary | ICD-10-CM

## 2017-04-06 DIAGNOSIS — M6281 Muscle weakness (generalized): Secondary | ICD-10-CM

## 2017-04-06 DIAGNOSIS — M542 Cervicalgia: Secondary | ICD-10-CM

## 2017-04-06 NOTE — Telephone Encounter (Signed)
Returned pt call regarding VM left stating she was experiencing vaginal spotting. Discharge was dark blood and having to wear a light pad. Per Dr. Lindi Adie he wants her to stop taking the tamoxifen and to see her gynecologist. Left VM for pt with these instructions.  Cyndia Bent RN

## 2017-04-06 NOTE — Therapy (Signed)
[place], [place], [address] Phone: [phone]   Fax:  [phone]  Physical Therapy Treatment  Patient Details  Name: Valerie Snyder MRN: [id number] Date of Birth: October 29, 1950 Referring Provider: Patrice Paradise   Encounter Date: 04/06/2017  PT End of Session - 04/06/17 1348    Visit Number  7    Date for PT Re-Evaluation  04/18/17    PT Start Time  1300    PT Stop Time  1345    PT Time Calculation (min)  45 min    Behavior During Therapy  [facility] for tasks assessed/performed       Past Medical History:  Diagnosis Date  . Allergy   . Asthma   . Breast cancer of 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 breast ([place]) 11/13/2014  . GERD (gastroesophageal reflux disease)   . H/O bilateral inguinal hernia repair 1992  . Hypercholesteremia   . Migraines   . Personal history of radiation therapy   . Plantar fasciitis, bilateral   . Radiation 01/12/15-02/04/15   right breast 42.17 gray  . Spinal stenosis   . Thyroid disease     Past Surgical History:  Procedure Laterality Date  . BLADDER SUSPENSION    . BLEPHAROPLASTY Bilateral   . BREAST LUMPECTOMY Right 12/10/2014  . BREAST LUMPECTOMY WITH RADIOACTIVE SEED LOCALIZATION Right 12/10/2014   Procedure: RIGHT BREAST LUMPECTOMY WITH RADIOACTIVE SEED LOCALIZATION;  Surgeon: Autumn Messing III, MD;  Location: [place];  Service: General;  Laterality: Right;  . FOOT SURGERY     plantar fashiectomies from both feet  . HERNIA REPAIR    . NASAL SEPTUM SURGERY    . neuromas removed from right foot    . 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 removal of basal cell carcinoma (nose)    . TONSILECTOMY/ADENOIDECTOMY WITH MYRINGOTOMY    . TONSILLECTOMY      There were no vitals filed for this visit.  Subjective Assessment - 04/06/17 1301    Subjective  Pt reports that she feels fine just the usual headache    Currently in Pain?  Yes    Pain  Score  2     Pain Location  Head    Pain Orientation  Upper                      [facility] Adult PT Treatment/Exercise - 04/06/17 0001      Neck Exercises: Machines for Strengthening   UBE (Upper Arm Bike)  L4 3 fwd/3 back    Nustep  level 5 x 8 minutes    Cybex Row  20# 2 sets 15    Lat Pull  20# 2 sets 15    Other Machines for Strengthening  knee ext 10# 3 sets 10, leg curls 20# 3x10      Neck Exercises: Standing   Other Standing Exercises  ball vs wall 5CC/CCW    Other Standing Exercises  mos dead 3lb with shrug 2x10       Manual Therapy   Manual Therapy  Manual Traction    Manual therapy comments  gentle PROM of the cervical spine, some shoulder depression manually    Manual Traction  gentle occipital release               PT Short Term Goals - 03/23/17 1742      PT SHORT TERM GOAL #1   Title  independent with initial HEP for flexibility exercise     Status  On-going        PT Long Term Goals - 03/30/17 1554      PT LONG TERM GOAL #1   Title  patient will return to driving    Status  On-going      PT LONG TERM GOAL #2   Title  patient will return to exercising at the gym    Status  On-going      PT LONG TERM GOAL #3   Title  decrease HA frequency by 50%    Status  Partially Met      PT LONG TERM GOAL #4   Title  tolerate 45 minutes of activity without rest    Status  Partially Met      PT LONG TERM GOAL #5   Title  decrease pain 50%    Status  Partially Met            Plan - 04/06/17 1349    Clinical Impression Statement  no issue reported with machine level interventions. Stated she had a headache pre treatment and it had resolved after exercises and MT. Some cervical tightness with R rotation compared to L. Some dizziness reported when transitioning from supine to sit.    Rehab Potential  Fair    PT Frequency  2x / week    PT Duration  6 weeks    PT Treatment/Interventions  ADLs/Self Care Home  Management;Cryotherapy;Electrical Stimulation;Moist Heat;Neuromuscular re-education;Balance training;Therapeutic exercise;Therapeutic activities;Functional mobility training;Patient/family education;Manual techniques    PT Next Visit Plan  assess and progress       Patient will benefit from skilled therapeutic intervention in order to improve the following deficits and impairments:  Decreased range of motion, Cardiopulmonary status limiting activity, Decreased endurance, Dizziness, Increased muscle spasms, Decreased activity tolerance, Pain, Decreased balance, Impaired flexibility, Improper body mechanics, Postural dysfunction, Decreased strength  Visit Diagnosis: Cervicalgia  Cramp and spasm  Post concussion syndrome  Muscle weakness (generalized)     Problem List Patient Active Problem List   Diagnosis Date Noted  . Genetic testing 12/10/2014  . Breast cancer of 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 breast ([place]) 11/13/2014  . Allergic rhinoconjunctivitis 10/22/2014  . Asthma 10/22/2014    Scot Jun, PTA 04/06/2017, 1:51 PM  [place], [place], [address] Phone: [phone]   Fax:  [phone]  Name: Valerie Snyder MRN: [id number] Date of Birth: December 17, 1950

## 2017-04-11 ENCOUNTER — Ambulatory Visit: Payer: 59 | Admitting: Physical Therapy

## 2017-04-11 DIAGNOSIS — M542 Cervicalgia: Secondary | ICD-10-CM

## 2017-04-11 DIAGNOSIS — F0781 Postconcussional syndrome: Secondary | ICD-10-CM

## 2017-04-11 DIAGNOSIS — M6281 Muscle weakness (generalized): Secondary | ICD-10-CM

## 2017-04-11 DIAGNOSIS — R252 Cramp and spasm: Secondary | ICD-10-CM

## 2017-04-11 NOTE — Therapy (Signed)
[address] [place], [place], [address] Phone: [phone]   Fax:  [phone]  Physical Therapy Treatment  Patient Details  Name: Valerie Snyder MRN: [id number] Date of Birth: [birth year] Referring Provider: Patrice Paradise   Encounter Date: 04/11/2017  PT End of Session - 04/11/17 1013    Visit Number  8    Number of Visits  12    Date for PT Re-Evaluation  04/18/17    PT Start Time  0920    PT Stop Time  1015    PT Time Calculation (min)  55 min       Past Medical History:  Diagnosis Date  . Allergy   . Asthma   . Breast cancer of 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 breast ([place]) 11/13/2014  . GERD (gastroesophageal reflux disease)   . H/O bilateral inguinal hernia repair 1992  . Hypercholesteremia   . Migraines   . Personal history of radiation therapy   . Plantar fasciitis, bilateral   . Radiation 01/12/15-02/04/15   right breast 42.17 gray  . Spinal stenosis   . Thyroid disease     Past Surgical History:  Procedure Laterality Date  . BLADDER SUSPENSION    . BLEPHAROPLASTY Bilateral   . BREAST LUMPECTOMY Right 12/10/2014  . BREAST LUMPECTOMY WITH RADIOACTIVE SEED LOCALIZATION Right 12/10/2014   Procedure: RIGHT BREAST LUMPECTOMY WITH RADIOACTIVE SEED LOCALIZATION;  Surgeon: Autumn Messing III, MD;  Location: [place];  Service: General;  Laterality: Right;  . FOOT SURGERY     plantar fashiectomies from both feet  . HERNIA REPAIR    . NASAL SEPTUM SURGERY    . neuromas removed from right foot    . 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 removal of basal cell carcinoma (nose)    . TONSILECTOMY/ADENOIDECTOMY WITH MYRINGOTOMY    . TONSILLECTOMY      There were no vitals filed for this visit.  Subjective Assessment - 04/11/17 0919    Subjective  dizziness better and only 4 HA since last session    Currently in Pain?  Yes    Pain Score  2     Pain Location  Head                       [facility] Adult PT Treatment/Exercise - 04/11/17 0001      Neck Exercises: Machines for Strengthening   UBE (Upper Arm Bike)  constant 25 3 fwd/3 back HR 118    Nustep  level 5 x 8 minutes HR 100    Other Machines for Strengthening  Elliptical 33fd/3back I 6 R 4 HR 123      Neck Exercises: Standing   Other Standing Exercises  3# UB circuit 10 reps 2 sets ( HR  147 with no increased symptoms) LE circiut 10 reps 2 sets ( squat,lunge, step up)    Other Standing Exercises  scap stab with pulley 15 each      Neck Exercises: Supine   Neck Retraction  15 reps;3 secs head on ball    Other Supine Exercise  wt ball cerv ex with head on ball in retraction      Manual Therapy   Manual Therapy  Manual Traction;Taping    Manual therapy comments  gentle PROM of the cervical spine, some shoulder depression manually    Manual Traction  gentle occipital release STW to traps    Kinesiotex  Create Space traps               PT Short Term Goals - 03/23/17 1742      PT SHORT TERM GOAL #1   Title  independent with initial HEP for flexibility exercise     Status  On-going        PT Long Term Goals - 03/30/17 1554      PT LONG TERM GOAL #1   Title  patient will return to driving    Status  On-going      PT LONG TERM GOAL #2   Title  patient will return to exercising at the gym    Status  On-going      PT LONG TERM GOAL #3   Title  decrease HA frequency by 50%    Status  Partially Met      PT LONG TERM GOAL #4   Title  tolerate 45 minutes of activity without rest    Status  Partially Met      PT LONG TERM GOAL #5   Title  decrease pain 50%    Status  Partially Met            Plan - 04/11/17 1014    Clinical Impression Statement  pt tolerating interventions well, HR increases as expected with activity without symptoms. increased trigger pts in traps so added STW and kiniseo tape. minimal dizziness with transition mvmt.    PT  Treatment/Interventions  ADLs/Self Care Home Management;Cryotherapy;Electrical Stimulation;Moist Heat;Neuromuscular re-education;Balance training;Therapeutic exercise;Therapeutic activities;Functional mobility training;Patient/family education;Manual techniques    PT Next Visit Plan  assess goals       Patient will benefit from skilled therapeutic intervention in order to improve the following deficits and impairments:  Decreased range of motion, Cardiopulmonary status limiting activity, Decreased endurance, Dizziness, Increased muscle spasms, Decreased activity tolerance, Pain, Decreased balance, Impaired flexibility, Improper body mechanics, Postural dysfunction, Decreased strength  Visit Diagnosis: Cervicalgia  Cramp and spasm  Post concussion syndrome  Muscle weakness (generalized)     Problem List Patient Active Problem List   Diagnosis Date Noted  . Genetic testing 12/10/2014  . Breast cancer of 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 breast ([place]) 11/13/2014  . Allergic rhinoconjunctivitis 10/22/2014  . Asthma 10/22/2014    PAYSEUR,ANGIE PTA 04/11/2017, 10:15 AM  [place], [place], [address] Phone: [phone]   Fax:  [phone]  Name: Valerie Snyder MRN: [id number] Date of Birth: [birth year]

## 2017-04-14 ENCOUNTER — Ambulatory Visit: Payer: 59 | Admitting: Physical Therapy

## 2017-04-14 ENCOUNTER — Ambulatory Visit: Payer: PRIVATE HEALTH INSURANCE | Admitting: Psychology

## 2017-04-14 DIAGNOSIS — F0781 Postconcussional syndrome: Secondary | ICD-10-CM

## 2017-04-14 DIAGNOSIS — M6281 Muscle weakness (generalized): Secondary | ICD-10-CM

## 2017-04-14 DIAGNOSIS — M542 Cervicalgia: Secondary | ICD-10-CM

## 2017-04-14 DIAGNOSIS — R252 Cramp and spasm: Secondary | ICD-10-CM

## 2017-04-14 DIAGNOSIS — F4310 Post-traumatic stress disorder, unspecified: Secondary | ICD-10-CM | POA: Diagnosis not present

## 2017-04-14 NOTE — Therapy (Signed)
[address] [place], [place], [address] Phone: [phone]   Fax:  [phone]  Physical Therapy Treatment  Patient Details  Name: Valerie Snyder MRN: [id number] Date of Birth: 1950-11-25 Referring Provider: Patrice Paradise   Encounter Date: 04/14/2017  PT End of Session - 04/14/17 1104    Visit Number  9    Number of Visits  12    Date for PT Re-Evaluation  04/18/17    PT Start Time  1010    PT Stop Time  1100    PT Time Calculation (min)  50 min       Past Medical History:  Diagnosis Date  . Allergy   . Asthma   . Breast cancer of 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 breast ([place]) 11/13/2014  . GERD (gastroesophageal reflux disease)   . H/O bilateral inguinal hernia repair 1992  . Hypercholesteremia   . Migraines   . Personal history of radiation therapy   . Plantar fasciitis, bilateral   . Radiation 01/12/15-02/04/15   right breast 42.17 gray  . Spinal stenosis   . Thyroid disease     Past Surgical History:  Procedure Laterality Date  . BLADDER SUSPENSION    . BLEPHAROPLASTY Bilateral   . BREAST LUMPECTOMY Right 12/10/2014  . BREAST LUMPECTOMY WITH RADIOACTIVE SEED LOCALIZATION Right 12/10/2014   Procedure: RIGHT BREAST LUMPECTOMY WITH RADIOACTIVE SEED LOCALIZATION;  Surgeon: Autumn Messing III, MD;  Location: [facility];  Service: General;  Laterality: Right;  . FOOT SURGERY     plantar fashiectomies from both feet  . HERNIA REPAIR    . NASAL SEPTUM SURGERY    . neuromas removed from right foot    . 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 removal of basal cell carcinoma (nose)    . TONSILECTOMY/ADENOIDECTOMY WITH MYRINGOTOMY    . TONSILLECTOMY      There were no vitals filed for this visit.  Subjective Assessment - 04/14/17 1012    Subjective  tape helped, decreased stress noted. feeling I can start transitioning into gym                      [facility] Adult  PT Treatment/Exercise - 04/14/17 0001      Neck Exercises: Machines for Strengthening   UBE (Upper Arm Bike)  constant 25 3 fwd/3 back HR 120    Other Machines for Strengthening  Elliptical 4 fwd/4 back I 7 R 5 HR 120      Neck Exercises: Standing   Other Standing Exercises  resisted gait 6 inch step ups fwd and SW    Other Standing Exercises  STS activities with wt ball      Neck Exercises: Seated   Other Seated Exercise  ball slams- standing walking lunges with OH press 3 #    Other Seated Exercise  trampoline impact      Manual Therapy   Manual Therapy  Manual Traction;Taping    Manual therapy comments  gentle PROM of the cervical spine, some shoulder depression manually    Manual Traction  gentle occipital release    Kinesiotex  Create Space               PT Short Term Goals - 04/14/17 1044      PT SHORT TERM GOAL #1   Title  independent with initial HEP for flexibility exercise     Status  Achieved  PT Long Term Goals - 04/14/17 1043      PT LONG TERM GOAL #1   Title  patient will return to driving    Status  On-going      PT LONG TERM GOAL #2   Title  patient will return to exercising at the gym    Status  Partially Met      PT LONG TERM GOAL #3   Title  decrease HA frequency by 50%    Status  Partially Met      PT LONG TERM GOAL #4   Title  tolerate 45 minutes of activity without rest    Status  Partially Met      PT LONG TERM GOAL #5   Title  decrease pain 50%    Status  Partially Met            Plan - 04/14/17 1104    Clinical Impression Statement  progressing with all goals and activitiy and moving towards independant gym program    PT Treatment/Interventions  ADLs/Self Care Home Management;Cryotherapy;Electrical Stimulation;Moist Heat;Neuromuscular re-education;Balance training;Therapeutic exercise;Therapeutic activities;Functional mobility training;Patient/family education;Manual techniques    PT Next Visit Plan  prepare for D/C  and  gym transition       Patient will benefit from skilled therapeutic intervention in order to improve the following deficits and impairments:  Decreased range of motion, Cardiopulmonary status limiting activity, Decreased endurance, Dizziness, Increased muscle spasms, Decreased activity tolerance, Pain, Decreased balance, Impaired flexibility, Improper body mechanics, Postural dysfunction, Decreased strength  Visit Diagnosis: Cervicalgia  Cramp and spasm  Post concussion syndrome  Muscle weakness (generalized)     Problem List Patient Active Problem List   Diagnosis Date Noted  . Genetic testing 12/10/2014  . Breast cancer of 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 breast ([place]) 11/13/2014  . Allergic rhinoconjunctivitis 10/22/2014  . Asthma 10/22/2014    Anissia Wessells,ANGIE PTA 04/14/2017, 11:06 AM  [place] [address] [place], [place], [address] Phone: [phone]   Fax:  [phone]  Name: Valerie Snyder MRN: [id number] Date of Birth: [birth year]

## 2017-04-18 ENCOUNTER — Ambulatory Visit: Payer: 59 | Admitting: Physical Therapy

## 2017-04-18 DIAGNOSIS — F0781 Postconcussional syndrome: Secondary | ICD-10-CM

## 2017-04-18 DIAGNOSIS — R252 Cramp and spasm: Secondary | ICD-10-CM

## 2017-04-18 DIAGNOSIS — M6281 Muscle weakness (generalized): Secondary | ICD-10-CM

## 2017-04-18 DIAGNOSIS — M542 Cervicalgia: Secondary | ICD-10-CM | POA: Diagnosis not present

## 2017-04-18 NOTE — Therapy (Signed)
[address] [place] [address] [place], [place], [address] Phone: [phone]   Fax:  [phone]  Physical Therapy Treatment  Patient Details  Name: Valerie Snyder MRN: [id number] Date of Birth: 06-25-1950 Referring Provider: Patrice Paradise   Encounter Date: 04/18/2017  PT End of Session - 04/18/17 1233    Visit Number  10    Number of Visits  12    Date for PT Re-Evaluation  04/18/17    PT Start Time  8101    PT Stop Time  1230    PT Time Calculation (min)  45 min       Past Medical History:  Diagnosis Date  . Allergy   . Asthma   . Breast cancer of 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 breast ([place]) 11/13/2014  . GERD (gastroesophageal reflux disease)   . H/O bilateral inguinal hernia repair 1992  . Hypercholesteremia   . Migraines   . Personal history of radiation therapy   . Plantar fasciitis, bilateral   . Radiation 01/12/15-02/04/15   right breast 42.17 gray  . Spinal stenosis   . Thyroid disease     Past Surgical History:  Procedure Laterality Date  . BLADDER SUSPENSION    . BLEPHAROPLASTY Bilateral   . BREAST LUMPECTOMY Right 12/10/2014  . BREAST LUMPECTOMY WITH RADIOACTIVE SEED LOCALIZATION Right 12/10/2014   Procedure: RIGHT BREAST LUMPECTOMY WITH RADIOACTIVE SEED LOCALIZATION;  Surgeon: Autumn Messing III, MD;  Location: [facility];  Service: General;  Laterality: Right;  . FOOT SURGERY     plantar fashiectomies from both feet  . HERNIA REPAIR    . NASAL SEPTUM SURGERY    . neuromas removed from right foot    . 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 removal of basal cell carcinoma (nose)    . TONSILECTOMY/ADENOIDECTOMY WITH MYRINGOTOMY    . TONSILLECTOMY      There were no vitals filed for this visit.  Subjective Assessment - 04/18/17 1145    Subjective  doing pretty well. only 1 HA since last visit. VOR doing without dizziness    Currently in Pain?  No/denies                       [facility] Adult PT Treatment/Exercise - 04/18/17 0001      Neck Exercises: Machines for Strengthening   UBE (Upper Arm Bike)  constant 25 3 fwd/3 back    Other Machines for Strengthening  Elliptical 4 fwd/4 back I 7 R 5 HR 98    Other Machines for Strengthening  leg press 40# 3 set s10 rows 25# 3 sets 10      Neck Exercises: Standing   Other Standing Exercises  4# UB circuits    Other Standing Exercises  wt ball OH and obl resisted gait      Modalities   Modalities  Moist Heat      Moist Heat Therapy   Number Minutes Moist Heat  10 Minutes    Moist Heat Location  Cervical      Manual Therapy   Kinesiotex  -- did not tape today as just removed tape             PT Education - 04/18/17 1147    Education provided  Yes    Education Details  increase vest occ ex to busy option ie TV    Person(s) Educated  Patient    Methods  Explanation    Comprehension  Verbalized understanding       PT Short Term Goals - 04/14/17 1044      PT SHORT TERM GOAL #1   Title  independent with initial HEP for flexibility exercise     Status  Achieved        PT Long Term Goals - 04/18/17 1233      PT LONG TERM GOAL #1   Title  patient will return to driving    Status  On-going      PT LONG TERM GOAL #2   Title  patient will return to exercising at the gym    Status  Partially Met      PT LONG TERM GOAL #3   Title  decrease HA frequency by 50%    Status  Achieved      PT LONG TERM GOAL #4   Title  tolerate 45 minutes of activity without rest    Status  Partially Met      PT LONG TERM GOAL #5   Title  decrease pain 50%    Status  Achieved            Plan - 04/18/17 1234    Clinical Impression Statement  progressing with goals. pt can toelrate 45 min of ex but needs rest. HA  and pain decreased 50%.     PT Treatment/Interventions  ADLs/Self Care Home Management;Cryotherapy;Electrical Stimulation;Moist Heat;Neuromuscular re-education;Balance  training;Therapeutic exercise;Therapeutic activities;Functional mobility training;Patient/family education;Manual techniques    PT Next Visit Plan  prepare for D/C and  gym transition       Patient will benefit from skilled therapeutic intervention in order to improve the following deficits and impairments:  Decreased range of motion, Cardiopulmonary status limiting activity, Decreased endurance, Dizziness, Increased muscle spasms, Decreased activity tolerance, Pain, Decreased balance, Impaired flexibility, Improper body mechanics, Postural dysfunction, Decreased strength  Visit Diagnosis: Cervicalgia  Cramp and spasm  Post concussion syndrome  Muscle weakness (generalized)     Problem List Patient Active Problem List   Diagnosis Date Noted  . Genetic testing 12/10/2014  . Breast cancer of 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 breast ([place]) 11/13/2014  . Allergic rhinoconjunctivitis 10/22/2014  . Asthma 10/22/2014    Pratham Cassatt,ANGIE PTA 04/18/2017, 12:35 PM  [place] [address] [place], [place], [address] Phone: [phone]   Fax:  [phone]  Name: Valerie Snyder MRN: [id number] Date of Birth: 1950/10/04

## 2017-04-20 ENCOUNTER — Ambulatory Visit: Payer: 59 | Admitting: Physical Therapy

## 2017-04-20 DIAGNOSIS — R252 Cramp and spasm: Secondary | ICD-10-CM

## 2017-04-20 DIAGNOSIS — F0781 Postconcussional syndrome: Secondary | ICD-10-CM

## 2017-04-20 DIAGNOSIS — M542 Cervicalgia: Secondary | ICD-10-CM | POA: Diagnosis not present

## 2017-04-20 DIAGNOSIS — M6281 Muscle weakness (generalized): Secondary | ICD-10-CM

## 2017-04-20 NOTE — Addendum Note (Signed)
Addended by: Sumner Boast on: 04/20/2017 05:43 PM   Modules accepted: Orders

## 2017-04-20 NOTE — Therapy (Signed)
[address] [place], [place], [address] Phone: [phone]   Fax:  [phone]  Physical Therapy Treatment  Patient Details  Name: Valerie Snyder MRN: [id number] Date of Birth: Oct 31, 1950 Referring Provider: Patrice Paradise   Encounter Date: 04/20/2017  PT End of Session - 04/20/17 1702    Visit Number  11    Number of Visits  12    Date for PT Re-Evaluation  04/18/17    PT Start Time  1610    PT Stop Time  1700    PT Time Calculation (min)  50 min       Past Medical History:  Diagnosis Date  . Allergy   . Asthma   . Breast cancer of 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 breast ([facility]) 11/13/2014  . GERD (gastroesophageal reflux disease)   . H/O bilateral inguinal hernia repair 1992  . Hypercholesteremia   . Migraines   . Personal history of radiation therapy   . Plantar fasciitis, bilateral   . Radiation 01/12/15-02/04/15   right breast 42.17 gray  . Spinal stenosis   . Thyroid disease     Past Surgical History:  Procedure Laterality Date  . BLADDER SUSPENSION    . BLEPHAROPLASTY Bilateral   . BREAST LUMPECTOMY Right 12/10/2014  . BREAST LUMPECTOMY WITH RADIOACTIVE SEED LOCALIZATION Right 12/10/2014   Procedure: RIGHT BREAST LUMPECTOMY WITH RADIOACTIVE SEED LOCALIZATION;  Surgeon: Autumn Messing III, MD;  Location: [facility];  Service: General;  Laterality: Right;  . FOOT SURGERY     plantar fashiectomies from both feet  . HERNIA REPAIR    . NASAL SEPTUM SURGERY    . neuromas removed from right foot    . 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 removal of basal cell carcinoma (nose)    . TONSILECTOMY/ADENOIDECTOMY WITH MYRINGOTOMY    . TONSILLECTOMY      There were no vitals filed for this visit.  Subjective Assessment - 04/20/17 1609    Subjective  no HA since last session. physically 85% better- head stuff is still an isssue. HA not related to physical activty (  lights,noises and quick mvmt)    Currently in Pain?  No/denies                      [facility] Adult PT Treatment/Exercise - 04/20/17 0001      High Level Balance   High Level Balance Comments  vestibular exercises      Neck Exercises: Machines for Strengthening   UBE (Upper Arm Bike)  constant 25 3 fwd/3 back HR 109    Nustep  L 6 8 min HR 90    Other Machines for Strengthening  knee ext 10# 3 sets10. HS curl 20# 3 sets 10      Neck Exercises: Standing   Other Standing Exercises  pulley scap stab 3 way      Neck Exercises: Supine   Neck Retraction  15 reps;3 secs UE ther ex with wt with head on ball      Manual Therapy   Manual Therapy  Manual Traction;Soft tissue mobilization    Manual therapy comments  gentle PROM of the cervical spine, some shoulder depression manually    Kinesiotex  Create Space               PT Short Term Goals - 04/14/17 1044      PT SHORT TERM GOAL #1  Title  independent with initial HEP for flexibility exercise     Status  Achieved        PT Long Term Goals - 04/20/17 1646      PT LONG TERM GOAL #1   Title  patient will return to driving    Status  On-going      PT LONG TERM GOAL #2   Title  patient will return to exercising at the gym    Status  Partially Met      PT LONG TERM GOAL #3   Title  decrease HA frequency by 50%    Status  Achieved      PT LONG TERM GOAL #4   Title  tolerate 45 minutes of activity without rest    Status  Partially Met      PT LONG TERM GOAL #5   Title  decrease pain 50%    Status  Achieved            Plan - 04/20/17 1703    Clinical Impression Statement  tolerated all intervention well. no lob or dizziness with vest ex.    PT Next Visit Plan  prepare for D/C and  gym transition       Patient will benefit from skilled therapeutic intervention in order to improve the following deficits and impairments:  Decreased range of motion, Cardiopulmonary status limiting activity,  Decreased endurance, Dizziness, Increased muscle spasms, Decreased activity tolerance, Pain, Decreased balance, Impaired flexibility, Improper body mechanics, Postural dysfunction, Decreased strength  Visit Diagnosis: Cervicalgia  Cramp and spasm  Post concussion syndrome  Muscle weakness (generalized)     Problem List Patient Active Problem List   Diagnosis Date Noted  . Genetic testing 12/10/2014  . Breast cancer of 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 breast ([facility]) 11/13/2014  . Allergic rhinoconjunctivitis 10/22/2014  . Asthma 10/22/2014    Charnise Lovan,ANGIE  PTA 04/20/2017, 5:04 PM  [address] [place], [place], [address] Phone: [phone]   Fax:  [phone]  Name: Valerie Snyder MRN: [id number] Date of Birth: April 30, 1950

## 2017-04-25 ENCOUNTER — Ambulatory Visit: Payer: 59 | Admitting: Physical Therapy

## 2017-04-25 DIAGNOSIS — M6281 Muscle weakness (generalized): Secondary | ICD-10-CM

## 2017-04-25 DIAGNOSIS — F0781 Postconcussional syndrome: Secondary | ICD-10-CM

## 2017-04-25 DIAGNOSIS — R252 Cramp and spasm: Secondary | ICD-10-CM

## 2017-04-25 DIAGNOSIS — M542 Cervicalgia: Secondary | ICD-10-CM

## 2017-04-25 NOTE — Therapy (Signed)
[address] [place], [place], [address] Phone: [phone]   Fax:  [phone]  Physical Therapy Treatment  Patient Details  Name: Valerie Snyder MRN: [id number] Date of Birth: Jan 10, 1951 Referring Provider: Patrice Paradise   Encounter Date: 04/25/2017  PT End of Session - 04/25/17 1255    Visit Number  12    Date for PT Re-Evaluation  05/19/17    PT Start Time  1155    PT Stop Time  1240    PT Time Calculation (min)  45 min       Past Medical History:  Diagnosis Date  . Allergy   . Asthma   . Breast cancer of 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 breast ([facility]) 11/13/2014  . GERD (gastroesophageal reflux disease)   . H/O bilateral inguinal hernia repair 1992  . Hypercholesteremia   . Migraines   . Personal history of radiation therapy   . Plantar fasciitis, bilateral   . Radiation 01/12/15-02/04/15   right breast 42.17 gray  . Spinal stenosis   . Thyroid disease     Past Surgical History:  Procedure Laterality Date  . BLADDER SUSPENSION    . BLEPHAROPLASTY Bilateral   . BREAST LUMPECTOMY Right 12/10/2014  . BREAST LUMPECTOMY WITH RADIOACTIVE SEED LOCALIZATION Right 12/10/2014   Procedure: RIGHT BREAST LUMPECTOMY WITH RADIOACTIVE SEED LOCALIZATION;  Surgeon: Autumn Messing III, MD;  Location: [facility];  Service: General;  Laterality: Right;  . FOOT SURGERY     plantar fashiectomies from both feet  . HERNIA REPAIR    . NASAL SEPTUM SURGERY    . neuromas removed from right foot    . 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 removal of basal cell carcinoma (nose)    . TONSILECTOMY/ADENOIDECTOMY WITH MYRINGOTOMY    . TONSILLECTOMY      There were no vitals filed for this visit.  Subjective Assessment - 04/25/17 1253    Subjective  doing well    Currently in Pain?  No/denies                No data recorded       [facility] Adult PT Treatment/Exercise - 04/25/17 0001       Neck Exercises: Machines for Strengthening   UBE (Upper Arm Bike)  constant 25 3 fwd/3 back    Nustep  L 6 8 min    Other Machines for Strengthening  lat pull and rows black tband trunk flex/ext    Other Machines for Strengthening  knee ext 10# 3 sets10. HS curl 20# 3 sets 10 leg press 2 sets 15 40$      Neck Exercises: Supine   Neck Retraction  15 reps;3 secs      Manual Therapy   Manual Therapy  Manual Traction;Soft tissue mobilization    Manual therapy comments  gentle PROM of the cervical spine, some shoulder depression manually               PT Short Term Goals - 04/14/17 1044      PT SHORT TERM GOAL #1   Title  independent with initial HEP for flexibility exercise     Status  Achieved        PT Long Term Goals - 04/20/17 1646      PT LONG TERM GOAL #1   Title  patient will return to driving    Status  On-going  PT LONG TERM GOAL #2   Title  patient will return to exercising at the gym    Status  Partially Met      PT LONG TERM GOAL #3   Title  decrease HA frequency by 50%    Status  Achieved      PT LONG TERM GOAL #4   Title  tolerate 45 minutes of activity without rest    Status  Partially Met      PT LONG TERM GOAL #5   Title  decrease pain 50%    Status  Achieved            Plan - 04/25/17 1255    Clinical Impression Statement  tol all interventions well with good HR    PT Treatment/Interventions  ADLs/Self Care Home Management;Cryotherapy;Electrical Stimulation;Moist Heat;Neuromuscular re-education;Balance training;Therapeutic exercise;Therapeutic activities;Functional mobility training;Patient/family education;Manual techniques    PT Next Visit Plan  D/C       Patient will benefit from skilled therapeutic intervention in order to improve the following deficits and impairments:  Decreased range of motion, Cardiopulmonary status limiting activity, Decreased endurance, Dizziness, Increased muscle spasms, Decreased activity  tolerance, Pain, Decreased balance, Impaired flexibility, Improper body mechanics, Postural dysfunction, Decreased strength  Visit Diagnosis: Cervicalgia  Cramp and spasm  Post concussion syndrome  Muscle weakness (generalized)     Problem List Patient Active Problem List   Diagnosis Date Noted  . Genetic testing 12/10/2014  . Breast cancer of 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 breast ([facility]) 11/13/2014  . Allergic rhinoconjunctivitis 10/22/2014  . Asthma 10/22/2014    Cassey Hurrell,ANGIE PTA 04/25/2017, 12:56 PM  [address] [place], [place], [address] Phone: [phone]   Fax:  [phone]  Name: Kareemah Grounds MRN: [id number] Date of Birth: [birth year]

## 2017-04-27 ENCOUNTER — Ambulatory Visit: Payer: 59 | Admitting: Physical Therapy

## 2017-04-27 DIAGNOSIS — M542 Cervicalgia: Secondary | ICD-10-CM | POA: Diagnosis not present

## 2017-04-27 DIAGNOSIS — M6281 Muscle weakness (generalized): Secondary | ICD-10-CM

## 2017-04-27 DIAGNOSIS — F0781 Postconcussional syndrome: Secondary | ICD-10-CM

## 2017-04-27 DIAGNOSIS — R252 Cramp and spasm: Secondary | ICD-10-CM

## 2017-04-27 NOTE — Therapy (Signed)
[place] [address] [place], [place], [address] Phone: [phone]   Fax:  [phone]  Physical Therapy Treatment  Patient Details  Name: Valerie Snyder MRN: [id number] Date of Birth: 20-Feb-1950 Referring Provider: Patrice Paradise   Encounter Date: 04/27/2017  PT End of Session - 04/27/17 1407    Visit Number  13    Number of Visits  12    Date for PT Re-Evaluation  05/19/17    PT Start Time  1335    PT Stop Time  1430    PT Time Calculation (min)  55 min       Past Medical History:  Diagnosis Date  . Allergy   . Asthma   . Breast cancer of 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 breast ([facility]) 11/13/2014  . GERD (gastroesophageal reflux disease)   . H/O bilateral inguinal hernia repair 1992  . Hypercholesteremia   . Migraines   . Personal history of radiation therapy   . Plantar fasciitis, bilateral   . Radiation 01/12/15-02/04/15   right breast 42.17 gray  . Spinal stenosis   . Thyroid disease     Past Surgical History:  Procedure Laterality Date  . BLADDER SUSPENSION    . BLEPHAROPLASTY Bilateral   . BREAST LUMPECTOMY Right 12/10/2014  . BREAST LUMPECTOMY WITH RADIOACTIVE SEED LOCALIZATION Right 12/10/2014   Procedure: RIGHT BREAST LUMPECTOMY WITH RADIOACTIVE SEED LOCALIZATION;  Surgeon: Autumn Messing III, MD;  Location: [facility];  Service: General;  Laterality: Right;  . FOOT SURGERY     plantar fashiectomies from both feet  . HERNIA REPAIR    . NASAL SEPTUM SURGERY    . neuromas removed from right foot    . 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 removal of basal cell carcinoma (nose)    . TONSILECTOMY/ADENOIDECTOMY WITH MYRINGOTOMY    . TONSILLECTOMY      There were no vitals filed for this visit.  Subjective Assessment - 04/27/17 1343    Subjective  I feel good about being D/C today and transition back to [facility]    Currently in Pain?  No/denies                No  data recorded       [facility] Adult PT Treatment/Exercise - 04/27/17 0001      Neck Exercises: Machines for Strengthening   UBE (Upper Arm Bike)  constant 25 3 fwd/3 back    Nustep  Elliptical 4 fwd/4back I 8 R 5 HR 128    Other Machines for Strengthening  lat pull and rows 25# 2 set s15    Other Machines for Strengthening  knee ext 10# 3 sets10. HS curl 20# 3 sets 10      Neck Exercises: Standing   Other Standing Exercises  wt cable obl and scap stab      Neck Exercises: Supine   Neck Retraction  15 reps;3 secs UE with cerv stab on ball      Manual Therapy   Manual Therapy  Manual Traction;Soft tissue mobilization    Manual therapy comments  gentle PROM of the cervical spine, some shoulder depression manually               PT Short Term Goals - 04/14/17 1044      PT SHORT TERM GOAL #1   Title  independent with initial HEP for flexibility exercise     Status  Achieved  PT Long Term Goals - 04/27/17 1405      PT LONG TERM GOAL #1   Title  patient will return to driving    Status  On-going      PT LONG TERM GOAL #2   Title  patient will return to exercising at the gym    Status  Partially Met      PT LONG TERM GOAL #3   Title  decrease HA frequency by 50%    Status  Achieved      PT LONG TERM GOAL #4   Title  tolerate 45 minutes of activity without rest    Status  Achieved      PT LONG TERM GOAL #5   Title  decrease pain 50%    Status  Achieved            Plan - 04/27/17 1406    Clinical Impression Statement  all goals met except getting cognitive therapy for return to driving    PT Treatment/Interventions  ADLs/Self Care Home Management;Cryotherapy;Electrical Stimulation;Moist Heat;Neuromuscular re-education;Balance training;Therapeutic exercise;Therapeutic activities;Functional mobility training;Patient/family education;Manual techniques    PT Next Visit Plan  D/C       Patient will benefit from skilled therapeutic intervention in order  to improve the following deficits and impairments:  Decreased range of motion, Cardiopulmonary status limiting activity, Decreased endurance, Dizziness, Increased muscle spasms, Decreased activity tolerance, Pain, Decreased balance, Impaired flexibility, Improper body mechanics, Postural dysfunction, Decreased strength  Visit Diagnosis: Cervicalgia  Cramp and spasm  Post concussion syndrome  Muscle weakness (generalized)     Problem List Patient Active Problem List   Diagnosis Date Noted  . Genetic testing 12/10/2014  . Breast cancer of 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 breast ([facility]) 11/13/2014  . Allergic rhinoconjunctivitis 10/22/2014  . Asthma 10/22/2014   PHYSICAL THERAPY DISCHARGE SUMMARY   Plan: Patient agrees to discharge.  Patient goals were partially met. Patient is being discharged due to meeting the stated rehab goals.  ?????      Armetta Henri,ANGIE PTA 04/27/2017, 2:34 PM  [address] [place], [place], [address] Phone: [phone]   Fax:  [phone]  Name: Valerie Snyder MRN: [id number] Date of Birth: December 24, 1950

## 2017-05-03 ENCOUNTER — Ambulatory Visit: Payer: PRIVATE HEALTH INSURANCE | Admitting: Psychology

## 2017-05-08 ENCOUNTER — Other Ambulatory Visit: Payer: Self-pay | Admitting: Obstetrics & Gynecology

## 2017-05-12 ENCOUNTER — Ambulatory Visit: Payer: PRIVATE HEALTH INSURANCE | Admitting: Psychology

## 2017-05-16 ENCOUNTER — Telehealth: Payer: Self-pay

## 2017-05-16 NOTE — Telephone Encounter (Signed)
Returned pt call regarding transvaginal results from pt's GYN. Due to endometrial thickening per Dr. Lindi Adie pt is to cut tamoxifen dosage in half. She can now take 1/2 tablets, 10 mg of tamoxifen instead of 20. If bleeding continues to worsen she is to refer back to her gynecologist.  Cyndia Bent RN

## 2017-05-18 ENCOUNTER — Ambulatory Visit: Payer: PRIVATE HEALTH INSURANCE | Admitting: Psychology

## 2017-05-22 ENCOUNTER — Encounter: Payer: Medicare Other | Admitting: Psychology

## 2017-05-25 ENCOUNTER — Telehealth: Payer: Self-pay

## 2017-05-25 ENCOUNTER — Ambulatory Visit: Payer: PRIVATE HEALTH INSURANCE | Admitting: Psychology

## 2017-05-25 DIAGNOSIS — F4310 Post-traumatic stress disorder, unspecified: Secondary | ICD-10-CM

## 2017-05-25 NOTE — Telephone Encounter (Signed)
Received call from pt regarding some advise on how to take her tamoxifen. Pt was told to start 1/2 a tablet for a few weeks, daily until she can tolerate the whole 20mg  dose. Pt noticed on the pill bottle label that she should not cut the pill in half. Pt contacting provider to see if she needs a 10mg  pill sent to pharmacy, instead of cutting her pill in half.  Called pt and lvm with call back number to let her know that if she had been tolerating 1/2 a tablet of tamoxifen, she may take 20mg  every other day and increase to daily. If pt would like to continue on 10mg  for a few more weeks, we can send her a 10mg  script at her local pharmacy.

## 2017-06-01 ENCOUNTER — Encounter: Payer: Medicare Other | Admitting: Psychology

## 2017-06-02 ENCOUNTER — Ambulatory Visit: Payer: PRIVATE HEALTH INSURANCE | Admitting: Psychology

## 2017-06-02 DIAGNOSIS — F4310 Post-traumatic stress disorder, unspecified: Secondary | ICD-10-CM | POA: Diagnosis not present

## 2017-06-06 ENCOUNTER — Encounter (HOSPITAL_COMMUNITY): Payer: Self-pay | Admitting: Emergency Medicine

## 2017-06-06 ENCOUNTER — Other Ambulatory Visit: Payer: Self-pay

## 2017-06-06 ENCOUNTER — Emergency Department (HOSPITAL_COMMUNITY)
Admission: EM | Admit: 2017-06-06 | Discharge: 2017-06-07 | Disposition: A | Discharge status: Home / Self Care | Payer: 59 | Attending: Emergency Medicine | Admitting: Emergency Medicine

## 2017-06-06 DIAGNOSIS — J45909 Unspecified asthma, uncomplicated: Secondary | ICD-10-CM | POA: Diagnosis not present

## 2017-06-06 DIAGNOSIS — Z79899 Other long term (current) drug therapy: Secondary | ICD-10-CM | POA: Diagnosis not present

## 2017-06-06 DIAGNOSIS — Z853 Personal history of malignant neoplasm of breast: Secondary | ICD-10-CM | POA: Insufficient documentation

## 2017-06-06 DIAGNOSIS — R1031 Right lower quadrant pain: Secondary | ICD-10-CM | POA: Insufficient documentation

## 2017-06-06 LAB — CBC
HCT: 38.4 % (ref 36.0–46.0)
HEMOGLOBIN: 12.5 g/dL (ref 12.0–15.0)
MCH: 29.9 pg (ref 26.0–34.0)
MCHC: 32.6 g/dL (ref 30.0–36.0)
MCV: 91.9 fL (ref 78.0–100.0)
Platelets: 196 10*3/uL (ref 150–400)
RBC: 4.18 MIL/uL (ref 3.87–5.11)
RDW: 14 % (ref 11.5–15.5)
WBC: 4.8 10*3/uL (ref 4.0–10.5)

## 2017-06-06 NOTE — ED Triage Notes (Addendum)
Pt reports R lower quadrant pain since Friday, intermittent and sharp in nature. Pain increasing in frequency. Denies fevers. More fatigued lately.

## 2017-06-07 ENCOUNTER — Ambulatory Visit: Payer: PRIVATE HEALTH INSURANCE | Admitting: Psychology

## 2017-06-07 ENCOUNTER — Emergency Department (HOSPITAL_COMMUNITY): Payer: 59

## 2017-06-07 LAB — COMPREHENSIVE METABOLIC PANEL
ALT: 19 U/L (ref 14–54)
AST: 28 U/L (ref 15–41)
Albumin: 3.4 g/dL — ABNORMAL LOW (ref 3.5–5.0)
Alkaline Phosphatase: 39 U/L (ref 38–126)
Anion gap: 7 (ref 5–15)
BILIRUBIN TOTAL: 0.6 mg/dL (ref 0.3–1.2)
BUN: 17 mg/dL (ref 6–20)
CHLORIDE: 107 mmol/L (ref 101–111)
CO2: 26 mmol/L (ref 22–32)
Calcium: 8.9 mg/dL (ref 8.9–10.3)
Creatinine, Ser: 0.82 mg/dL (ref 0.44–1.00)
GFR calc Af Amer: >60 mL/min (ref >60)
GFR calc non Af Amer: >60 mL/min (ref >60)
GLUCOSE: 106 mg/dL — AB (ref 65–99)
POTASSIUM: 4.4 mmol/L (ref 3.5–5.1)
SODIUM: 140 mmol/L (ref 135–145)
Total Protein: 6 g/dL — ABNORMAL LOW (ref 6.5–8.1)

## 2017-06-07 LAB — URINALYSIS, ROUTINE W REFLEX MICROSCOPIC
Bacteria, UA: NONE SEEN
Bilirubin Urine: NEGATIVE
Glucose, UA: NEGATIVE mg/dL
Ketones, ur: NEGATIVE mg/dL
Leukocytes, UA: NEGATIVE
Nitrite: NEGATIVE
Protein, ur: NEGATIVE mg/dL
Specific Gravity, Urine: 1.017 (ref 1.005–1.030)
pH: 5 (ref 5.0–8.0)

## 2017-06-07 LAB — LIPASE, BLOOD: Lipase: 51 U/L (ref 11–51)

## 2017-06-07 MED ORDER — HYDROCODONE-ACETAMINOPHEN 5-325 MG PO TABS: 1.0000 | ORAL_TABLET | Freq: Four times a day (QID) | ORAL | 0 refills | Status: DC | PRN

## 2017-06-07 NOTE — Discharge Instructions (Addendum)
You were seen today for abdominal pain.  The cause of your pain at this time is unknown; however, your work-up is largely reassuring.  Follow-up closely with your primary doctor.  You will be given a short course of pain medication if symptoms recur.

## 2017-06-07 NOTE — ED Notes (Signed)
PT states understanding of care given, follow up care, and medication prescribed. PT ambulated from ED to car with a steady gait. 

## 2017-06-07 NOTE — ED Provider Notes (Signed)
[facility] EMERGENCY DEPARTMENT Provider Note   CSN: [id number] Arrival date & time: 06/06/17  2302     History   Chief Complaint Chief Complaint  Patient presents with  . Abdominal Pain    HPI Valerie Snyder is a 67 y.o. female.  HPI  This is a 67 year old female with a history of breast cancer on tamoxifen, reflux who presents with abdominal pain.  Patient reports intermittent and recurrent right lower quadrant pain since Friday.  She states that it is sharp in nature and nonradiating.  She reports increasing frequency.  She states at its worse it is "unbearable and a 10 out of 10."  And sometimes she has no pain.  Nothing specifically seems to make it better or worse.  She denies any nausea or vomiting.  She denies any dysuria, hematuria, fevers.  Patient reports recent work-up by her OB/GYN for vaginal bleeding while on tamoxifen.  She reports a negative ultrasound at that time.  Past Medical History:  Diagnosis Date  . Allergy   . Asthma   . Breast cancer of 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 breast ([place]) 11/13/2014  . GERD (gastroesophageal reflux disease)   . H/O bilateral inguinal hernia repair 1992  . Hypercholesteremia   . Migraines   . Personal history of radiation therapy   . Plantar fasciitis, bilateral   . Radiation 01/12/15-02/04/15   right breast 42.17 gray  . Spinal stenosis   . Thyroid disease     Patient Active Problem List   Diagnosis Date Noted  . Genetic testing 12/10/2014  . Breast cancer of 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 breast ([place]) 11/13/2014  . Allergic rhinoconjunctivitis 10/22/2014  . Asthma 10/22/2014    Past Surgical History:  Procedure Laterality Date  . BLADDER SUSPENSION    . BLEPHAROPLASTY Bilateral   . BREAST LUMPECTOMY Right 12/10/2014  . BREAST LUMPECTOMY WITH RADIOACTIVE SEED LOCALIZATION Right 12/10/2014   Procedure: RIGHT BREAST LUMPECTOMY WITH RADIOACTIVE SEED LOCALIZATION;  Surgeon: Autumn Messing  III, MD;  Location: [facility];  Service: General;  Laterality: Right;  . FOOT SURGERY     plantar fashiectomies from both feet  . HERNIA REPAIR    . NASAL SEPTUM SURGERY    . neuromas removed from right foot    . 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 removal of basal cell carcinoma (nose)    . TONSILECTOMY/ADENOIDECTOMY WITH MYRINGOTOMY    . TONSILLECTOMY       OB History   None      Home Medications    Prior to Admission medications   Medication Sig Start Date End Date Taking? Authorizing Provider  albuterol (VENTOLIN HFA) 108 (90 Base) MCG/ACT inhaler Inhale 2 puffs into the lungs every 4 (four) hours as needed for wheezing or shortness of breath (cough). 03/12/15  Yes Gean Quint, MD  cetirizine (ZYRTEC) 10 MG tablet Take 10 mg by mouth daily as needed for allergies.    Yes [provider]  Cholecalciferol (D-3-5) 5000 UNITS capsule Take 5,000 Units by mouth daily.   Yes [provider]  Cranberry 450 MG CAPS Take 450 mg by mouth daily.    Yes [provider]  EPINEPHrine (EPIPEN 2-PAK) 0.3 mg/0.3 mL IJ SOAJ injection Inject 0.3 mLs (0.3 mg total) into the muscle once as needed. Reported on 01/20/2015 07/13/16  Yes Kozlow, Donnamarie Poag, MD  fluticasone [facility]) 50 MCG/ACT nasal spray Use one to two sprays in each  nostril once daily. 01/19/16  Yes Kozlow, Donnamarie Poag, MD  levothyroxine (SYNTHROID, LEVOTHROID) 100 MCG tablet Take 100 mcg by mouth daily before breakfast.   Yes [provider]  Misc Natural Products (OSTEO BI-FLEX ADV TRIPLE ST PO) Take 2 tablets by mouth daily.   Yes [provider]  mometasone (ASMANEX) 220 MCG/INH inhaler Inhale 2 puffs into the lungs daily as needed (for breathing).   Yes [provider]  Multiple Vitamins-Minerals (CENTRUM SILVER ADULT 50+ PO) Take 1 tablet by mouth daily.    Yes [provider]  Omega-3 Fatty Acids (FISH OIL) 1000 MG CAPS Take 1 capsule by mouth  daily.    Yes [provider]  pantoprazole (PROTONIX) 20 MG tablet Take 20 mg by mouth daily. 04/16/17  Yes [provider]  Probiotic Product (SOLUBLE FIBER/PROBIOTICS PO) Take 1 capsule by mouth daily. 20 billion   Yes [provider]  SUMAtriptan (IMITREX) 25 MG tablet Take 25 mg by mouth every 2 (two) hours as needed for migraine.  04/06/17  Yes [provider]  tamoxifen (NOLVADEX) 20 MG tablet TAKE 1 TABLET DAILY (START FEBRUARY 1ST) Patient taking differently: Take 10 mg by mouth daily.  04/04/17  Yes Nicholas Lose, MD  TURMERIC CURCUMIN PO Take 1,000 mg by mouth daily.    Yes [provider]  cyclobenzaprine (FLEXERIL) 5 MG tablet Take 1 tablet (5 mg total) by mouth 2 (two) times daily as needed for muscle spasms. Patient not taking: Reported on 06/07/2017 11/18/16   Caccavale, Sophia, PA-C  HYDROcodone-acetaminophen (NORCO/VICODIN) 5-325 MG tablet Take 1 tablet by mouth every 6 (six) hours as needed. 06/07/17   Slayde Brault, Barbette Hair, MD    Family History Family History  Problem Relation Age of Onset  . Pancreatic cancer Maternal Aunt        dx. 40s-early 39s  . Congestive Heart Failure Mother 60  . Lung disease Mother   . Colon polyps Mother        unspecified number  . Dementia Father        Lewy Body Dementia  . Kidney failure Father   . Stroke Father   . Depression Father   . Multiple sclerosis Sister   . Depression Sister   . Aneurysm Maternal Grandmother        brain  . Heart attack Maternal Grandfather   . Emphysema Paternal Grandfather   . Heart Problems Paternal Grandfather   . Other Sister        3 sisters have history of cysts in uterus  . Depression Sister   . Breast cancer Cousin        dx. 45-50  . Testicular cancer Cousin        dx. 37s  . Colon polyps Daughter        unspecified number    Social History Social History   Tobacco Use  . Smoking status: Never Smoker  . Smokeless tobacco: Never Used  Substance Use  Topics  . Alcohol use: No  . Drug use: No     Allergies   Bee venom; Keflex [cephalexin]; and Toradol [ketorolac tromethamine]   Review of Systems Review of Systems  Constitutional: Negative for fever.  Respiratory: Negative for shortness of breath.   Cardiovascular: Negative for chest pain.  Gastrointestinal: Positive for abdominal pain. Negative for diarrhea, nausea and vomiting.  Genitourinary: Negative for dysuria and hematuria.  Musculoskeletal: Negative for back pain.  All other systems reviewed and are negative.  Physical Exam Updated Vital Signs BP (!) 110/55   Pulse 64   Temp 98 F (36.7 C)   Resp 16   Ht 5\' 3"  (1.6 m)   Wt 63.5 kg (140 lb)   SpO2 99%   BMI 24.80 kg/m   Physical Exam  Constitutional: She is oriented to person, place, and time. She appears well-developed and well-nourished.  Appears younger than stated age  HENT:  Head: Normocephalic and atraumatic.  Neck: Neck supple.  Cardiovascular: Normal rate, regular rhythm and normal heart sounds.  Pulmonary/Chest: Effort normal. No respiratory distress. She has no wheezes.  Abdominal: Soft. Bowel sounds are normal. There is no tenderness. There is no rebound and no guarding.  No tenderness to palpation of the right lower quadrant or right CVA region  Neurological: She is alert and oriented to person, place, and time.  Skin: Skin is warm and dry.  Psychiatric: She has a normal mood and affect.  Nursing note and vitals reviewed.    ED Treatments / Results  Labs (all labs ordered are listed, but only abnormal results are displayed) Labs Reviewed  COMPREHENSIVE METABOLIC PANEL - Abnormal; Notable for the following components:      Result Value   Glucose, Bld 106 (*)    Total Protein 6.0 (*)    Albumin 3.4 (*)    All other components within normal limits  URINALYSIS, ROUTINE W REFLEX MICROSCOPIC - Abnormal; Notable for the following components:   Hgb urine dipstick LARGE (*)    All other  components within normal limits  LIPASE, BLOOD  CBC    EKG None  Radiology Ct Renal Stone Study  Result Date: 06/07/2017 CLINICAL DATA:  67 year old female with right lower quadrant/flank pain. EXAM: CT ABDOMEN AND PELVIS WITHOUT CONTRAST TECHNIQUE: Multidetector CT imaging of the abdomen and pelvis was performed following the standard protocol without IV contrast. COMPARISON:  None. FINDINGS: Evaluation of this exam is limited in the absence of intravenous contrast. Lower chest: There are minimal bibasilar atelectasis/scarring. The lung bases are otherwise clear. There is hypoattenuation of the cardiac blood pool suggestive of a degree of anemia. Clinical correlation is recommended. No intra-abdominal free air or free fluid. Hepatobiliary: Probable mild fatty infiltration of the liver. No intrahepatic biliary ductal dilatation. There are small layering stones within the gallbladder. No pericholecystic fluid. Pancreas: Unremarkable. No pancreatic ductal dilatation or surrounding inflammatory changes. Spleen: Small upper pole splenic calcified granuloma. The spleen is otherwise unremarkable. Adrenals/Urinary Tract: The adrenal glands are unremarkable. There is no hydronephrosis or nephrolithiasis on either side. The visualized ureters and urinary bladder appear unremarkable. Stomach/Bowel: There is no bowel obstruction or active inflammation. No CT evidence of acute appendicitis. Vascular/Lymphatic: Mild aortoiliac atherosclerotic disease. No portal venous gas. There is no adenopathy. Reproductive: The uterus is anteverted and grossly unremarkable. The ovaries are grossly unremarkable as visualized. No pelvic mass. Other: None Musculoskeletal: No acute or significant osseous findings. IMPRESSION: 1. No bowel obstruction or active inflammation. No CT evidence of acute appendicitis. 2. Gallstones. 3. No hydronephrosis or nephrolithiasis. Electronically Signed   By: Anner Crete M.D.   On: 06/07/2017 06:52     Procedures Procedures (including critical care time)  Medications Ordered in ED Medications - No data to display   Initial Impression / Assessment and Plan / ED Course  I have reviewed the triage vital signs and the nursing notes.  Pertinent labs & imaging results that were available during my care of the patient were reviewed by me and  considered in my medical decision making (see chart for details).     Patient presents with isolated, intermittent right lower quadrant pain.  She is overall nontoxic-appearing and vital signs are reassuring.  She has no objective tenderness on exam.  She is currently symptom-free.  Considerations include kidney stone, ovarian pathology, less likely appendicitis given benign exam.  Basic lab work obtained and is completely unremarkable.  Urinalysis with a large hemoglobin.  CT scan obtained to evaluate for stone.  CT scan is negative.  They comment on a normal appendix and normal-appearing ovaries.  Doubt torsion or appendicitis.  Unclear etiology of pain.  Pain could have been caused from recently passed kidney stone.  I discussed this with patient and her husband.  Will discharge home with a short course of pain medication in case symptoms recur.  After history, exam, and medical workup I feel the patient has been appropriately medically screened and is safe for discharge home. Pertinent diagnoses were discussed with the patient. Patient was given return precautions.   Final Clinical Impressions(s) / ED Diagnoses   Final diagnoses:  RLQ abdominal pain    ED Discharge Orders        Ordered    HYDROcodone-acetaminophen (NORCO/VICODIN) 5-325 MG tablet  Every 6 hours PRN     06/07/17 0703       Merryl Hacker, MD 06/07/17 [phone]

## 2017-06-12 ENCOUNTER — Ambulatory Visit: Payer: PRIVATE HEALTH INSURANCE | Admitting: Psychology

## 2017-06-12 DIAGNOSIS — F4310 Post-traumatic stress disorder, unspecified: Secondary | ICD-10-CM | POA: Diagnosis not present

## 2017-06-19 ENCOUNTER — Ambulatory Visit: Payer: PRIVATE HEALTH INSURANCE | Admitting: Psychology

## 2017-06-19 DIAGNOSIS — F4310 Post-traumatic stress disorder, unspecified: Secondary | ICD-10-CM | POA: Diagnosis not present

## 2017-06-29 ENCOUNTER — Ambulatory Visit: Payer: PRIVATE HEALTH INSURANCE | Admitting: Psychology

## 2017-06-29 DIAGNOSIS — F4310 Post-traumatic stress disorder, unspecified: Secondary | ICD-10-CM | POA: Diagnosis not present

## 2017-07-03 ENCOUNTER — Ambulatory Visit: Payer: PRIVATE HEALTH INSURANCE | Admitting: Psychology

## 2017-07-13 ENCOUNTER — Ambulatory Visit: Payer: PRIVATE HEALTH INSURANCE | Admitting: Psychology

## 2017-07-20 ENCOUNTER — Ambulatory Visit: Payer: PRIVATE HEALTH INSURANCE | Admitting: Psychology

## 2017-07-27 ENCOUNTER — Telehealth: Payer: Self-pay

## 2017-07-27 NOTE — Telephone Encounter (Signed)
Patient is calling to see if she can get a refill on her Epipen   [facility] [address]

## 2017-07-28 ENCOUNTER — Other Ambulatory Visit: Payer: Self-pay | Admitting: *Deleted

## 2017-07-28 MED ORDER — EPINEPHRINE 0.3 MG/0.3ML IJ SOAJ: 0.3000 mg | Freq: Once | INTRAMUSCULAR | 1 refills | Status: DC | PRN

## 2017-07-28 NOTE — Telephone Encounter (Signed)
A prescription refill has been sent in.

## 2017-09-25 DIAGNOSIS — D0511 Intraductal carcinoma in situ of right breast: Secondary | ICD-10-CM | POA: Diagnosis not present

## 2017-10-09 DIAGNOSIS — H6122 Impacted cerumen, left ear: Secondary | ICD-10-CM | POA: Diagnosis not present

## 2017-10-10 ENCOUNTER — Other Ambulatory Visit: Payer: Self-pay | Admitting: Hematology and Oncology

## 2017-10-10 DIAGNOSIS — Z853 Personal history of malignant neoplasm of breast: Secondary | ICD-10-CM

## 2017-10-17 ENCOUNTER — Ambulatory Visit: Payer: 59 | Admitting: Allergy and Immunology

## 2017-10-24 ENCOUNTER — Encounter: Payer: Self-pay | Admitting: Allergy and Immunology

## 2017-10-24 ENCOUNTER — Ambulatory Visit (INDEPENDENT_AMBULATORY_CARE_PROVIDER_SITE_OTHER): Payer: Medicare Other | Admitting: Allergy and Immunology

## 2017-10-24 VITALS — BP 112/70 | HR 72 | Resp 18 | Ht 64.5 in | Wt 148.0 lb

## 2017-10-24 DIAGNOSIS — J4520 Mild intermittent asthma, uncomplicated: Secondary | ICD-10-CM

## 2017-10-24 DIAGNOSIS — J3089 Other allergic rhinitis: Secondary | ICD-10-CM | POA: Diagnosis not present

## 2017-10-24 DIAGNOSIS — K219 Gastro-esophageal reflux disease without esophagitis: Secondary | ICD-10-CM

## 2017-10-24 DIAGNOSIS — Z91038 Other insect allergy status: Secondary | ICD-10-CM

## 2017-10-24 DIAGNOSIS — Z91030 Bee allergy status: Secondary | ICD-10-CM

## 2017-10-24 NOTE — Patient Instructions (Addendum)
  1. Continue to treat reflux:   A. Protonix 20 mg 3-7 times a week  2. Continue the following if needed:   A. Zyrtec 10 mg one tablet once a day  B. Mucinex  C. nasal saline / gel  D. Ventolin HFA 2 puffs every 4-6 hours  E. EpiPen  3. Obtain fall flu vaccine     4. Return to clinic in 6 months or earlier if problem

## 2017-10-24 NOTE — Progress Notes (Signed)
Follow-up Note  Referring Provider: Leighton Ruff, MD Primary Provider: Leighton Ruff, MD Date of Office Visit: 10/24/2017  Subjective:   Valerie Snyder (DOB: 08/21/1950) is a 67 y.o. female who returns to the [facility] on 10/24/2017 in re-evaluation of the following:  HPI: [name] returns to this clinic in reevaluation of her allergic rhinoconjunctivitis and intermittent asthma and LPR and distant history of hymenoptera allergy.  Her last visit to this clinic was 22 March 2017.  At that point in time she appeared to have respiratory tract inflammation mostly of her lower airway secondary to influenza.  She has really done well since her last visit and rarely has any lower airway symptoms and can exercise without any difficulty and does not use a short acting bronchodilator.  She has not required a systemic steroid to treat an exacerbation of asthma.  She has had very little problems with her upper airways while intermittently using a antihistamine.  She has not required an antibiotic to treat an episode of sinusitis.  Her reflux is under excellent control while using Protonix at 20 mg 1 time per day.  She has had no issues with her throat.  Allergies as of 10/24/2017      Reactions   Bee Venom Anaphylaxis   Keflex [cephalexin] Anaphylaxis   Puffy eyes and lips   Toradol [ketorolac Tromethamine] Anaphylaxis   Puffy lips and eyes      Medication List      albuterol 108 (90 Base) MCG/ACT inhaler Commonly known as:  PROVENTIL HFA;VENTOLIN HFA Inhale 2 puffs into the lungs every 4 (four) hours as needed for wheezing or shortness of breath (cough).   CENTRUM SILVER ADULT 50+ PO Take 1 tablet by mouth daily.   cetirizine 10 MG tablet Commonly known as:  ZYRTEC Take 10 mg by mouth daily as needed for allergies.   Cranberry 450 MG Caps Take 450 mg by mouth daily.   D-3-5 5000 units capsule Generic drug:  Cholecalciferol Take 5,000 Units by  mouth daily.   EPINEPHrine 0.3 mg/0.3 mL Soaj injection Commonly known as:  EPI-PEN Inject 0.3 mLs (0.3 mg total) into the muscle once as needed. Reported on 01/20/2015   Fish Oil 1000 MG Caps Take 1 capsule by mouth daily.   fluticasone 50 MCG/ACT nasal spray Commonly known as:  FLONASE Use one to two sprays in each nostril once daily.   levothyroxine 100 MCG tablet Commonly known as:  SYNTHROID, LEVOTHROID Take 100 mcg by mouth daily before breakfast.   OSTEO BI-FLEX ADV TRIPLE ST PO Take 2 tablets by mouth daily.   pantoprazole 20 MG tablet Commonly known as:  PROTONIX Take 20 mg by mouth daily.   SOLUBLE FIBER/PROBIOTICS PO Take 1 capsule by mouth daily. 20 billion   SUMAtriptan 25 MG tablet Commonly known as:  IMITREX Take 25 mg by mouth every 2 (two) hours as needed for migraine.   tamoxifen 20 MG tablet Commonly known as:  NOLVADEX TAKE 1 TABLET DAILY (START FEBRUARY 1ST)   TURMERIC CURCUMIN PO Take 1,000 mg by mouth daily.       Past Medical History:  Diagnosis Date  . Allergy   . Asthma   . Breast cancer of 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 breast ([place]) 11/13/2014  . GERD (gastroesophageal reflux disease)   . H/O bilateral inguinal hernia repair 1992  . Hypercholesteremia   . Migraines   . Personal history of radiation therapy   . Plantar fasciitis, bilateral   .  Radiation 01/12/15-02/04/15   right breast 42.17 gray  . Spinal stenosis   . Thyroid disease     Past Surgical History:  Procedure Laterality Date  . BLADDER SUSPENSION    . BLEPHAROPLASTY Bilateral   . BREAST LUMPECTOMY Right 12/10/2014  . BREAST LUMPECTOMY WITH RADIOACTIVE SEED LOCALIZATION Right 12/10/2014   Procedure: RIGHT BREAST LUMPECTOMY WITH RADIOACTIVE SEED LOCALIZATION;  Surgeon: Autumn Messing III, MD;  Location: [facility];  Service: General;  Laterality: Right;  . FOOT SURGERY     plantar fashiectomies from both feet  . HERNIA REPAIR    . NASAL SEPTUM  SURGERY    . neuromas removed from right foot    . 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 removal of basal cell carcinoma (nose)    . TONSILECTOMY/ADENOIDECTOMY WITH MYRINGOTOMY    . TONSILLECTOMY      Review of systems negative except as noted in HPI / PMHx or noted below:  Review of Systems  Constitutional: Negative.   HENT: Negative.   Eyes: Negative.   Respiratory: Negative.   Cardiovascular: Negative.   Gastrointestinal: Negative.   Genitourinary: Negative.   Musculoskeletal: Negative.   Skin: Negative.   Neurological: Negative.   Endo/Heme/Allergies: Negative.   Psychiatric/Behavioral: Negative.      Objective:   Vitals:   10/24/17 1057  BP: 112/70  Pulse: 72  Resp: 18  SpO2: 97%   Height: 5' 4.5" (163.8 cm)  Weight: 148 lb (67.1 kg)   Physical Exam  HENT:  Head: Normocephalic.  Right Ear: Tympanic membrane, external ear and ear canal normal.  Left Ear: Tympanic membrane, external ear and ear canal normal.  Nose: Nose normal. No mucosal edema or rhinorrhea.  Mouth/Throat: Uvula is midline, oropharynx is clear and moist and mucous membranes are normal. No oropharyngeal exudate.  Eyes: Conjunctivae are normal.  Neck: Trachea normal. No tracheal tenderness present. No tracheal deviation present. No thyromegaly present.  Cardiovascular: Normal rate, regular rhythm, S1 normal, S2 normal and normal heart sounds.  No murmur heard. Pulmonary/Chest: Breath sounds normal. No stridor. No respiratory distress. She has no wheezes. She has no rales.  Musculoskeletal: She exhibits no edema.  Lymphadenopathy:       Head (right side): No tonsillar adenopathy present.       Head (left side): No tonsillar adenopathy present.    She has no cervical adenopathy.  Neurological: She is alert.  Skin: No rash noted. She is not diaphoretic. No erythema. Nails show no clubbing.    Diagnostics:    Spirometry was performed and demonstrated an FEV1 of 2.29 at 96 % of  predicted.  The patient had an Asthma Control Test with the following results: ACT Total Score: 25.    Assessment and Plan:   1. Asthma, mild intermittent, well-controlled   2. Other allergic rhinitis   3. Hymenoptera allergy   4. LPRD (laryngopharyngeal reflux disease)     1. Continue to treat reflux:   A. Protonix 20 mg 3-7 times a week  2. Continue the following if needed:   A. Zyrtec 10 mg one tablet once a day  B. Mucinex  C. nasal saline / gel  D. Ventolin HFA 2 puffs every 4-6 hours  E. EpiPen  3. Obtain fall flu vaccine     4. Return to clinic in 6 months or earlier if problem  Jamal appears to be doing very well on her current therapy.  I have asked her to see  if she can consolidate some of her proton pump inhibitor by using this medication on a Monday Wednesday and Friday schedule.  If she continues to do well I will see her back in this clinic in 6 months or earlier if there is a problem.  Allena Katz, MD Allergy / Immunology [place]

## 2017-10-25 ENCOUNTER — Encounter: Payer: Self-pay | Admitting: Allergy and Immunology

## 2017-11-02 DIAGNOSIS — E559 Vitamin D deficiency, unspecified: Secondary | ICD-10-CM | POA: Diagnosis not present

## 2017-11-02 DIAGNOSIS — E039 Hypothyroidism, unspecified: Secondary | ICD-10-CM | POA: Diagnosis not present

## 2017-11-02 DIAGNOSIS — R5383 Other fatigue: Secondary | ICD-10-CM | POA: Diagnosis not present

## 2017-11-02 DIAGNOSIS — R109 Unspecified abdominal pain: Secondary | ICD-10-CM | POA: Diagnosis not present

## 2017-11-02 DIAGNOSIS — Z23 Encounter for immunization: Secondary | ICD-10-CM | POA: Diagnosis not present

## 2017-11-02 DIAGNOSIS — E7800 Pure hypercholesterolemia, unspecified: Secondary | ICD-10-CM | POA: Diagnosis not present

## 2017-11-02 DIAGNOSIS — F419 Anxiety disorder, unspecified: Secondary | ICD-10-CM | POA: Diagnosis not present

## 2017-11-02 DIAGNOSIS — K219 Gastro-esophageal reflux disease without esophagitis: Secondary | ICD-10-CM | POA: Diagnosis not present

## 2017-11-06 DIAGNOSIS — D2371 Other benign neoplasm of skin of right lower limb, including hip: Secondary | ICD-10-CM | POA: Diagnosis not present

## 2017-11-06 DIAGNOSIS — M79671 Pain in right foot: Secondary | ICD-10-CM | POA: Diagnosis not present

## 2017-11-06 DIAGNOSIS — M21621 Bunionette of right foot: Secondary | ICD-10-CM | POA: Diagnosis not present

## 2017-11-06 DIAGNOSIS — M79672 Pain in left foot: Secondary | ICD-10-CM | POA: Diagnosis not present

## 2017-11-13 DIAGNOSIS — M722 Plantar fascial fibromatosis: Secondary | ICD-10-CM | POA: Diagnosis not present

## 2017-11-13 DIAGNOSIS — M19041 Primary osteoarthritis, right hand: Secondary | ICD-10-CM | POA: Diagnosis not present

## 2017-11-13 DIAGNOSIS — M2550 Pain in unspecified joint: Secondary | ICD-10-CM | POA: Diagnosis not present

## 2017-11-13 DIAGNOSIS — M19042 Primary osteoarthritis, left hand: Secondary | ICD-10-CM | POA: Diagnosis not present

## 2017-11-13 DIAGNOSIS — Z6824 Body mass index (BMI) 24.0-24.9, adult: Secondary | ICD-10-CM | POA: Diagnosis not present

## 2017-11-20 ENCOUNTER — Ambulatory Visit
Admission: RE | Admit: 2017-11-20 | Discharge: 2017-11-20 | Disposition: A | Discharge status: Home / Self Care | Payer: Medicare Other | Source: Ambulatory Visit | Attending: Hematology and Oncology | Admitting: Hematology and Oncology

## 2017-11-20 DIAGNOSIS — Z853 Personal history of malignant neoplasm of breast: Secondary | ICD-10-CM

## 2017-11-21 ENCOUNTER — Telehealth: Payer: Self-pay

## 2017-11-21 NOTE — Telephone Encounter (Signed)
Patient left voicemail stating she is having a lot of vaginal discharge with abdominal fullness and cramping.  Returned patient's call.  No answer, left contact information for patient to return call.

## 2017-11-22 ENCOUNTER — Telehealth: Payer: Self-pay

## 2017-11-22 NOTE — Telephone Encounter (Signed)
Pt called to return nurse call regarding advice on vaginal bleeding with tamoxifen. Pt states that her vaginal bleeding stopped, but still has pelvic heaviness and cramping that is gradually getting worse over the last 3 day. Advised that pt stop taking tamoxifen until she sees her gynecologist. Will notify Dr.Gudena so that he will be aware of pt stopping tamoxifen. Pt advised to follow up and call our office once she has had her GYN appt. Pt verbalized understanding and has no further needs at this time.

## 2017-11-27 DIAGNOSIS — Z79810 Long term (current) use of selective estrogen receptor modulators (SERMs): Secondary | ICD-10-CM | POA: Diagnosis not present

## 2017-11-27 DIAGNOSIS — Z780 Asymptomatic menopausal state: Secondary | ICD-10-CM | POA: Diagnosis not present

## 2017-11-29 DIAGNOSIS — Z1211 Encounter for screening for malignant neoplasm of colon: Secondary | ICD-10-CM | POA: Diagnosis not present

## 2017-11-29 DIAGNOSIS — R1031 Right lower quadrant pain: Secondary | ICD-10-CM | POA: Diagnosis not present

## 2017-12-08 ENCOUNTER — Telehealth: Payer: Self-pay | Admitting: Allergy and Immunology

## 2017-12-08 NOTE — Telephone Encounter (Signed)
Pt called to make sure we are billing the credit ones. Because she received a letter from her old ins said we bill them apwu. [phone].

## 2017-12-11 NOTE — Telephone Encounter (Signed)
We received an EOB stating that her insurance was no longer in effect and Valerie Snyder corrected it & filed to the correct secondary.  Left message with patient explaining this - kt

## 2017-12-13 DIAGNOSIS — M21622 Bunionette of left foot: Secondary | ICD-10-CM | POA: Diagnosis not present

## 2017-12-13 DIAGNOSIS — M79672 Pain in left foot: Secondary | ICD-10-CM | POA: Diagnosis not present

## 2017-12-13 DIAGNOSIS — D2371 Other benign neoplasm of skin of right lower limb, including hip: Secondary | ICD-10-CM | POA: Diagnosis not present

## 2017-12-13 DIAGNOSIS — M79671 Pain in right foot: Secondary | ICD-10-CM | POA: Diagnosis not present

## 2017-12-14 DIAGNOSIS — Z01818 Encounter for other preprocedural examination: Secondary | ICD-10-CM | POA: Diagnosis not present

## 2017-12-14 DIAGNOSIS — N950 Postmenopausal bleeding: Secondary | ICD-10-CM | POA: Diagnosis not present

## 2017-12-14 DIAGNOSIS — R1031 Right lower quadrant pain: Secondary | ICD-10-CM | POA: Diagnosis not present

## 2017-12-19 ENCOUNTER — Encounter (HOSPITAL_BASED_OUTPATIENT_CLINIC_OR_DEPARTMENT_OTHER): Payer: Self-pay | Admitting: *Deleted

## 2017-12-19 ENCOUNTER — Other Ambulatory Visit: Payer: Self-pay

## 2017-12-19 NOTE — Progress Notes (Signed)
Spoke with Myrene at Dr. Annie Main office to verify if BMP and CBC need to be drawn. Patient already had labs drawn on 11/02/17 at Dr. Ane Payment office. Awaiting callback.

## 2017-12-21 NOTE — Progress Notes (Signed)
Pt called to see if she did need to come in for PAT appt. No orders in for labs so no appt needed. Pt states that she will bring her recent labwork and EKG with her on the dos.

## 2017-12-22 NOTE — H&P (Signed)
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  In review, she noted brown then bright red blood about a month ago. This lasted for a few days and then resolved on its own. Pt spoke with oncologist and advised to cut back on Tamoxifen until further work up. She reports no further bleeding- denies abnormal discharge, itching or irritation. Still have dyspareunia and vaginal dryness and strongly considering Josph Macho.  Last seen for this issue April 2019- work up completed including [place] and EMB (see below). Due to recurrent bleeding and medical history- plan for further surgical intervention.  05/2017 [place]: 7cm anteverted uterus with thickened lining measuring 2.07cm- echogenic with multiple tiny cystic areas winithin cavity, no BF noted. Normal overias bilaterally.  (Prior US 2016- endometrial linig 0.17cm) 05/2017: EMB atrophic endometrium, no hyperplasia or malignancy.    Current Medications  Taking   Acetaminophen 500 MG Capsule 1 capsule as needed Orally every 6 hrs, Notes: prn   Calcium 500 MG Tablet Orally twice a day, Notes: OTC   Centrum Silver - Tablet 1 tablet Orally , Notes: OTC   Cetirizine HCl 10 MG Tablet 1 tablet Orally Once a day, Notes: as needed   Cranberry Extract 250 MG Tablet Orally   EpiPen 2-Pak(EPINEPHrine) 0.3 MG/0.3ML Solution Auto-injector Injection , Notes: as needed   Fish Oil 1000 MG Capsule 1 capsule Orally Once a day, Notes: OTC   Fluticasone Propionate 50 MCG/ACT Suspension 1 spray in each nostril Nasally Once a day prn, Notes: as needed   Osteo Bi-Flex Triple Strength - Tablet Orally   Sumatriptan Succinate 25 MG Tablet 1 tablet as needed Orally once daily as needed   Turmeric Curcumin 1000 mg 1 tablet once a day, Notes: OTC   Levothyroxine Sodium 100 MCG Tablet TAKE 1 TABLET DAILY   Probiotic - Tablet Delayed Release 1 tablet Orally once a day   Vitamin D3 5000 UNIT Tablet Orally , Notes: OTC    Levothyroxine Sodium 100 MCG Tablet 1 tablet on an empty stomach in the morning Orally Once a day   Pantoprazole Sodium 20 MG Tablet Delayed Release 1 tablet Orally Once a day   Celecoxib 200 mg q 12 prn #60 200 mg Tablet one tablet orally every 12 hours as needed for pain   Tamoxifen Citrate 10 MG Tablet 1 tablet Orally Twice a day, Notes: pt has stopped taking this medication   Medication List reviewed and reconciled with the patient    Past Medical History  Hypothyroidism.   Hypercholesterolemia.   GERD, hiatal hernia.   Migraine with aura.   allergies, allergy-induced asthma, and gets allergy shots (Dr. Ishmael Holter).   Plantar fasciitis, fallen metatarsal head on the right.   Familial idiopathic hematuria.   Spinal stenosis.   History of kidney stones.   History of inguinal hernia repair.   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 Trudie Reed) .   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   Depression.   Hemorrhoids.   Rectal bleeding.   extra PVCs.   Trigeminal neuralgia.   History of basal cell carcinoma of the nose.   anxiety (Claudia McCoy at [employer]).   vitamin D deficiency .    Surgical History  tonsillectomy 1954  rhinoplasty- deviated septum/broken nose 1977  benign osteoma from (R) orbital 1991  plantar fasciectomies 2006 & 2008  bladder lift w/ TVT sling and vaginal hernia repair 2010  two neuromas from (R) foot 2010  bilateral ptosis repair and blepharoplasty 2015  basal cell carcinoma removed from nose 2015  lumpectomy- right breast DCIS 12/10/2014  Colon/EGD 09/2015   Family History  Father: deceased 70 yrs, Dementia, Spinal stenosis, lewy-body syndrome, osteoarthritis, colon polyps, diagnosed with CVA  Mother: deceased 62 yrs, lung disease, MI, colon polyps, Coronary artery disease, CHF (congestive heart failure)  Daughter(s): alive 29 yrs, hypothyroid, colon polyps  Son(s): alive 79 yrs  Spouse: alive, Parkinsons  2  brother(s) , 5 sister(s) .   paternal cousin liver disease\\nneg for colon cancer.   Social History  General:  no Alcohol.  Children: 2.  no Caffeine.  DIET: good.  Tobacco use  cigarettes: Never smoked Tobacco history last updated 05/09/2017 Marital Status: married.  no Recreational drug use.  OCCUPATION: retired, Pharmacist, hospital.  Exercise: yes.    Gyn History  Sexual activity currently sexually active.  Periods : postmenopausal.  LMP 2000.  Denies H/O Birth control.  Last pap smear date 2014 Dr. Arline Asp in [place], [place].  Last mammogram date 10/2016.  Denies H/O Abnormal pap smear.  Denies H/O STD.    OB History  Pregnancy # 1 live birth, vaginal delivery.  Pregnancy # 2 live birth, vaginal delivery.    Allergies  Keflex: facial swelling - Allergy  toradol: anaphylaxis  Crestor: muscle cramps - Side Effects  Atorvastatin Calcium: muscle cramps - Side Effects  Simvastatin: muscle cramps - Side Effects   Hospitalization/Major Diagnostic Procedure  childbirth 1971 & 1976   Review of Systems  CONSTITUTIONAL:  no Appetite changes. Chills yes, at night. Fatigue yes. no Fever.  CARDIOLOGY:  no Chest pain.  RESPIRATORY:  no Shortness of breath. no Cough.  UROLOGY:  no Pain with urination. Urinary urgency only rare occurence. no Urinary frequency. no Urinary incontinence.  GASTROENTEROLOGY:  no Abdominal pain. no Change in bowel habits. no Change in bowel movements.  FEMALE REPRODUCTIVE:  See HPI for details. no Breast lumps or discharge. no Breast pain. no Breast tenderness. no Hot flashes. no Vaginal irritation. no Vaginal itching.  NEUROLOGY:  no Dizziness. Headache yes.  PSYCHOLOGY:  no Anxiety. no Depression.  SKIN:  no Rash. no Hives.  HEMATOLOGY/LYMPH:  no Anemia. Using Blood Thinners no.     Vital Signs  Wt 149.0, Ht 64.5, BMI 25.18, Pulse sitting 80, BP sitting 112/70.   Examination  General Examination: CONSTITUTIONAL: well developed, well  nourished.  SKIN: warm and dry, no rashes.  NECK: supple, normal appearance.  LUNGS: regular breathing rate and effort.  HEART: no murmurs, regular rate and rhythm.  ABDOMEN: soft and not tender, no masses palpated, no rebound, no rigidity.  FEMALE GENITOURINARY: pt declined exam today.  MUSCULOSKELETAL no calf tenderness bilaterally.  EXTREMITIES: no edema present.  PSYCH: appropriate mood and affect.     A/P: 67yo PM female who presents for hysteroscopy, D&C -NPO -LR @ 125cc/hr -SCDs to OR -Risk/benefit and alternatives reviewed with patient including but not limited to risk of bleeding, infection, uterine perforation and injury to surrounding organs.  Questions and concerns were addressed and she desires to proceed  Janyth Pupa, DO [phone] (cell) [phone] (office)

## 2017-12-27 ENCOUNTER — Ambulatory Visit (HOSPITAL_BASED_OUTPATIENT_CLINIC_OR_DEPARTMENT_OTHER): Payer: Medicare Other | Admitting: Anesthesiology

## 2017-12-27 ENCOUNTER — Encounter (HOSPITAL_BASED_OUTPATIENT_CLINIC_OR_DEPARTMENT_OTHER)
Admission: RE | Disposition: A | Discharge status: Home / Self Care | Payer: Self-pay | Source: Ambulatory Visit | Attending: Obstetrics & Gynecology

## 2017-12-27 ENCOUNTER — Other Ambulatory Visit: Payer: Self-pay

## 2017-12-27 ENCOUNTER — Encounter (HOSPITAL_BASED_OUTPATIENT_CLINIC_OR_DEPARTMENT_OTHER): Payer: Self-pay | Admitting: Emergency Medicine

## 2017-12-27 ENCOUNTER — Ambulatory Visit (HOSPITAL_BASED_OUTPATIENT_CLINIC_OR_DEPARTMENT_OTHER)
Admission: RE | Admit: 2017-12-27 | Discharge: 2017-12-27 | Disposition: A | Discharge status: Home / Self Care | Payer: Medicare Other | Source: Ambulatory Visit | Attending: Obstetrics & Gynecology | Admitting: Obstetrics & Gynecology

## 2017-12-27 DIAGNOSIS — I493 Ventricular premature depolarization: Secondary | ICD-10-CM | POA: Diagnosis not present

## 2017-12-27 DIAGNOSIS — Z823 Family history of stroke: Secondary | ICD-10-CM | POA: Diagnosis not present

## 2017-12-27 DIAGNOSIS — F419 Anxiety disorder, unspecified: Secondary | ICD-10-CM | POA: Diagnosis not present

## 2017-12-27 DIAGNOSIS — G500 Trigeminal neuralgia: Secondary | ICD-10-CM | POA: Diagnosis not present

## 2017-12-27 DIAGNOSIS — Z86000 Personal history of in-situ neoplasm of breast: Secondary | ICD-10-CM | POA: Insufficient documentation

## 2017-12-27 DIAGNOSIS — E559 Vitamin D deficiency, unspecified: Secondary | ICD-10-CM | POA: Insufficient documentation

## 2017-12-27 DIAGNOSIS — Z8371 Family history of colonic polyps: Secondary | ICD-10-CM | POA: Insufficient documentation

## 2017-12-27 DIAGNOSIS — Z8349 Family history of other endocrine, nutritional and metabolic diseases: Secondary | ICD-10-CM | POA: Diagnosis not present

## 2017-12-27 DIAGNOSIS — M1990 Unspecified osteoarthritis, unspecified site: Secondary | ICD-10-CM | POA: Insufficient documentation

## 2017-12-27 DIAGNOSIS — F329 Major depressive disorder, single episode, unspecified: Secondary | ICD-10-CM | POA: Diagnosis not present

## 2017-12-27 DIAGNOSIS — Z8269 Family history of other diseases of the musculoskeletal system and connective tissue: Secondary | ICD-10-CM | POA: Diagnosis not present

## 2017-12-27 DIAGNOSIS — K219 Gastro-esophageal reflux disease without esophagitis: Secondary | ICD-10-CM | POA: Insufficient documentation

## 2017-12-27 DIAGNOSIS — Z820 Family history of epilepsy and other diseases of the nervous system: Secondary | ICD-10-CM | POA: Insufficient documentation

## 2017-12-27 DIAGNOSIS — Z881 Allergy status to other antibiotic agents status: Secondary | ICD-10-CM | POA: Insufficient documentation

## 2017-12-27 DIAGNOSIS — G43909 Migraine, unspecified, not intractable, without status migrainosus: Secondary | ICD-10-CM | POA: Insufficient documentation

## 2017-12-27 DIAGNOSIS — E7800 Pure hypercholesterolemia, unspecified: Secondary | ICD-10-CM | POA: Insufficient documentation

## 2017-12-27 DIAGNOSIS — N840 Polyp of corpus uteri: Secondary | ICD-10-CM | POA: Diagnosis not present

## 2017-12-27 DIAGNOSIS — Z79899 Other long term (current) drug therapy: Secondary | ICD-10-CM | POA: Diagnosis not present

## 2017-12-27 DIAGNOSIS — M4800 Spinal stenosis, site unspecified: Secondary | ICD-10-CM | POA: Insufficient documentation

## 2017-12-27 DIAGNOSIS — J45909 Unspecified asthma, uncomplicated: Secondary | ICD-10-CM | POA: Insufficient documentation

## 2017-12-27 DIAGNOSIS — N950 Postmenopausal bleeding: Secondary | ICD-10-CM | POA: Insufficient documentation

## 2017-12-27 DIAGNOSIS — K449 Diaphragmatic hernia without obstruction or gangrene: Secondary | ICD-10-CM | POA: Diagnosis not present

## 2017-12-27 DIAGNOSIS — Z87442 Personal history of urinary calculi: Secondary | ICD-10-CM | POA: Insufficient documentation

## 2017-12-27 DIAGNOSIS — Z8249 Family history of ischemic heart disease and other diseases of the circulatory system: Secondary | ICD-10-CM | POA: Diagnosis not present

## 2017-12-27 DIAGNOSIS — E039 Hypothyroidism, unspecified: Secondary | ICD-10-CM | POA: Diagnosis not present

## 2017-12-27 HISTORY — PX: HYSTEROSCOPY WITH D & C: SHX1775

## 2017-12-27 HISTORY — DX: Adverse effect of unspecified anesthetic, initial encounter: T41.45XA

## 2017-12-27 HISTORY — DX: Other complications of anesthesia, initial encounter: T88.59XA

## 2017-12-27 HISTORY — DX: Concussion with loss of consciousness of unspecified duration, initial encounter: S06.0X9A

## 2017-12-27 HISTORY — PX: POLYPECTOMY: SHX5525

## 2017-12-27 HISTORY — DX: Hypothyroidism, unspecified: E03.9

## 2017-12-27 SURGERY — DILATATION AND CURETTAGE /HYSTEROSCOPY
Anesthesia: General | Site: Uterus

## 2017-12-27 MED ORDER — PROPOFOL 10 MG/ML IV BOLUS
INTRAVENOUS | Status: AC
  Filled 2017-12-27: qty 20

## 2017-12-27 MED ORDER — FENTANYL CITRATE (PF) 100 MCG/2ML IJ SOLN
INTRAMUSCULAR | Status: AC
  Filled 2017-12-27: qty 2

## 2017-12-27 MED ORDER — SCOPOLAMINE 1 MG/3DAYS TD PT72: 1.0000 | MEDICATED_PATCH | Freq: Once | TRANSDERMAL | Status: DC | PRN

## 2017-12-27 MED ORDER — MEPERIDINE HCL 25 MG/ML IJ SOLN: 6.2500 mg | INTRAMUSCULAR | Status: DC | PRN

## 2017-12-27 MED ORDER — LIDOCAINE HCL (CARDIAC) PF 100 MG/5ML IV SOSY
PREFILLED_SYRINGE | INTRAVENOUS | Status: DC | PRN
  Administered 2017-12-27: 50 mg via INTRAVENOUS

## 2017-12-27 MED ORDER — LACTATED RINGERS IV SOLN
INTRAVENOUS | Status: DC
  Administered 2017-12-27: 10:00:00 via INTRAVENOUS

## 2017-12-27 MED ORDER — MIDAZOLAM HCL 2 MG/2ML IJ SOLN
INTRAMUSCULAR | Status: AC
  Filled 2017-12-27: qty 2

## 2017-12-27 MED ORDER — MIDAZOLAM HCL 2 MG/2ML IJ SOLN
1.0000 mg | INTRAMUSCULAR | Status: DC | PRN
  Administered 2017-12-27: 2 mg via INTRAVENOUS

## 2017-12-27 MED ORDER — DEXAMETHASONE SODIUM PHOSPHATE 4 MG/ML IJ SOLN
INTRAMUSCULAR | Status: DC | PRN
  Administered 2017-12-27: 10 mg via INTRAVENOUS

## 2017-12-27 MED ORDER — ROCURONIUM BROMIDE 50 MG/5ML IV SOSY
PREFILLED_SYRINGE | INTRAVENOUS | Status: AC
  Filled 2017-12-27: qty 5

## 2017-12-27 MED ORDER — SUCCINYLCHOLINE CHLORIDE 200 MG/10ML IV SOSY
PREFILLED_SYRINGE | INTRAVENOUS | Status: AC
  Filled 2017-12-27: qty 10

## 2017-12-27 MED ORDER — HYDROCODONE-ACETAMINOPHEN 7.5-325 MG PO TABS: 1.0000 | ORAL_TABLET | Freq: Once | ORAL | Status: DC | PRN

## 2017-12-27 MED ORDER — LIDOCAINE 2% (20 MG/ML) 5 ML SYRINGE
INTRAMUSCULAR | Status: AC
  Filled 2017-12-27: qty 5

## 2017-12-27 MED ORDER — KETOROLAC TROMETHAMINE 30 MG/ML IJ SOLN
INTRAMUSCULAR | Status: AC
  Filled 2017-12-27: qty 1

## 2017-12-27 MED ORDER — FENTANYL CITRATE (PF) 100 MCG/2ML IJ SOLN
50.0000 ug | INTRAMUSCULAR | Status: DC | PRN
  Administered 2017-12-27: 100 ug via INTRAVENOUS

## 2017-12-27 MED ORDER — DEXAMETHASONE SODIUM PHOSPHATE 10 MG/ML IJ SOLN
INTRAMUSCULAR | Status: AC
  Filled 2017-12-27: qty 1

## 2017-12-27 MED ORDER — LACTATED RINGERS IV SOLN
INTRAVENOUS | Status: DC
  Administered 2017-12-27: 11:00:00 via INTRAVENOUS

## 2017-12-27 MED ORDER — LIDOCAINE-EPINEPHRINE 1 %-1:100000 IJ SOLN
INTRAMUSCULAR | Status: DC | PRN
  Administered 2017-12-27: 20 mL

## 2017-12-27 MED ORDER — ONDANSETRON HCL 4 MG/2ML IJ SOLN: 4.0000 mg | Freq: Once | INTRAMUSCULAR | Status: DC | PRN

## 2017-12-27 MED ORDER — FENTANYL CITRATE (PF) 100 MCG/2ML IJ SOLN: 25.0000 ug | INTRAMUSCULAR | Status: DC | PRN

## 2017-12-27 MED ORDER — EPHEDRINE 5 MG/ML INJ
INTRAVENOUS | Status: AC
  Filled 2017-12-27: qty 10

## 2017-12-27 MED ORDER — PROPOFOL 10 MG/ML IV BOLUS
INTRAVENOUS | Status: DC | PRN
  Administered 2017-12-27: 150 mg via INTRAVENOUS

## 2017-12-27 MED ORDER — ONDANSETRON HCL 4 MG/2ML IJ SOLN
INTRAMUSCULAR | Status: AC
  Filled 2017-12-27: qty 2

## 2017-12-27 SURGICAL SUPPLY — 18 items
BRIEF STRETCH FOR OB PAD XXL (UNDERPADS AND DIAPERS) ×3 IMPLANT
CANISTER SUCT 3000ML PPV (MISCELLANEOUS) ×3 IMPLANT
CATH ROBINSON RED A/P 16FR (CATHETERS) ×3 IMPLANT
DEVICE MYOSURE REACH (MISCELLANEOUS) ×3 IMPLANT
DILATOR CANAL MILEX (MISCELLANEOUS) IMPLANT
GLOVE BIOGEL PI IND STRL 6.5 (GLOVE) ×2 IMPLANT
GLOVE BIOGEL PI IND STRL 7.0 (GLOVE) ×2 IMPLANT
GLOVE BIOGEL PI INDICATOR 6.5 (GLOVE) ×1
GLOVE BIOGEL PI INDICATOR 7.0 (GLOVE) ×1
GLOVE ECLIPSE 6.5 STRL STRAW (GLOVE) ×3 IMPLANT
GOWN STRL REUS W/TWL LRG LVL3 (GOWN DISPOSABLE) ×6 IMPLANT
KIT PROCEDURE FLUENT (KITS) ×3 IMPLANT
PACK VAGINAL MINOR WOMEN LF (CUSTOM PROCEDURE TRAY) ×3 IMPLANT
PAD OB MATERNITY 4.3X12.25 (PERSONAL CARE ITEMS) ×3 IMPLANT
PAD PREP 24X48 CUFFED NSTRL (MISCELLANEOUS) ×3 IMPLANT
SEAL ROD LENS SCOPE MYOSURE (ABLATOR) ×3 IMPLANT
SLEEVE SCD COMPRESS KNEE MED (MISCELLANEOUS) ×3 IMPLANT
TOWEL GREEN STERILE FF (TOWEL DISPOSABLE) ×3 IMPLANT

## 2017-12-27 NOTE — Transfer of Care (Signed)
Immediate Anesthesia Transfer of Care Note  Patient: Valerie Snyder  Procedure(s) Performed: DILATATION AND CURETTAGE /HYSTEROSCOPY (N/A Uterus) POLYPECTOMY (Uterus)  Patient Location: PACU  Anesthesia Type:General  Level of Consciousness: sedated  Airway & Oxygen Therapy: Patient Spontanous Breathing and Patient connected to face mask oxygen  Post-op Assessment: Report given to RN and Post -op Vital signs reviewed and stable  Post vital signs: Reviewed and stable  Last Vitals:  Vitals Value Taken Time  BP    Temp    Pulse 88 12/27/2017 12:17 PM  Resp 14 12/27/2017 12:17 PM  SpO2 98 % 12/27/2017 12:17 PM  Vitals shown include unvalidated device data.  Last Pain:  Vitals:   12/27/17 1032  TempSrc: Oral  PainSc: 0-No pain         Complications: No apparent anesthesia complications

## 2017-12-27 NOTE — Anesthesia Postprocedure Evaluation (Signed)
Anesthesia Post Note  Patient: Magazine features editor  Procedure(s) Performed: DILATATION AND CURETTAGE /HYSTEROSCOPY (N/A Uterus) POLYPECTOMY (Uterus)     Patient location during evaluation: PACU Anesthesia Type: General Level of consciousness: awake and alert and oriented Pain management: pain level controlled Vital Signs Assessment: post-procedure vital signs reviewed and stable Respiratory status: spontaneous breathing, nonlabored ventilation and respiratory function stable Cardiovascular status: blood pressure returned to baseline and stable Postop Assessment: no apparent nausea or vomiting Anesthetic complications: no    Last Vitals:  Vitals:   12/27/17 1216 12/27/17 1230  BP: (!) 97/52 (!) 106/58  Pulse: 89 86  Resp: 16 13  Temp: (!) 36.4 C   SpO2: 100% 100%    Last Pain:  Vitals:   12/27/17 1230  TempSrc:   PainSc: 0-No pain                 Froylan Hobby A.

## 2017-12-27 NOTE — Op Note (Signed)
Operative Report  PreOp: Postmenopausal bleeding PostOp: same and Uterine polyp Procedure:  Hysteroscopy, Dilation and Curettage, myosure polypectomy Surgeon: Dr. Janyth Pupa Anesthesia: General Complications:none EBL: 20SH IVF:700cc Discrepancy: 120cc  Findings:7cm uterus with 2 polyps- small ~ 0.5cm polyp right fundal and large ~ 3cm polyp left wall  Specimens: uterine polyp and endometrial curettings   Procedure: The patient was taken to the operating room where she underwent general anesthesia without difficulty. The patient was placed in a low lithotomy position using Allen stirrups. The patient was examined with the findings as noted above.  She was then prepped and draped in the normal sterile fashion. A sterile speculum was inserted into the vagina. 20cc of Lidocaine with epinephrine was used for a cervical polyp  A single tooth tenaculum was placed on the anterior lip of the cervix. The uterus was then sounded to 7cm. The endocervical canal was then serially dilated to 17 using Hank dilators.  The diagnostic hysteroscope was then inserted without difficulty and noted to have the findings as listed above. The myosure was inserted and resection of both polyps was completed.  The hysteroscope was removed and sharp curettage was performed. The tissue was sent to pathology. The hysteroscope was reinserted and no uterine perforation was seen, both ostia visualized.  All instrument were then removed. Hemostasis was observed at the cervical site.  The patient was repositioned to the supine position. The patient tolerated the procedure without any complications and taken to recovery in stable condition.   Janyth Pupa, DO [phone] (pager) [phone] (office)

## 2017-12-27 NOTE — Anesthesia Procedure Notes (Signed)
Procedure Name: LMA Insertion Date/Time: 12/27/2017 11:44 AM Performed by: Willa Frater, CRNA Pre-anesthesia Checklist: Patient identified, Emergency Drugs available, Suction available and Patient being monitored Patient Re-evaluated:Patient Re-evaluated prior to induction Oxygen Delivery Method: Circle system utilized Preoxygenation: Pre-oxygenation with 100% oxygen Induction Type: IV induction Ventilation: Mask ventilation without difficulty LMA: LMA inserted LMA Size: 4.0 Number of attempts: 1 Airway Equipment and Method: Bite block Placement Confirmation: positive ETCO2 Tube secured with: Tape Dental Injury: Teeth and Oropharynx as per pre-operative assessment

## 2017-12-27 NOTE — Anesthesia Preprocedure Evaluation (Addendum)
Anesthesia Evaluation  Patient identified by MRN, date of birth, ID band Patient awake    Reviewed: Allergy & Precautions, NPO status , Patient's Chart, lab work & pertinent test results  History of Anesthesia Complications (+) PROLONGED EMERGENCE and history of anesthetic complications  Airway Mallampati: II  TM Distance: >3 FB Neck ROM: Full    Dental no notable dental hx. (+) Teeth Intact, Caps   Pulmonary asthma ,    Pulmonary exam normal breath sounds clear to auscultation       Cardiovascular negative cardio ROS Normal cardiovascular exam Rhythm:Regular Rate:Normal     Neuro/Psych  Headaches, negative psych ROS   GI/Hepatic Neg liver ROS, GERD  Medicated and Controlled,  Endo/Other  Hypothyroidism   Renal/GU negative Renal ROS  negative genitourinary   Musculoskeletal negative musculoskeletal ROS (+)   Abdominal   Peds  Hematology negative hematology ROS (+)   Anesthesia Other Findings   Reproductive/Obstetrics PMB Hx/o Ductal CIS right breast Tamoxifen therapy                            Anesthesia Physical Anesthesia Plan  ASA: II  Anesthesia Plan: General   Post-op Pain Management:    Induction: Intravenous  PONV Risk Score and Plan: 4 or greater and Ondansetron, Dexamethasone and Treatment may vary due to age or medical condition  Airway Management Planned: LMA  Additional Equipment:   Intra-op Plan:   Post-operative Plan: Extubation in OR  Informed Consent: I have reviewed the patients History and Physical, chart, labs and discussed the procedure including the risks, benefits and alternatives for the proposed anesthesia with the patient or authorized representative who has indicated his/her understanding and acceptance.   Dental advisory given  Plan Discussed with: Surgeon and CRNA  Anesthesia Plan Comments:         Anesthesia Quick Evaluation

## 2017-12-27 NOTE — Interval H&P Note (Signed)
History and Physical Interval Note:  12/27/2017 10:35 AM  Valerie Snyder  has presented today for surgery, with the diagnosis of Z78.0 Postmenopausal Z79.810 Use of tamoxifen Z86.000 History of ductal carcinoma in situ of breast  The various methods of treatment have been discussed with the patient and family. After consideration of risks, benefits and other options for treatment, the patient has consented to  Procedure(s): DILATATION AND CURETTAGE /HYSTEROSCOPY (N/A) as a surgical intervention .  The patient's history has been reviewed, patient examined, no change in status, stable for surgery.  I have reviewed the patient's chart and labs.  Questions were answered to the patient's satisfaction.     Annalee Genta

## 2017-12-27 NOTE — Discharge Instructions (Addendum)
°  Post Anesthesia Home Care Instructions  Activity: Get plenty of rest for the remainder of the day. A responsible individual must stay with you for 24 hours following the procedure.  For the next 24 hours, DO NOT: -Drive a car -Paediatric nurse -Drink alcoholic beverages -Take any medication unless instructed by your physician -Make any legal decisions or sign important papers.  Meals: Start with liquid foods such as gelatin or soup. Progress to regular foods as tolerated. Avoid greasy, spicy, heavy foods. If nausea and/or vomiting occur, drink only clear liquids until the nausea and/or vomiting subsides. Call your physician if vomiting continues.  Special Instructions/Symptoms: Your throat may feel dry or sore from the anesthesia or the breathing tube placed in your throat during surgery. If this causes discomfort, gargle with warm salt water. The discomfort should disappear within 24 hours.  If you had a scopolamine patch placed behind your ear for the management of post- operative nausea and/or vomiting:  1. The medication in the patch is effective for 72 hours, after which it should be removed.  Wrap patch in a tissue and discard in the trash. Wash hands thoroughly with soap and water. 2. You may remove the patch earlier than 72 hours if you experience unpleasant side effects which may include dry mouth, dizziness or visual disturbances. 3. Avoid touching the patch. Wash your hands with soap and water after contact with the patch.     HOME INSTRUCTIONS  Please note any unusual or excessive bleeding, pain, swelling. Mild dizziness or drowsiness are normal for about 24 hours after surgery.   Shower when comfortable  Restrictions: No driving for 24 hours or while taking pain medications.  Activity:  No heavy lifting (> 10 lbs), nothing in vagina (no tampons, douching, or intercourse) x 2 weeks; no tub baths for 2 weeks Vaginal spotting is expected but if your bleeding is heavy,  period like,  please call the office   Diet:  You may return to your regular diet.  Do not eat large meals.  Eat small frequent meals throughout the day.  Continue to drink a good amount of water at least 6-8 glasses of water per day, hydration is very important for the healing process.  Pain Management: Take Motrin and/or tylenol as needed for pain.  Always take prescription pain medication with food, it may cause constipation, increase fluids and fiber and you may want to take an over-the-counter stool softener like Colace as needed up to 2x a day.    Alcohol -- Avoid for 24 hours and while taking pain medications.  Nausea: Take sips of ginger ale or soda  Fever -- Call physician if temperature over 101 degrees  Follow up:  If you do not already have a follow up appointment scheduled, please call the office at [phone].  If you experience fever (a temperature greater than 100.4), pain unrelieved by pain medication, shortness of breath, swelling of a single leg, or any other symptoms which are concerning to you please the office immediately.

## 2018-01-01 ENCOUNTER — Encounter (HOSPITAL_BASED_OUTPATIENT_CLINIC_OR_DEPARTMENT_OTHER): Payer: Self-pay | Admitting: Obstetrics & Gynecology

## 2018-01-08 ENCOUNTER — Telehealth: Payer: Self-pay

## 2018-01-08 NOTE — Telephone Encounter (Signed)
Patient left voicemail inquiring about restarting Tamoxifen.  Patient has been seen by OB for previous issue of vaginal bleeding.    Nurse reviewed with Dr. Lindi Adie - Restart Tamoxifen 10mg  daily. Left voicemail for patient to call back.

## 2018-01-09 ENCOUNTER — Other Ambulatory Visit: Payer: Self-pay

## 2018-01-09 NOTE — Progress Notes (Signed)
Pt notifed that she may restart tamoxifen to 10mg  daily. Pt states that she is already taking 10mg  and will continue to take as instructed.No further needs at this time.

## 2018-02-08 DIAGNOSIS — M21622 Bunionette of left foot: Secondary | ICD-10-CM | POA: Diagnosis not present

## 2018-02-08 DIAGNOSIS — D2371 Other benign neoplasm of skin of right lower limb, including hip: Secondary | ICD-10-CM | POA: Diagnosis not present

## 2018-02-08 DIAGNOSIS — M79671 Pain in right foot: Secondary | ICD-10-CM | POA: Diagnosis not present

## 2018-02-08 DIAGNOSIS — M79672 Pain in left foot: Secondary | ICD-10-CM | POA: Diagnosis not present

## 2018-02-20 DIAGNOSIS — J069 Acute upper respiratory infection, unspecified: Secondary | ICD-10-CM | POA: Diagnosis not present

## 2018-03-16 DIAGNOSIS — M7742 Metatarsalgia, left foot: Secondary | ICD-10-CM | POA: Diagnosis not present

## 2018-03-16 DIAGNOSIS — M21622 Bunionette of left foot: Secondary | ICD-10-CM | POA: Diagnosis not present

## 2018-03-16 DIAGNOSIS — M7741 Metatarsalgia, right foot: Secondary | ICD-10-CM | POA: Diagnosis not present

## 2018-03-16 DIAGNOSIS — M21621 Bunionette of right foot: Secondary | ICD-10-CM | POA: Diagnosis not present

## 2018-03-22 ENCOUNTER — Other Ambulatory Visit: Payer: Self-pay

## 2018-03-22 MED ORDER — TAMOXIFEN CITRATE 10 MG PO TABS: 10.0000 mg | ORAL_TABLET | Freq: Two times a day (BID) | ORAL | 2 refills | Status: DC

## 2018-03-22 NOTE — Progress Notes (Signed)
  Pt called to request new prescription for tamoxifen. Pt now maintaining on 10mg , instead of 20mg  daily. Pt changed pharmacy due to insurance this year. Will send escript to [facility] mail order pharmacy.

## 2018-03-29 ENCOUNTER — Other Ambulatory Visit: Payer: Self-pay

## 2018-03-29 MED ORDER — TAMOXIFEN CITRATE 10 MG PO TABS: 10.0000 mg | ORAL_TABLET | Freq: Every day | ORAL | 2 refills | Status: DC
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
## 2018-04-05 ENCOUNTER — Telehealth: Payer: Self-pay | Admitting: Hematology and Oncology

## 2018-04-05 ENCOUNTER — Inpatient Hospital Stay: Payer: Medicare Other | Attending: Hematology and Oncology | Admitting: Hematology and Oncology

## 2018-04-05 DIAGNOSIS — Z79810 Long term (current) use of selective estrogen receptor modulators (SERMs): Secondary | ICD-10-CM | POA: Diagnosis not present

## 2018-04-05 DIAGNOSIS — M19042 Primary osteoarthritis, left hand: Secondary | ICD-10-CM | POA: Diagnosis not present

## 2018-04-05 DIAGNOSIS — M19041 Primary osteoarthritis, right hand: Secondary | ICD-10-CM | POA: Diagnosis not present

## 2018-04-05 DIAGNOSIS — Z923 Personal history of irradiation: Secondary | ICD-10-CM | POA: Insufficient documentation

## 2018-04-05 DIAGNOSIS — M79603 Pain in arm, unspecified: Secondary | ICD-10-CM | POA: Diagnosis not present

## 2018-04-05 DIAGNOSIS — Z170 Estrogen receptor positive status [ER+]: Secondary | ICD-10-CM | POA: Insufficient documentation

## 2018-04-05 DIAGNOSIS — F4310 Post-traumatic stress disorder, unspecified: Secondary | ICD-10-CM | POA: Diagnosis not present

## 2018-04-05 DIAGNOSIS — Z79899 Other long term (current) drug therapy: Secondary | ICD-10-CM | POA: Insufficient documentation

## 2018-04-05 DIAGNOSIS — E663 Overweight: Secondary | ICD-10-CM | POA: Diagnosis not present

## 2018-04-05 DIAGNOSIS — C50211 Malignant neoplasm of 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 breast: Secondary | ICD-10-CM | POA: Diagnosis not present

## 2018-04-05 DIAGNOSIS — F1310 Sedative, hypnotic or anxiolytic abuse, uncomplicated: Secondary | ICD-10-CM

## 2018-04-05 DIAGNOSIS — M722 Plantar fascial fibromatosis: Secondary | ICD-10-CM | POA: Diagnosis not present

## 2018-04-05 DIAGNOSIS — M2550 Pain in unspecified joint: Secondary | ICD-10-CM | POA: Diagnosis not present

## 2018-04-05 DIAGNOSIS — Z6825 Body mass index (BMI) 25.0-25.9, adult: Secondary | ICD-10-CM | POA: Diagnosis not present

## 2018-04-05 MED ORDER — PANTOPRAZOLE SODIUM 20 MG PO TBEC: 20.0000 mg | DELAYED_RELEASE_TABLET | Freq: Every day | ORAL | Status: DC

## 2018-04-05 MED ORDER — OMEGA-3 FISH OIL 1200 MG PO CAPS: 2400.0000 mg | ORAL_CAPSULE | Freq: Every day | ORAL | Status: DC
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
## 2018-04-05 NOTE — Telephone Encounter (Signed)
Gave avs and calendar ° °

## 2018-05-08 ENCOUNTER — Ambulatory Visit: Payer: Medicare Other | Admitting: Allergy and Immunology

## 2018-05-08 DIAGNOSIS — J45909 Unspecified asthma, uncomplicated: Secondary | ICD-10-CM | POA: Diagnosis not present

## 2018-05-08 DIAGNOSIS — E559 Vitamin D deficiency, unspecified: Secondary | ICD-10-CM | POA: Diagnosis not present

## 2018-05-08 DIAGNOSIS — K219 Gastro-esophageal reflux disease without esophagitis: Secondary | ICD-10-CM | POA: Diagnosis not present

## 2018-05-08 DIAGNOSIS — E039 Hypothyroidism, unspecified: Secondary | ICD-10-CM | POA: Diagnosis not present

## 2018-05-08 DIAGNOSIS — E7800 Pure hypercholesterolemia, unspecified: Secondary | ICD-10-CM | POA: Diagnosis not present

## 2018-05-08 DIAGNOSIS — M129 Arthropathy, unspecified: Secondary | ICD-10-CM | POA: Diagnosis not present

## 2018-05-31 ENCOUNTER — Emergency Department (HOSPITAL_COMMUNITY): Payer: Medicare Other

## 2018-05-31 ENCOUNTER — Other Ambulatory Visit: Payer: Self-pay

## 2018-05-31 ENCOUNTER — Encounter (HOSPITAL_COMMUNITY): Payer: Self-pay

## 2018-05-31 ENCOUNTER — Emergency Department (HOSPITAL_COMMUNITY)
Admission: EM | Admit: 2018-05-31 | Discharge: 2018-05-31 | Disposition: A | Discharge status: Home / Self Care | Payer: Medicare Other | Attending: Emergency Medicine | Admitting: Emergency Medicine

## 2018-05-31 DIAGNOSIS — Z79899 Other long term (current) drug therapy: Secondary | ICD-10-CM | POA: Insufficient documentation

## 2018-05-31 DIAGNOSIS — E039 Hypothyroidism, unspecified: Secondary | ICD-10-CM | POA: Diagnosis not present

## 2018-05-31 DIAGNOSIS — R0789 Other chest pain: Secondary | ICD-10-CM | POA: Insufficient documentation

## 2018-05-31 DIAGNOSIS — Z8709 Personal history of other diseases of the respiratory system: Secondary | ICD-10-CM | POA: Diagnosis not present

## 2018-05-31 DIAGNOSIS — Z853 Personal history of malignant neoplasm of breast: Secondary | ICD-10-CM | POA: Insufficient documentation

## 2018-05-31 DIAGNOSIS — R079 Chest pain, unspecified: Secondary | ICD-10-CM | POA: Diagnosis not present

## 2018-05-31 DIAGNOSIS — R002 Palpitations: Secondary | ICD-10-CM | POA: Diagnosis not present

## 2018-05-31 LAB — COMPREHENSIVE METABOLIC PANEL
ALT: 23 U/L (ref 0–44)
AST: 24 U/L (ref 15–41)
Albumin: 3.7 g/dL (ref 3.5–5.0)
Alkaline Phosphatase: 43 U/L (ref 38–126)
Anion gap: 11 (ref 5–15)
BUN: 17 mg/dL (ref 8–23)
CO2: 23 mmol/L (ref 22–32)
Calcium: 9.1 mg/dL (ref 8.9–10.3)
Chloride: 105 mmol/L (ref 98–111)
Creatinine, Ser: 0.81 mg/dL (ref 0.44–1.00)
GFR calc Af Amer: >60 mL/min (ref >60)
GFR calc non Af Amer: >60 mL/min (ref >60)
Glucose, Bld: 91 mg/dL (ref 70–99)
Potassium: 3.5 mmol/L (ref 3.5–5.1)
Sodium: 139 mmol/L (ref 135–145)
Total Bilirubin: 0.5 mg/dL (ref 0.3–1.2)
Total Protein: 6.5 g/dL (ref 6.5–8.1)

## 2018-05-31 LAB — D-DIMER, QUANTITATIVE: D-Dimer, Quant: <0.27 ug/mL-FEU (ref 0.00–0.50)

## 2018-05-31 LAB — CBC WITH DIFFERENTIAL/PLATELET
Abs Immature Granulocytes: 0.01 10*3/uL (ref 0.00–0.07)
Basophils Absolute: 0 10*3/uL (ref 0.0–0.1)
Basophils Relative: 1 %
Eosinophils Absolute: 0.1 10*3/uL (ref 0.0–0.5)
Eosinophils Relative: 2 %
HCT: 40.9 % (ref 36.0–46.0)
Hemoglobin: 13.2 g/dL (ref 12.0–15.0)
Immature Granulocytes: 0 %
Lymphocytes Relative: 36 %
Lymphs Abs: 1.8 10*3/uL (ref 0.7–4.0)
MCH: 29.6 pg (ref 26.0–34.0)
MCHC: 32.3 g/dL (ref 30.0–36.0)
MCV: 91.7 fL (ref 80.0–100.0)
Monocytes Absolute: 0.4 10*3/uL (ref 0.1–1.0)
Monocytes Relative: 8 %
Neutro Abs: 2.7 10*3/uL (ref 1.7–7.7)
Neutrophils Relative %: 53 %
Platelets: 199 10*3/uL (ref 150–400)
RBC: 4.46 MIL/uL (ref 3.87–5.11)
RDW: 12.9 % (ref 11.5–15.5)
WBC: 5 10*3/uL (ref 4.0–10.5)
nRBC: 0 % (ref 0.0–0.2)

## 2018-05-31 LAB — TROPONIN I
Troponin I: <0.03 ng/mL (ref <0.03)
Troponin I: <0.03 ng/mL (ref <0.03)

## 2018-05-31 MED ORDER — ASPIRIN 81 MG PO CHEW
324.0000 mg | CHEWABLE_TABLET | Freq: Once | ORAL | Status: AC
  Administered 2018-05-31: 324 mg via ORAL
  Filled 2018-05-31: qty 4
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
## 2018-05-31 NOTE — ED Triage Notes (Signed)
Pt endorses intermittent central non radiating chest pain x 3 days, mostly at night when laying down. Had an episode of chest discomfort today while dancing. VSS.

## 2018-06-01 DIAGNOSIS — M7741 Metatarsalgia, right foot: Secondary | ICD-10-CM | POA: Diagnosis not present

## 2018-06-01 DIAGNOSIS — M722 Plantar fascial fibromatosis: Secondary | ICD-10-CM | POA: Diagnosis not present

## 2018-06-01 DIAGNOSIS — M7742 Metatarsalgia, left foot: Secondary | ICD-10-CM | POA: Diagnosis not present

## 2018-06-05 ENCOUNTER — Telehealth: Payer: Self-pay

## 2018-06-05 NOTE — Telephone Encounter (Signed)
Patient went to the ED on April 30th and had Chest X Rays done. The patient wanted you to review these before her appt on Tuesday 06/12/2018.  Thanks

## 2018-06-12 ENCOUNTER — Ambulatory Visit (INDEPENDENT_AMBULATORY_CARE_PROVIDER_SITE_OTHER): Payer: Medicare Other | Admitting: Allergy and Immunology

## 2018-06-12 ENCOUNTER — Encounter: Payer: Self-pay | Admitting: Allergy and Immunology

## 2018-06-12 ENCOUNTER — Other Ambulatory Visit: Payer: Self-pay

## 2018-06-12 VITALS — BP 110/68 | HR 80 | Temp 97.8°F | Resp 18

## 2018-06-12 DIAGNOSIS — J3089 Other allergic rhinitis: Secondary | ICD-10-CM

## 2018-06-12 DIAGNOSIS — Z91030 Bee allergy status: Secondary | ICD-10-CM | POA: Diagnosis not present

## 2018-06-12 DIAGNOSIS — J4520 Mild intermittent asthma, uncomplicated: Secondary | ICD-10-CM | POA: Diagnosis not present

## 2018-06-12 DIAGNOSIS — K219 Gastro-esophageal reflux disease without esophagitis: Secondary | ICD-10-CM

## 2018-06-12 DIAGNOSIS — Z91038 Other insect allergy status: Secondary | ICD-10-CM

## 2018-06-12 NOTE — Patient Instructions (Addendum)
  1. Continue to treat reflux:   A. Protonix 20 mg 0-7 times a week  2. Continue the following if needed:   A. Zyrtec 10 mg one tablet once a day  B. Mucinex  C. nasal saline / gel  D. Ventolin HFA 2 puffs every 4-6 hours  E. EpiPen  3. Return to clinic in 6 months or earlier if problem

## 2018-06-12 NOTE — Progress Notes (Signed)
[place] [facility] [place] [facility] [place]   Follow-up Note  Referring Provider: Leighton Ruff, MD Primary Provider: Leighton Ruff, MD Date of Office Visit: 06/12/2018  Subjective:   Valerie Snyder (DOB: 31-Mar-1950) is a 68 y.o. 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  Her last visit to this clinic was 24 October 2017.  Apparently in January 2020 she developed a viral illness and did develop a significant amount of both upper and lower respiratory tract symptoms but fortunately appeared to abate after about a week or 2 of symptoms.  It sounds as though she did see her primary care doctor for that event but did not really remember what medications were given to her.  Otherwise, her airway really does very well and she has not required a systemic steroid or an antibiotic to treat any type of airway issue and she rarely uses a short acting bronchodilator and she exercises about 6 to 8 hours/week.  Her nose is really doing quite well although she does have a little postnasal drip on occasion.  About 2 weeks ago she did develop a lot of postnasal drip that was little bit ugly but that has been improving every day since that point in time.  Her reflux is under very good control at this point.  She has obtained an adjustable bed and has her head of her bed elevated.  She is very careful about what she eats regarding caffeine or chocolate.  She no longer requires a proton pump inhibitor at this point in time.  She is had very little issues with her throat.  Valerie Snyder underwent a chest pain evaluation in the emergency room this spring.  Apparently she had a negative evaluation but she is scheduled to see a cardiologist for a stress  test.  Allergies as of 06/12/2018      Reactions   Bee Venom Anaphylaxis   Keflex [cephalexin] Anaphylaxis   Puffy eyes and lips   Toradol [ketorolac Tromethamine] Anaphylaxis   Puffy lips and eyes      Medication List      celecoxib 200 MG capsule Commonly known as:  CELEBREX Take 200 mg by mouth daily.   CENTRUM SILVER ADULT 50+ PO Take 1 tablet by mouth daily.   cetirizine 10 MG tablet Commonly known as:  ZYRTEC Take 10 mg by mouth daily as needed for allergies.   Cranberry 450 MG Caps Take 900 mg by mouth daily.   D-3-5 125 MCG (5000 UT) capsule Generic drug:  Cholecalciferol Take 5,000 Units by mouth daily.   EPINEPHrine 0.3 mg/0.3 mL Soaj injection Commonly known as:  EpiPen 2-Pak Inject 0.3 mLs (0.3 mg total) into the muscle once as needed. Reported on 01/20/2015   levothyroxine 100 MCG tablet Commonly known as:  SYNTHROID Take 100 mcg by mouth daily before breakfast.   Omega-3 Fish Oil 1200 MG Caps Take 2 capsules (2,400 mg total) by mouth daily.   OSTEO BI-FLEX ADV TRIPLE ST PO Take 2 tablets by mouth daily.   pantoprazole 20 MG tablet Commonly known as:  Protonix Take 1 tablet (20 mg total) by mouth daily.   SOLUBLE FIBER/PROBIOTICS PO Take 1 capsule by mouth daily. 20 billion   SUMAtriptan 25 MG tablet Commonly known as:  IMITREX Take 25  mg by mouth every 2 (two) hours as needed for migraine.   tamoxifen 10 MG tablet Commonly known as:  NOLVADEX Take 1 tablet (10 mg total) by mouth daily.       Past Medical History:  Diagnosis Date  . Allergy   . Asthma   . Breast cancer of 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 breast ([place]) 11/13/2014  . Complication of anesthesia    10 years ago- pt states she stopped breathing and "had to be bagged"  . Concussion 2018  . GERD (gastroesophageal reflux disease)   . H/O bilateral inguinal hernia repair 1992  . Hypercholesteremia   . Hypothyroidism   . Migraines   . Personal history of radiation  therapy   . Plantar fasciitis, bilateral   . Radiation 01/12/15-02/04/15   right breast 42.17 gray  . Spinal stenosis   . Thyroid disease     Past Surgical History:  Procedure Laterality Date  . BLADDER SUSPENSION    . BLEPHAROPLASTY Bilateral   . BREAST LUMPECTOMY Right 12/10/2014  . BREAST LUMPECTOMY WITH RADIOACTIVE SEED LOCALIZATION Right 12/10/2014   Procedure: RIGHT BREAST LUMPECTOMY WITH RADIOACTIVE SEED LOCALIZATION;  Surgeon: Autumn Messing III, MD;  Location: [facility];  Service: General;  Laterality: Right;  . FOOT SURGERY     plantar fashiectomies from both feet  . HERNIA REPAIR    . HYSTEROSCOPY W/D&C N/A 12/27/2017   Procedure: DILATATION AND CURETTAGE /HYSTEROSCOPY;  Surgeon: Janyth Pupa, DO;  Location: [facility];  Service: Gynecology;  Laterality: N/A;  . NASAL SEPTUM SURGERY    . neuromas removed from right foot    . 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 POLYPECTOMY  12/27/2017   Procedure: POLYPECTOMY;  Surgeon: Janyth Pupa, DO;  Location: [facility];  Service: Gynecology;;  . removal of basal cell carcinoma (nose)    . TONSILECTOMY/ADENOIDECTOMY WITH MYRINGOTOMY    . TONSILLECTOMY      Review of systems negative except as noted in HPI / PMHx or noted below:  Review of Systems  Constitutional: Negative.   HENT: Negative.   Eyes: Negative.   Respiratory: Negative.   Cardiovascular: Negative.   Gastrointestinal: Negative.   Genitourinary: Negative.   Musculoskeletal: Negative.   Skin: Negative.   Neurological: Negative.   Endo/Heme/Allergies: Negative.   Psychiatric/Behavioral: Negative.      Objective:   Vitals:   06/12/18 1541  BP: 110/68  Pulse: 80  Resp: 18  Temp: 97.8 F (36.6 C)  SpO2: 97%          Physical Exam Constitutional:      Appearance: She is not diaphoretic.  HENT:     Head: Normocephalic.     Right Ear: Tympanic membrane, ear canal and external ear normal.      Left Ear: Tympanic membrane, ear canal and external ear normal.     Nose: Nose normal. No mucosal edema or rhinorrhea.     Mouth/Throat:     Pharynx: Uvula midline. No oropharyngeal exudate.  Eyes:     Conjunctiva/sclera: Conjunctivae normal.  Neck:     Thyroid: No thyromegaly.     Trachea: Trachea normal. No tracheal tenderness or tracheal deviation.  Cardiovascular:     Rate and Rhythm: Normal rate and regular rhythm.     Heart sounds: Normal heart sounds, S1 normal and S2 normal. No murmur.  Pulmonary:     Effort: No respiratory distress.     Breath sounds: Normal  breath sounds. No stridor. No wheezing or rales.  Lymphadenopathy:     Head:     Right side of head: No tonsillar adenopathy.     Left side of head: No tonsillar adenopathy.     Cervical: No cervical adenopathy.  Skin:    Findings: No erythema or rash.     Nails: There is no clubbing.   Neurological:     Mental Status: She is alert.     Diagnostics:    Spirometry was performed and demonstrated an FEV1 of 2.01 at 88 % of predicted.  The patient had an Asthma Control Test with the following results: ACT Total Score: 25.    Results of a chest x-ray obtained 31 May 2018 identified the following:  Lungs are hyperexpanded suggesting emphysema. No focal airspace consolidation. No pneumothorax or pleural effusion. The cardiopericardial silhouette is within normal limits for size. The  visualized bony structures of the thorax are intact. Telemetry leads overlie the chest  Results of blood tests obtained 31 May 2018 identified WBC 5.0, absolute eosinophil 100, absolute eosinophil 1800, hemoglobin 13.2, platelet 199  Assessment and Plan:   1. Asthma, mild intermittent, well-controlled   2. Other allergic rhinitis   3. Hymenoptera allergy   4. LPRD (laryngopharyngeal reflux disease)     1. Continue to treat reflux:   A. Protonix 20 mg 0-7 times a week  2. Continue the following if needed:   A. Zyrtec 10  mg one tablet once a day  B. Mucinex  C. nasal saline / gel  D. Ventolin HFA 2 puffs every 4-6 hours  E. EpiPen  3. Return to clinic in 6 months or earlier if problem  Suesan appears to be doing relatively well on her plan which includes a minimal amount of medications.  Her issue with LPR appears to have abated now that she is very careful about triggers of reflux and using her adjustable bed.  Her atopic disease has not really bothered her very much and she has not really required much therapy for this condition at this point.  I will see her back in this clinic in 6 months or earlier if there is a problem.  Allena Katz, MD Allergy / Immunology [place]

## 2018-06-13 ENCOUNTER — Encounter: Payer: Self-pay | Admitting: Allergy and Immunology

## 2018-07-17 DIAGNOSIS — D1801 Hemangioma of skin and subcutaneous tissue: Secondary | ICD-10-CM | POA: Diagnosis not present

## 2018-07-17 DIAGNOSIS — L821 Other seborrheic keratosis: Secondary | ICD-10-CM | POA: Diagnosis not present

## 2018-07-17 DIAGNOSIS — Z85828 Personal history of other malignant neoplasm of skin: Secondary | ICD-10-CM | POA: Diagnosis not present

## 2018-07-17 DIAGNOSIS — D225 Melanocytic nevi of trunk: Secondary | ICD-10-CM | POA: Diagnosis not present

## 2018-07-23 DIAGNOSIS — L723 Sebaceous cyst: Secondary | ICD-10-CM | POA: Diagnosis not present

## 2018-07-23 DIAGNOSIS — N952 Postmenopausal atrophic vaginitis: Secondary | ICD-10-CM | POA: Diagnosis not present

## 2018-07-23 DIAGNOSIS — Z853 Personal history of malignant neoplasm of breast: Secondary | ICD-10-CM | POA: Diagnosis not present

## 2018-07-23 DIAGNOSIS — Z01411 Encounter for gynecological examination (general) (routine) with abnormal findings: Secondary | ICD-10-CM | POA: Diagnosis not present

## 2018-08-12 DIAGNOSIS — M546 Pain in thoracic spine: Secondary | ICD-10-CM | POA: Diagnosis not present

## 2018-08-20 DIAGNOSIS — E7800 Pure hypercholesterolemia, unspecified: Secondary | ICD-10-CM | POA: Diagnosis not present

## 2018-08-20 DIAGNOSIS — E039 Hypothyroidism, unspecified: Secondary | ICD-10-CM | POA: Diagnosis not present

## 2018-08-20 DIAGNOSIS — E559 Vitamin D deficiency, unspecified: Secondary | ICD-10-CM | POA: Diagnosis not present

## 2018-08-20 DIAGNOSIS — K219 Gastro-esophageal reflux disease without esophagitis: Secondary | ICD-10-CM | POA: Diagnosis not present

## 2018-08-20 DIAGNOSIS — J45909 Unspecified asthma, uncomplicated: Secondary | ICD-10-CM | POA: Diagnosis not present

## 2018-08-20 DIAGNOSIS — M129 Arthropathy, unspecified: Secondary | ICD-10-CM | POA: Diagnosis not present

## 2018-09-14 DIAGNOSIS — Z23 Encounter for immunization: Secondary | ICD-10-CM | POA: Diagnosis not present

## 2018-09-19 ENCOUNTER — Telehealth: Payer: Self-pay

## 2018-09-19 NOTE — Telephone Encounter (Signed)
RN spoke with patient regarding the MonaLisa procedure.  Pt was educated that MD still high recommends this for patient.  RN notified that [facility] is the facility that would be able to provider this service.  Pt voiced appreciation.  All questions answered.  No further needs.

## 2018-10-04 DIAGNOSIS — E663 Overweight: Secondary | ICD-10-CM | POA: Diagnosis not present

## 2018-10-04 DIAGNOSIS — M19041 Primary osteoarthritis, right hand: Secondary | ICD-10-CM | POA: Diagnosis not present

## 2018-10-04 DIAGNOSIS — M154 Erosive (osteo)arthritis: Secondary | ICD-10-CM | POA: Diagnosis not present

## 2018-10-04 DIAGNOSIS — M7711 Lateral epicondylitis, right elbow: Secondary | ICD-10-CM | POA: Diagnosis not present

## 2018-10-04 DIAGNOSIS — M19042 Primary osteoarthritis, left hand: Secondary | ICD-10-CM | POA: Diagnosis not present

## 2018-10-04 DIAGNOSIS — Z6825 Body mass index (BMI) 25.0-25.9, adult: Secondary | ICD-10-CM | POA: Diagnosis not present

## 2018-10-04 DIAGNOSIS — M722 Plantar fascial fibromatosis: Secondary | ICD-10-CM | POA: Diagnosis not present

## 2018-10-04 DIAGNOSIS — M2550 Pain in unspecified joint: Secondary | ICD-10-CM | POA: Diagnosis not present

## 2018-10-10 ENCOUNTER — Telehealth: Payer: Self-pay | Admitting: Cardiovascular Disease

## 2018-10-10 ENCOUNTER — Other Ambulatory Visit: Payer: Self-pay

## 2018-10-10 ENCOUNTER — Encounter: Payer: Self-pay | Admitting: Cardiovascular Disease

## 2018-10-10 ENCOUNTER — Ambulatory Visit (INDEPENDENT_AMBULATORY_CARE_PROVIDER_SITE_OTHER): Payer: Medicare Other | Admitting: Cardiovascular Disease

## 2018-10-10 VITALS — BP 114/68 | HR 87 | Ht 65.0 in | Wt 151.0 lb

## 2018-10-10 DIAGNOSIS — E7800 Pure hypercholesterolemia, unspecified: Secondary | ICD-10-CM | POA: Diagnosis not present

## 2018-10-10 DIAGNOSIS — Z5181 Encounter for therapeutic drug level monitoring: Secondary | ICD-10-CM

## 2018-10-10 DIAGNOSIS — R002 Palpitations: Secondary | ICD-10-CM | POA: Diagnosis not present

## 2018-10-10 HISTORY — DX: Pure hypercholesterolemia, unspecified: E78.00

## 2018-10-10 MED ORDER — EZETIMIBE 10 MG PO TABS: 10.0000 mg | ORAL_TABLET | Freq: Every day | ORAL | 3 refills | Status: DC

## 2018-10-10 NOTE — Patient Instructions (Signed)
Medication Instructions:  START ZETIA 10 MG DAILY   If you need a refill on your cardiac medications before your next appointment, please call your pharmacy.   Lab work: FASTING LP/CMET IN 3 MONTHS  If you have labs (blood work) drawn today and your tests are completely normal, you will receive your results only by: Marland Kitchen MyChart Message (if you have MyChart) OR . A paper copy in the mail If you have any lab test that is abnormal or we need to change your treatment, we will call you to review the results.  Testing/Procedures: CALCIUM SCORE, WILL COST YOU $150 OUT OF POCKET   Follow-Up: At [facility], you and your health needs are our priority.  As part of our continuing mission to provide you with exceptional heart care, we have created designated Provider Care Teams.  These Care Teams include your primary Cardiologist (physician) and Advanced Practice Providers (APPs -  Physician Assistants and Nurse Practitioners) who all work together to provide you with the care you need, when you need it. You will need a follow up appointment in 3 months, AFTER YOUR LAB WORK IS DONE.  You may see DR [facility]  or one of the following Advanced Practice Providers on your designated Care Team:   Kerin Ransom, PA-C Roby Lofts, [place] . Sande Rives, PA-C

## 2018-10-10 NOTE — Telephone Encounter (Signed)
New Message  Patient ans patient's husband has concerns about the medication ezetimibe (ZETIA) 10 MG tablet and the CT Cardiac that patient is scheduled for. Neither are covered under Medicare Part B and patient needs assistance with finding a solution to getting a CT scheduled that would be covered and medication that is more affordable. Please give patient a call back to assist.

## 2018-10-10 NOTE — Telephone Encounter (Signed)
As Valerie Snyder and I discussed in clinic today, ezetimibe is $10 at [employer] through goodrx.  She indicated that this was affordable for her.  She has not tolerated statins and other cholesterol medications are new and far more expensive.  We also discussed the $150 calcium score, the fact that it isn't covered by insurance, and she indicated that she wanted to proceed.  Given her lack of symptoms she doesn't qualify for stress testing, coronary CT-A, etc.  The two of them can discuss and she can let us know how she would like to proceed.

## 2018-10-10 NOTE — Telephone Encounter (Signed)
Routed to primary nurse and MD

## 2018-10-10 NOTE — Telephone Encounter (Addendum)
Spoke with patient and her husband. He would like to know if there is anything else that would be covered under her insurance that Dr Oval Linsey could order that would give information she is looking for.  Explained to husband patient has no s/s so it is unlikely but would forward to Dr Oval Linsey again for review

## 2018-10-10 NOTE — Progress Notes (Signed)
Cardiology Office Note   Date:  10/10/2018   ID:  Valerie Snyder, DOB 11/21/1950, MRN [id number]  PCP:  Leighton Ruff, MD  Cardiologist:   Skeet Latch, MD   No chief complaint on file.   History of Present Illness: Valerie Snyder is a 68 y.o. female with hyperlipidemia who presents to establish care.  In April 2020 she developed tachycardia while dancing.   She dances daily for at least 20 minutes.  One day she started feeling like heart was racing while dancing.  The episode lasted for around an hour.  She called her PCP who recommended that she be seen in the ED.  She had no chest pressure, shortness of breath, lightheadedness or dizziness.  By the time she got to the ED her symptoms had subsided.  Telemetry was unremarkable.  Since then she denies any recurrent episodes.  She walks daily and bikes several days per week.  She has no exertional symptoms.  She denies lower extremity edema, orthopnea or PND.  She has a longstanding history of hyperlipidemia despite having an excellent diet and exercising regularly.  She has a strong family history of CAD and atrial fibrillation.    Past Medical History:  Diagnosis Date  . Allergy   . Asthma   . Breast cancer of 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 breast ([place]) 11/13/2014  . Complication of anesthesia    10 years ago- pt states she stopped breathing and "had to be bagged"  . Concussion 2018  . GERD (gastroesophageal reflux disease)   . H/O bilateral inguinal hernia repair 1992  . Hypercholesteremia   . Hypothyroidism   . Migraines   . Personal history of radiation therapy   . Plantar fasciitis, bilateral   . Pure hypercholesterolemia 10/10/2018  . Radiation 01/12/15-02/04/15   right breast 42.17 gray  . Spinal stenosis   . Thyroid disease     Past Surgical History:  Procedure Laterality Date  . BLADDER SUSPENSION    . BLEPHAROPLASTY Bilateral   . BREAST LUMPECTOMY Right 12/10/2014  . BREAST LUMPECTOMY WITH  RADIOACTIVE SEED LOCALIZATION Right 12/10/2014   Procedure: RIGHT BREAST LUMPECTOMY WITH RADIOACTIVE SEED LOCALIZATION;  Surgeon: Autumn Messing III, MD;  Location: [facility];  Service: General;  Laterality: Right;  . FOOT SURGERY     plantar fashiectomies from both feet  . HERNIA REPAIR    . HYSTEROSCOPY W/D&C N/A 12/27/2017   Procedure: DILATATION AND CURETTAGE /HYSTEROSCOPY;  Surgeon: Janyth Pupa, DO;  Location: [facility];  Service: Gynecology;  Laterality: N/A;  . NASAL SEPTUM SURGERY    . neuromas removed from right foot    . 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 POLYPECTOMY  12/27/2017   Procedure: POLYPECTOMY;  Surgeon: Janyth Pupa, DO;  Location: [facility];  Service: Gynecology;;  . removal of basal cell carcinoma (nose)    . TONSILECTOMY/ADENOIDECTOMY WITH MYRINGOTOMY    . TONSILLECTOMY       Current Outpatient Medications  Medication Sig Dispense Refill  . celecoxib (CELEBREX) 200 MG capsule Take 200 mg by mouth daily.    . cetirizine (ZYRTEC) 10 MG tablet Take 10 mg by mouth daily as needed for allergies.     . Cranberry 450 MG CAPS Take 900 mg by mouth daily.     Marland Kitchen EPINEPHrine (EPIPEN 2-PAK) 0.3 mg/0.3 mL IJ SOAJ injection Inject 0.3 mLs (0.3 mg total) into the muscle once as needed. Reported on 01/20/2015 1  Device 1  . levothyroxine (SYNTHROID, LEVOTHROID) 100 MCG tablet Take 100 mcg by mouth daily before breakfast.    . Misc Natural Products (OSTEO BI-FLEX ADV TRIPLE ST PO) Take 2 tablets by mouth daily.    . Multiple Vitamins-Minerals (CENTRUM SILVER ADULT 50+ PO) Take 1 tablet by mouth daily.     . Omega-3 Fatty Acids (OMEGA-3 FISH OIL) 1200 MG CAPS Take 2 capsules (2,400 mg total) by mouth daily. (Patient taking differently: Take 4,000 mg by mouth daily. )    . pantoprazole (PROTONIX) 20 MG tablet Take 1 tablet (20 mg total) by mouth daily.    . Probiotic Product (SOLUBLE FIBER/PROBIOTICS PO) Take 1 capsule by  mouth daily. 20 billion    . SUMAtriptan (IMITREX) 25 MG tablet Take 25 mg by mouth every 2 (two) hours as needed for migraine.     . tamoxifen (NOLVADEX) 10 MG tablet Take 1 tablet (10 mg total) by mouth daily. 90 tablet 2  . ezetimibe (ZETIA) 10 MG tablet Take 1 tablet (10 mg total) by mouth daily. 90 tablet 3   No current facility-administered medications for this visit.     Allergies:   Bee venom, Keflex [cephalexin], and Toradol [ketorolac tromethamine]    Social History:  The patient  reports that she has never smoked. She has never used smokeless tobacco. She reports that she does not drink alcohol or use drugs.   Family History:  The patient's family history includes Aneurysm in her maternal grandmother; Breast cancer in her cousin; Colon polyps in her daughter and mother; Congestive Heart Failure (age of onset: 14) in her mother; Dementia in her father; Depression in her father, sister, and sister; Emphysema in her paternal grandfather; Heart Problems in her paternal grandfather; Heart attack in her maternal grandfather; Kidney failure in her father; Lung disease in her mother; Multiple sclerosis in her sister; Other in her sister; Pancreatic cancer in her maternal aunt; Stroke in her father; Testicular cancer in her cousin.    ROS:  Please see the history of present illness.   Otherwise, review of systems are positive for none.   All other systems are reviewed and negative.    PHYSICAL EXAM: VS:  BP 114/68   Pulse 87   Ht 5\' 5"  (1.651 m)   Wt 151 lb (68.5 kg)   SpO2 98%   BMI 25.13 kg/m  , BMI Body mass index is 25.13 kg/m. GENERAL:  Well appearing HEENT:  Pupils equal round and reactive, fundi not visualized, oral mucosa unremarkable NECK:  No jugular venous distention, waveform within normal limits, carotid upstroke brisk and symmetric, no bruits LUNGS:  Clear to auscultation bilaterally HEART:  RRR.  PMI not displaced or sustained,S1 and S2 within normal limits, no S3, no  S4, no clicks, no rubs, no murmurs ABD:  Flat, positive bowel sounds normal in frequency in pitch, no bruits, no rebound, no guarding, no midline pulsatile mass, no hepatomegaly, no splenomegaly EXT:  2 plus pulses throughout, no edema, no cyanosis no clubbing SKIN:  No rashes no nodules NEURO:  Cranial nerves II through XII grossly intact, motor grossly intact throughout PSYCH:  Cognitively intact, oriented to person place and time   EKG:  EKG is ordered today. The ekg ordered today demonstrates sinus rhythm.  Rate 87 bpm   Recent Labs: 05/31/2018: ALT 23; BUN 17; Creatinine, Ser 0.81; Hemoglobin 13.2; Platelets 199; Potassium 3.5; Sodium 139    Lipid Panel No results found for: CHOL, TRIG, HDL, CHOLHDL,  VLDL, LDLCALC, LDLDIRECT    Wt Readings from Last 3 Encounters:  10/10/18 151 lb (68.5 kg)  04/05/18 154 lb 14.4 oz (70.3 kg)  12/27/17 148 lb 5.9 oz (67.3 kg)      ASSESSMENT AND PLAN:  # Palpitations: No recurrent arrhythmias.  I'm concerned about possible atrial fibrillation due to her family history.  The episode was remote and sporadic.  Therefore ambulatory monitoring is unlikely to be helpful.  We discussed getting an Financial controller app or calling our office if she has recurrent symptoms.   # Hyperlipidemia: She hasn't tolerated statins and has tried several.  Given that she has no established CAD risk she isn't a candidate for PCSK9 inhibitors  She will try Zetia.  She is also interested in getting a coronary calcium score to better understand her risk.  Check lipids/CMP in 3 months.   Current medicines are reviewed at length with the patient today.  The patient does not have concerns regarding medicines.  The following changes have been made:  Start Zetia 10mg  daily  Labs/ tests ordered today include:   Orders Placed This Encounter  Procedures  . CT CARDIAC SCORING  . Lipid panel  . Comprehensive metabolic panel  . EKG 12-Lead     Disposition:   FU with Valerie Gorder C.  Oval Linsey, MD, [facility] in 3 months.      Signed, Valerie Teschner C. Oval Linsey, MD, [facility]  10/10/2018 10:01 PM    [place]

## 2018-10-10 NOTE — Telephone Encounter (Signed)
If there were, I would have ordered it.  She has no symptoms so insurance isn't going to cover testing.

## 2018-10-11 ENCOUNTER — Encounter: Payer: Self-pay | Admitting: *Deleted

## 2018-10-11 ENCOUNTER — Other Ambulatory Visit: Payer: Self-pay | Admitting: Hematology and Oncology

## 2018-10-11 DIAGNOSIS — Z853 Personal history of malignant neoplasm of breast: Secondary | ICD-10-CM | POA: Diagnosis not present

## 2018-10-11 DIAGNOSIS — D0511 Intraductal carcinoma in situ of right breast: Secondary | ICD-10-CM | POA: Diagnosis not present

## 2018-10-11 DIAGNOSIS — E039 Hypothyroidism, unspecified: Secondary | ICD-10-CM | POA: Diagnosis not present

## 2018-10-11 DIAGNOSIS — J45909 Unspecified asthma, uncomplicated: Secondary | ICD-10-CM | POA: Diagnosis not present

## 2018-10-11 DIAGNOSIS — E7800 Pure hypercholesterolemia, unspecified: Secondary | ICD-10-CM | POA: Diagnosis not present

## 2018-10-11 NOTE — Telephone Encounter (Signed)
Mychart message sent to patient.

## 2018-10-16 ENCOUNTER — Encounter

## 2018-10-16 NOTE — Telephone Encounter (Signed)
Message sent to patient, she will proceed as suggested

## 2018-10-18 DIAGNOSIS — D0511 Intraductal carcinoma in situ of right breast: Secondary | ICD-10-CM | POA: Diagnosis not present

## 2018-11-01 ENCOUNTER — Other Ambulatory Visit: Payer: Self-pay

## 2018-11-01 ENCOUNTER — Ambulatory Visit: Payer: Medicare Other | Attending: General Surgery | Admitting: Physical Therapy

## 2018-11-01 DIAGNOSIS — L599 Disorder of the skin and subcutaneous tissue related to radiation, unspecified: Secondary | ICD-10-CM | POA: Insufficient documentation

## 2018-11-01 DIAGNOSIS — R252 Cramp and spasm: Secondary | ICD-10-CM | POA: Diagnosis not present

## 2018-11-01 DIAGNOSIS — R293 Abnormal posture: Secondary | ICD-10-CM | POA: Diagnosis not present

## 2018-11-01 DIAGNOSIS — M6281 Muscle weakness (generalized): Secondary | ICD-10-CM | POA: Insufficient documentation

## 2018-11-01 DIAGNOSIS — N644 Mastodynia: Secondary | ICD-10-CM | POA: Insufficient documentation

## 2018-11-01 NOTE — Patient Instructions (Signed)
[id number] org

## 2018-11-01 NOTE — Therapy (Deleted)
[place], [place], [address] Phone: [phone]   Fax:  [phone]  Physical Therapy Treatment  Patient Details  Name: Valerie Snyder MRN: [id number] Date of Birth: 1950-10-18 Referring Provider (PT): Dr. Marlou Starks   Encounter Date: 11/01/2018  PT End of Session - 11/01/18 1201    Visit Number  1    Number of Visits  9    Date for PT Re-Evaluation  12/02/18    PT Start Time  1100    PT Stop Time  1145    PT Time Calculation (min)  45 min    Activity Tolerance  Patient tolerated treatment well    Behavior During Therapy  [facility] for tasks assessed/performed       Past Medical History:  Diagnosis Date  . Allergy   . Asthma   . Breast cancer of 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 breast ([place]) 11/13/2014  . Complication of anesthesia    10 years ago- pt states she stopped breathing and "had to be bagged"  . Concussion 2018  . GERD (gastroesophageal reflux disease)   . H/O bilateral inguinal hernia repair 1992  . Hypercholesteremia   . Hypothyroidism   . Migraines   . Personal history of radiation therapy   . Plantar fasciitis, bilateral   . Pure hypercholesterolemia 10/10/2018  . Radiation 01/12/15-02/04/15   right breast 42.17 gray  . Spinal stenosis   . Thyroid disease     Past Surgical History:  Procedure Laterality Date  . BLADDER SUSPENSION    . BLEPHAROPLASTY Bilateral   . BREAST LUMPECTOMY Right 12/10/2014  . BREAST LUMPECTOMY WITH RADIOACTIVE SEED LOCALIZATION Right 12/10/2014   Procedure: RIGHT BREAST LUMPECTOMY WITH RADIOACTIVE SEED LOCALIZATION;  Surgeon: Autumn Messing III, MD;  Location: [place];  Service: General;  Laterality: Right;  . FOOT SURGERY     plantar fashiectomies from both feet  . HERNIA REPAIR    . HYSTEROSCOPY W/D&C N/A 12/27/2017   Procedure: DILATATION AND CURETTAGE /HYSTEROSCOPY;  Surgeon: Janyth Pupa, DO;  Location: [place];   Service: Gynecology;  Laterality: N/A;  . NASAL SEPTUM SURGERY    . neuromas removed from right foot    . 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 POLYPECTOMY  12/27/2017   Procedure: POLYPECTOMY;  Surgeon: Janyth Pupa, DO;  Location: [place];  Service: Gynecology;;  . removal of basal cell carcinoma (nose)    . TONSILECTOMY/ADENOIDECTOMY WITH MYRINGOTOMY    . TONSILLECTOMY      There were no vitals filed for this visit.  Subjective Assessment - 11/01/18 1110    Subjective  Pt has tightness with pulling down into breast that had gotten worse over that past year.  Has progressed into painful cramping and twisting.    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    Patient Stated Goals  to get rid of the pain    Currently in Pain?  Yes    Pain Score  10-Worst pain ever   with twisting motion   Pain Location  --   upper lateral trunk   Pain Orientation  Right    Pain Descriptors / Indicators  Cramping    Pain Type  Chronic pain;Acute  pain    Pain Radiating Towards  none. but pt is incapacitatied when it happens    Pain Onset  More than a month ago    Aggravating Factors   twisting and reaching    Pain Relieving Factors  changes position    Effect of Pain on Daily Activities  aftects cooking cleaning laundry         [facility] PT Assessment - 11/01/18 0001      Assessment   Medical Diagnosis  breast cancer     Referring Provider (PT)  Dr. Marlou Starks    Onset Date/Surgical Date  12/02/14    Hand Dominance  Right      Precautions   Precautions  None      Restrictions   Weight Bearing Restrictions  No      Balance Screen   Has the patient fallen in the past 6 months  No    Has the patient had a decrease in activity level because of a fear of falling?   No      Home  [place]    Living Arrangements  Spouse/significant other      Prior Function   Level of [place]  Retired    Leisure  walks about an hour a day, does stairs, and LE stretches and forearm planks  loves to dance 20 minutes a day       Cognition   Overall Cognitive Status  Within Functional Limits for tasks assessed      Observation/Other Assessments   Observations  pt appears very active     Other Surveys   Quick Dash    Quick DASH   52.27      Sensation   Light Touch  Not tested      Coordination   Gross Motor Movements are Fluid and Coordinated  Yes      Posture/Postural Control   Posture/Postural Control  Postural limitations    Postural Limitations  Rounded Shoulders;Forward head;Increased lumbar lordosis      ROM / Strength   AROM / PROM / Strength  AROM;Strength      AROM   Overall AROM Comments  decreaseed standing trunk rotation to the  right limited by pain, decreased lower trunk rotation to the left limited by pain, decreased ability to get arm fully overhead in supine limited by pulling and cramping in lateral trunk     Right Shoulder Flexion  160 Degrees    Right Shoulder ABduction  145 Degrees    Right Shoulder External Rotation  85 Degrees    Left Shoulder Flexion  165 Degrees    Left Shoulder ABduction  160 Degrees    Left Shoulder External Rotation  85 Degrees      Strength   Overall Strength  Within functional limits for tasks performed      Palpation   Palpation comment  firmness in right lateral chest with tenderness to palpation in lateral chest, breast and axilla         LYMPHEDEMA/ONCOLOGY QUESTIONNAIRE - 11/01/18 1155      Type   Cancer Type  right breast       Surgeries   Lumpectomy Date  12/02/14   approximate    Number Lymph Nodes Removed  0      Treatment   Past Radiation Treatment  Yes      What other symptoms do you have   Are you  Having Heaviness or  Tightness  Yes    Are you having Pain  Yes    Are you having pitting edema  No    Is it Hard or Difficult finding clothes that fit  No    Do you have infections  No    Is there Decreased scar mobility  No    Stemmer Sign  No      Right Upper Extremity Lymphedema   15 cm Proximal to Olecranon Process  31.5 cm    10 cm Proximal to Olecranon Process  29.5 cm    Olecranon Process  26 cm    15 cm Proximal to Ulnar Styloid Process  24.4 cm    10 cm Proximal to Ulnar Styloid Process  21 cm    Just Proximal to Ulnar Styloid Process  15.5 cm    Across Hand at [employer]  19 cm    At [place] of 2nd Digit  5.5 cm      Left Upper Extremity Lymphedema   15 cm Proximal to Olecranon Process  31.5 cm    10 cm Proximal to Olecranon Process  29.5 cm    Olecranon Process  26 cm    15 cm Proximal to Ulnar Styloid Process  24 cm    10 cm Proximal to Ulnar Styloid Process  20.5 cm    Just Proximal to Ulnar Styloid Process  15.5 cm    Across Hand at [employer]  19 cm    At [place] of 2nd Digit  5.5 cm        Quick Dash - 11/01/18 0001    Open a tight or new jar  Moderate difficulty    Do heavy household chores (wash walls, wash floors)  Moderate difficulty    Carry a shopping bag or briefcase  Moderate difficulty    Wash your back  Moderate difficulty    Use a knife to cut food  Moderate difficulty    Recreational activities in which you take some force or impact through your arm, shoulder, or hand (golf, hammering, tennis)  Severe difficulty    During the past week, to what extent has your arm, shoulder or hand problem interfered with your normal social activities with family, friends, neighbors, or groups?  Modererately    During the past week, to what extent has your arm, shoulder or hand problem limited your work or other regular daily activities  Modererately    Arm, shoulder, or hand pain.  Moderate    Tingling (pins and needles) in your arm, shoulder, or hand  Moderate    Difficulty  Sleeping  Moderate difficulty    DASH Score  52.27 %             [facility] Adult PT Treatment/Exercise - 11/01/18 0001      Self-Care   Self-Care  Other Self-Care Comments    Other Self-Care Comments   gave pt link for [email or web address] for chair yoga and to wear one of her sports bras that had good compression to her lateral chest and see if that makes a difference in her pain                   PT Long Term Goals - 11/01/18 1252      PT LONG TERM GOAL #1   Title  Pt will report that freqency of pain incidents have decreased by 50% so that she can perform her houshold activities easier  Time  4    Period  Weeks    Status  New      PT LONG TERM GOAL #2   Title  Pt will report the level of her pain, when she does have it , has decreased to 5/10    Baseline  10/10    Time  4    Period  Weeks    Status  New      PT LONG TERM GOAL #3   Title  Pt will be independent in a home exercise program.    Time  4    Period  Weeks    Status  New            Plan - 11/01/18 1204    Clinical Impression Statement  Pt presents to PT 4 years after lumpectomy and radiation with increasing intesnity and frequency of pain and pulling in her right lateral chest and axilla down into breast with bending, reaching and twisting that is affecting her ability to household chores .  She also has osteoarthritis in both hands  and tendinitis in her right elbow. She has no signs of lymphedema.  Feel she would benefit from soft tissue work, ( possibly kinesiotape and compression ) stretching and exercise to help decrease her pain    Personal Factors and Comorbidities  Comorbidity 3+    Comorbidities  radiation, arthitis, tendinitis    PT Next Visit Plan  Meeds decompression exercises and supine dowel rod, gentle lower trunk rotatio in short range, deep breathing with tactile cues to ribs, soft tissue mobilzation as needed. Progress HEP and exercise as needed    Consulted and Agree with Plan of  Care  Patient       Patient will benefit from skilled therapeutic intervention in order to improve the following deficits and impairments:     Visit Diagnosis: Disorder of the skin and subcutaneous tissue related to radiation, unspecified  Pain in breast  Abnormal posture     Problem List Patient Active Problem List   Diagnosis Date Noted  . Pure hypercholesterolemia 10/10/2018  . Genetic testing 12/10/2014  . Breast cancer of 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 breast ([place]) 11/13/2014  . Allergic rhinoconjunctivitis 10/22/2014  . Asthma 10/22/2014   Donato Heinz. Owens Shark PT  Norwood Levo 11/01/2018, 12:56 PM  [address] [place], [place], [address] Phone: [phone]   Fax:  [phone]  Name: Valerie Snyder MRN: [id number] Date of Birth: Sep 08, 1950

## 2018-11-01 NOTE — Therapy (Addendum)
[place], [place], [address] Phone: [phone]   Fax:  [phone]  Physical Therapy Evaluation  Patient Details  Name: Valerie Snyder MRN: [id number] Date of Birth: 06-Mar-1950 Referring Provider (PT): Dr. Marlou Starks   Encounter Date: 11/01/2018  PT End of Session - 11/01/18 1201    Visit Number  1    Number of Visits  9    Date for PT Re-Evaluation  12/02/18    PT Start Time  1100    PT Stop Time  1145    PT Time Calculation (min)  45 min    Activity Tolerance  Patient tolerated treatment well    Behavior During Therapy  [facility] for tasks assessed/performed       Past Medical History:  Diagnosis Date  . Allergy   . Asthma   . Breast cancer of 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 breast ([place]) 11/13/2014  . Complication of anesthesia    10 years ago- pt states she stopped breathing and "had to be bagged"  . Concussion 2018  . GERD (gastroesophageal reflux disease)   . H/O bilateral inguinal hernia repair 1992  . Hypercholesteremia   . Hypothyroidism   . Migraines   . Personal history of radiation therapy   . Plantar fasciitis, bilateral   . Pure hypercholesterolemia 10/10/2018  . Radiation 01/12/15-02/04/15   right breast 42.17 gray  . Spinal stenosis   . Thyroid disease     Past Surgical History:  Procedure Laterality Date  . BLADDER SUSPENSION    . BLEPHAROPLASTY Bilateral   . BREAST LUMPECTOMY Right 12/10/2014  . BREAST LUMPECTOMY WITH RADIOACTIVE SEED LOCALIZATION Right 12/10/2014   Procedure: RIGHT BREAST LUMPECTOMY WITH RADIOACTIVE SEED LOCALIZATION;  Surgeon: Autumn Messing III, MD;  Location: [place];  Service: General;  Laterality: Right;  . FOOT SURGERY     plantar fashiectomies from both feet  . HERNIA REPAIR    . HYSTEROSCOPY W/D&C N/A 12/27/2017   Procedure: DILATATION AND CURETTAGE /HYSTEROSCOPY;  Surgeon: Janyth Pupa, DO;  Location: [place];   Service: Gynecology;  Laterality: N/A;  . NASAL SEPTUM SURGERY    . neuromas removed from right foot    . 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 POLYPECTOMY  12/27/2017   Procedure: POLYPECTOMY;  Surgeon: Janyth Pupa, DO;  Location: [place];  Service: Gynecology;;  . removal of basal cell carcinoma (nose)    . TONSILECTOMY/ADENOIDECTOMY WITH MYRINGOTOMY    . TONSILLECTOMY      There were no vitals filed for this visit.   Subjective Assessment - 11/01/18 1110    Subjective  Pt has tightness with pulling down into breast that had gotten worse over that past year.  Has progressed into painful cramping and twisting.    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    Patient Stated Goals  to get rid of the pain    Currently in Pain?  Yes    Pain Score  10-Worst pain ever   with twisting motion   Pain Location  --   upper lateral trunk   Pain Orientation  Right    Pain Descriptors / Indicators  Cramping    Pain Type  Chronic  pain;Acute pain    Pain Radiating Towards  none. but pt is incapacitatied when it happens    Pain Onset  More than a month ago    Aggravating Factors   twisting and reaching    Pain Relieving Factors  changes position    Effect of Pain on Daily Activities  aftects cooking cleaning laundry         [facility] PT Assessment - 11/01/18 0001      Assessment   Medical Diagnosis  breast cancer     Referring Provider (PT)  Dr. Marlou Starks    Onset Date/Surgical Date  12/02/14    Hand Dominance  Right      Precautions   Precautions  None      Restrictions   Weight Bearing Restrictions  No      Balance Screen   Has the patient fallen in the past 6 months  No    Has the patient had a decrease in activity level because of a fear of falling?   No       Home [place] residence    Living Arrangements  Spouse/significant other      Prior Function   Level of [place]  Retired    Leisure  walks about an hour a day, does stairs, and LE stretches and forearm planks  loves to dance 20 minutes a day       Cognition   Overall Cognitive Status  Within Functional Limits for tasks assessed      Observation/Other Assessments   Observations  pt appears very active     Other Surveys   Quick Dash    Quick DASH   52.27      Sensation   Light Touch  Not tested      Coordination   Gross Motor Movements are Fluid and Coordinated  Yes      Posture/Postural Control   Posture/Postural Control  Postural limitations    Postural Limitations  Rounded Shoulders;Forward head;Increased lumbar lordosis      ROM / Strength   AROM / PROM / Strength  AROM;Strength      AROM   Overall AROM Comments  decreaseed standing trunk rotation to the  right limited by pain, decreased lower trunk rotation to the left limited by pain, decreased ability to get arm fully overhead in supine limited by pulling and cramping in lateral trunk     Right Shoulder Flexion  160 Degrees    Right Shoulder ABduction  145 Degrees    Right Shoulder External Rotation  85 Degrees    Left Shoulder Flexion  165 Degrees    Left Shoulder ABduction  160 Degrees    Left Shoulder External Rotation  85 Degrees      Strength   Overall Strength  Within functional limits for tasks performed      Palpation   Palpation comment  firmness in right lateral chest with tenderness to palpation in lateral chest, breast and axilla         LYMPHEDEMA/ONCOLOGY QUESTIONNAIRE - 11/01/18 1155      Type   Cancer Type  right breast       Surgeries   Lumpectomy Date  12/02/14   approximate    Number Lymph Nodes Removed  0      Treatment   Past Radiation Treatment  Yes      What other symptoms do you have   Are  you Having Heaviness or  Tightness  Yes    Are you having Pain  Yes    Are you having pitting edema  No    Is it Hard or Difficult finding clothes that fit  No    Do you have infections  No    Is there Decreased scar mobility  No    Stemmer Sign  No      Right Upper Extremity Lymphedema   15 cm Proximal to Olecranon Process  31.5 cm    10 cm Proximal to Olecranon Process  29.5 cm    Olecranon Process  26 cm    15 cm Proximal to Ulnar Styloid Process  24.4 cm    10 cm Proximal to Ulnar Styloid Process  21 cm    Just Proximal to Ulnar Styloid Process  15.5 cm    Across Hand at [employer]  19 cm    At [place] of 2nd Digit  5.5 cm      Left Upper Extremity Lymphedema   15 cm Proximal to Olecranon Process  31.5 cm    10 cm Proximal to Olecranon Process  29.5 cm    Olecranon Process  26 cm    15 cm Proximal to Ulnar Styloid Process  24 cm    10 cm Proximal to Ulnar Styloid Process  20.5 cm    Just Proximal to Ulnar Styloid Process  15.5 cm    Across Hand at [employer]  19 cm    At [place] of 2nd Digit  5.5 cm          Quick Dash - 11/01/18 0001    Open a tight or new jar  Moderate difficulty    Do heavy household chores (wash walls, wash floors)  Moderate difficulty    Carry a shopping bag or briefcase  Moderate difficulty    Wash your back  Moderate difficulty    Use a knife to cut food  Moderate difficulty    Recreational activities in which you take some force or impact through your arm, shoulder, or hand (golf, hammering, tennis)  Severe difficulty    During the past week, to what extent has your arm, shoulder or hand problem interfered with your normal social activities with family, friends, neighbors, or groups?  Modererately    During the past week, to what extent has your arm, shoulder or hand problem limited your work or other regular daily activities  Modererately    Arm, shoulder, or hand pain.  Moderate    Tingling (pins and needles) in your arm, shoulder, or hand  Moderate    Difficulty  Sleeping  Moderate difficulty    DASH Score  52.27 %        Objective measurements completed on examination: See above findings.      [place] Adult PT Treatment/Exercise - 11/01/18 0001      Self-Care   Self-Care  Other Self-Care Comments    Other Self-Care Comments   gave pt link for [email or web address] for chair yoga and to wear one of her sports bras that had good compression to her lateral chest and see if that makes a difference in her pain                   PT Long Term Goals - 11/01/18 1252      PT LONG TERM GOAL #1   Title  Pt will report that freqency of pain incidents have decreased by  50% so that she can perform her houshold activities easier    Time  4    Period  Weeks    Status  New      PT LONG TERM GOAL #2   Title  Pt will report the level of her pain, when she does have it , has decreased to 5/10    Baseline  10/10    Time  4    Period  Weeks    Status  New      PT LONG TERM GOAL #3   Title  Pt will be independent in a home exercise program.    Time  4    Period  Weeks    Status  New      PT LONG TERM GOAL #4   Title  Pt will decrease Quick DASH score to < 20 indicating and improvment in UE function    Baseline  52.27    Time  4    Period  Weeks    Status  New             Plan - 11/01/18 1204    Clinical Impression Statement  Pt presents to PT 4 years after lumpectomy and radiation with increasing intesnity and frequency of pain and pulling in her right lateral chest and axilla down into breast with bending, reaching and twisting that is affecting her ability to household chores .  She also has osteoarthritis in both hands  and tendinitis in her right elbow. She has no signs of lymphedema.  Feel she would benefit from soft tissue work, ( possibly kinesiotape and compression ) stretching and exercise to help decrease her pain    Personal Factors and Comorbidities  Comorbidity 3+    Comorbidities  radiation, arthitis, tendinitis    PT Next  Visit Plan  Meeds decompression exercises and supine dowel rod, gentle lower trunk rotatio in short range, deep breathing with tactile cues to ribs, soft tissue mobilzation as needed. Progress HEP and exercise as needed    Consulted and Agree with Plan of Care  Patient       Patient will benefit from skilled therapeutic intervention in order to improve the following deficits and impairments:     Visit Diagnosis: Disorder of the skin and subcutaneous tissue related to radiation, unspecified - Plan: PT plan of care cert/re-cert  Pain in breast - Plan: PT plan of care cert/re-cert  Abnormal posture - Plan: PT plan of care cert/re-cert     Problem List Patient Active Problem List   Diagnosis Date Noted  . Pure hypercholesterolemia 10/10/2018  . Genetic testing 12/10/2014  . Breast cancer of 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 breast ([place]) 11/13/2014  . Allergic rhinoconjunctivitis 10/22/2014  . Asthma 10/22/2014   Donato Heinz. Owens Shark PT  Norwood Levo 11/01/2018, 1:04 PM  [place], [place], [address] Phone: [phone]   Fax:  [phone]  Name: Valerie Snyder MRN: [id number] Date of Birth: December 08, 1950

## 2018-11-06 ENCOUNTER — Ambulatory Visit: Payer: Medicare Other | Admitting: Physical Therapy

## 2018-11-06 ENCOUNTER — Encounter: Payer: Self-pay | Admitting: Physical Therapy

## 2018-11-06 ENCOUNTER — Other Ambulatory Visit: Payer: Self-pay

## 2018-11-06 DIAGNOSIS — N644 Mastodynia: Secondary | ICD-10-CM

## 2018-11-06 DIAGNOSIS — R293 Abnormal posture: Secondary | ICD-10-CM

## 2018-11-06 DIAGNOSIS — M6281 Muscle weakness (generalized): Secondary | ICD-10-CM | POA: Diagnosis not present

## 2018-11-06 DIAGNOSIS — L599 Disorder of the skin and subcutaneous tissue related to radiation, unspecified: Secondary | ICD-10-CM

## 2018-11-06 DIAGNOSIS — R252 Cramp and spasm: Secondary | ICD-10-CM | POA: Diagnosis not present

## 2018-11-06 NOTE — Therapy (Signed)
[place], [place], [address] Phone: [phone]   Fax:  [phone]  Physical Therapy Treatment  Patient Details  Name: Valerie Snyder MRN: [id number] Date of Birth: 04-29-1950 Referring Provider (PT): Dr. Marlou Starks   Encounter Date: 11/06/2018  PT End of Session - 11/06/18 0953    Visit Number  2    Number of Visits  9    Date for PT Re-Evaluation  12/02/18    PT Start Time  0902    PT Stop Time  0950    PT Time Calculation (min)  48 min    Activity Tolerance  Patient tolerated treatment well    Behavior During Therapy  [facility] for tasks assessed/performed       Past Medical History:  Diagnosis Date  . Allergy   . Asthma   . Breast cancer of 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 breast ([place]) 11/13/2014  . Complication of anesthesia    10 years ago- pt states she stopped breathing and "had to be bagged"  . Concussion 2018  . GERD (gastroesophageal reflux disease)   . H/O bilateral inguinal hernia repair 1992  . Hypercholesteremia   . Hypothyroidism   . Migraines   . Personal history of radiation therapy   . Plantar fasciitis, bilateral   . Pure hypercholesterolemia 10/10/2018  . Radiation 01/12/15-02/04/15   right breast 42.17 gray  . Spinal stenosis   . Thyroid disease     Past Surgical History:  Procedure Laterality Date  . BLADDER SUSPENSION    . BLEPHAROPLASTY Bilateral   . BREAST LUMPECTOMY Right 12/10/2014  . BREAST LUMPECTOMY WITH RADIOACTIVE SEED LOCALIZATION Right 12/10/2014   Procedure: RIGHT BREAST LUMPECTOMY WITH RADIOACTIVE SEED LOCALIZATION;  Surgeon: Autumn Messing III, MD;  Location: [place];  Service: General;  Laterality: Right;  . FOOT SURGERY     plantar fashiectomies from both feet  . HERNIA REPAIR    . HYSTEROSCOPY W/D&C N/A 12/27/2017   Procedure: DILATATION AND CURETTAGE /HYSTEROSCOPY;  Surgeon: Janyth Pupa, DO;  Location: [place];   Service: Gynecology;  Laterality: N/A;  . NASAL SEPTUM SURGERY    . neuromas removed from right foot    . 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 POLYPECTOMY  12/27/2017   Procedure: POLYPECTOMY;  Surgeon: Janyth Pupa, DO;  Location: [place];  Service: Gynecology;;  . removal of basal cell carcinoma (nose)    . TONSILECTOMY/ADENOIDECTOMY WITH MYRINGOTOMY    . TONSILLECTOMY      There were no vitals filed for this visit.  Subjective Assessment - 11/06/18 0903    Subjective  I had my surgery 4 years ago and I usually ignore my pain and go on but it has come to a point that it is really pain.    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    Patient Stated Goals  to get rid of the pain    Currently in Pain?  Yes    Pain Score  3     Pain Location  --   trunk   Pain Orientation  Right    Pain Descriptors / Indicators  Cramping    Pain Type  Chronic pain  Pain Onset  More than a month ago    Pain Frequency  Constant                       [facility] Adult PT Treatment/Exercise - 11/06/18 0001      Exercises   Exercises  Lumbar;Shoulder      Lumbar Exercises: Stretches   Other Lumbar Stretch Exercise  Instructed pt in Meeks decompression exercises and had her perform 5 reps of each with 3 sec holds      Shoulder Exercises: Supine   Flexion  AAROM;5 reps;Other (comment);Both   with dowel with 15 sec holds, therapist provided v/c   ABduction  Right;AAROM;5 reps;Other (comment)   with 10 sec holds, therapist provided verbal and tactile cue     Manual Therapy   Manual Therapy  Soft tissue mobilization    Soft tissue mobilization  in L sidelying to R rhomboids, serratus anterior and scapular muscles. Pt very tender at first and unable to tolerate much  pressure. Area very tight but by end of session there was marked improvement and pt stated she felt much better.                   PT Long Term Goals - 11/01/18 1252      PT LONG TERM GOAL #1   Title  Pt will report that freqency of pain incidents have decreased by 50% so that she can perform her houshold activities easier    Time  4    Period  Weeks    Status  New      PT LONG TERM GOAL #2   Title  Pt will report the level of her pain, when she does have it , has decreased to 5/10    Baseline  10/10    Time  4    Period  Weeks    Status  New      PT LONG TERM GOAL #3   Title  Pt will be independent in a home exercise program.    Time  4    Period  Weeks    Status  New      PT LONG TERM GOAL #4   Title  Pt will decrease Quick DASH score to < 20 indicating and improvment in UE function    Baseline  52.27    Time  4    Period  Weeks    Status  New            Plan - 11/06/18 [id number]    Clinical Impression Statement  Began instructing pt in Meeks decompression exercises for back pain and issued these as part of pt's HEP. Also issued supine dowel exercises and had pt return demonstrate. Soft tissue mobilization performed to right lateral trunk and scapular muscles using biotone. At beginning pt could not tolerate hardly any pressure due to tenderness and muscle tightness but this improved greatly by end of session.    PT Frequency  2x / week    PT Duration  4 weeks    PT Treatment/Interventions  ADLs/Self Care Home Management;Manual lymph drainage;Passive range of motion;Manual techniques;Therapeutic exercise;Therapeutic activities;Patient/family education;Joint Manipulations    PT Next Visit Plan  assess indep with Meeks,and supine dowel exercises, continue STM to R lateral trunk and scapular muscles- make chip pack    PT Home Exercise Plan  supine dowel,Meeks    Consulted and Agree with Plan of Care  Patient  Patient will benefit from skilled therapeutic  intervention in order to improve the following deficits and impairments:  Pain, Increased fascial restricitons, Postural dysfunction, Decreased range of motion, Decreased strength, Impaired UE functional use, Increased edema  Visit Diagnosis: Disorder of the skin and subcutaneous tissue related to radiation, unspecified  Abnormal posture  Pain in breast     Problem List Patient Active Problem List   Diagnosis Date Noted  . Pure hypercholesterolemia 10/10/2018  . Genetic testing 12/10/2014  . Breast cancer of 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 breast ([place]) 11/13/2014  . Allergic rhinoconjunctivitis 10/22/2014  . Asthma 10/22/2014    Allyson Sabal [facility] 11/06/2018, 9:58 AM  [place], [place], [address] Phone: [phone]   Fax:  [phone]  Name: Valerie Snyder MRN: [id number] Date of Birth: 05-12-1950  Allyson Sabal [facility], PT 11/06/18 9:58 AM

## 2018-11-06 NOTE — Patient Instructions (Signed)
1. Decompression Exercise     Cancer Rehab [phone]    Lie on back on firm surface, knees bent, feet flat, arms turned up, out to sides, backs of hands down. Time _5-15__ minutes. Surface: floor   2. Shoulder Press    Start in Decompression Exercise position. Press shoulders downward towards supporting surface. Hold __2-3__ seconds while counting out loud. Repeat _3-5___ times. Do _1-2___ times per day.   3. Head Press    Bring cervical spine (neck) into neutral position (by either tucking the chin towards the chest or tilting the chin upward). Feel weight on back of head. Press head downward into supporting surface.    Hold _2-3__ seconds. Repeat _3-5__ times. Do _1-2__ times per day.   4. Leg Lengthener    Straighten one leg. Pull toes AND forefoot toward knee, extend heel. Lengthen leg by pulling pelvis away from ribs. Hold _2-3__ seconds. Relax. Repeat __4-6__ times. Do other leg.  Surface: floor   5. Leg Press    Straighten one leg down to floor keeping leg aligned with hip. Pull toes AND forefoot toward knee; extend heel.  Press entire leg downward (as if pressing leg into sandy beach). DO NOT BEND KNEE. Hold _2-3__ seconds. Do __4-6__ times. Repeat with other leg.      Shoulder: Flexion (Supine)    With hands shoulder width apart, slowly lower dowel to floor behind head. Do not let elbows bend. Keep back flat. Hold _15-30___ seconds. Repeat __10__ times. Do __2__ sessions per day. CAUTION: Stretch slowly and gently.  Copyright  VHI. All rights reserved.  Shoulder: Abduction (Supine)    With right arm flat on floor, hold dowel in palm. Slowly move arm up to side of head by pushing with opposite arm. Do not let elbow bend. Hold 15-30____ seconds. Repeat _10___ times. Do __2__ sessions per day. CAUTION: Stretch slowly and gently.  Copyright  VHI. All rights reserved.

## 2018-11-07 ENCOUNTER — Ambulatory Visit (INDEPENDENT_AMBULATORY_CARE_PROVIDER_SITE_OTHER)
Admission: RE | Admit: 2018-11-07 | Discharge: 2018-11-07 | Disposition: A | Discharge status: Home / Self Care | Payer: Self-pay | Source: Ambulatory Visit | Attending: Cardiovascular Disease | Admitting: Cardiovascular Disease

## 2018-11-07 DIAGNOSIS — E7800 Pure hypercholesterolemia, unspecified: Secondary | ICD-10-CM

## 2018-11-09 ENCOUNTER — Ambulatory Visit: Payer: Medicare Other | Admitting: Physical Therapy

## 2018-11-09 ENCOUNTER — Encounter: Payer: Self-pay | Admitting: Physical Therapy

## 2018-11-09 ENCOUNTER — Other Ambulatory Visit: Payer: Self-pay

## 2018-11-09 DIAGNOSIS — L599 Disorder of the skin and subcutaneous tissue related to radiation, unspecified: Secondary | ICD-10-CM | POA: Diagnosis not present

## 2018-11-09 DIAGNOSIS — R293 Abnormal posture: Secondary | ICD-10-CM | POA: Diagnosis not present

## 2018-11-09 DIAGNOSIS — R252 Cramp and spasm: Secondary | ICD-10-CM | POA: Diagnosis not present

## 2018-11-09 DIAGNOSIS — N644 Mastodynia: Secondary | ICD-10-CM | POA: Diagnosis not present

## 2018-11-09 DIAGNOSIS — M6281 Muscle weakness (generalized): Secondary | ICD-10-CM | POA: Diagnosis not present

## 2018-11-09 NOTE — Therapy (Signed)
[place], [place], [address] Phone: [phone]   Fax:  [phone]  Physical Therapy Treatment  Patient Details  Name: Valerie Snyder MRN: [id number] Date of Birth: 1951/01/14 Referring Provider (PT): Dr. Marlou Starks   Encounter Date: 11/09/2018  PT End of Session - 11/09/18 1136    Visit Number  3    Number of Visits  9    Date for PT Re-Evaluation  12/02/18    PT Start Time  [id number]    PT Stop Time  1135    PT Time Calculation (min)  50 min    Activity Tolerance  Patient tolerated treatment well    Behavior During Therapy  [facility] for tasks assessed/performed       Past Medical History:  Diagnosis Date  . Allergy   . Asthma   . Breast cancer of 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 breast ([place]) 11/13/2014  . Complication of anesthesia    10 years ago- pt states she stopped breathing and "had to be bagged"  . Concussion 2018  . GERD (gastroesophageal reflux disease)   . H/O bilateral inguinal hernia repair 1992  . Hypercholesteremia   . Hypothyroidism   . Migraines   . Personal history of radiation therapy   . Plantar fasciitis, bilateral   . Pure hypercholesterolemia 10/10/2018  . Radiation 01/12/15-02/04/15   right breast 42.17 gray  . Spinal stenosis   . Thyroid disease     Past Surgical History:  Procedure Laterality Date  . BLADDER SUSPENSION    . BLEPHAROPLASTY Bilateral   . BREAST LUMPECTOMY Right 12/10/2014  . BREAST LUMPECTOMY WITH RADIOACTIVE SEED LOCALIZATION Right 12/10/2014   Procedure: RIGHT BREAST LUMPECTOMY WITH RADIOACTIVE SEED LOCALIZATION;  Surgeon: Autumn Messing III, MD;  Location: [place];  Service: General;  Laterality: Right;  . FOOT SURGERY     plantar fashiectomies from both feet  . HERNIA REPAIR    . HYSTEROSCOPY W/D&C N/A 12/27/2017   Procedure: DILATATION AND CURETTAGE /HYSTEROSCOPY;  Surgeon: Janyth Pupa, DO;  Location: [place];   Service: Gynecology;  Laterality: N/A;  . NASAL SEPTUM SURGERY    . neuromas removed from right foot    . 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 POLYPECTOMY  12/27/2017   Procedure: POLYPECTOMY;  Surgeon: Janyth Pupa, DO;  Location: [place];  Service: Gynecology;;  . removal of basal cell carcinoma (nose)    . TONSILECTOMY/ADENOIDECTOMY WITH MYRINGOTOMY    . TONSILLECTOMY      There were no vitals filed for this visit.  Subjective Assessment - 11/09/18 1046    Subjective  I have been doing my exercises and I notice that by the end I can move my arm up higher. My side felt better afterwards.    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    Patient Stated Goals  to get rid of the pain    Currently in Pain?  No/denies    Pain Score  0-No pain                       [facility] Adult PT Treatment/Exercise - 11/09/18 0001      Shoulder Exercises: Pulleys  Flexion  2 minutes   pt returned therapist eval   ABduction  2 minutes   pt returned therapist demo, v/c to hold stretch     Shoulder Exercises: Therapy Ball   Flexion  10 reps   with stretch at top   ABduction  10 reps;Right   pt returned therapist demo     Manual Therapy   Manual Therapy  Soft tissue mobilization;Edema management    Edema Management  created foam chip pack for pt to wear in side of bra on R side to help with comfort    Soft tissue mobilization  in R sidelying to L periscapular muscles in area of tightness and discomfort                  PT Long Term Goals - 11/01/18 1252      PT LONG TERM GOAL #1   Title  Pt will report that freqency of pain incidents have decreased by 50% so that she can perform her houshold activities easier    Time  4    Period  Weeks     Status  New      PT LONG TERM GOAL #2   Title  Pt will report the level of her pain, when she does have it , has decreased to 5/10    Baseline  10/10    Time  4    Period  Weeks    Status  New      PT LONG TERM GOAL #3   Title  Pt will be independent in a home exercise program.    Time  4    Period  Weeks    Status  New      PT LONG TERM GOAL #4   Title  Pt will decrease Quick DASH score to < 20 indicating and improvment in UE function    Baseline  52.27    Time  4    Period  Weeks    Status  New            Plan - 11/09/18 1137    Clinical Impression Statement  Pt has been compliant with her home exercise program and feels much better after she does her exercises. She has been having pain in L periscapular muscles recently so performed STM to this area. The right lateral trunk is not as sensitive today and is feeling better. Created a chip pack for pt to wear in her bra on that side. Will instruct in supine scap series at next visit.    PT Frequency  2x / week    PT Duration  4 weeks    PT Treatment/Interventions  ADLs/Self Care Home Management;Manual lymph drainage;Passive range of motion;Manual techniques;Therapeutic exercise;Therapeutic activities;Patient/family education;Joint Manipulations    PT Next Visit Plan  give supine scap exercises, STM to either R lateral trunk or L periscapular muscles which ever is bothering pt, see if chip pack helped    PT Home Exercise Plan  supine dowel,Meeks    Consulted and Agree with Plan of Care  Patient       Patient will benefit from skilled therapeutic intervention in order to improve the following deficits and impairments:  Pain, Increased fascial restricitons, Postural dysfunction, Decreased range of motion, Decreased strength, Impaired UE functional use, Increased edema  Visit Diagnosis: Disorder of the skin and subcutaneous tissue related to radiation, unspecified  Abnormal posture  Pain in breast     Problem  List Patient Active  Problem List   Diagnosis Date Noted  . Pure hypercholesterolemia 10/10/2018  . Genetic testing 12/10/2014  . Breast cancer of 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 breast ([place]) 11/13/2014  . Allergic rhinoconjunctivitis 10/22/2014  . Asthma 10/22/2014    Allyson Sabal [facility] 11/09/2018, 11:40 AM  [place], [place], [address] Phone: [phone]   Fax:  [phone]  Name: Valerie Snyder MRN: [id number] Date of Birth: November 01, 1950  Manus Gunning, PT 11/09/18 11:40 AM

## 2018-11-12 ENCOUNTER — Other Ambulatory Visit: Payer: Self-pay

## 2018-11-12 ENCOUNTER — Ambulatory Visit: Payer: Medicare Other

## 2018-11-12 DIAGNOSIS — N644 Mastodynia: Secondary | ICD-10-CM

## 2018-11-12 DIAGNOSIS — L599 Disorder of the skin and subcutaneous tissue related to radiation, unspecified: Secondary | ICD-10-CM

## 2018-11-12 DIAGNOSIS — R293 Abnormal posture: Secondary | ICD-10-CM | POA: Diagnosis not present

## 2018-11-12 DIAGNOSIS — M6281 Muscle weakness (generalized): Secondary | ICD-10-CM | POA: Diagnosis not present

## 2018-11-12 DIAGNOSIS — R252 Cramp and spasm: Secondary | ICD-10-CM | POA: Diagnosis not present

## 2018-11-12 NOTE — Therapy (Signed)
Valerie Snyder, [place], [place] Phone: [phone]   Fax:  [phone]  Physical Therapy Treatment  Patient Details  Name: Valerie Snyder MRN: [id number] Date of Birth: 08/11/1950 Referring Provider (PT): Dr. Marlou Starks   Encounter Date: 11/12/2018  PT End of Session - 11/12/18 1212    Visit Number  4    Number of Visits  9    Date for PT Re-Evaluation  12/02/18    PT Start Time  1102    PT Stop Time  1153    PT Time Calculation (min)  51 min    Activity Tolerance  Patient tolerated treatment well    Behavior During Therapy  [facility] for tasks assessed/performed       Past Medical History:  Diagnosis Date  . Allergy   . Asthma   . Breast cancer of 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 breast ([place]) 11/13/2014  . Complication of anesthesia    10 years ago- pt states she stopped breathing and "had to be bagged"  . Concussion 2018  . GERD (gastroesophageal reflux disease)   . H/O bilateral inguinal hernia repair 1992  . Hypercholesteremia   . Hypothyroidism   . Migraines   . Personal history of radiation therapy   . Plantar fasciitis, bilateral   . Pure hypercholesterolemia 10/10/2018  . Radiation 01/12/15-02/04/15   right breast 42.17 gray  . Spinal stenosis   . Thyroid disease     Past Surgical History:  Procedure Laterality Date  . BLADDER SUSPENSION    . BLEPHAROPLASTY Bilateral   . BREAST LUMPECTOMY Right 12/10/2014  . BREAST LUMPECTOMY WITH RADIOACTIVE SEED LOCALIZATION Right 12/10/2014   Procedure: RIGHT BREAST LUMPECTOMY WITH RADIOACTIVE SEED LOCALIZATION;  Surgeon: Autumn Messing III, MD;  Location: [place];  Service: General;  Laterality: Right;  . FOOT SURGERY     plantar fashiectomies from both feet  . HERNIA REPAIR    . HYSTEROSCOPY W/D&C N/A 12/27/2017   Procedure: DILATATION AND CURETTAGE /HYSTEROSCOPY;  Surgeon: Janyth Pupa, DO;  Location: [place];   Service: Gynecology;  Laterality: N/A;  . NASAL SEPTUM SURGERY    . neuromas removed from right foot    . 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 POLYPECTOMY  12/27/2017   Procedure: POLYPECTOMY;  Surgeon: Janyth Pupa, DO;  Location: [place];  Service: Gynecology;;  . removal of basal cell carcinoma (nose)    . TONSILECTOMY/ADENOIDECTOMY WITH MYRINGOTOMY    . TONSILLECTOMY      There were no vitals filed for this visit.  Subjective Assessment - 11/12/18 1105    Subjective  I've been doing my exercises diligently and they are going well. I can tell my Rt shoulder is getting looser.    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    Patient Stated Goals  to get rid of the pain    Currently in Pain?  No/denies                       [facility] Adult PT Treatment/Exercise - 11/12/18 0001      Self-Care   Other Self-Care Comments   Spent time today answering pts questions about how her tightness  occured seemingly out of no where. 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 so educated pt that even though she has done well with being active and exercising daily, she could be weak in her postural muscles and core from her previous jobs and is still very active. Encouraged her to also look into pilates or some type of core strengthening program and she verbalized understanding.       Shoulder Exercises: Supine   Horizontal ABduction  Strengthening;Both;10 reps;Theraband    Theraband Level (Shoulder Horizontal ABduction)  Level 1 (Yellow)     External Rotation  Strengthening;Both;10 reps;Theraband    Theraband Level (Shoulder External Rotation)  Level 1 (Yellow)    Flexion  Strengthening;Both;10 reps;Theraband   Narrow and Wide Grip, 10 times each   Theraband Level (Shoulder Flexion)  Level 1 (Yellow)    Diagonals  Strengthening;Right;Left;10 reps;Theraband    Theraband Level (Shoulder Diagonals)  Level 1 (Yellow)    Diagonals Limitations  Pt returned therapist demo for all above supine exercise with tactile cuing for technique      Shoulder Exercises: Pulleys   Flexion  2 minutes    Flexion Limitations  VCs to hold stretch 3-4 sec    ABduction  2 minutes    ABduction Limitations  Tactile and VCs to decrease Rt scapular compensation      Shoulder Exercises: Therapy Ball   Flexion  Both;10 reps   forward lean into end of stretch   ABduction  Right;10 reps   same side lean into end of stretch     Manual Therapy   Manual Therapy  Passive ROM;Myofascial release    Soft tissue mobilization  --    Myofascial Release  To Rt axilla during P/ROM    Passive ROM  In Supine to bil shoulders into flexion and abduction to pts available end ROM. She is limited in both directions by tissue tightness and discomfort             PT Education - 11/12/18 1126    Education Details  Supien scapular series with yellow theraband; doorway pectoralis stretch    Person(s) Educated  Patient    Methods  Explanation;Demonstration;Handout    Comprehension  Verbalized understanding;Returned demonstration;Need further instruction          PT Long Term Goals - 11/01/18 1252      PT LONG TERM GOAL #1   Title  Pt will report that freqency of pain incidents have decreased by 50% so that she can perform her houshold activities easier    Time  4    Period  Weeks    Status  New      PT LONG TERM GOAL #2   Title  Pt will report the level of her pain, when she does have it , has decreased to 5/10    Baseline  10/10    Time  4    Period   Weeks    Status  New      PT LONG TERM GOAL #3   Title  Pt will be independent in a home exercise program.    Time  4    Period  Weeks    Status  New      PT LONG TERM GOAL #  4   Title  Pt will decrease Quick DASH score to < 20 indicating and improvment in UE function    Baseline  52.27    Time  4    Period  Weeks    Status  New            Plan - 11/12/18 1212    Clinical Impression Statement  Pt repors noticing that her Lt periscapular area has decreased in tension lately and wanted P/ROM of bil shoulders as she feels they are always tight. She was very limited at bil end ROM of flexion and abduction and reports feeling tightness down into Rt breast where she is tighter. Pt reports feeling some looser at end of session. Progressed HEP to include supine scapular series and doorway pectoralis stretch. Also spent time today educating pt on the importance of good body mechanics with her hobbies and ADLs in ansewr to her question of how did this tightness come about. (see flowsheet).    Personal Factors and Comorbidities  Comorbidity 3+    Comorbidities  radiation, arthitis, tendonitis    Rehab Potential  Good    PT Frequency  2x / week    PT Duration  4 weeks    PT Treatment/Interventions  ADLs/Self Care Home Management;Manual lymph drainage;Passive range of motion;Manual techniques;Therapeutic exercise;Therapeutic activities;Patient/family education;Joint Manipulations    PT Next Visit Plan  review supine scap exercises and doorway stretch, cont STM prn to Lt periscapular or Rt lateral trunk; cont P/ROM to Rt>Lt shoulder, see if pt wants smaller chip pack (she mentioned it doesn't stay in her bra well)    PT Home Exercise Plan  supine dowel,Meeks; supine scapular series and doorway stretch    Consulted and Agree with Plan of Care  Patient       Patient will benefit from skilled therapeutic intervention in order to improve the following deficits and impairments:  Pain, Increased  fascial restricitons, Postural dysfunction, Decreased range of motion, Decreased strength, Impaired UE functional use, Increased edema  Visit Diagnosis: Disorder of the skin and subcutaneous tissue related to radiation, unspecified  Abnormal posture  Pain in breast     Problem List Patient Active Problem List   Diagnosis Date Noted  . Pure hypercholesterolemia 10/10/2018  . Genetic testing 12/10/2014  . Breast cancer of 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 breast ([place]) 11/13/2014  . Allergic rhinoconjunctivitis 10/22/2014  . Asthma 10/22/2014    Otelia Limes, PTA 11/12/2018, 12:22 PM  [place], [place], [address] Phone: [phone]   Fax:  [phone]  Name: Rosealie Paxson MRN: [id number] Date of Birth: 1950-03-20

## 2018-11-12 NOTE — Patient Instructions (Addendum)
Over Head Pull: Narrow and Wide Grip   Cancer Rehab [phone]   On back, knees bent, feet flat, band across thighs, elbows straight but relaxed. Pull hands apart (start). Keeping elbows straight, bring arms up and over head, hands toward floor. Keep pull steady on band. Hold momentarily. Return slowly, keeping pull steady, back to start. Then do same with a wider grip on the band (past shoulder width) Repeat _5-10__ times. Band color __yellow____   Side Pull: Double Arm   On back, knees bent, feet flat. Arms perpendicular to body, shoulder level, elbows straight but relaxed. Pull arms out to sides, elbows straight. Resistance band comes across collarbones, hands toward floor. Hold momentarily. Slowly return to starting position. Repeat _5-10__ times. Band color _yellow____   Sword   On back, knees bent, feet flat, left hand on left hip, right hand above left. Pull right arm DIAGONALLY (hip to shoulder) across chest. Bring right arm along head toward floor. Hold momentarily. Slowly return to starting position. Repeat _5-10__ times. Do with left arm. Band color _yellow_____   Shoulder Rotation: Double Arm   On back, knees bent, feet flat, elbows tucked at sides, bent 90, hands palms up. Pull hands apart and down toward floor, keeping elbows near sides. Hold momentarily. Slowly return to starting position. Repeat _5-10__ times. Band color __yellow____    CHEST: Doorway, Bilateral - Standing    Standing in doorway with one foot in front of the other, place hands on wall with elbows bent at shoulder height. Lean forward. Hold _20-30__ seconds. __3_ reps per set, __3_ sets per day

## 2018-11-13 ENCOUNTER — Ambulatory Visit: Payer: Medicare Other | Admitting: Physical Therapy

## 2018-11-13 ENCOUNTER — Encounter: Payer: Self-pay | Admitting: Physical Therapy

## 2018-11-13 DIAGNOSIS — M6281 Muscle weakness (generalized): Secondary | ICD-10-CM | POA: Diagnosis not present

## 2018-11-13 DIAGNOSIS — L599 Disorder of the skin and subcutaneous tissue related to radiation, unspecified: Secondary | ICD-10-CM

## 2018-11-13 DIAGNOSIS — R293 Abnormal posture: Secondary | ICD-10-CM | POA: Diagnosis not present

## 2018-11-13 DIAGNOSIS — R252 Cramp and spasm: Secondary | ICD-10-CM | POA: Diagnosis not present

## 2018-11-13 DIAGNOSIS — N644 Mastodynia: Secondary | ICD-10-CM | POA: Diagnosis not present

## 2018-11-13 NOTE — Therapy (Signed)
[place], [place], [address] Phone: [phone]   Fax:  [phone]  Physical Therapy Treatment  Patient Details  Name: Valerie Snyder MRN: [id number] Date of Birth: [birth year] Referring Provider (PT): Dr. Marlou Starks   Encounter Date: 11/13/2018  PT End of Session - 11/13/18 1706    Visit Number  5    Number of Visits  9    Date for PT Re-Evaluation  12/02/18    PT Start Time  1600    PT Stop Time  1645    PT Time Calculation (min)  45 min    Activity Tolerance  Patient tolerated treatment well    Behavior During Therapy  [facility] for tasks assessed/performed       Past Medical History:  Diagnosis Date  . Allergy   . Asthma   . Breast cancer of 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 breast ([place]) 11/13/2014  . Complication of anesthesia    10 years ago- pt states she stopped breathing and "had to be bagged"  . Concussion 2018  . GERD (gastroesophageal reflux disease)   . H/O bilateral inguinal hernia repair 1992  . Hypercholesteremia   . Hypothyroidism   . Migraines   . Personal history of radiation therapy   . Plantar fasciitis, bilateral   . Pure hypercholesterolemia 10/10/2018  . Radiation 01/12/15-02/04/15   right breast 42.17 gray  . Spinal stenosis   . Thyroid disease     Past Surgical History:  Procedure Laterality Date  . BLADDER SUSPENSION    . BLEPHAROPLASTY Bilateral   . BREAST LUMPECTOMY Right 12/10/2014  . BREAST LUMPECTOMY WITH RADIOACTIVE SEED LOCALIZATION Right 12/10/2014   Procedure: RIGHT BREAST LUMPECTOMY WITH RADIOACTIVE SEED LOCALIZATION;  Surgeon: Autumn Messing III, MD;  Location: [place];  Service: General;  Laterality: Right;  . FOOT SURGERY     plantar fashiectomies from both feet  . HERNIA REPAIR    . HYSTEROSCOPY W/D&C N/A 12/27/2017   Procedure: DILATATION AND CURETTAGE /HYSTEROSCOPY;  Surgeon: Janyth Pupa, DO;  Location: [facility];   Service: Gynecology;  Laterality: N/A;  . NASAL SEPTUM SURGERY    . neuromas removed from right foot    . 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 POLYPECTOMY  12/27/2017   Procedure: POLYPECTOMY;  Surgeon: Janyth Pupa, DO;  Location: [place];  Service: Gynecology;;  . removal of basal cell carcinoma (nose)    . TONSILECTOMY/ADENOIDECTOMY WITH MYRINGOTOMY    . TONSILLECTOMY      There were no vitals filed for this visit.  Subjective Assessment - 11/13/18 1659    Subjective  Pt reports she has been doing well with the exercises. She reports today about symptoms in her left lower thoracic area that sound like a spasm .    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    Patient Stated Goals  to get rid of the pain    Currently in Pain?  No/denies                       [facility] Adult PT Treatment/Exercise - 11/13/18 0001      Self-Care   Other Self-Care Comments   small dotted foam  to wear in bra at [place] major area       Lumbar Exercises: Supine   Other Supine Lumbar Exercises  lower trunk rotation with arms in goal post position       Shoulder Exercises: Supine   Other Supine Exercises  in supine over purple ball at thoracic area with alternating arm elevation and passive chest opening     Other Supine Exercises  in supine, opposite arm and leg extension for diagonal stretch       Manual Therapy   Soft tissue mobilization  in supine and sidelying to right pec major and posterior axillay and shoulder muscles with prolonged pressure for release of trigger points                   PT Long Term Goals - 11/01/18 1252      PT LONG TERM GOAL #1   Title  Pt will report that freqency of pain incidents have decreased by 50% so that she can perform  her houshold activities easier    Time  4    Period  Weeks    Status  New      PT LONG TERM GOAL #2   Title  Pt will report the level of her pain, when she does have it , has decreased to 5/10    Baseline  10/10    Time  4    Period  Weeks    Status  New      PT LONG TERM GOAL #3   Title  Pt will be independent in a home exercise program.    Time  4    Period  Weeks    Status  New      PT LONG TERM GOAL #4   Title  Pt will decrease Quick DASH score to < 20 indicating and improvment in UE function    Baseline  52.27    Time  4    Period  Weeks    Status  New            Plan - 11/13/18 1706    Clinical Impression Statement  Pt had release of tightness of right pec major muscle with soft tissue work today. she felt looser after session    Rehab Potential  Good    PT Frequency  2x / week    PT Duration  4 weeks    PT Treatment/Interventions  ADLs/Self Care Home Management;Manual lymph drainage;Passive range of motion;Manual techniques;Therapeutic exercise;Therapeutic activities;Patient/family education;Joint Manipulations    PT Next Visit Plan  review supine scap exercises and doorway stretch, cont STM prn to Lt periscapular or Rt lateral trunk; cont P/ROM and stretching to Rt>Lt shoulder, see if pt wants smaller chip pack (she mentioned it doesn't stay in her bra well)       Patient will benefit from skilled therapeutic intervention in order to improve the following deficits and impairments:  Pain, Increased fascial restricitons, Postural dysfunction, Decreased range of motion, Decreased strength, Impaired UE functional use, Increased edema  Visit Diagnosis: Disorder of the skin and subcutaneous tissue related to radiation, unspecified  Abnormal posture  Pain in breast  Cramp and spasm     Problem List Patient Active Problem List   Diagnosis Date Noted  . Pure hypercholesterolemia 10/10/2018  . Genetic testing 12/10/2014  . Breast cancer of upper-inner  quadrant of right female breast ([place]) 11/13/2014  . Allergic rhinoconjunctivitis 10/22/2014  . Asthma 10/22/2014   Valerie Snyder. Owens Shark,  PT  [facility]o 11/13/2018, 5:09 PM  [place], [place], [address] Phone: [phone]   Fax:  [phone]  Name: Valerie Snyder MRN: [id number] Date of Birth: 19-Sep-1950

## 2018-11-20 ENCOUNTER — Ambulatory Visit: Payer: Medicare Other | Admitting: Physical Therapy

## 2018-11-20 ENCOUNTER — Encounter: Payer: Self-pay | Admitting: Physical Therapy

## 2018-11-20 ENCOUNTER — Other Ambulatory Visit: Payer: Self-pay

## 2018-11-20 DIAGNOSIS — R293 Abnormal posture: Secondary | ICD-10-CM

## 2018-11-20 DIAGNOSIS — R252 Cramp and spasm: Secondary | ICD-10-CM

## 2018-11-20 DIAGNOSIS — N644 Mastodynia: Secondary | ICD-10-CM

## 2018-11-20 DIAGNOSIS — L599 Disorder of the skin and subcutaneous tissue related to radiation, unspecified: Secondary | ICD-10-CM

## 2018-11-20 DIAGNOSIS — M6281 Muscle weakness (generalized): Secondary | ICD-10-CM | POA: Diagnosis not present

## 2018-11-20 NOTE — Therapy (Signed)
[place], [place], [address] Phone: [phone]   Fax:  [phone]  Physical Therapy Treatment  Patient Details  Name: Valerie Snyder MRN: [id number] Date of Birth: October 29, 1950 Referring Provider (PT): Dr. Marlou Starks   Encounter Date: 11/20/2018  PT End of Session - 11/20/18 1606    Visit Number  6    Number of Visits  9    Date for PT Re-Evaluation  12/02/18    PT Start Time  1500    PT Stop Time  1545    PT Time Calculation (min)  45 min    Activity Tolerance  Patient tolerated treatment well    Behavior During Therapy  [facility] for tasks assessed/performed       Past Medical History:  Diagnosis Date  . Allergy   . Asthma   . Breast cancer of 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 breast ([place]) 11/13/2014  . Complication of anesthesia    10 years ago- pt states she stopped breathing and "had to be bagged"  . Concussion 2018  . GERD (gastroesophageal reflux disease)   . H/O bilateral inguinal hernia repair 1992  . Hypercholesteremia   . Hypothyroidism   . Migraines   . Personal history of radiation therapy   . Plantar fasciitis, bilateral   . Pure hypercholesterolemia 10/10/2018  . Radiation 01/12/15-02/04/15   right breast 42.17 gray  . Spinal stenosis   . Thyroid disease     Past Surgical History:  Procedure Laterality Date  . BLADDER SUSPENSION    . BLEPHAROPLASTY Bilateral   . BREAST LUMPECTOMY Right 12/10/2014  . BREAST LUMPECTOMY WITH RADIOACTIVE SEED LOCALIZATION Right 12/10/2014   Procedure: RIGHT BREAST LUMPECTOMY WITH RADIOACTIVE SEED LOCALIZATION;  Surgeon: Autumn Messing III, MD;  Location: [place];  Service: General;  Laterality: Right;  . FOOT SURGERY     plantar fashiectomies from both feet  . HERNIA REPAIR    . HYSTEROSCOPY W/D&C N/A 12/27/2017   Procedure: DILATATION AND CURETTAGE /HYSTEROSCOPY;  Surgeon: Janyth Pupa, DO;  Location: [facility];   Service: Gynecology;  Laterality: N/A;  . NASAL SEPTUM SURGERY    . neuromas removed from right foot    . 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 POLYPECTOMY  12/27/2017   Procedure: POLYPECTOMY;  Surgeon: Janyth Pupa, DO;  Location: [place] [facility];  Service: Gynecology;;  . removal of basal cell carcinoma (nose)    . TONSILECTOMY/ADENOIDECTOMY WITH MYRINGOTOMY    . TONSILLECTOMY      There were no vitals filed for this visit.  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    Patient Stated Goals  to get rid of the pain    Currently in Pain?  No/denies   does have some pain with reaching and twisting                      [facility] Adult PT Treatment/Exercise - 11/20/18 0001      Shoulder Exercises: Supine   Other Supine Exercises  reviewed Meeks deompression exercises       Shoulder Exercises: Sidelying  ABduction  AROM;Right      Shoulder Exercises: Standing   Horizontal ABduction  Strengthening;Right;Left;10 reps;Theraband    Theraband Level (Shoulder Horizontal ABduction)  Level 2 (Red)    External Rotation  Strengthening;Right;Left;10 reps;Theraband    Theraband Level (Shoulder External Rotation)  Level 2 (Red)    Flexion  Strengthening;Right;Left;10 reps;Theraband    Theraband Level (Shoulder Flexion)  Level 2 (Red)    Flexion Limitations  wide and narrow grip     Diagonals  Strengthening;Right;Left;10 reps;Theraband    Theraband Level (Shoulder Diagonals)  Level 2 (Red)    Diagonals Limitations  cues to activate core for all standing exercises       Manual Therapy   Soft tissue mobilization  in supine and sidelying to right pec major and posterior axillay  and shoulder muscles with prolonged pressure for release of trigger points              PT Education - 11/20/18 1600    Education Details  upgraded scapular exercises to standing and with red theraband, reviewed Meeks    Person(s) Educated  Patient    Methods  Explanation;Demonstration    Comprehension  Verbalized understanding;Returned demonstration          PT Long Term Goals - 11/20/18 1507      PT LONG TERM GOAL #1   Title  Pt will report that freqency of pain incidents have decreased by 50% so that she can perform her houshold activities easier    Baseline  11/20/2018 Pt says she has improved    Status  Achieved      PT LONG TERM GOAL #2   Title  Pt will report the level of her pain, when she does have it , has decreased to 5/10    Baseline  10/10 on eval, On 11/20/2018, she had pain but it was much less    Status  Achieved      PT LONG TERM GOAL #3   Title  Pt will be independent in a home exercise program.    Status  Achieved      PT LONG TERM GOAL #4   Title  Pt will decrease Quick DASH score to < 20 indicating and improvment in UE function    Baseline  52.27    Time  4    Period  Weeks    Status  On-going            Plan - 11/20/18 1553    Clinical Impression Statement  Pt is doing very well with execises. Upgraded series to standing and with red theraband today.  She does not need further review of exercises, but she still has tightness in soft tissue of right posterior shoulder and axilla.  Recommend she keep coming for the rest of her visits just for soft tissue work and myofascial release to this area    Personal Factors and Comorbidities  Comorbidity 3+    Comorbidities  radiation, arthitis, tendonitis    PT Frequency  2x / week    PT Duration  4 weeks    PT Treatment/Interventions  ADLs/Self Care Home Management;Manual lymph drainage;Passive range of motion;Manual techniques;Therapeutic exercise;Therapeutic activities;Patient/family  education;Joint Manipulations    PT Next Visit Plan  soft tissue work and myofascial relase to right posterior shoulder, axilla, upper trap and scapular area    Consulted and Agree with Plan of Care  Patient       Patient will benefit from skilled therapeutic intervention in order  to improve the following deficits and impairments:  Pain, Increased fascial restricitons, Postural dysfunction, Decreased range of motion, Decreased strength, Impaired UE functional use, Increased edema  Visit Diagnosis: Disorder of the skin and subcutaneous tissue related to radiation, unspecified  Abnormal posture  Pain in breast  Cramp and spasm     Problem List Patient Active Problem List   Diagnosis Date Noted  . Pure hypercholesterolemia 10/10/2018  . Genetic testing 12/10/2014  . Breast cancer of 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 breast ([place]) 11/13/2014  . Allergic rhinoconjunctivitis 10/22/2014  . Asthma 10/22/2014   Donato Heinz. Owens Shark PT  [facility]o 11/20/2018, 4:08 PM  [place], [place], [address] Phone: [phone]   Fax:  [phone]  Name: Valerie Snyder MRN: [id number] Date of Birth: 01-15-1951

## 2018-11-22 ENCOUNTER — Ambulatory Visit
Admission: RE | Admit: 2018-11-22 | Discharge: 2018-11-22 | Disposition: A | Discharge status: Home / Self Care | Payer: Medicare Other | Source: Ambulatory Visit | Attending: Hematology and Oncology | Admitting: Hematology and Oncology

## 2018-11-22 ENCOUNTER — Other Ambulatory Visit: Payer: Self-pay

## 2018-11-22 DIAGNOSIS — R922 Inconclusive mammogram: Secondary | ICD-10-CM | POA: Diagnosis not present

## 2018-11-22 DIAGNOSIS — Z853 Personal history of malignant neoplasm of breast: Secondary | ICD-10-CM

## 2018-11-23 ENCOUNTER — Other Ambulatory Visit: Payer: Self-pay

## 2018-11-23 ENCOUNTER — Encounter: Payer: Self-pay | Admitting: Physical Therapy

## 2018-11-23 ENCOUNTER — Ambulatory Visit: Payer: Medicare Other | Admitting: Physical Therapy

## 2018-11-23 ENCOUNTER — Telehealth: Payer: Self-pay | Admitting: *Deleted

## 2018-11-23 DIAGNOSIS — L599 Disorder of the skin and subcutaneous tissue related to radiation, unspecified: Secondary | ICD-10-CM

## 2018-11-23 DIAGNOSIS — R293 Abnormal posture: Secondary | ICD-10-CM | POA: Diagnosis not present

## 2018-11-23 DIAGNOSIS — M6281 Muscle weakness (generalized): Secondary | ICD-10-CM | POA: Diagnosis not present

## 2018-11-23 DIAGNOSIS — N644 Mastodynia: Secondary | ICD-10-CM | POA: Diagnosis not present

## 2018-11-23 DIAGNOSIS — R252 Cramp and spasm: Secondary | ICD-10-CM | POA: Diagnosis not present

## 2018-11-23 NOTE — Telephone Encounter (Signed)
Follow up ° ° °Patient is returning call per the previous message. Please call. °

## 2018-11-23 NOTE — Telephone Encounter (Signed)
-----   Message from Skeet Latch, MD sent at 11/14/2018  1:10 PM EDT ----- CT showed that she does have some calcium in her coronary arteries, which means that there is some plaque.  The calcium score was 96, which is 72nd percentile for age and gender.  This means that 71% of women her age would have less plaque than she has and 28% of women would have more.  Given this and her family history I would recommend that we should aim for an LDL cholesterol of less than 70.  Please come and have the fasting lipids checked so that we can decide what to do next.

## 2018-11-23 NOTE — Telephone Encounter (Signed)
Advised patient, verbalized understanding  

## 2018-11-23 NOTE — Therapy (Signed)
[place], [place], [address] Phone: [phone]   Fax:  [phone]  Physical Therapy Treatment  Patient Details  Name: Valerie Snyder MRN: [id number] Date of Birth: [birth year] Referring Provider (PT): Dr. Marlou Starks   Encounter Date: 11/23/2018  PT End of Session - 11/23/18 1137    Visit Number  7    Number of Visits  9    Date for PT Re-Evaluation  12/02/18    PT Start Time  [id number]    PT Stop Time  1135    PT Time Calculation (min)  52 min    Activity Tolerance  Patient tolerated treatment well    Behavior During Therapy  [facility] for tasks assessed/performed       Past Medical History:  Diagnosis Date  . Allergy   . Asthma   . Breast cancer of 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 breast ([place]) 11/13/2014  . Complication of anesthesia    10 years ago- pt states she stopped breathing and "had to be bagged"  . Concussion 2018  . GERD (gastroesophageal reflux disease)   . H/O bilateral inguinal hernia repair 1992  . Hypercholesteremia   . Hypothyroidism   . Migraines   . Personal history of radiation therapy   . Plantar fasciitis, bilateral   . Pure hypercholesterolemia 10/10/2018  . Radiation 01/12/15-02/04/15   right breast 42.17 gray  . Spinal stenosis   . Thyroid disease     Past Surgical History:  Procedure Laterality Date  . BLADDER SUSPENSION    . BLEPHAROPLASTY Bilateral   . BREAST LUMPECTOMY Right 12/10/2014  . BREAST LUMPECTOMY WITH RADIOACTIVE SEED LOCALIZATION Right 12/10/2014   Procedure: RIGHT BREAST LUMPECTOMY WITH RADIOACTIVE SEED LOCALIZATION;  Surgeon: Autumn Messing III, MD;  Location: [facility];  Service: General;  Laterality: Right;  . FOOT SURGERY     plantar fashiectomies from both feet  . HERNIA REPAIR    . HYSTEROSCOPY W/D&C N/A 12/27/2017   Procedure: DILATATION AND CURETTAGE /HYSTEROSCOPY;  Surgeon: Janyth Pupa, DO;  Location: [place];   Service: Gynecology;  Laterality: N/A;  . NASAL SEPTUM SURGERY    . neuromas removed from right foot    . 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 POLYPECTOMY  12/27/2017   Procedure: POLYPECTOMY;  Surgeon: Janyth Pupa, DO;  Location: [place];  Service: Gynecology;;  . removal of basal cell carcinoma (nose)    . TONSILECTOMY/ADENOIDECTOMY WITH MYRINGOTOMY    . TONSILLECTOMY      There were no vitals filed for this visit.  Subjective Assessment - 11/23/18 1137    Subjective  am doing my exercises every day.    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    Patient Stated Goals  to get rid of the pain    Currently in Pain?  No/denies    Pain Score  0-No pain                       [facility] Adult PT Treatment/Exercise - 11/23/18 0001      Manual Therapy   Soft tissue mobilization  in L sidelying to R periscapular area, upper traps and serratus anterior, increased tightness palpated especially at serratus  anterior that released by end of session                  PT Long Term Goals - 11/20/18 1507      PT LONG TERM GOAL #1   Title  Pt will report that freqency of pain incidents have decreased by 50% so that she can perform her houshold activities easier    Baseline  11/20/2018 Pt says she has improved    Status  Achieved      PT LONG TERM GOAL #2   Title  Pt will report the level of her pain, when she does have it , has decreased to 5/10    Baseline  10/10 on eval, On 11/20/2018, she had pain but it was much less    Status  Achieved      PT LONG TERM GOAL #3   Title  Pt will be independent in a home exercise program.    Status  Achieved      PT LONG TERM GOAL #4   Title  Pt will decrease Quick DASH score to < 20 indicating and improvment in UE  function    Baseline  52.27    Time  4    Period  Weeks    Status  On-going            Plan - 11/23/18 1138    Clinical Impression Statement  Continued soft tissue mobilization to right periscapular muscles, upper traps and serratus. Pt has incrased tightness especially in area of serratus that released with soft tissue work today and pt felt much better by end of session.    PT Frequency  2x / week    PT Duration  4 weeks    PT Treatment/Interventions  ADLs/Self Care Home Management;Manual lymph drainage;Passive range of motion;Manual techniques;Therapeutic exercise;Therapeutic activities;Patient/family education;Joint Manipulations    PT Next Visit Plan  soft tissue work and myofascial relase to right posterior shoulder, axilla, upper trap and scapular area    PT Home Exercise Plan  supine dowel,Meeks; supine scapular series and doorway stretch    Consulted and Agree with Plan of Care  Patient       Patient will benefit from skilled therapeutic intervention in order to improve the following deficits and impairments:  Pain, Increased fascial restricitons, Postural dysfunction, Decreased range of motion, Decreased strength, Impaired UE functional use, Increased edema  Visit Diagnosis: Disorder of the skin and subcutaneous tissue related to radiation, unspecified  Cramp and spasm     Problem List Patient Active Problem List   Diagnosis Date Noted  . Pure hypercholesterolemia 10/10/2018  . Genetic testing 12/10/2014  . Breast cancer of 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 breast ([place]) 11/13/2014  . Allergic rhinoconjunctivitis 10/22/2014  . Asthma 10/22/2014    Allyson Sabal [facility] 11/23/2018, 11:40 AM  [place], [place], [address] Phone: [phone]   Fax:  [phone]  Name: Valerie Snyder MRN: [id number] Date of Birth: November 27, 1950  Manus Gunning, PT 11/23/18 11:40 AM

## 2018-11-27 ENCOUNTER — Ambulatory Visit: Payer: Medicare Other | Admitting: Physical Therapy

## 2018-11-27 ENCOUNTER — Other Ambulatory Visit: Payer: Self-pay

## 2018-11-27 DIAGNOSIS — N644 Mastodynia: Secondary | ICD-10-CM | POA: Diagnosis not present

## 2018-11-27 DIAGNOSIS — R252 Cramp and spasm: Secondary | ICD-10-CM | POA: Diagnosis not present

## 2018-11-27 DIAGNOSIS — R293 Abnormal posture: Secondary | ICD-10-CM | POA: Diagnosis not present

## 2018-11-27 DIAGNOSIS — M6281 Muscle weakness (generalized): Secondary | ICD-10-CM | POA: Diagnosis not present

## 2018-11-27 DIAGNOSIS — L599 Disorder of the skin and subcutaneous tissue related to radiation, unspecified: Secondary | ICD-10-CM | POA: Diagnosis not present

## 2018-11-27 NOTE — Patient Instructions (Signed)
First of all, check with your insurance company to see if provider is in [place] (for wigs and compression sleeves / gloves/gauntlets )  [place], [place] [address] [id number]  Will file some insurances --- call for appointment   Second to [facility] (for mastectomy prosthetics and garments) [place], [place] [address] [phone] Will file some insurances --- call for appointment    Look at [place] [facility] [place] ( I think you will like the [place])  Also look at [facility] and [place]  [address]  [place], [place] [address] [phone] Lower extremity garments  [facility] [place] [address] [place], [place]  [address] [place] ( Medicaid certified lymphedema fitter) [phone] [email or web address]  [place]  [place] [address], [place] [address] [phone]  Other Resources: National Lymphedema Network:  www.lymphnet.org www.Klosetraining.com for patient articles and self manual lymph drainage information www.lymphedemablog.com has informative articles.  [email or web address] [email or web address] [email or web address]

## 2018-11-27 NOTE — Therapy (Signed)
[place], [place], [address] Phone: [phone]   Fax:  [phone]  Physical Therapy Treatment  Patient Details  Name: Valerie Snyder MRN: [id number] Date of Birth: 1950/07/17 Referring Provider (PT): Dr. Marlou Starks   Encounter Date: 11/27/2018  PT End of Session - 11/27/18 1629    Visit Number  8    Number of Visits  9    Date for PT Re-Evaluation  12/02/18    PT Start Time  1300    PT Stop Time  1345    PT Time Calculation (min)  45 min    Activity Tolerance  Patient tolerated treatment well    Behavior During Therapy  [facility] for tasks assessed/performed       Past Medical History:  Diagnosis Date  . Allergy   . Asthma   . Breast cancer of 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 breast ([place]) 11/13/2014  . Complication of anesthesia    10 years ago- pt states she stopped breathing and "had to be bagged"  . Concussion 2018  . GERD (gastroesophageal reflux disease)   . H/O bilateral inguinal hernia repair 1992  . Hypercholesteremia   . Hypothyroidism   . Migraines   . Personal history of radiation therapy   . Plantar fasciitis, bilateral   . Pure hypercholesterolemia 10/10/2018  . Radiation 01/12/15-02/04/15   right breast 42.17 gray  . Spinal stenosis   . Thyroid disease     Past Surgical History:  Procedure Laterality Date  . BLADDER SUSPENSION    . BLEPHAROPLASTY Bilateral   . BREAST LUMPECTOMY Right 12/10/2014  . BREAST LUMPECTOMY WITH RADIOACTIVE SEED LOCALIZATION Right 12/10/2014   Procedure: RIGHT BREAST LUMPECTOMY WITH RADIOACTIVE SEED LOCALIZATION;  Surgeon: Autumn Messing III, MD;  Location: [place];  Service: General;  Laterality: Right;  . FOOT SURGERY     plantar fashiectomies from both feet  . HERNIA REPAIR    . HYSTEROSCOPY W/D&C N/A 12/27/2017   Procedure: DILATATION AND CURETTAGE /HYSTEROSCOPY;  Surgeon: Janyth Pupa, DO;  Location: [place];   Service: Gynecology;  Laterality: N/A;  . NASAL SEPTUM SURGERY    . neuromas removed from right foot    . 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 POLYPECTOMY  12/27/2017   Procedure: POLYPECTOMY;  Surgeon: Janyth Pupa, DO;  Location: [place];  Service: Gynecology;;  . removal of basal cell carcinoma (nose)    . TONSILECTOMY/ADENOIDECTOMY WITH MYRINGOTOMY    . TONSILLECTOMY      There were no vitals filed for this visit.  Subjective Assessment - 11/27/18 1310    Subjective  Pt reports she is doing her execises every day and she asks questions indicating she is doing them very diligently. She refers to her handouts and says that she can do them now without looking at them . 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    Currently in Pain?  Yes    Pain Score  2     Pain Location  Shoulder    Pain Orientation  Right    Pain Descriptors / Indicators  --   pulling  [facility] Adult PT Treatment/Exercise - 11/27/18 0001      Self-Care   Other Self-Care Comments   gave pt information about kimbe swell spot ffrom compression guru showed pictures of [place] and gave information about second to nature and sent a script to Dr. Marlou Starks       Lumbar Exercises: Supine   Other Supine Lumbar Exercises  lower trunk rotation with arms in goal post position       Shoulder Exercises: Supine   Other Supine Exercises  purple ball at thoraic spine and folded pillow under head, pt did scapular retration, protration, shoulder flexion     Other Supine Exercises  dead bug exercise       Shoulder Exercises: Sidelying   Other Sidelying Exercises  from sidlying reaching arm forward and badk over into  horizontal abudtion for thoracic stretch on both sides       Manual Therapy   Myofascial Release  standing at wall with tennis ball at side for self myofascila release to right lateral chest. Also received manual myofascial release to right lateral chest and breast              PT Education - 11/27/18 1628    Education Details  how to do self myofascial release, ideas about compression bras and also swell spots    Person(s) Educated  Patient    Methods  Explanation;Demonstration;Handout    Comprehension  Verbalized understanding          PT Long Term Goals - 11/20/18 1507      PT LONG TERM GOAL #1   Title  Pt will report that freqency of pain incidents have decreased by 50% so that she can perform her houshold activities easier    Baseline  11/20/2018 Pt says she has improved    Status  Achieved      PT LONG TERM GOAL #2   Title  Pt will report the level of her pain, when she does have it , has decreased to 5/10    Baseline  10/10 on eval, On 11/20/2018, she had pain but it was much less    Status  Achieved      PT LONG TERM GOAL #3   Title  Pt will be independent in a home exercise program.    Status  Achieved      PT LONG TERM GOAL #4   Title  Pt will decrease Quick DASH score to < 20 indicating and improvment in UE function    Baseline  52.27    Time  4    Period  Weeks    Status  On-going            Plan - 11/27/18 1629    Clinical Impression Statement  Pt has been doing exercised faithfully and feels that she is making improvment but she stil has tightness in her lateral chest and breast.  She will likely need more of a compression bra and swell spot so recommendations for this were made today    Comorbidities  radiation, arthitis, tendonitis    PT Frequency  2x / week    PT Duration  4 weeks    PT Next Visit Plan  check goals, work on myofascial and MLD to lateral breast and chest, discharge    Consulted and Agree with Plan of Care  Patient        Patient will benefit from skilled therapeutic intervention in order to improve the following deficits and  impairments:  Pain, Increased fascial restricitons, Postural dysfunction, Decreased range of motion, Decreased strength, Impaired UE functional use, Increased edema  Visit Diagnosis: Disorder of the skin and subcutaneous tissue related to radiation, unspecified  Cramp and spasm  Abnormal posture  Pain in breast  Muscle weakness (generalized)     Problem List Patient Active Problem List   Diagnosis Date Noted  . Pure hypercholesterolemia 10/10/2018  . Genetic testing 12/10/2014  . Breast cancer of 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 breast ([place]) 11/13/2014  . Allergic rhinoconjunctivitis 10/22/2014  . Asthma 10/22/2014   Donato Heinz. Owens Shark PT  Norwood Levo 11/27/2018, 4:33 PM  [place], [place], [address] Phone: [phone]   Fax:  [phone]  Name: Valerie Snyder MRN: [id number] Date of Birth: [birth year]

## 2018-11-29 ENCOUNTER — Ambulatory Visit: Payer: Medicare Other | Admitting: Physical Therapy

## 2018-11-29 DIAGNOSIS — Z20828 Contact with and (suspected) exposure to other viral communicable diseases: Secondary | ICD-10-CM | POA: Diagnosis not present

## 2018-12-05 DIAGNOSIS — H259 Unspecified age-related cataract: Secondary | ICD-10-CM | POA: Diagnosis not present

## 2018-12-05 DIAGNOSIS — Z135 Encounter for screening for eye and ear disorders: Secondary | ICD-10-CM | POA: Diagnosis not present

## 2018-12-11 DIAGNOSIS — E7800 Pure hypercholesterolemia, unspecified: Secondary | ICD-10-CM | POA: Diagnosis not present

## 2018-12-11 DIAGNOSIS — Z5181 Encounter for therapeutic drug level monitoring: Secondary | ICD-10-CM | POA: Diagnosis not present

## 2018-12-11 LAB — COMPREHENSIVE METABOLIC PANEL
ALT: 24 IU/L (ref 0–32)
AST: 23 IU/L (ref 0–40)
Albumin/Globulin Ratio: 1.6 (ref 1.2–2.2)
Albumin: 3.9 g/dL (ref 3.8–4.8)
Alkaline Phosphatase: 50 IU/L (ref 39–117)
BUN/Creatinine Ratio: 15 (ref 12–28)
BUN: 11 mg/dL (ref 8–27)
Bilirubin Total: 0.4 mg/dL (ref 0.0–1.2)
CO2: 24 mmol/L (ref 20–29)
Calcium: 9.3 mg/dL (ref 8.7–10.3)
Chloride: 105 mmol/L (ref 96–106)
Creatinine, Ser: 0.72 mg/dL (ref 0.57–1.00)
GFR calc Af Amer: 98 mL/min/{1.73_m2} (ref >59)
GFR calc non Af Amer: 85 mL/min/{1.73_m2} (ref >59)
Globulin, Total: 2.5 g/dL (ref 1.5–4.5)
Glucose: 99 mg/dL (ref 65–99)
Potassium: 4.5 mmol/L (ref 3.5–5.2)
Sodium: 142 mmol/L (ref 134–144)
Total Protein: 6.4 g/dL (ref 6.0–8.5)

## 2018-12-11 LAB — LIPID PANEL
Chol/HDL Ratio: 2.9 ratio (ref 0.0–4.4)
Cholesterol, Total: 192 mg/dL (ref 100–199)
HDL: 66 mg/dL (ref >39)
LDL Chol Calc (NIH): 108 mg/dL — ABNORMAL HIGH (ref 0–99)
Triglycerides: 102 mg/dL (ref 0–149)
VLDL Cholesterol Cal: 18 mg/dL (ref 5–40)

## 2018-12-12 ENCOUNTER — Ambulatory Visit: Payer: Medicare Other | Attending: General Surgery | Admitting: Physical Therapy

## 2018-12-12 ENCOUNTER — Other Ambulatory Visit: Payer: Self-pay

## 2018-12-12 DIAGNOSIS — R252 Cramp and spasm: Secondary | ICD-10-CM | POA: Insufficient documentation

## 2018-12-12 DIAGNOSIS — R293 Abnormal posture: Secondary | ICD-10-CM | POA: Diagnosis not present

## 2018-12-12 DIAGNOSIS — N644 Mastodynia: Secondary | ICD-10-CM | POA: Insufficient documentation

## 2018-12-12 DIAGNOSIS — L599 Disorder of the skin and subcutaneous tissue related to radiation, unspecified: Secondary | ICD-10-CM | POA: Insufficient documentation

## 2018-12-12 DIAGNOSIS — M6281 Muscle weakness (generalized): Secondary | ICD-10-CM | POA: Diagnosis not present

## 2018-12-12 NOTE — Therapy (Signed)
[place], [place], [address] Phone: [phone]   Fax:  [phone]  Physical Therapy Treatment  Patient Details  Name: Valerie Snyder MRN: [id number] Date of Birth: [birth year] Referring Provider (PT): Dr. Marlou Starks   Encounter Date: 12/12/2018  PT End of Session - 12/12/18 1211    Visit Number  9    Number of Visits  9    Date for PT Re-Evaluation  12/02/18    PT Start Time  1100    PT Stop Time  1145    PT Time Calculation (min)  45 min    Activity Tolerance  Patient tolerated treatment well    Behavior During Therapy  [facility] for tasks assessed/performed       Past Medical History:  Diagnosis Date  . Allergy   . Asthma   . Breast cancer of 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 breast ([place]) 11/13/2014  . Complication of anesthesia    10 years ago- pt states she stopped breathing and "had to be bagged"  . Concussion 2018  . GERD (gastroesophageal reflux disease)   . H/O bilateral inguinal hernia repair 1992  . Hypercholesteremia   . Hypothyroidism   . Migraines   . Personal history of radiation therapy   . Plantar fasciitis, bilateral   . Pure hypercholesterolemia 10/10/2018  . Radiation 01/12/15-02/04/15   right breast 42.17 gray  . Spinal stenosis   . Thyroid disease     Past Surgical History:  Procedure Laterality Date  . BLADDER SUSPENSION    . BLEPHAROPLASTY Bilateral   . BREAST LUMPECTOMY Right 12/10/2014  . BREAST LUMPECTOMY WITH RADIOACTIVE SEED LOCALIZATION Right 12/10/2014   Procedure: RIGHT BREAST LUMPECTOMY WITH RADIOACTIVE SEED LOCALIZATION;  Surgeon: Autumn Messing III, MD;  Location: [place];  Service: General;  Laterality: Right;  . FOOT SURGERY     plantar fashiectomies from both feet  . HERNIA REPAIR    . HYSTEROSCOPY W/D&C N/A 12/27/2017   Procedure: DILATATION AND CURETTAGE /HYSTEROSCOPY;  Surgeon: Janyth Pupa, DO;  Location: [place];   Service: Gynecology;  Laterality: N/A;  . NASAL SEPTUM SURGERY    . neuromas removed from right foot    . 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 POLYPECTOMY  12/27/2017   Procedure: POLYPECTOMY;  Surgeon: Janyth Pupa, DO;  Location: [place];  Service: Gynecology;;  . removal of basal cell carcinoma (nose)    . TONSILECTOMY/ADENOIDECTOMY WITH MYRINGOTOMY    . TONSILLECTOMY      There were no vitals filed for this visit.  Subjective Assessment - 12/12/18 1150    Subjective  Pt continues to do her exercises daily.  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    Currently in Pain?  No/denies         [facility] PT Assessment - 12/12/18 0001      Observation/Other Assessments   Other Surveys   Katina Dung    Quick DASH   9.09           Katina Dung - 12/12/18 0001    Open  a tight or new jar  No difficulty    Do heavy household chores (wash walls, wash floors)  Mild difficulty    Carry a shopping bag or briefcase  No difficulty    Wash your back  No difficulty    Use a knife to cut food  Mild difficulty    Recreational activities in which you take some force or impact through your arm, shoulder, or hand (golf, hammering, tennis)  Mild difficulty    During the past week, to what extent has your arm, shoulder or hand problem interfered with your normal social activities with family, friends, neighbors, or groups?  Not at all    Arm, shoulder, or hand pain.  Mild    Tingling (pins and needles) in your arm, shoulder, or hand  Mild    Difficulty Sleeping  No difficulty    DASH Score  9.09 %             [facility] Adult PT Treatment/Exercise - 12/12/18  0001      Self-Care   Self-Care  Other Self-Care Comments    Other Self-Care Comments   gave pt script for compression bra and upgraded peach dotted foam to include white foam backing       Lumbar Exercises: Supine   Other Supine Lumbar Exercises  right arm across chest and upper thoracic posteriot rotation       Lumbar Exercises: Quadruped   Madcat/Old Horse  5 reps    Madcat/Old Horse Limitations  cues to get better thoracic extension       Manual Therapy   Soft tissue mobilization  in L sidelying to R periscapular area, upper traps and serratus anterior, increased tightness palpated especially at serratus anterior that released by end of session    Myofascial Release   in sidelying with crossed hands and lateral chest                   PT Long Term Goals - 12/12/18 1144      PT LONG TERM GOAL #1   Title  Pt will report that freqency of pain incidents have decreased by 50% so that she can perform her houshold activities easier    Baseline  11/20/2018 Pt says she has improved. 12/12/2018 improved by 70%    Status  Achieved      PT LONG TERM GOAL #2   Title  Pt will report the level of her pain, when she does have it , has decreased to 5/10    Baseline  10/10 on eval, On 11/20/2018, she had pain but it was much less, 12/12/2018 pt has decreased to 2 or 3 and she knows how to take care of it    Status  Achieved      PT LONG TERM GOAL #3   Title  Pt will be independent in a home exercise program.    Status  Achieved      PT LONG TERM GOAL #4   Title  Pt will decrease Quick DASH score to < 20 indicating and improvment in UE function    Baseline  52.27 on eval . On  12/12/2018 9.09    Status  Achieved            Plan - 12/12/18 1211    Clinical Impression Statement  Pt continues to do well with exercise and is continuing to see functional improvement.  She stil has fullness in lateral chest so will get the compression bra.  Script issued today and she knows where  to go get it. She feels like she is ready to discharge from this episode    Personal Factors and Comorbidities  Comorbidity 3+    Comorbidities  radiation, arthitis, tendonitis    PT Treatment/Interventions  ADLs/Self Care Home Management;Manual lymph drainage;Passive range of motion;Manual techniques;Therapeutic exercise;Therapeutic activities;Patient/family education;Joint Manipulations    PT Next Visit Plan  discharge       Patient will benefit from skilled therapeutic intervention in order to improve the following deficits and impairments:  Pain, Increased fascial restricitons, Postural dysfunction, Decreased range of motion, Decreased strength, Impaired UE functional use, Increased edema  Visit Diagnosis: Disorder of the skin and subcutaneous tissue related to radiation, unspecified  Cramp and spasm  Abnormal posture  Pain in breast  Muscle weakness (generalized)     Problem List Patient Active Problem List   Diagnosis Date Noted  . Pure hypercholesterolemia 10/10/2018  . Genetic testing 12/10/2014  . Breast cancer of 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 breast ([place]) 11/13/2014  . Allergic rhinoconjunctivitis 10/22/2014  . Asthma 10/22/2014   PHYSICAL THERAPY DISCHARGE SUMMARY  Visits from Start of Care: 9  Current functional level related to goals / functional outcomes: indpendent    Remaining deficits: As above    Education / Equipment: Home exercise, use of compression   Plan: Patient agrees to discharge.  Patient goals were met. Patient is being discharged due to being pleased with the current functional level.  ?????   Donato Heinz. Owens Shark PT   [place] Levo 12/12/2018, 12:14 PM  [place], [place], [address] Phone: [phone]   Fax:  [phone]  Name: Valerie Snyder MRN: [id number] Date of Birth: 1951/01/27

## 2018-12-17 DIAGNOSIS — H5213 Myopia, bilateral: Secondary | ICD-10-CM | POA: Diagnosis not present

## 2018-12-17 DIAGNOSIS — H04123 Dry eye syndrome of bilateral lacrimal glands: Secondary | ICD-10-CM | POA: Diagnosis not present

## 2018-12-17 DIAGNOSIS — H2513 Age-related nuclear cataract, bilateral: Secondary | ICD-10-CM | POA: Diagnosis not present

## 2018-12-17 DIAGNOSIS — H25013 Cortical age-related cataract, bilateral: Secondary | ICD-10-CM | POA: Diagnosis not present

## 2018-12-31 ENCOUNTER — Other Ambulatory Visit: Payer: Self-pay

## 2018-12-31 DIAGNOSIS — E7800 Pure hypercholesterolemia, unspecified: Secondary | ICD-10-CM

## 2018-12-31 MED ORDER — EZETIMIBE 10 MG PO TABS: 10.0000 mg | ORAL_TABLET | Freq: Every day | ORAL | 3 refills | Status: DC

## 2018-12-31 NOTE — Telephone Encounter (Signed)
Patient is calling to see if she needs to continue Zetia? If so she needs a refill. Patient called the [address] refill line.

## 2019-01-01 DIAGNOSIS — M722 Plantar fascial fibromatosis: Secondary | ICD-10-CM | POA: Diagnosis not present

## 2019-01-01 DIAGNOSIS — R2689 Other abnormalities of gait and mobility: Secondary | ICD-10-CM | POA: Diagnosis not present

## 2019-01-01 DIAGNOSIS — M21622 Bunionette of left foot: Secondary | ICD-10-CM | POA: Diagnosis not present

## 2019-01-01 DIAGNOSIS — M7741 Metatarsalgia, right foot: Secondary | ICD-10-CM | POA: Diagnosis not present

## 2019-01-01 DIAGNOSIS — M7742 Metatarsalgia, left foot: Secondary | ICD-10-CM | POA: Diagnosis not present

## 2019-01-15 ENCOUNTER — Ambulatory Visit (INDEPENDENT_AMBULATORY_CARE_PROVIDER_SITE_OTHER): Payer: Medicare Other | Admitting: Cardiovascular Disease

## 2019-01-15 ENCOUNTER — Encounter: Payer: Self-pay | Admitting: Cardiovascular Disease

## 2019-01-15 ENCOUNTER — Other Ambulatory Visit: Payer: Self-pay

## 2019-01-15 VITALS — BP 132/74 | HR 78 | Temp 97.0°F | Ht 65.0 in | Wt 147.0 lb

## 2019-01-15 DIAGNOSIS — E7800 Pure hypercholesterolemia, unspecified: Secondary | ICD-10-CM | POA: Diagnosis not present

## 2019-01-15 DIAGNOSIS — Z5181 Encounter for therapeutic drug level monitoring: Secondary | ICD-10-CM

## 2019-01-15 DIAGNOSIS — I2510 Atherosclerotic heart disease of native coronary artery without angina pectoris: Secondary | ICD-10-CM | POA: Diagnosis not present

## 2019-01-15 HISTORY — DX: Atherosclerotic heart disease of native coronary artery without angina pectoris: I25.10

## 2019-01-15 MED ORDER — PRAVASTATIN SODIUM 10 MG PO TABS: 10.0000 mg | ORAL_TABLET | Freq: Every day | ORAL | 1 refills | Status: DC

## 2019-01-15 NOTE — Patient Instructions (Signed)
Medication Instructions:  START PRAVASTATIN 10 MG DAILY   *If you need a refill on your cardiac medications before your next appointment, please call your pharmacy*  Lab Work: FASTING LP/CMET IN 3 MONTHS  If you have labs (blood work) drawn today and your tests are completely normal, you will receive your results only by: Marland Kitchen MyChart Message (if you have MyChart) OR . A paper copy in the mail If you have any lab test that is abnormal or we need to change your treatment, we will call you to review the results.  Testing/Procedures: NONE   Follow-Up: At [facility], you and your health needs are our priority.  As part of our continuing mission to provide you with exceptional heart care, we have created designated Provider Care Teams.  These Care Teams include your primary Cardiologist (physician) and Advanced Practice Providers (APPs -  Physician Assistants and Nurse Practitioners) who all work together to provide you with the care you need, when you need it.  Your next appointment:   12 month(s)  The format for your next appointment:   In Person  Provider:   You may see DR [facility]  or one of the following Advanced Practice Providers on your designated Care Team:    Kerin Ransom, PA-C  [place], [place]  Coletta Memos, [place]

## 2019-01-15 NOTE — Progress Notes (Signed)
Cardiology Office Note   Date:  01/15/2019   ID:  Valerie Snyder, Valerie Snyder November 28, 1950, MRN [id number]  PCP:  Leighton Ruff, MD  Cardiologist:   Skeet Latch, MD  Rheum: Marella Chimes, PA  No chief complaint on file.   History of Present Illness: Valerie Snyder is a 68 y.o. female with asymptomatic coronary calcification and hyperlipidemia here for follow up.  In April 2020 she developed tachycardia while dancing.   She dances daily for at least 20 minutes.  One day she started feeling like heart was racing while dancing.  The episode lasted for around an hour.  She called her PCP who recommended that she be seen in the ED.  She had no chest pressure, shortness of breath, lightheadedness or dizziness.  By the time she got to the ED her symptoms had subsided.  Telemetry was unremarkable.  Since then she denies any recurrent episodes.  She walks daily and bikes several days per week.  She has no exertional symptoms.  She denies lower extremity edema, orthopnea or PND.  She has a longstanding history of hyperlipidemia despite having an excellent diet and exercising regularly.  She has a strong family history of CAD and atrial fibrillation.    Valerie Snyder had a coronary calcium score 11/2018 that was 72nd perecentile for age/gender.  She started on Zetia and has tolerated it well.  In the past she tried atorvastatin and rosuvastatin but developed myalgias.  She thinks she is tried other statins as well but cannot remember them.   Past Medical History:  Diagnosis Date  . Allergy   . Asthma   . Breast cancer of 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 breast ([place]) 11/13/2014  . CAD in native artery 01/15/2019   Asymptomatic coronary calcification on coronary calcium score.  72nd percentile 11/2018.  Marland Kitchen Complication of anesthesia    10 years ago- pt states she stopped breathing and "had to be bagged"  . Concussion 2018  . GERD (gastroesophageal reflux disease)   . H/O bilateral  inguinal hernia repair 1992  . Hypercholesteremia   . Hypothyroidism   . Migraines   . Personal history of radiation therapy   . Plantar fasciitis, bilateral   . Pure hypercholesterolemia 10/10/2018  . Radiation 01/12/15-02/04/15   right breast 42.17 gray  . Spinal stenosis   . Thyroid disease     Past Surgical History:  Procedure Laterality Date  . BLADDER SUSPENSION    . BLEPHAROPLASTY Bilateral   . BREAST LUMPECTOMY Right 12/10/2014  . BREAST LUMPECTOMY WITH RADIOACTIVE SEED LOCALIZATION Right 12/10/2014   Procedure: RIGHT BREAST LUMPECTOMY WITH RADIOACTIVE SEED LOCALIZATION;  Surgeon: Autumn Messing III, MD;  Location: [facility];  Service: General;  Laterality: Right;  . FOOT SURGERY     plantar fashiectomies from both feet  . HERNIA REPAIR    . HYSTEROSCOPY W/D&C N/A 12/27/2017   Procedure: DILATATION AND CURETTAGE /HYSTEROSCOPY;  Surgeon: Janyth Pupa, DO;  Location: [facility];  Service: Gynecology;  Laterality: N/A;  . NASAL SEPTUM SURGERY    . neuromas removed from right foot    . 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 POLYPECTOMY  12/27/2017   Procedure: POLYPECTOMY;  Surgeon: Janyth Pupa, DO;  Location: [facility];  Service: Gynecology;;  . removal of basal cell carcinoma (nose)    . TONSILECTOMY/ADENOIDECTOMY WITH MYRINGOTOMY    . TONSILLECTOMY       Current Outpatient Medications  Medication Sig  Dispense Refill  . celecoxib (CELEBREX) 200 MG capsule Take 200 mg by mouth daily.    . cetirizine (ZYRTEC) 10 MG tablet Take 10 mg by mouth daily as needed for allergies.     . Cranberry 450 MG CAPS Take 900 mg by mouth daily.     Marland Kitchen EPINEPHrine (EPIPEN 2-PAK) 0.3 mg/0.3 mL IJ SOAJ injection Inject 0.3 mLs (0.3 mg total) into the muscle once as needed. Reported on 01/20/2015 1 Device 1  . ezetimibe (ZETIA) 10 MG tablet Take 1 tablet (10 mg total) by mouth daily. 90 tablet 3  . levothyroxine (SYNTHROID, LEVOTHROID) 100 MCG  tablet Take 100 mcg by mouth daily before breakfast.    . Misc Natural Products (OSTEO BI-FLEX ADV TRIPLE ST PO) Take 2 tablets by mouth daily.    . Multiple Vitamins-Minerals (CENTRUM SILVER ADULT 50+ PO) Take 1 tablet by mouth daily.     . Omega-3 Fatty Acids (OMEGA-3 FISH OIL) 1200 MG CAPS Take 2 capsules (2,400 mg total) by mouth daily. (Patient taking differently: Take 2,000 mg by mouth daily. )    . pantoprazole (PROTONIX) 20 MG tablet Take 1 tablet (20 mg total) by mouth daily.    . Probiotic Product (SOLUBLE FIBER/PROBIOTICS PO) Take 1 capsule by mouth daily. 20 billion    . SUMAtriptan (IMITREX) 25 MG tablet Take 25 mg by mouth every 2 (two) hours as needed for migraine.     . tamoxifen (NOLVADEX) 10 MG tablet Take 1 tablet (10 mg total) by mouth daily. 90 tablet 2  . pravastatin (PRAVACHOL) 10 MG tablet Take 1 tablet (10 mg total) by mouth daily. 90 tablet 1   No current facility-administered medications for this visit.    Allergies:   Bee venom, Keflex [cephalexin], and Toradol [ketorolac tromethamine]    Social History:  The patient  reports that she has never smoked. She has never used smokeless tobacco. She reports that she does not drink alcohol or use drugs.   Family History:  The patient's family history includes Aneurysm in her maternal grandmother; Breast cancer in her cousin; Colon polyps in her daughter and mother; Congestive Heart Failure (age of onset: 23) in her mother; Dementia in her father; Depression in her father, sister, and sister; Emphysema in her paternal grandfather; Heart Problems in her paternal grandfather; Heart attack in her maternal grandfather; Kidney failure in her father; Lung disease in her mother; Multiple sclerosis in her sister; Other in her sister; Pancreatic cancer in her maternal aunt; Stroke in her father; Testicular cancer in her cousin.    ROS:  Please see the history of present illness.   Otherwise, review of systems are positive for none.    All other systems are reviewed and negative.    PHYSICAL EXAM: VS:  BP 132/74   Pulse 78   Temp (!) 97 F (36.1 C)   Ht 5\' 5"  (1.651 m)   Wt 147 lb (66.7 kg)   SpO2 99%   BMI 24.46 kg/m  , BMI Body mass index is 24.46 kg/m. GENERAL:  Well appearing HEENT: Pupils equal round and reactive, fundi not visualized, oral mucosa unremarkable NECK:  No jugular venous distention, waveform within normal limits, carotid upstroke brisk and symmetric, no bruits LUNGS:  Clear to auscultation bilaterally HEART:  RRR.  PMI not displaced or sustained,S1 and S2 within normal limits, no S3, no S4, no clicks, no rubs, no murmurs ABD:  Flat, positive bowel sounds normal in frequency in pitch, no bruits, no  rebound, no guarding, no midline pulsatile mass, no hepatomegaly, no splenomegaly EXT:  2 plus pulses throughout, no edema, no cyanosis no clubbing SKIN:  No rashes no nodules NEURO:  Cranial nerves II through XII grossly intact, motor grossly intact throughout PSYCH:  Cognitively intact, oriented to person place and time    EKG:  EKG is ordered today. The ekg ordered today demonstrates sinus rhythm.  Rate 87 bpm  Coronary calcium score 11/07/18: IMPRESSION: Coronary calcium score of 95.93. This was 72nd percentile for age and sex matched control.    Recent Labs: 05/31/2018: Hemoglobin 13.2; Platelets 199 12/11/2018: ALT 24; BUN 11; Creatinine, Ser 0.72; Potassium 4.5; Sodium 142    Lipid Panel    Component Value Date/Time   CHOL 192 12/11/2018 1040   TRIG 102 12/11/2018 1040   HDL 66 12/11/2018 1040   CHOLHDL 2.9 12/11/2018 1040   LDLCALC 108 (H) 12/11/2018 1040      Wt Readings from Last 3 Encounters:  01/15/19 147 lb (66.7 kg)  10/10/18 151 lb (68.5 kg)  04/05/18 154 lb 14.4 oz (70.3 kg)      ASSESSMENT AND PLAN:  # Coronary calcification:  # Hyperlipidemia: Coronary calcium score was 92nd percentile.  She continues to exercise regularly and has no exertional symptoms.   She hasn't tolerated statins and has tried several.  She is tolerating Zetia and LDL has improved but above her goal of <70.  Her diet is excellent.  We will add pravastatin 10mg  daily.  Check lipids/CMP in 3 months.  She may need a PCSK9 inhibitor or bempidoic acid.   # Palpitations: No recurrent arrhythmias. She does have a family history of atrial fibrillation.  We will get a monitor if it occurs again.      Current medicines are reviewed at length with the patient today.  The patient does not have concerns regarding medicines.  The following changes have been made:  pravastatin  Labs/ tests ordered today include:   Orders Placed This Encounter  Procedures  . Lipid Profile  . Comprehensive Metabolic Panel (CMET)     Disposition:   FU with Valerie Mullinix C. Oval Linsey, MD, [facility] in 1 year.     Signed, Valerie Harriger C. Oval Linsey, MD, [facility]  01/15/2019 5:07 PM    [place]

## 2019-01-29 ENCOUNTER — Encounter: Payer: Self-pay | Admitting: Allergy and Immunology

## 2019-01-29 ENCOUNTER — Other Ambulatory Visit: Payer: Self-pay

## 2019-01-29 ENCOUNTER — Ambulatory Visit (INDEPENDENT_AMBULATORY_CARE_PROVIDER_SITE_OTHER): Payer: Medicare Other | Admitting: Allergy and Immunology

## 2019-01-29 VITALS — BP 130/80 | HR 81 | Temp 97.5°F | Resp 16

## 2019-01-29 DIAGNOSIS — K219 Gastro-esophageal reflux disease without esophagitis: Secondary | ICD-10-CM | POA: Diagnosis not present

## 2019-01-29 DIAGNOSIS — J4520 Mild intermittent asthma, uncomplicated: Secondary | ICD-10-CM | POA: Diagnosis not present

## 2019-01-29 DIAGNOSIS — Z91030 Bee allergy status: Secondary | ICD-10-CM

## 2019-01-29 DIAGNOSIS — J3089 Other allergic rhinitis: Secondary | ICD-10-CM | POA: Diagnosis not present

## 2019-01-29 DIAGNOSIS — Z91038 Other insect allergy status: Secondary | ICD-10-CM

## 2019-01-29 DIAGNOSIS — I2510 Atherosclerotic heart disease of native coronary artery without angina pectoris: Secondary | ICD-10-CM

## 2019-01-29 MED ORDER — ALBUTEROL SULFATE HFA 108 (90 BASE) MCG/ACT IN AERS: INHALATION_SPRAY | RESPIRATORY_TRACT | 1 refills | Status: DC

## 2019-01-29 NOTE — Patient Instructions (Addendum)
  1. Continue to treat reflux:   A. Protonix 20 mg 1 time per day  2. Continue the following if needed:   A. Zyrtec 10 mg one tablet once a day  B. Mucinex  C. nasal saline / gel  D. Ventolin HFA 2 puffs every 4-6 hours  E. EpiPen  3. Return to clinic in 12 months or earlier if problem  4. Obtain COVID vaccine when available

## 2019-01-29 NOTE — Progress Notes (Signed)
[facility] [place] [facility]   Follow-up Note  Referring Provider: Leighton Ruff, MD Primary Provider: Leighton Ruff, MD Date of Office Visit: 01/29/2019  Subjective:   Valerie Snyder (DOB: [birth year]) is a 68 y.o. female who returns to the [facility] on 01/29/2019 in re-evaluation of the following:  HPI: Jerald returns to this clinic in reevaluation of allergic rhinoconjunctivitis and intermittent asthma and LPR and a history of hymenoptera venom hypersensitivity state.  Her last visit to this clinic was 12 Jun 2018.  Her airway has really done very well since her last visit.  She rarely uses a short acting bronchodilator and can exercise without any problem.  She has had very little issues with her nose at this point.  She has not required either a systemic steroid or an antibiotic to treat any type of airway issue.  Her reflux is under very good control as is her throat issue while consistently using Protonix.  She remains away from hymenoptera venom exposure and continues to carry an EpiPen.  She did receive the flu vaccine this year.  Allergies as of 01/29/2019      Reactions   Bee Venom Anaphylaxis   Keflex [cephalexin] Anaphylaxis   Puffy eyes and lips   Toradol [ketorolac Tromethamine] Anaphylaxis   Puffy lips and eyes      Medication List      albuterol 108 (90 Base) MCG/ACT inhaler Commonly known as: Ventolin HFA Inhale 2 puffs into the lungs every 4-6 hours as needed for cough, wheeze, shortness of breath or cough. Started by: Jiles Prows, MD   celecoxib 200 MG capsule Commonly known as: CELEBREX Take 200 mg by mouth daily.   CENTRUM SILVER ADULT 50+ PO Take 1 tablet by mouth daily.   cetirizine 10 MG tablet Commonly known as: ZYRTEC Take 10 mg by mouth daily as needed for allergies.   CoQ10 100 MG Caps Take by mouth daily.   Cranberry 450 MG Caps Take 900 mg by mouth daily.     EPINEPHrine 0.3 mg/0.3 mL Soaj injection Commonly known as: EpiPen 2-Pak Inject 0.3 mLs (0.3 mg total) into the muscle once as needed. Reported on 01/20/2015   ezetimibe 10 MG tablet Commonly known as: ZETIA Take 1 tablet (10 mg total) by mouth daily.   levothyroxine 100 MCG tablet Commonly known as: SYNTHROID Take 100 mcg by mouth daily before breakfast.   Omega-3 Fish Oil 1200 MG Caps Take 2 capsules (2,400 mg total) by mouth daily. What changed: how much to take   OSTEO BI-FLEX ADV TRIPLE ST PO Take 2 tablets by mouth daily.   pantoprazole 20 MG tablet Commonly known as: Protonix Take 1 tablet (20 mg total) by mouth daily.   pravastatin 10 MG tablet Commonly known as: PRAVACHOL Take 1 tablet (10 mg total) by mouth daily.   SOLUBLE FIBER/PROBIOTICS PO Take 1 capsule by mouth daily. 20 billion   SUMAtriptan 25 MG tablet Commonly known as: IMITREX Take 25 mg by mouth every 2 (two) hours as needed for migraine.   tamoxifen 10 MG tablet Commonly known as: NOLVADEX Take 1 tablet (10 mg total) by mouth daily.       Past Medical History:  Diagnosis Date  . Allergy   . Asthma   . Breast cancer of 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 breast ([place]) 11/13/2014  . CAD in native artery 01/15/2019   Asymptomatic coronary calcification on coronary calcium score.  72nd percentile  11/2018.  Marland Kitchen Complication of anesthesia    10 years ago- pt states she stopped breathing and "had to be bagged"  . Concussion 2018  . GERD (gastroesophageal reflux disease)   . H/O bilateral inguinal hernia repair 1992  . Hypercholesteremia   . Hypothyroidism   . Migraines   . Personal history of radiation therapy   . Plantar fasciitis, bilateral   . Pure hypercholesterolemia 10/10/2018  . Radiation 01/12/15-02/04/15   right breast 42.17 gray  . Spinal stenosis   . Thyroid disease     Past Surgical History:  Procedure Laterality Date  . BLADDER SUSPENSION    . BLEPHAROPLASTY Bilateral   .  BREAST LUMPECTOMY Right 12/10/2014  . BREAST LUMPECTOMY WITH RADIOACTIVE SEED LOCALIZATION Right 12/10/2014   Procedure: RIGHT BREAST LUMPECTOMY WITH RADIOACTIVE SEED LOCALIZATION;  Surgeon: Autumn Messing III, MD;  Location: [facility];  Service: General;  Laterality: Right;  . FOOT SURGERY     plantar fashiectomies from both feet  . HERNIA REPAIR    . HYSTEROSCOPY WITH D & C N/A 12/27/2017   Procedure: DILATATION AND CURETTAGE /HYSTEROSCOPY;  Surgeon: Janyth Pupa, DO;  Location: [facility];  Service: Gynecology;  Laterality: N/A;  . NASAL SEPTUM SURGERY    . neuromas removed from right foot    . 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 POLYPECTOMY  12/27/2017   Procedure: POLYPECTOMY;  Surgeon: Janyth Pupa, DO;  Location: [facility];  Service: Gynecology;;  . removal of basal cell carcinoma (nose)    . TONSILECTOMY/ADENOIDECTOMY WITH MYRINGOTOMY    . TONSILLECTOMY      Review of systems negative except as noted in HPI / PMHx or noted below:  Review of Systems  Constitutional: Negative.   HENT: Negative.   Eyes: Negative.   Respiratory: Negative.   Cardiovascular: Negative.   Gastrointestinal: Negative.   Genitourinary: Negative.   Musculoskeletal: Negative.   Skin: Negative.   Neurological: Negative.   Endo/Heme/Allergies: Negative.   Psychiatric/Behavioral: Negative.      Objective:   Vitals:   01/29/19 1002  BP: 130/80  Pulse: 81  Resp: 16  Temp: (!) 97.5 F (36.4 C)  SpO2: 99%          Physical Exam Constitutional:      Appearance: She is not diaphoretic.  HENT:     Head: Normocephalic.     Right Ear: Tympanic membrane, ear canal and external ear normal.     Left Ear: Tympanic membrane, ear canal and external ear normal.     Nose: Nose normal. No mucosal edema or rhinorrhea.     Mouth/Throat:     Pharynx: Uvula midline. No oropharyngeal exudate.  Eyes:     Conjunctiva/sclera: Conjunctivae normal.    Neck:     Thyroid: No thyromegaly.     Trachea: Trachea normal. No tracheal tenderness or tracheal deviation.  Cardiovascular:     Rate and Rhythm: Normal rate and regular rhythm.     Heart sounds: Normal heart sounds, S1 normal and S2 normal. No murmur.  Pulmonary:     Effort: No respiratory distress.     Breath sounds: Normal breath sounds. No stridor. No wheezing or rales.  Lymphadenopathy:     Head:     Right side of head: No tonsillar adenopathy.     Left side of head: No tonsillar adenopathy.     Cervical: No cervical adenopathy.  Skin:    Findings: No erythema  or rash.     Nails: There is no clubbing.  Neurological:     Mental Status: She is alert.     Diagnostics:    Spirometry was performed and demonstrated an FEV1 of 2.11 at 89 % of predicted.  Assessment and Plan:   1. Asthma, mild intermittent, well-controlled   2. Other allergic rhinitis   3. Hymenoptera allergy   4. LPRD (laryngopharyngeal reflux disease)     1. Continue to treat reflux:   A. Protonix 20 mg 1 time per day  2. Continue the following if needed:   A. Zyrtec 10 mg one tablet once a day  B. Mucinex  C. nasal saline / gel  D. Ventolin HFA 2 puffs every 4-6 hours  E. EpiPen  3. Return to clinic in 12 months or earlier if problem  4. Obtain COVID vaccine when available  Leinani has really done very well on her current therapy and I would like to continue to have her use Protonix on a consistent basis and utilize a collection of other medication should they be required as noted above.  Assuming she does well I will see her back in this clinic in 12 months or earlier if there is a problem.  Allena Katz, MD Allergy / Immunology [place]

## 2019-01-30 ENCOUNTER — Encounter: Payer: Self-pay | Admitting: Allergy and Immunology

## 2019-01-31 ENCOUNTER — Other Ambulatory Visit: Payer: Self-pay

## 2019-01-31 MED ORDER — ALBUTEROL SULFATE HFA 108 (90 BASE) MCG/ACT IN AERS: 2.0000 | INHALATION_SPRAY | Freq: Four times a day (QID) | RESPIRATORY_TRACT | 1 refills | Status: DC | PRN

## 2019-02-04 DIAGNOSIS — E559 Vitamin D deficiency, unspecified: Secondary | ICD-10-CM | POA: Diagnosis not present

## 2019-02-04 DIAGNOSIS — K219 Gastro-esophageal reflux disease without esophagitis: Secondary | ICD-10-CM | POA: Diagnosis not present

## 2019-02-04 DIAGNOSIS — E7800 Pure hypercholesterolemia, unspecified: Secondary | ICD-10-CM | POA: Diagnosis not present

## 2019-02-04 DIAGNOSIS — E039 Hypothyroidism, unspecified: Secondary | ICD-10-CM | POA: Diagnosis not present

## 2019-02-05 DIAGNOSIS — M79672 Pain in left foot: Secondary | ICD-10-CM | POA: Diagnosis not present

## 2019-02-07 DIAGNOSIS — H25812 Combined forms of age-related cataract, left eye: Secondary | ICD-10-CM | POA: Diagnosis not present

## 2019-02-07 DIAGNOSIS — H25012 Cortical age-related cataract, left eye: Secondary | ICD-10-CM | POA: Diagnosis not present

## 2019-02-07 DIAGNOSIS — H2512 Age-related nuclear cataract, left eye: Secondary | ICD-10-CM | POA: Diagnosis not present

## 2019-02-14 DIAGNOSIS — E559 Vitamin D deficiency, unspecified: Secondary | ICD-10-CM | POA: Diagnosis not present

## 2019-02-14 DIAGNOSIS — E039 Hypothyroidism, unspecified: Secondary | ICD-10-CM | POA: Diagnosis not present

## 2019-02-14 DIAGNOSIS — Z86000 Personal history of in-situ neoplasm of breast: Secondary | ICD-10-CM | POA: Diagnosis not present

## 2019-02-14 DIAGNOSIS — E7800 Pure hypercholesterolemia, unspecified: Secondary | ICD-10-CM | POA: Diagnosis not present

## 2019-02-21 ENCOUNTER — Telehealth: Payer: Self-pay | Admitting: Cardiovascular Disease

## 2019-02-21 DIAGNOSIS — H25811 Combined forms of age-related cataract, right eye: Secondary | ICD-10-CM | POA: Diagnosis not present

## 2019-02-21 DIAGNOSIS — H2511 Age-related nuclear cataract, right eye: Secondary | ICD-10-CM | POA: Diagnosis not present

## 2019-02-21 DIAGNOSIS — H25812 Combined forms of age-related cataract, left eye: Secondary | ICD-10-CM | POA: Diagnosis not present

## 2019-02-21 DIAGNOSIS — H25011 Cortical age-related cataract, right eye: Secondary | ICD-10-CM | POA: Diagnosis not present

## 2019-02-21 NOTE — Telephone Encounter (Signed)
Pt c/o medication issue: 1. Name of Medication: Pravastatin 10 mg 2. How are you currently taking this medication (dosage and times per day)? Started 01/22/19 one time before bed 3. Are you having a reaction (difficulty breathing--STAT)?  No  4. What is your medication issue? Cramping in the legs. Patient would like for some one to call her concering this matter.

## 2019-02-21 NOTE — Telephone Encounter (Signed)
Spoke with patient and she has been taking the Pravastatin for 4 weeks. Last night was the first night she had bad cramping in legs that woke her from her sleep. She has been staying hydrated, eats a banana and orange daily. She will continue since only happened once in 4 weeks. If it happens again she will hold Pravastatin for 2 weeks. If resolves she will resume and call if any issues

## 2019-02-21 NOTE — Telephone Encounter (Signed)
Left message to call back  

## 2019-02-22 NOTE — Telephone Encounter (Signed)
OK thank you 

## 2019-02-28 DIAGNOSIS — M79672 Pain in left foot: Secondary | ICD-10-CM | POA: Diagnosis not present

## 2019-03-01 DIAGNOSIS — E7800 Pure hypercholesterolemia, unspecified: Secondary | ICD-10-CM | POA: Diagnosis not present

## 2019-03-01 DIAGNOSIS — D0511 Intraductal carcinoma in situ of right breast: Secondary | ICD-10-CM | POA: Diagnosis not present

## 2019-03-01 DIAGNOSIS — J45909 Unspecified asthma, uncomplicated: Secondary | ICD-10-CM | POA: Diagnosis not present

## 2019-03-01 DIAGNOSIS — Z853 Personal history of malignant neoplasm of breast: Secondary | ICD-10-CM | POA: Diagnosis not present

## 2019-03-01 DIAGNOSIS — E039 Hypothyroidism, unspecified: Secondary | ICD-10-CM | POA: Diagnosis not present

## 2019-03-04 DIAGNOSIS — M79672 Pain in left foot: Secondary | ICD-10-CM | POA: Diagnosis not present

## 2019-03-05 DIAGNOSIS — D1801 Hemangioma of skin and subcutaneous tissue: Secondary | ICD-10-CM | POA: Diagnosis not present

## 2019-03-05 DIAGNOSIS — I8311 Varicose veins of right lower extremity with inflammation: Secondary | ICD-10-CM | POA: Diagnosis not present

## 2019-03-05 DIAGNOSIS — D225 Melanocytic nevi of trunk: Secondary | ICD-10-CM | POA: Diagnosis not present

## 2019-03-05 DIAGNOSIS — I8312 Varicose veins of left lower extremity with inflammation: Secondary | ICD-10-CM | POA: Diagnosis not present

## 2019-03-05 DIAGNOSIS — R202 Paresthesia of skin: Secondary | ICD-10-CM | POA: Diagnosis not present

## 2019-03-05 DIAGNOSIS — L218 Other seborrheic dermatitis: Secondary | ICD-10-CM | POA: Diagnosis not present

## 2019-03-07 DIAGNOSIS — M79672 Pain in left foot: Secondary | ICD-10-CM | POA: Diagnosis not present

## 2019-03-11 DIAGNOSIS — M79672 Pain in left foot: Secondary | ICD-10-CM | POA: Diagnosis not present

## 2019-03-13 DIAGNOSIS — M79672 Pain in left foot: Secondary | ICD-10-CM | POA: Diagnosis not present

## 2019-03-19 DIAGNOSIS — I872 Venous insufficiency (chronic) (peripheral): Secondary | ICD-10-CM | POA: Diagnosis not present

## 2019-03-19 DIAGNOSIS — I868 Varicose veins of other specified sites: Secondary | ICD-10-CM | POA: Diagnosis not present

## 2019-03-24 DIAGNOSIS — I83813 Varicose veins of bilateral lower extremities with pain: Secondary | ICD-10-CM

## 2019-03-28 ENCOUNTER — Encounter: Payer: Self-pay | Admitting: Vascular Surgery

## 2019-03-28 ENCOUNTER — Ambulatory Visit (HOSPITAL_COMMUNITY)
Admission: RE | Admit: 2019-03-28 | Discharge: 2019-03-28 | Disposition: A | Discharge status: Home / Self Care | Payer: Medicare Other | Source: Ambulatory Visit | Attending: Surgery | Admitting: Surgery

## 2019-03-28 ENCOUNTER — Other Ambulatory Visit: Payer: Self-pay

## 2019-03-28 ENCOUNTER — Ambulatory Visit (INDEPENDENT_AMBULATORY_CARE_PROVIDER_SITE_OTHER): Payer: Medicare Other | Admitting: Vascular Surgery

## 2019-03-28 VITALS — BP 116/64 | HR 87 | Temp 97.9°F | Resp 20 | Ht 65.0 in | Wt 138.0 lb

## 2019-03-28 DIAGNOSIS — I83813 Varicose veins of bilateral lower extremities with pain: Secondary | ICD-10-CM | POA: Diagnosis not present

## 2019-03-28 DIAGNOSIS — I83893 Varicose veins of bilateral lower extremities with other complications: Secondary | ICD-10-CM | POA: Insufficient documentation

## 2019-03-28 NOTE — Progress Notes (Signed)
Referring Physician: Dr Leighton Ruff  Patient name: Valerie Snyder MRN: [id number] DOB: 1950-08-15 Sex: female  REASON FOR CONSULT: Symptomatic varicose veins with swelling and itching  HPI: Valerie Snyder is a 69 y.o. female, referred for evaluation of varicose veins.  Recently she was seen by her dermatologist who noticed she had brawny discoloration of the calf area of both legs.  Her dermatologist suggested that she see a vascular surgeon for varicose veins.  She has previously had sclerotherapy several times in the past and states that the swelling that she had in her legs improved with that.  She has swelling that occurs in her legs at the end of the day.  This resolves overnight and is improved by morning.  She has no prior history of DVT.  She does have a family history of varicose veins in her mother and sisters.  She has never had any skin breakdown.  Other medical problems include history of breast cancer, asthma, elevated cholesterol all of which are currently stable.  Past Medical History:  Diagnosis Date  . Allergy   . Asthma   . Breast cancer of 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 breast ([place]) 11/13/2014  . CAD in native artery 01/15/2019   Asymptomatic coronary calcification on coronary calcium score.  72nd percentile 11/2018.  Marland Kitchen Complication of anesthesia    10 years ago- pt states she stopped breathing and "had to be bagged"  . Concussion 2018  . GERD (gastroesophageal reflux disease)   . H/O bilateral inguinal hernia repair 1992  . Hypercholesteremia   . Hypothyroidism   . Migraines   . Personal history of radiation therapy   . Plantar fasciitis, bilateral   . Pure hypercholesterolemia 10/10/2018  . Radiation 01/12/15-02/04/15   right breast 42.17 gray  . Spinal stenosis   . Thyroid disease    Past Surgical History:  Procedure Laterality Date  . BLADDER SUSPENSION    . BLEPHAROPLASTY Bilateral   . BREAST LUMPECTOMY Right 12/10/2014  . BREAST  LUMPECTOMY WITH RADIOACTIVE SEED LOCALIZATION Right 12/10/2014   Procedure: RIGHT BREAST LUMPECTOMY WITH RADIOACTIVE SEED LOCALIZATION;  Surgeon: Autumn Messing III, MD;  Location: [facility];  Service: General;  Laterality: Right;  . FOOT SURGERY     plantar fashiectomies from both feet  . HERNIA REPAIR    . HYSTEROSCOPY WITH D & C N/A 12/27/2017   Procedure: DILATATION AND CURETTAGE /HYSTEROSCOPY;  Surgeon: Janyth Pupa, DO;  Location: [facility];  Service: Gynecology;  Laterality: N/A;  . NASAL SEPTUM SURGERY    . neuromas removed from right foot    . 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 POLYPECTOMY  12/27/2017   Procedure: POLYPECTOMY;  Surgeon: Janyth Pupa, DO;  Location: [facility];  Service: Gynecology;;  . removal of basal cell carcinoma (nose)    . TONSILECTOMY/ADENOIDECTOMY WITH MYRINGOTOMY    . TONSILLECTOMY      Family History  Problem Relation Age of Onset  . Pancreatic cancer Maternal Aunt        dx. 40s-early 56s  . Congestive Heart Failure Mother 29  . Lung disease Mother   . Colon polyps Mother        unspecified number  . Dementia Father        Lewy Body Dementia  . Kidney failure Father   . Stroke Father   . Depression Father   . Multiple sclerosis Sister   . Depression Sister   .  Aneurysm Maternal Grandmother        brain  . Heart attack Maternal Grandfather   . Emphysema Paternal Grandfather   . Heart Problems Paternal Grandfather   . Other Sister        3 sisters have history of cysts in uterus  . Depression Sister   . Breast cancer Cousin        dx. 45-50  . Testicular cancer Cousin        dx. 13s  . Colon polyps Daughter        unspecified number    SOCIAL HISTORY: Social History   Socioeconomic History  . Marital status: Married    Spouse name: Not on file  . Number of children: 2  . Years of education: Not on file  . Highest education level: Not on file  Occupational History  .  Occupation: retired Air cabin crew  Tobacco Use  . Smoking status: Never Smoker  . Smokeless tobacco: Never Used  Substance and Sexual Activity  . Alcohol use: No  . Drug use: No  . Sexual activity: Not on file  Other Topics Concern  . Not on file  Social History Narrative  . Not on file   Social Determinants of Health   Financial Resource Strain:   . Difficulty of Paying Living Expenses: Not on file  Food Insecurity:   . Worried About Charity fundraiser in the Last Year: Not on file  . Ran Out of Food in the Last Year: Not on file  Transportation Needs:   . Lack of Transportation (Medical): Not on file  . Lack of Transportation (Non-Medical): Not on file  Physical Activity:   . Days of Exercise per Week: Not on file  . Minutes of Exercise per Session: Not on file  Stress:   . Feeling of Stress : Not on file  Social Connections:   . Frequency of Communication with Friends and Family: Not on file  . Frequency of Social Gatherings with Friends and Family: Not on file  . Attends Religious Services: Not on file  . Active Member of Clubs or Organizations: Not on file  . Attends Archivist Meetings: Not on file  . Marital Status: Not on file  Intimate Partner Violence:   . Fear of Current or Ex-Partner: Not on file  . Emotionally Abused: Not on file  . Physically Abused: Not on file  . Sexually Abused: Not on file    Allergies  Allergen Reactions  . Bee Venom Anaphylaxis  . Keflex [Cephalexin] Anaphylaxis    Puffy eyes and lips  . Toradol [Ketorolac Tromethamine] Anaphylaxis    Puffy lips and eyes    Current Outpatient Medications  Medication Sig Dispense Refill  . albuterol (PROVENTIL HFA) 108 (90 Base) MCG/ACT inhaler Inhale 2 puffs into the lungs every 6 (six) hours as needed for wheezing or shortness of breath. 18 g 1  . albuterol (VENTOLIN HFA) 108 (90 Base) MCG/ACT inhaler Inhale 2 puffs into the lungs every 4-6 hours as needed  for cough, wheeze, shortness of breath or cough. 18 g 1  . celecoxib (CELEBREX) 200 MG capsule Take 200 mg by mouth daily.    . cetirizine (ZYRTEC) 10 MG tablet Take 10 mg by mouth daily as needed for allergies.     . Coenzyme Q10 (COQ10) 100 MG CAPS Take by mouth daily.    . Cranberry 450 MG CAPS Take 900 mg by mouth daily.     Marland Kitchen  EPINEPHrine (EPIPEN 2-PAK) 0.3 mg/0.3 mL IJ SOAJ injection Inject 0.3 mLs (0.3 mg total) into the muscle once as needed. Reported on 01/20/2015 1 Device 1  . ezetimibe (ZETIA) 10 MG tablet Take 1 tablet (10 mg total) by mouth daily. 90 tablet 3  . levothyroxine (SYNTHROID, LEVOTHROID) 100 MCG tablet Take 100 mcg by mouth daily before breakfast.    . Misc Natural Products (OSTEO BI-FLEX ADV TRIPLE ST PO) Take 2 tablets by mouth daily.    . Multiple Vitamins-Minerals (CENTRUM SILVER ADULT 50+ PO) Take 1 tablet by mouth daily.     . Omega-3 Fatty Acids (OMEGA-3 FISH OIL) 1200 MG CAPS Take 2 capsules (2,400 mg total) by mouth daily. (Patient taking differently: Take 2,000 mg by mouth daily. )    . pantoprazole (PROTONIX) 20 MG tablet Take 1 tablet (20 mg total) by mouth daily.    . pravastatin (PRAVACHOL) 10 MG tablet Take 1 tablet (10 mg total) by mouth daily. 90 tablet 1  . Probiotic Product (SOLUBLE FIBER/PROBIOTICS PO) Take 1 capsule by mouth daily. 20 billion    . SUMAtriptan (IMITREX) 25 MG tablet Take 25 mg by mouth every 2 (two) hours as needed for migraine.     . tamoxifen (NOLVADEX) 10 MG tablet Take 1 tablet (10 mg total) by mouth daily. 90 tablet 2   No current facility-administered medications for this visit.    ROS:   General:  No weight loss, Fever, chills  HEENT: No recent headaches, no nasal bleeding, no visual changes, no sore throat  Neurologic: No dizziness, blackouts, seizures. No recent symptoms of stroke or mini- stroke. No recent episodes of slurred speech, or temporary blindness.  Cardiac: No recent episodes of chest pain/pressure, no  shortness of breath at rest.  No shortness of breath with exertion.  Denies history of atrial fibrillation or irregular heartbeat  Vascular: No history of rest pain in feet.  No history of claudication.  No history of non-healing ulcer, No history of DVT   Pulmonary: No home oxygen, no productive cough, no hemoptysis,  No asthma or wheezing  Musculoskeletal:  [ ]  Arthritis, [ ]  Low back pain,  [ ]  Joint pain  Hematologic:No history of hypercoagulable state.  No history of easy bleeding.  No history of anemia  Gastrointestinal: No hematochezia or melena,  No gastroesophageal reflux, no trouble swallowing  Urinary: [ ]  chronic Kidney disease, [ ]  on HD - [ ]  MWF or [ ]  TTHS, [ ]  Burning with urination, [ ]  Frequent urination, [ ]  Difficulty urinating;   Skin: No rashes  Psychological: No history of anxiety,  No history of depression   Physical Examination  Vitals:   03/28/19 1336  BP: 116/64  Pulse: 87  Resp: 20  Temp: 97.9 F (36.6 C)  SpO2: 98%  Weight: 138 lb (62.6 kg)  Height: 5\' 5"  (1.651 m)    Body mass index is 22.96 kg/m.  General:  Alert and oriented, no acute distress HEENT: Normal Neck: No JVD Cardiac: Regular Rate and Rhythm Skin: No rash, cluster of varicosities right posterior knee encompassing a surface area of about 10 cm veins are about 3 mm diameter.  She has scattered spider type varicosities diffusely on the anterior thigh and calf Extremity Pulses:  2+ radial, brachial, femoral, dorsalis pedis, posterior tibial pulses bilaterally Musculoskeletal: No deformity or edema  Neurologic: Upper and lower extremity motor 5/5 and symmetric  DATA:  Patient had a venous reflux exam today.  There was no  reflux at the left saphenofemoral junction.  She did have some reflux in the calf portion of the greater saphenous vein in the left leg.  Vein diameter on the left side was 3 to 4 mm.  She also had some reflux in the greater saphenous in the right calf area vein was  only 3 mm in diameter.  I repeated portions of her exam with the SonoSite at the bedside.  The left greater saphenous vein is between 3-1/2 to 4 mm in diameter.  The right greater saphenous was quite small less than 3 mm.  ASSESSMENT: Patient with bilateral lower extremity varicose veins with mild reflux although the saphenofemoral junction is intact in both legs.  She has had a small amount of dilation of the left greater saphenous vein but I do not believe that this is large enough to consider laser ablation.  She does have spider veins and she wishes to consider having these treated as well.  I reassured her regarding the brawny discoloration of her legs and the compression stockings should help reduce worsening of the skin discoloration.  I also discussed with her leg elevation compression stockings occasional Tylenol for pain if necessary.   PLAN: Patient was given a prescription today for bilateral lower extremity compression stockings knee-high 20 to 30 mmHg for symptomatic relief of the itching in her skin and swelling.  She will return at some point in the future if the varicose veins continue to dilate up to consider repeat reflux exam and possibility of laser ablation at some point in future if the vein continues to dilate.  Additionally she was given the phone number for our scleral nurse to consider sclerotherapy of her spider veins  She will follow up on an as-needed basis.   Ruta Hinds, MD [facility] Office: [phone] Pager: [phone]

## 2019-04-03 DIAGNOSIS — M2550 Pain in unspecified joint: Secondary | ICD-10-CM | POA: Diagnosis not present

## 2019-04-03 DIAGNOSIS — M154 Erosive (osteo)arthritis: Secondary | ICD-10-CM | POA: Diagnosis not present

## 2019-04-03 DIAGNOSIS — M19042 Primary osteoarthritis, left hand: Secondary | ICD-10-CM | POA: Diagnosis not present

## 2019-04-03 DIAGNOSIS — E663 Overweight: Secondary | ICD-10-CM | POA: Diagnosis not present

## 2019-04-03 DIAGNOSIS — M722 Plantar fascial fibromatosis: Secondary | ICD-10-CM | POA: Diagnosis not present

## 2019-04-03 DIAGNOSIS — Z6825 Body mass index (BMI) 25.0-25.9, adult: Secondary | ICD-10-CM | POA: Diagnosis not present

## 2019-04-03 DIAGNOSIS — M7711 Lateral epicondylitis, right elbow: Secondary | ICD-10-CM | POA: Diagnosis not present

## 2019-04-03 DIAGNOSIS — M19041 Primary osteoarthritis, right hand: Secondary | ICD-10-CM | POA: Diagnosis not present
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
## 2019-04-11 ENCOUNTER — Inpatient Hospital Stay: Payer: Medicare Other | Attending: Hematology and Oncology | Admitting: Hematology and Oncology

## 2019-04-11 ENCOUNTER — Other Ambulatory Visit: Payer: Self-pay

## 2019-04-11 DIAGNOSIS — Z923 Personal history of irradiation: Secondary | ICD-10-CM | POA: Diagnosis not present

## 2019-04-11 DIAGNOSIS — Z79810 Long term (current) use of selective estrogen receptor modulators (SERMs): Secondary | ICD-10-CM | POA: Diagnosis not present

## 2019-04-11 DIAGNOSIS — C50211 Malignant neoplasm of 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 breast: Secondary | ICD-10-CM | POA: Diagnosis not present

## 2019-04-11 DIAGNOSIS — Z170 Estrogen receptor positive status [ER+]: Secondary | ICD-10-CM | POA: Diagnosis not present

## 2019-04-11 DIAGNOSIS — F4310 Post-traumatic stress disorder, unspecified: Secondary | ICD-10-CM | POA: Insufficient documentation

## 2019-04-11 DIAGNOSIS — Z79899 Other long term (current) drug therapy: Secondary | ICD-10-CM | POA: Diagnosis not present

## 2019-04-11 MED ORDER — TAMOXIFEN CITRATE 10 MG PO TABS: 10.0000 mg | ORAL_TABLET | Freq: Every day | ORAL | 3 refills | Status: DC

## 2019-04-11 MED ORDER — MAGNESIUM 250 MG PO TABS: 1.0000 | ORAL_TABLET | Freq: Every day | ORAL | 0 refills | Status: DC

## 2019-04-11 MED ORDER — VITAMIN C 250 MG PO TABS: 500.0000 mg | ORAL_TABLET | Freq: Every day | ORAL | Status: DC

## 2019-04-11 MED ORDER — QUNOL ULTRA COQ10 100-150 MG-UNIT PO CAPS: 1.0000 | ORAL_CAPSULE | Freq: Every day | ORAL | Status: DC

## 2019-04-11 MED ORDER — ZINC GLUCONATE 50 MG PO TABS: 50.0000 mg | ORAL_TABLET | Freq: Every day | ORAL | Status: DC
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
## 2019-04-15 DIAGNOSIS — Z5181 Encounter for therapeutic drug level monitoring: Secondary | ICD-10-CM | POA: Diagnosis not present

## 2019-04-15 DIAGNOSIS — E7800 Pure hypercholesterolemia, unspecified: Secondary | ICD-10-CM | POA: Diagnosis not present

## 2019-04-15 LAB — COMPREHENSIVE METABOLIC PANEL
ALT: 21 IU/L (ref 0–32)
AST: 33 IU/L (ref 0–40)
Albumin/Globulin Ratio: 2 (ref 1.2–2.2)
Albumin: 4.3 g/dL (ref 3.8–4.8)
Alkaline Phosphatase: 47 IU/L (ref 39–117)
BUN/Creatinine Ratio: 20 (ref 12–28)
BUN: 14 mg/dL (ref 8–27)
Bilirubin Total: 0.4 mg/dL (ref 0.0–1.2)
CO2: 24 mmol/L (ref 20–29)
Calcium: 9.2 mg/dL (ref 8.7–10.3)
Chloride: 104 mmol/L (ref 96–106)
Creatinine, Ser: 0.69 mg/dL (ref 0.57–1.00)
GFR calc Af Amer: 102 mL/min/{1.73_m2} (ref >59)
GFR calc non Af Amer: 88 mL/min/{1.73_m2} (ref >59)
Globulin, Total: 2.2 g/dL (ref 1.5–4.5)
Glucose: 93 mg/dL (ref 65–99)
Potassium: 4.7 mmol/L (ref 3.5–5.2)
Sodium: 141 mmol/L (ref 134–144)
Total Protein: 6.5 g/dL (ref 6.0–8.5)

## 2019-04-15 LAB — LIPID PANEL
Chol/HDL Ratio: 2.4 ratio (ref 0.0–4.4)
Cholesterol, Total: 156 mg/dL (ref 100–199)
HDL: 66 mg/dL (ref >39)
LDL Chol Calc (NIH): 73 mg/dL (ref 0–99)
Triglycerides: 95 mg/dL (ref 0–149)
VLDL Cholesterol Cal: 17 mg/dL (ref 5–40)

## 2019-04-24 ENCOUNTER — Ambulatory Visit: Payer: Medicare Other

## 2019-04-24 ENCOUNTER — Other Ambulatory Visit: Payer: Self-pay

## 2019-04-24 ENCOUNTER — Telehealth: Payer: Self-pay

## 2019-04-24 NOTE — Telephone Encounter (Signed)
Patient called today with questions regarding her second covid vaccine. Patient wanted to know if she should take her celebrex before the injection, what should she do if she has a reaction, and if Dr. Neldon Mc had any guidelines for the vaccine? I asked patient if she had taken it before the injection on her first dose and she stated she did. I informed patient she should do everything she did the first time around and to bring her epipen with her and if she has a delayed reaction she can take benadryl. I also stated that Dr. Neldon Mc does not have a guidline to the covid vaccine.  Patient expressed understanding.   Patient also mentioned she has been having a itchy/red face after eating peanut butter/ peanut oil (chinese food?). I stated she should avoid those foods and if it doesn't help to let us know. Patient expressed understanding.

## 2019-05-03 DIAGNOSIS — M7742 Metatarsalgia, left foot: Secondary | ICD-10-CM | POA: Diagnosis not present

## 2019-05-03 DIAGNOSIS — R2689 Other abnormalities of gait and mobility: Secondary | ICD-10-CM | POA: Diagnosis not present

## 2019-05-03 DIAGNOSIS — M722 Plantar fascial fibromatosis: Secondary | ICD-10-CM | POA: Diagnosis not present

## 2019-05-03 DIAGNOSIS — M7741 Metatarsalgia, right foot: Secondary | ICD-10-CM | POA: Diagnosis not present

## 2019-05-07 ENCOUNTER — Telehealth: Payer: Self-pay

## 2019-05-07 NOTE — Telephone Encounter (Signed)
Returned pt's call and mychart message regarding success of sclerotherapy with reflux. Pt wants to hold off on sclero for now and will wear her compression stockings daily and reach out in future if she wishes to proceed with a treatment.

## 2019-05-13 ENCOUNTER — Ambulatory Visit: Payer: Medicare Other

## 2019-05-22 DIAGNOSIS — M7741 Metatarsalgia, right foot: Secondary | ICD-10-CM | POA: Diagnosis not present

## 2019-05-22 DIAGNOSIS — M7742 Metatarsalgia, left foot: Secondary | ICD-10-CM | POA: Diagnosis not present

## 2019-05-22 DIAGNOSIS — M722 Plantar fascial fibromatosis: Secondary | ICD-10-CM | POA: Diagnosis not present

## 2019-05-22 DIAGNOSIS — M792 Neuralgia and neuritis, unspecified: Secondary | ICD-10-CM | POA: Diagnosis not present

## 2019-06-25 DIAGNOSIS — R300 Dysuria: Secondary | ICD-10-CM | POA: Diagnosis not present

## 2019-06-25 DIAGNOSIS — M19049 Primary osteoarthritis, unspecified hand: Secondary | ICD-10-CM | POA: Diagnosis not present

## 2019-07-03 ENCOUNTER — Other Ambulatory Visit: Payer: Self-pay | Admitting: Cardiovascular Disease

## 2019-07-03 MED ORDER — PRAVASTATIN SODIUM 10 MG PO TABS: 10.0000 mg | ORAL_TABLET | Freq: Every day | ORAL | 2 refills | Status: DC

## 2019-07-03 NOTE — Telephone Encounter (Signed)
*  STAT* If patient is at the pharmacy, call can be transferred to refill team.   1. Which medications need to be refilled? (please list name of each medication and dose if known) pravastatin (PRAVACHOL) 10 MG tablet  2. Which pharmacy/location (including street and city if local pharmacy) is medication to be sent to? [facility], [place], [place] [address]  3. Do they need a 30 day or 90 day supply? 90 day supply

## 2019-07-31 DIAGNOSIS — M19049 Primary osteoarthritis, unspecified hand: Secondary | ICD-10-CM | POA: Diagnosis not present

## 2019-07-31 DIAGNOSIS — J45909 Unspecified asthma, uncomplicated: Secondary | ICD-10-CM | POA: Diagnosis not present

## 2019-07-31 DIAGNOSIS — D0511 Intraductal carcinoma in situ of right breast: Secondary | ICD-10-CM | POA: Diagnosis not present

## 2019-07-31 DIAGNOSIS — Z853 Personal history of malignant neoplasm of breast: Secondary | ICD-10-CM | POA: Diagnosis not present

## 2019-07-31 DIAGNOSIS — E7800 Pure hypercholesterolemia, unspecified: Secondary | ICD-10-CM | POA: Diagnosis not present

## 2019-07-31 DIAGNOSIS — E039 Hypothyroidism, unspecified: Secondary | ICD-10-CM | POA: Diagnosis not present

## 2019-08-13 DIAGNOSIS — E7800 Pure hypercholesterolemia, unspecified: Secondary | ICD-10-CM | POA: Diagnosis not present

## 2019-08-13 DIAGNOSIS — K219 Gastro-esophageal reflux disease without esophagitis: Secondary | ICD-10-CM | POA: Diagnosis not present

## 2019-08-13 DIAGNOSIS — E559 Vitamin D deficiency, unspecified: Secondary | ICD-10-CM | POA: Diagnosis not present

## 2019-08-13 DIAGNOSIS — E039 Hypothyroidism, unspecified: Secondary | ICD-10-CM | POA: Diagnosis not present

## 2019-08-19 DIAGNOSIS — E559 Vitamin D deficiency, unspecified: Secondary | ICD-10-CM | POA: Diagnosis not present

## 2019-08-19 DIAGNOSIS — F419 Anxiety disorder, unspecified: Secondary | ICD-10-CM | POA: Diagnosis not present

## 2019-08-19 DIAGNOSIS — R319 Hematuria, unspecified: Secondary | ICD-10-CM | POA: Diagnosis not present

## 2019-08-19 DIAGNOSIS — G43109 Migraine with aura, not intractable, without status migrainosus: Secondary | ICD-10-CM | POA: Diagnosis not present

## 2019-08-19 DIAGNOSIS — E039 Hypothyroidism, unspecified: Secondary | ICD-10-CM | POA: Diagnosis not present

## 2019-08-19 DIAGNOSIS — Z86000 Personal history of in-situ neoplasm of breast: Secondary | ICD-10-CM | POA: Diagnosis not present

## 2019-08-19 DIAGNOSIS — E7800 Pure hypercholesterolemia, unspecified: Secondary | ICD-10-CM | POA: Diagnosis not present

## 2019-09-05 DIAGNOSIS — Z86000 Personal history of in-situ neoplasm of breast: Secondary | ICD-10-CM | POA: Diagnosis not present

## 2019-09-05 DIAGNOSIS — N952 Postmenopausal atrophic vaginitis: Secondary | ICD-10-CM | POA: Diagnosis not present

## 2019-09-16 DIAGNOSIS — Z86000 Personal history of in-situ neoplasm of breast: Secondary | ICD-10-CM | POA: Diagnosis not present

## 2019-09-16 DIAGNOSIS — E7800 Pure hypercholesterolemia, unspecified: Secondary | ICD-10-CM | POA: Diagnosis not present

## 2019-09-16 DIAGNOSIS — F419 Anxiety disorder, unspecified: Secondary | ICD-10-CM | POA: Diagnosis not present

## 2019-09-16 DIAGNOSIS — G43109 Migraine with aura, not intractable, without status migrainosus: Secondary | ICD-10-CM | POA: Diagnosis not present

## 2019-09-16 DIAGNOSIS — R319 Hematuria, unspecified: Secondary | ICD-10-CM | POA: Diagnosis not present

## 2019-09-16 DIAGNOSIS — E039 Hypothyroidism, unspecified: Secondary | ICD-10-CM | POA: Diagnosis not present

## 2019-09-16 DIAGNOSIS — E559 Vitamin D deficiency, unspecified: Secondary | ICD-10-CM | POA: Diagnosis not present

## 2019-09-30 DIAGNOSIS — E039 Hypothyroidism, unspecified: Secondary | ICD-10-CM | POA: Diagnosis not present

## 2019-09-30 DIAGNOSIS — Z853 Personal history of malignant neoplasm of breast: Secondary | ICD-10-CM | POA: Diagnosis not present

## 2019-09-30 DIAGNOSIS — J45909 Unspecified asthma, uncomplicated: Secondary | ICD-10-CM | POA: Diagnosis not present

## 2019-09-30 DIAGNOSIS — D0511 Intraductal carcinoma in situ of right breast: Secondary | ICD-10-CM | POA: Diagnosis not present

## 2019-09-30 DIAGNOSIS — E7800 Pure hypercholesterolemia, unspecified: Secondary | ICD-10-CM | POA: Diagnosis not present

## 2019-09-30 DIAGNOSIS — M19049 Primary osteoarthritis, unspecified hand: Secondary | ICD-10-CM | POA: Diagnosis not present

## 2019-10-02 DIAGNOSIS — M19042 Primary osteoarthritis, left hand: Secondary | ICD-10-CM | POA: Diagnosis not present

## 2019-10-03 ENCOUNTER — Other Ambulatory Visit: Payer: Self-pay | Admitting: Hematology and Oncology

## 2019-10-03 ENCOUNTER — Other Ambulatory Visit: Payer: Self-pay | Admitting: Cardiovascular Disease

## 2019-10-03 DIAGNOSIS — Z853 Personal history of malignant neoplasm of breast: Secondary | ICD-10-CM

## 2019-10-03 DIAGNOSIS — E7800 Pure hypercholesterolemia, unspecified: Secondary | ICD-10-CM

## 2019-10-17 DIAGNOSIS — Z4789 Encounter for other orthopedic aftercare: Secondary | ICD-10-CM | POA: Diagnosis not present

## 2019-11-08 DIAGNOSIS — R3121 Asymptomatic microscopic hematuria: Secondary | ICD-10-CM | POA: Diagnosis not present

## 2019-11-08 DIAGNOSIS — R350 Frequency of micturition: Secondary | ICD-10-CM | POA: Diagnosis not present

## 2019-11-08 DIAGNOSIS — R351 Nocturia: Secondary | ICD-10-CM | POA: Diagnosis not present

## 2019-11-14 DIAGNOSIS — M19042 Primary osteoarthritis, left hand: Secondary | ICD-10-CM | POA: Diagnosis not present

## 2019-11-14 DIAGNOSIS — Z4789 Encounter for other orthopedic aftercare: Secondary | ICD-10-CM | POA: Diagnosis not present

## 2019-11-20 DIAGNOSIS — M19042 Primary osteoarthritis, left hand: Secondary | ICD-10-CM | POA: Diagnosis not present

## 2019-11-20 DIAGNOSIS — Z6823 Body mass index (BMI) 23.0-23.9, adult: Secondary | ICD-10-CM | POA: Diagnosis not present

## 2019-11-20 DIAGNOSIS — M25562 Pain in left knee: Secondary | ICD-10-CM | POA: Diagnosis not present

## 2019-11-20 DIAGNOSIS — M79672 Pain in left foot: Secondary | ICD-10-CM | POA: Diagnosis not present

## 2019-11-20 DIAGNOSIS — M2550 Pain in unspecified joint: Secondary | ICD-10-CM | POA: Diagnosis not present

## 2019-11-20 DIAGNOSIS — M7711 Lateral epicondylitis, right elbow: Secondary | ICD-10-CM | POA: Diagnosis not present

## 2019-11-20 DIAGNOSIS — M25561 Pain in right knee: Secondary | ICD-10-CM | POA: Diagnosis not present

## 2019-11-20 DIAGNOSIS — M154 Erosive (osteo)arthritis: Secondary | ICD-10-CM | POA: Diagnosis not present

## 2019-11-20 DIAGNOSIS — M19041 Primary osteoarthritis, right hand: Secondary | ICD-10-CM | POA: Diagnosis not present

## 2019-11-20 DIAGNOSIS — M722 Plantar fascial fibromatosis: Secondary | ICD-10-CM | POA: Diagnosis not present

## 2019-11-27 DIAGNOSIS — D485 Neoplasm of uncertain behavior of skin: Secondary | ICD-10-CM | POA: Diagnosis not present

## 2019-11-27 DIAGNOSIS — L650 Telogen effluvium: Secondary | ICD-10-CM | POA: Diagnosis not present

## 2019-11-27 DIAGNOSIS — L578 Other skin changes due to chronic exposure to nonionizing radiation: Secondary | ICD-10-CM | POA: Diagnosis not present

## 2019-11-28 ENCOUNTER — Telehealth: Payer: Self-pay

## 2019-11-28 DIAGNOSIS — D0511 Intraductal carcinoma in situ of right breast: Secondary | ICD-10-CM | POA: Diagnosis not present

## 2019-11-28 DIAGNOSIS — R3129 Other microscopic hematuria: Secondary | ICD-10-CM | POA: Diagnosis not present

## 2019-11-28 DIAGNOSIS — E7800 Pure hypercholesterolemia, unspecified: Secondary | ICD-10-CM | POA: Diagnosis not present

## 2019-11-28 DIAGNOSIS — Z853 Personal history of malignant neoplasm of breast: Secondary | ICD-10-CM | POA: Diagnosis not present

## 2019-11-28 DIAGNOSIS — E039 Hypothyroidism, unspecified: Secondary | ICD-10-CM | POA: Diagnosis not present

## 2019-11-28 DIAGNOSIS — R3121 Asymptomatic microscopic hematuria: Secondary | ICD-10-CM | POA: Diagnosis not present

## 2019-11-28 DIAGNOSIS — K8020 Calculus of gallbladder without cholecystitis without obstruction: Secondary | ICD-10-CM | POA: Diagnosis not present

## 2019-11-28 DIAGNOSIS — J45909 Unspecified asthma, uncomplicated: Secondary | ICD-10-CM | POA: Diagnosis not present

## 2019-11-28 DIAGNOSIS — N323 Diverticulum of bladder: Secondary | ICD-10-CM | POA: Diagnosis not present

## 2019-11-28 DIAGNOSIS — M19049 Primary osteoarthritis, unspecified hand: Secondary | ICD-10-CM | POA: Diagnosis not present

## 2019-11-28 NOTE — Telephone Encounter (Signed)
Pt called and left message with [facility] stating she has not started Tamoxifen as prescribed because [facility] states there is a contraindication between Tamoxifen and Hydroxychloroquine. This LPN attempted to return pt call. No answer, LVM for her to return call.

## 2019-11-29 ENCOUNTER — Telehealth: Payer: Self-pay

## 2019-11-29 NOTE — Telephone Encounter (Signed)
This LPN attempted to return pt's call. Pt did not answer. LVM for pt to return call to office.

## 2019-12-02 ENCOUNTER — Telehealth: Payer: Self-pay | Admitting: Cardiovascular Disease

## 2019-12-02 ENCOUNTER — Telehealth: Payer: Self-pay | Admitting: *Deleted

## 2019-12-02 DIAGNOSIS — E7800 Pure hypercholesterolemia, unspecified: Secondary | ICD-10-CM

## 2019-12-02 NOTE — Telephone Encounter (Signed)
Sure.  Repeat fasting lipids the week before her visit.

## 2019-12-02 NOTE — Telephone Encounter (Signed)
Valerie Snyder states her rheumatologist wants her to start taking hydroxychloroquine. She has heard there is a contraindication to taking both. She has been on Tamoxifen since 03/2015.   1. OK to stop?  2. How long should she wait after stopping to start Hydroxy?  3. Do you need to see her before she makes any changes? Next appt will be March 2022

## 2019-12-02 NOTE — Telephone Encounter (Signed)
Dr Lindi Adie states there are no interactions, OK to take both.

## 2019-12-02 NOTE — Telephone Encounter (Signed)
Advised patient, verbalized understanding  

## 2019-12-02 NOTE — Addendum Note (Signed)
Addended by: Alvina Filbert B on: 12/02/2019 04:35 PM   Modules accepted: Orders

## 2019-12-02 NOTE — Telephone Encounter (Signed)
New Message:     Pt wants to know if she will lab work before her appointment ?

## 2019-12-02 NOTE — Telephone Encounter (Signed)
Pt is asking if she needs labs prior to her 01/16/20 OV with Dr. Oval Linsey... her last labs after her statin increase was 04/15/19, cmet and lipid panels... will forward to Dr. Oval Linsey for review.

## 2019-12-02 NOTE — Telephone Encounter (Signed)
Spoke with patient and confirmed taking Zetia 10 mg daily and Pravastatin 10 mg daily She did have follow up labs at PCP in July. Have printed for Dr Oval Linsey to review to determine next step

## 2019-12-04 DIAGNOSIS — M21961 Unspecified acquired deformity of right lower leg: Secondary | ICD-10-CM | POA: Diagnosis not present

## 2019-12-04 DIAGNOSIS — M7741 Metatarsalgia, right foot: Secondary | ICD-10-CM | POA: Diagnosis not present

## 2019-12-04 DIAGNOSIS — M722 Plantar fascial fibromatosis: Secondary | ICD-10-CM | POA: Diagnosis not present

## 2019-12-04 DIAGNOSIS — M21962 Unspecified acquired deformity of left lower leg: Secondary | ICD-10-CM | POA: Diagnosis not present

## 2019-12-05 ENCOUNTER — Other Ambulatory Visit: Payer: Self-pay

## 2019-12-05 ENCOUNTER — Ambulatory Visit
Admission: RE | Admit: 2019-12-05 | Discharge: 2019-12-05 | Disposition: A | Discharge status: Home / Self Care | Payer: Medicare Other | Source: Ambulatory Visit | Attending: Hematology and Oncology | Admitting: Hematology and Oncology

## 2019-12-05 DIAGNOSIS — Z853 Personal history of malignant neoplasm of breast: Secondary | ICD-10-CM

## 2019-12-05 DIAGNOSIS — R922 Inconclusive mammogram: Secondary | ICD-10-CM | POA: Diagnosis not present

## 2019-12-11 DIAGNOSIS — R3121 Asymptomatic microscopic hematuria: Secondary | ICD-10-CM | POA: Diagnosis not present

## 2019-12-11 DIAGNOSIS — R350 Frequency of micturition: Secondary | ICD-10-CM | POA: Diagnosis not present

## 2019-12-12 ENCOUNTER — Telehealth: Payer: Self-pay | Admitting: *Deleted

## 2019-12-12 DIAGNOSIS — Z170 Estrogen receptor positive status [ER+]: Secondary | ICD-10-CM

## 2019-12-12 DIAGNOSIS — C50211 Malignant neoplasm of 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 breast: Secondary | ICD-10-CM

## 2019-12-12 NOTE — Telephone Encounter (Signed)
Received fax from [facility] with recent chest CT results showing a new left lover lung lobe nodule.  Reviewed results with MD who stated pt will need to receive f/u CT in 1 year. CT scan also shows endometrial thickening.  MD has advised pt to follow up with gynecologist for further evaluation.  Orders placed for CT chest and RN attempt x1 to contact pt regarding MD advice.  No answer, LVM to return call to the office.

## 2019-12-13 DIAGNOSIS — M19042 Primary osteoarthritis, left hand: Secondary | ICD-10-CM | POA: Diagnosis not present

## 2019-12-13 DIAGNOSIS — Z4789 Encounter for other orthopedic aftercare: Secondary | ICD-10-CM | POA: Diagnosis not present

## 2019-12-18 DIAGNOSIS — E039 Hypothyroidism, unspecified: Secondary | ICD-10-CM | POA: Diagnosis not present

## 2019-12-25 DIAGNOSIS — D0511 Intraductal carcinoma in situ of right breast: Secondary | ICD-10-CM | POA: Diagnosis not present

## 2019-12-30 ENCOUNTER — Other Ambulatory Visit: Payer: Self-pay | Admitting: General Surgery

## 2019-12-30 DIAGNOSIS — Z86000 Personal history of in-situ neoplasm of breast: Secondary | ICD-10-CM

## 2020-01-02 ENCOUNTER — Other Ambulatory Visit: Payer: Self-pay | Admitting: Family Medicine

## 2020-01-02 DIAGNOSIS — R918 Other nonspecific abnormal finding of lung field: Secondary | ICD-10-CM

## 2020-01-03 DIAGNOSIS — M19042 Primary osteoarthritis, left hand: Secondary | ICD-10-CM | POA: Diagnosis not present

## 2020-01-06 DIAGNOSIS — M7752 Other enthesopathy of left foot: Secondary | ICD-10-CM | POA: Diagnosis not present

## 2020-01-06 DIAGNOSIS — M7741 Metatarsalgia, right foot: Secondary | ICD-10-CM | POA: Diagnosis not present

## 2020-01-06 DIAGNOSIS — M722 Plantar fascial fibromatosis: Secondary | ICD-10-CM | POA: Diagnosis not present

## 2020-01-06 DIAGNOSIS — M21961 Unspecified acquired deformity of right lower leg: Secondary | ICD-10-CM | POA: Diagnosis not present

## 2020-01-06 DIAGNOSIS — M7742 Metatarsalgia, left foot: Secondary | ICD-10-CM | POA: Diagnosis not present

## 2020-01-07 DIAGNOSIS — E7800 Pure hypercholesterolemia, unspecified: Secondary | ICD-10-CM | POA: Diagnosis not present

## 2020-01-07 LAB — LIPID PANEL
Chol/HDL Ratio: 2.2 ratio (ref 0.0–4.4)
Cholesterol, Total: 181 mg/dL (ref 100–199)
HDL: 83 mg/dL (ref >39)
LDL Chol Calc (NIH): 84 mg/dL (ref 0–99)
Triglycerides: 76 mg/dL (ref 0–149)
VLDL Cholesterol Cal: 14 mg/dL (ref 5–40)

## 2020-01-16 ENCOUNTER — Ambulatory Visit (INDEPENDENT_AMBULATORY_CARE_PROVIDER_SITE_OTHER): Payer: Medicare Other | Admitting: Cardiovascular Disease

## 2020-01-16 ENCOUNTER — Encounter: Payer: Self-pay | Admitting: Cardiovascular Disease

## 2020-01-16 ENCOUNTER — Other Ambulatory Visit: Payer: Self-pay

## 2020-01-16 VITALS — BP 108/72 | HR 76 | Ht 65.0 in | Wt 146.0 lb

## 2020-01-16 DIAGNOSIS — E7800 Pure hypercholesterolemia, unspecified: Secondary | ICD-10-CM | POA: Diagnosis not present

## 2020-01-16 DIAGNOSIS — Z5181 Encounter for therapeutic drug level monitoring: Secondary | ICD-10-CM

## 2020-01-16 DIAGNOSIS — I2510 Atherosclerotic heart disease of native coronary artery without angina pectoris: Secondary | ICD-10-CM | POA: Diagnosis not present

## 2020-01-16 MED ORDER — PRAVASTATIN SODIUM 20 MG PO TABS
20.0000 mg | ORAL_TABLET | Freq: Every day | ORAL | 3 refills | Status: DC
Start: 2020-01-16 — End: 2021-01-19

## 2020-01-16 NOTE — Patient Instructions (Signed)
Medication Instructions:  INCREASE YOUR PRAVACHOL TO 20 MG DAILY   *If you need a refill on your cardiac medications before your next appointment, please call your pharmacy*  Lab Work: FASTING LP/CMET IN [facility]   If you have labs (blood work) drawn today and your tests are completely normal, you will receive your results only by: Marland Kitchen MyChart Message (if you have MyChart) OR . A paper copy in the mail If you have any lab test that is abnormal or we need to change your treatment, we will call you to review the results.  Testing/Procedures: NONE  Follow-Up: At [facility], you and your health needs are our priority.  As part of our continuing mission to provide you with exceptional heart care, we have created designated Provider Care Teams.  These Care Teams include your primary Cardiologist (physician) and Advanced Practice Providers (APPs -  Physician Assistants and Nurse Practitioners) who all work together to provide you with the care you need, when you need it.  We recommend signing up for the patient portal called "MyChart".  Sign up information is provided on this After Visit Summary.  MyChart is used to connect with patients for Virtual Visits (Telemedicine).  Patients are able to view lab/test results, encounter notes, upcoming appointments, etc.  Non-urgent messages can be sent to your provider as well.   To learn more about what you can do with MyChart, go to [email or web address].    Your next appointment:   12 month(s)  You will receive a reminder letter in the mail two months in advance. If you don't receive a letter, please call our office to schedule the follow-up appointment.   The format for your next appointment:   In Person  Provider:   You may see DR [facility]  or one of the following Advanced Practice Providers on your designated Care Team:    Kerin Ransom, PA-C  [place], [place]  Coletta Memos, [place]

## 2020-01-16 NOTE — Progress Notes (Signed)
Cardiology Office Note   Date:  01/16/2020   ID:  Valerie Snyder, DOB 1950/05/23, MRN [id number]  PCP:  Leighton Ruff, MD  Cardiologist:   Skeet Latch, MD  Rheum: Marella Chimes, PA  No chief complaint on file.   History of Present Illness: Valerie Snyder is a 69 y.o. female with asymptomatic coronary calcification and hyperlipidemia here for follow up.  In April 2020 she developed tachycardia while dancing.   She dances daily for at least 20 minutes.  One day she started feeling like heart was racing while dancing.  The episode lasted for around an hour.  She called her PCP who recommended that she be seen in the ED.  She had no chest pressure, shortness of breath, lightheadedness or dizziness.  By the time she got to the ED her symptoms had subsided.  Telemetry was unremarkable.  Since then she denies any recurrent episodes.  She walks daily and bikes several days per week.  She has no exertional symptoms.  She denies lower extremity edema, orthopnea or PND.  She has a longstanding history of hyperlipidemia despite having an excellent diet and exercising regularly.  She has a strong family history of CAD and atrial fibrillation.    [name] had a coronary calcium score 11/2018 that was 72nd perecentile for age/gender.  She started on Zetia and has tolerated it well.  In the past she tried atorvastatin and rosuvastatin but developed myalgias.  She thinks she is tried other statins as well but cannot remember them.  At her last appointment her lipids were above goal so pravastatin was added to her regimen.  She is tolerating this well.  She has no cramping.  She continues to exercise at the [facility], dances for 20 minutes daily and does PT.  She had surgery on her L had due to severe osteoarthritis.  The pain in her hand has improved significantly since surgery.  She saw Dr. Oneida Alar her varicose veins.  She was noted to have reflux and compression socks were recommended.  She has  been working on her diet and try to eat more fish and very little red meat.  She is otherwise without complaint.   Past Medical History:  Diagnosis Date  . Allergy   . Asthma   . Breast cancer of 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 breast ([place]) 11/13/2014  . CAD in native artery 01/15/2019   Asymptomatic coronary calcification on coronary calcium score.  72nd percentile 11/2018.  Marland Kitchen Complication of anesthesia    10 years ago- pt states she stopped breathing and "had to be bagged"  . Concussion 2018  . GERD (gastroesophageal reflux disease)   . H/O bilateral inguinal hernia repair 1992  . Hypercholesteremia   . Hypothyroidism   . Migraines   . Personal history of radiation therapy   . Plantar fasciitis, bilateral   . Pure hypercholesterolemia 10/10/2018  . Radiation 01/12/15-02/04/15   right breast 42.17 gray  . Spinal stenosis   . Thyroid disease     Past Surgical History:  Procedure Laterality Date  . BLADDER SUSPENSION    . BLEPHAROPLASTY Bilateral   . BREAST LUMPECTOMY Right 12/10/2014  . BREAST LUMPECTOMY WITH RADIOACTIVE SEED LOCALIZATION Right 12/10/2014   Procedure: RIGHT BREAST LUMPECTOMY WITH RADIOACTIVE SEED LOCALIZATION;  Surgeon: Autumn Messing III, MD;  Location: [place];  Service: General;  Laterality: Right;  . FOOT SURGERY     plantar fashiectomies from both feet  . HERNIA REPAIR    .  HYSTEROSCOPY WITH D & C N/A 12/27/2017   Procedure: DILATATION AND CURETTAGE /HYSTEROSCOPY;  Surgeon: Janyth Pupa, DO;  Location: [place];  Service: Gynecology;  Laterality: N/A;  . NASAL SEPTUM SURGERY    . neuromas removed from right foot    . 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 POLYPECTOMY  12/27/2017   Procedure: POLYPECTOMY;  Surgeon: Janyth Pupa, DO;  Location: [place];  Service: Gynecology;;  . removal of basal cell carcinoma (nose)    . TONSILECTOMY/ADENOIDECTOMY WITH MYRINGOTOMY    . TONSILLECTOMY        Current Outpatient Medications  Medication Sig Dispense Refill  . albuterol (VENTOLIN HFA) 108 (90 Base) MCG/ACT inhaler Inhale 2 puffs into the lungs every 4-6 hours as needed for cough, wheeze, shortness of breath or cough. 18 g 1  . celecoxib (CELEBREX) 200 MG capsule Take 200 mg by mouth daily.    . cetirizine (ZYRTEC) 10 MG tablet Take 10 mg by mouth daily as needed for allergies.     . Coenzyme Q10 (COQ10) 100 MG CAPS Take by mouth daily.    . Cranberry 450 MG CAPS Take 900 mg by mouth daily.     Marland Kitchen EPINEPHrine (EPIPEN 2-PAK) 0.3 mg/0.3 mL IJ SOAJ injection Inject 0.3 mLs (0.3 mg total) into the muscle once as needed. Reported on 01/20/2015 1 Device 1  . ezetimibe (ZETIA) 10 MG tablet TAKE ONE TABLET BY MOUTH DAILY 90 tablet 1  . hydroxychloroquine (PLAQUENIL) 200 MG tablet in the morning and at bedtime.    . Magnesium 250 MG TABS Take 1 tablet (250 mg total) by mouth daily.  0  . Misc Natural Products (OSTEO BI-FLEX ADV TRIPLE ST PO) Take 2 tablets by mouth daily.    . Multiple Vitamins-Minerals (CENTRUM SILVER ADULT 50+ PO) Take 1 tablet by mouth daily.     . Omega-3 Fatty Acids (OMEGA-3 FISH OIL) 1200 MG CAPS Take 2 capsules (2,400 mg total) by mouth daily. (Patient taking differently: Take 2,000 mg by mouth daily.)    . pantoprazole (PROTONIX) 20 MG tablet Take 1 tablet (20 mg total) by mouth daily.    . Probiotic Product (SOLUBLE FIBER/PROBIOTICS PO) Take 1 capsule by mouth daily. 20 billion    . SUMAtriptan (IMITREX) 25 MG tablet Take 25 mg by mouth every 2 (two) hours as needed for migraine.     . tamoxifen (NOLVADEX) 10 MG tablet Take 1 tablet (10 mg total) by mouth daily. 90 tablet 3  . vitamin C (ASCORBIC ACID) 250 MG tablet Take 2 tablets (500 mg total) by mouth daily.    Marland Kitchen zinc gluconate 50 MG tablet Take 1 tablet (50 mg total) by mouth daily.    Marland Kitchen levothyroxine (SYNTHROID) 75 MCG tablet     . pravastatin (PRAVACHOL) 20 MG tablet Take 1 tablet (20 mg total) by mouth  daily. 90 tablet 3   No current facility-administered medications for this visit.    Allergies:   Bee venom, Keflex [cephalexin], and Toradol [ketorolac tromethamine]    Social History:  The patient  reports that she has never smoked. She has never used smokeless tobacco. She reports that she does not drink alcohol and does not use drugs.   Family History:  The patient's family history includes Aneurysm in her maternal grandmother; Breast cancer in her cousin; Colon polyps in her daughter and mother; Congestive Heart Failure (age of onset: 57) in her mother; Dementia in her father;  Depression in her father, sister, and sister; Emphysema in her paternal grandfather; Heart Problems in her paternal grandfather; Heart attack in her maternal grandfather; Kidney failure in her father; Lung disease in her mother; Multiple sclerosis in her sister; Other in her sister; Pancreatic cancer in her maternal aunt; Stroke in her father; Testicular cancer in her cousin.    ROS:  Please see the history of present illness.   Otherwise, review of systems are positive for none.   All other systems are reviewed and negative.    PHYSICAL EXAM: VS:  BP 108/72   Pulse 76   Ht 5\' 5"  (1.651 m)   Wt 146 lb (66.2 kg)   SpO2 98%   BMI 24.30 kg/m  , BMI Body mass index is 24.3 kg/m. GENERAL:  Well appearing HEENT: Pupils equal round and reactive, fundi not visualized, oral mucosa unremarkable NECK:  No jugular venous distention, waveform within normal limits, carotid upstroke brisk and symmetric, no bruits LUNGS:  Clear to auscultation bilaterally HEART:  RRR.  PMI not displaced or sustained,S1 and S2 within normal limits, no S3, no S4, no clicks, no rubs, no murmurs ABD:  Flat, positive bowel sounds normal in frequency in pitch, no bruits, no rebound, no guarding, no midline pulsatile mass, no hepatomegaly, no splenomegaly EXT:  2 plus pulses throughout, no edema, no cyanosis no clubbing SKIN:  No rashes no  nodules NEURO:  Cranial nerves II through XII grossly intact, motor grossly intact throughout PSYCH:  Cognitively intact, oriented to person place and time   EKG:  EKG is ordered today. The ekg ordered 10/10/2018 demonstrates sinus rhythm.  Rate 87 bpm 01/16/2020: Sinus rhythm.  Rate 73 bpm.  PACs.  Coronary calcium score 11/07/18: IMPRESSION: Coronary calcium score of 95.93. This was 72nd percentile for age and sex matched control.    Recent Labs: 04/15/2019: ALT 21; BUN 14; Creatinine, Ser 0.69; Potassium 4.7; Sodium 141    Lipid Panel    Component Value Date/Time   CHOL 181 01/07/2020 1021   TRIG 76 01/07/2020 1021   HDL 83 01/07/2020 1021   CHOLHDL 2.2 01/07/2020 1021   LDLCALC 84 01/07/2020 1021      Wt Readings from Last 3 Encounters:  01/16/20 146 lb (66.2 kg)  04/11/19 137 lb 1.6 oz (62.2 kg)  03/28/19 138 lb (62.6 kg)      ASSESSMENT AND PLAN:  # Coronary calcification:  # Hyperlipidemia: Coronary calcium score was 92nd percentile.  She continues to exercise regularly and has no exertional symptoms.  She hasn't tolerated statins and has tried several.  S she is tolerating Zetia and pravastatin.  However her lipids remain above goal.  We will increase pravastatin to 20 mg.  Repeat lipids and a CMP in 3 months.  She is also considering adopting a vegan diet.  This was encouraged.  # Palpitations: No recurrent arrhythmias. She does have a family history of atrial fibrillation.  We will get a monitor if it occurs again.    Time spent: 35 minutes-Greater than 50% of this time was spent in counseling, explanation of diagnosis, planning of further management, and coordination of care.   Current medicines are reviewed at length with the patient today.  The patient does not have concerns regarding medicines.  The following changes have been made:  pravastatin  Labs/ tests ordered today include:   Orders Placed This Encounter  Procedures  . Lipid panel  .  Comprehensive metabolic panel  . EKG 12-Lead  Disposition:   FU with Ai Sonnenfeld C. Oval Linsey, MD, [facility] in 1 year.     Signed, Blondie Riggsbee C. Oval Linsey, MD, [facility]  01/16/2020 5:14 PM    [facility]

## 2020-01-17 DIAGNOSIS — Z4789 Encounter for other orthopedic aftercare: Secondary | ICD-10-CM | POA: Diagnosis not present

## 2020-01-17 DIAGNOSIS — M19042 Primary osteoarthritis, left hand: Secondary | ICD-10-CM | POA: Diagnosis not present

## 2020-01-20 ENCOUNTER — Ambulatory Visit
Admission: RE | Admit: 2020-01-20 | Discharge: 2020-01-20 | Disposition: A | Discharge status: Home / Self Care | Payer: Medicare Other | Source: Ambulatory Visit | Attending: General Surgery | Admitting: General Surgery

## 2020-01-20 DIAGNOSIS — Z86000 Personal history of in-situ neoplasm of breast: Secondary | ICD-10-CM

## 2020-01-20 DIAGNOSIS — R928 Other abnormal and inconclusive findings on diagnostic imaging of breast: Secondary | ICD-10-CM | POA: Diagnosis not present

## 2020-01-20 MED ORDER — GADOBUTROL 1 MMOL/ML IV SOLN
6.0000 mL | Freq: Once | INTRAVENOUS | Status: AC | PRN
  Administered 2020-01-20: 6 mL via INTRAVENOUS

## 2020-01-21 ENCOUNTER — Other Ambulatory Visit: Payer: Self-pay

## 2020-01-21 ENCOUNTER — Encounter: Payer: Self-pay | Admitting: Allergy and Immunology

## 2020-01-21 ENCOUNTER — Ambulatory Visit (INDEPENDENT_AMBULATORY_CARE_PROVIDER_SITE_OTHER): Payer: Medicare Other | Admitting: Allergy and Immunology

## 2020-01-21 VITALS — BP 122/82 | HR 85 | Temp 97.8°F | Resp 16 | Ht 65.0 in | Wt 145.0 lb

## 2020-01-21 DIAGNOSIS — K219 Gastro-esophageal reflux disease without esophagitis: Secondary | ICD-10-CM | POA: Diagnosis not present

## 2020-01-21 DIAGNOSIS — J3089 Other allergic rhinitis: Secondary | ICD-10-CM | POA: Diagnosis not present

## 2020-01-21 DIAGNOSIS — Z91038 Other insect allergy status: Secondary | ICD-10-CM | POA: Diagnosis not present

## 2020-01-21 DIAGNOSIS — I2510 Atherosclerotic heart disease of native coronary artery without angina pectoris: Secondary | ICD-10-CM

## 2020-01-21 DIAGNOSIS — J341 Cyst and mucocele of nose and nasal sinus: Secondary | ICD-10-CM | POA: Diagnosis not present

## 2020-01-21 DIAGNOSIS — J4520 Mild intermittent asthma, uncomplicated: Secondary | ICD-10-CM

## 2020-01-21 NOTE — Progress Notes (Signed)
[place] [facility] [place] [facility] [place]   Follow-up Note  Referring Provider: Leighton Ruff, MD Primary Provider: Leighton Ruff, MD Date of Office Visit: 01/21/2020  Subjective:   Valerie Snyder (DOB: 12-22-1950) is a 69 y.o. female who returns to the [facility] on 01/21/2020 in re-evaluation of the following:  HPI: Valerie Snyder returns to this clinic in reevaluation of allergic rhinoconjunctivitis and intermittent asthma and a history of LPR and a history of hymenoptera venom hypersensitivity state.  Her last visit to this clinic was 29 January 2019.  She has had no significant airway issues and rarely uses a short acting bronchodilator and can exercise without any problem.  She has not required a systemic steroid or an antibiotic for any type of airway issue.  Over the course of the past month or so she has developed this fluid-filled sac that fills up intermittently in her left medial nostril and then drains with a stringy material.  This has occurred approximately 4 times in the last month or so.  She is being evaluated for an incidental left pulmonary nodule with a CT scan scheduled tomorrow.  Apparently the cyst was identified when she had a urological CT scan.  She has had very little issues with her reflux and now uses Protonix just a few times per month.  She remains away from hymenoptera exposure and continues to carry an EpiPen.  Allergies as of 01/21/2020      Reactions   Bee Venom Anaphylaxis   Keflex [cephalexin] Anaphylaxis   Puffy eyes and lips   Toradol [ketorolac Tromethamine] Anaphylaxis   Puffy lips and eyes      Medication List      albuterol 108 (90 Base) MCG/ACT inhaler Commonly known as: Ventolin HFA Inhale 2 puffs into the lungs every 4-6 hours as needed for cough, wheeze, shortness of breath or cough.   celecoxib 200 MG capsule Commonly known as: CELEBREX Take 200 mg by mouth daily.   CENTRUM SILVER  ADULT 50+ PO Take 1 tablet by mouth daily.   cetirizine 10 MG tablet Commonly known as: ZYRTEC Take 10 mg by mouth daily as needed for allergies.   CoQ10 100 MG Caps Take by mouth daily.   Cranberry 450 MG Caps Take 900 mg by mouth daily.   EPINEPHrine 0.3 mg/0.3 mL Soaj injection Commonly known as: EpiPen 2-Pak Inject 0.3 mLs (0.3 mg total) into the muscle once as needed. Reported on 01/20/2015   ezetimibe 10 MG tablet Commonly known as: ZETIA TAKE ONE TABLET BY MOUTH DAILY   hydroxychloroquine 200 MG tablet Commonly known as: PLAQUENIL in the morning and at bedtime.   levothyroxine 75 MCG tablet Commonly known as: SYNTHROID   Magnesium 250 MG Tabs Take 1 tablet (250 mg total) by mouth daily.   Omega-3 Fish Oil 1200 MG Caps Take 2 capsules (2,400 mg total) by mouth daily. What changed: how much to take   OSTEO BI-FLEX ADV TRIPLE ST PO Take 2 tablets by mouth daily.   pantoprazole 20 MG tablet Commonly known as: Protonix Take 1 tablet (20 mg total) by mouth daily.   pravastatin 20 MG tablet Commonly known as: PRAVACHOL Take 1 tablet (20 mg total) by mouth daily.   SOLUBLE FIBER/PROBIOTICS PO Take 1 capsule by mouth daily. 20 billion   SUMAtriptan 25 MG tablet Commonly known as: IMITREX Take 25 mg by mouth every 2 (two) hours as needed for migraine.   tamoxifen 10 MG tablet Commonly  known as: NOLVADEX Take 1 tablet (10 mg total) by mouth daily.   vitamin C 250 MG tablet Commonly known as: ASCORBIC ACID Take 2 tablets (500 mg total) by mouth daily.   zinc gluconate 50 MG tablet Take 1 tablet (50 mg total) by mouth daily.       Past Medical History:  Diagnosis Date  . Allergy   . Asthma   . Breast cancer of 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 breast ([place]) 11/13/2014  . CAD in native artery 01/15/2019   Asymptomatic coronary calcification on coronary calcium score.  72nd percentile 11/2018.  Marland Kitchen Complication of anesthesia    10 years ago- pt  states she stopped breathing and "had to be bagged"  . Concussion 2018  . GERD (gastroesophageal reflux disease)   . H/O bilateral inguinal hernia repair 1992  . Hypercholesteremia   . Hypothyroidism   . Migraines   . Personal history of radiation therapy   . Plantar fasciitis, bilateral   . Pure hypercholesterolemia 10/10/2018  . Radiation 01/12/15-02/04/15   right breast 42.17 gray  . Spinal stenosis   . Thyroid disease     Past Surgical History:  Procedure Laterality Date  . BLADDER SUSPENSION    . BLEPHAROPLASTY Bilateral   . BREAST LUMPECTOMY Right 12/10/2014  . BREAST LUMPECTOMY WITH RADIOACTIVE SEED LOCALIZATION Right 12/10/2014   Procedure: RIGHT BREAST LUMPECTOMY WITH RADIOACTIVE SEED LOCALIZATION;  Surgeon: Autumn Messing III, MD;  Location: [place];  Service: General;  Laterality: Right;  . FOOT SURGERY     plantar fashiectomies from both feet  . HERNIA REPAIR    . HYSTEROSCOPY WITH D & C N/A 12/27/2017   Procedure: DILATATION AND CURETTAGE /HYSTEROSCOPY;  Surgeon: Janyth Pupa, DO;  Location: [place];  Service: Gynecology;  Laterality: N/A;  . NASAL SEPTUM SURGERY    . neuromas removed from right foot    . 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 POLYPECTOMY  12/27/2017   Procedure: POLYPECTOMY;  Surgeon: Janyth Pupa, DO;  Location: [place];  Service: Gynecology;;  . removal of basal cell carcinoma (nose)    . TONSILECTOMY/ADENOIDECTOMY WITH MYRINGOTOMY    . TONSILLECTOMY      Review of systems negative except as noted in HPI / PMHx or noted below:  Review of Systems  Constitutional: Negative.   HENT: Negative.   Eyes: Negative.   Respiratory: Negative.   Cardiovascular: Negative.   Gastrointestinal: Negative.   Genitourinary: Negative.   Musculoskeletal: Negative.   Skin: Negative.   Neurological: Negative.   Endo/Heme/Allergies: Negative.   Psychiatric/Behavioral: Negative.      Objective:    Vitals:   01/21/20 1032  BP: 122/82  Pulse: 85  Resp: 16  Temp: 97.8 F (36.6 C)  SpO2: 96%   Height: 5\' 5"  (165.1 cm)  Weight: 145 lb (65.8 kg)   Physical Exam Constitutional:      Appearance: She is not diaphoretic.  HENT:     Head: Normocephalic.     Right Ear: Tympanic membrane, ear canal and external ear normal.     Left Ear: Tympanic membrane, ear canal and external ear normal.     Nose: Nose normal. No mucosal edema or rhinorrhea.     Mouth/Throat:     Mouth: Oropharynx is clear and moist and mucous membranes are normal.     Pharynx: Uvula midline. No oropharyngeal exudate.  Eyes:     Conjunctiva/sclera: Conjunctivae normal.  Neck:  Thyroid: No thyromegaly.     Trachea: Trachea normal. No tracheal tenderness or tracheal deviation.  Cardiovascular:     Rate and Rhythm: Normal rate and regular rhythm.     Heart sounds: Normal heart sounds, S1 normal and S2 normal. No murmur heard.   Pulmonary:     Effort: No respiratory distress.     Breath sounds: Normal breath sounds. No stridor. No wheezing or rales.  Musculoskeletal:        General: No edema.  Lymphadenopathy:     Head:     Right side of head: No tonsillar adenopathy.     Left side of head: No tonsillar adenopathy.     Cervical: No cervical adenopathy.  Skin:    Findings: No erythema or rash.     Nails: There is no clubbing.  Neurological:     Mental Status: She is alert.     Diagnostics:    Spirometry was performed and demonstrated an FEV1 of 1.99 at 85 % of predicted.  Assessment and Plan:   1. Asthma, mild intermittent, well-controlled   2. Other allergic rhinitis   3. Hymenoptera allergy   4. LPRD (laryngopharyngeal reflux disease)   5. Cyst of nasal cavity     1. Continue to treat reflux:   A. Protonix 20 mg 1-7 times per month  2. Continue the following if needed:    A. Zyrtec 10 mg one tablet once a day  B. Mucinex  C. nasal saline / gel  D. Ventolin HFA 2 puffs every 4-6  hours  E. EpiPen  3. Return to clinic in 12 months or earlier if problem  4.  Visit with ENT concerning the left nasal cyst  Valerie Snyder appears to be doing very well regarding her airway issue while using Protonix occasionally and some other medications just as needed.  For now she is going to continue to just use an EpiPen to address her hymenoptera venom hypersensitivity state.  Given the fact that her nasal cyst is filling up with fluid 4 times in the past month I think she will probably need to have ENT take a look at that issue to see if there is a more definitive approach to this problem.  I will see her back in this clinic in approximately 1 year or earlier if there is a problem.  Allena Katz, MD Allergy / Immunology [place]

## 2020-01-21 NOTE — Patient Instructions (Addendum)
  1. Continue to treat reflux:   A. Protonix 20 mg 1-7 times per month  2. Continue the following if needed:   A. Zyrtec 10 mg one tablet once a day  B. Mucinex  C. nasal saline / gel  D. Ventolin HFA 2 puffs every 4-6 hours  E. EpiPen  3. Return to clinic in 12 months or earlier if problem  4.  Visit with ENT concerning the left nasal cyst

## 2020-01-22 ENCOUNTER — Other Ambulatory Visit: Payer: Medicare Other

## 2020-01-22 ENCOUNTER — Encounter: Payer: Self-pay | Admitting: Allergy and Immunology

## 2020-01-22 ENCOUNTER — Ambulatory Visit
Admission: RE | Admit: 2020-01-22 | Discharge: 2020-01-22 | Disposition: A | Discharge status: Home / Self Care | Payer: Medicare Other | Source: Ambulatory Visit | Attending: Family Medicine | Admitting: Family Medicine

## 2020-01-22 DIAGNOSIS — R918 Other nonspecific abnormal finding of lung field: Secondary | ICD-10-CM

## 2020-01-23 ENCOUNTER — Encounter: Payer: Self-pay | Admitting: Allergy and Immunology

## 2020-01-23 ENCOUNTER — Telehealth: Payer: Self-pay | Admitting: Allergy and Immunology

## 2020-01-23 NOTE — Telephone Encounter (Signed)
FYI: Patient wanted to let Dr. Neldon Mc know her CT scan on her lungs should be available in her chart to review. If there are any concerns please give her a call.

## 2020-01-28 DIAGNOSIS — M79642 Pain in left hand: Secondary | ICD-10-CM | POA: Diagnosis not present

## 2020-02-04 DIAGNOSIS — M79642 Pain in left hand: Secondary | ICD-10-CM | POA: Diagnosis not present

## 2020-02-06 ENCOUNTER — Other Ambulatory Visit: Payer: Self-pay | Admitting: Obstetrics and Gynecology

## 2020-02-06 DIAGNOSIS — R102 Pelvic and perineal pain: Secondary | ICD-10-CM | POA: Diagnosis not present

## 2020-02-06 DIAGNOSIS — N950 Postmenopausal bleeding: Secondary | ICD-10-CM | POA: Diagnosis not present

## 2020-02-06 DIAGNOSIS — N840 Polyp of corpus uteri: Secondary | ICD-10-CM | POA: Diagnosis not present

## 2020-02-06 DIAGNOSIS — R9389 Abnormal findings on diagnostic imaging of other specified body structures: Secondary | ICD-10-CM | POA: Diagnosis not present

## 2020-02-07 DIAGNOSIS — M79642 Pain in left hand: Secondary | ICD-10-CM | POA: Diagnosis not present

## 2020-02-10 DIAGNOSIS — M25572 Pain in left ankle and joints of left foot: Secondary | ICD-10-CM | POA: Diagnosis not present

## 2020-02-11 DIAGNOSIS — M79642 Pain in left hand: Secondary | ICD-10-CM | POA: Diagnosis not present

## 2020-02-13 DIAGNOSIS — M79642 Pain in left hand: Secondary | ICD-10-CM | POA: Diagnosis not present

## 2020-02-25 DIAGNOSIS — M79642 Pain in left hand: Secondary | ICD-10-CM | POA: Diagnosis not present

## 2020-02-28 ENCOUNTER — Telehealth: Payer: Self-pay | Admitting: Allergy and Immunology

## 2020-02-28 DIAGNOSIS — J341 Cyst and mucocele of nose and nasal sinus: Secondary | ICD-10-CM

## 2020-02-28 NOTE — Telephone Encounter (Signed)
Patient was seen on 01/20/21. She said at that time she thought Dr. Neldon Mc was going to send a referral to an ENT. She has not heard back. I checked referrals and do not see one was placed.

## 2020-02-28 NOTE — Telephone Encounter (Signed)
Dr. Neldon Mc please advise on referr`al last office note you mentioned: Visit with ENT concerning the left nasal cyst

## 2020-03-02 NOTE — Telephone Encounter (Signed)
Please apologize to patient that we did not follow through with an ENT appointment.  Please schedule that appointment.

## 2020-03-02 NOTE — Telephone Encounter (Signed)
Will place referral to see Dr. Lucia Gaskins for Cyst of Nasal Cavity. Called patient and apologize for our mistake and will have this worked on ASAP. Please place referral

## 2020-03-03 DIAGNOSIS — M79642 Pain in left hand: Secondary | ICD-10-CM | POA: Diagnosis not present

## 2020-03-03 NOTE — Telephone Encounter (Signed)
I called Dr Jennell Corner office, but he will be out of the office for a few weeks.  I contacted Dr Deeann Saint office  & I have her scheduled for 03/18/2020 @1 :30 PM with Dr Benjamine Mola. I was unable to reach her so I left a voicemail for her to call us back to give her the appointment information. Patient is to arrive 15 mins early, bring her insurance cards,  & list of medications/allergies.  I will reach back out to her again on tomorrow.   Thanks

## 2020-03-03 NOTE — Telephone Encounter (Signed)
Su Wooi Teoh, [place], PA [address] [place], [place] [address] [phone]  

## 2020-03-04 NOTE — Telephone Encounter (Signed)
Patient informed. 

## 2020-03-05 DIAGNOSIS — M722 Plantar fascial fibromatosis: Secondary | ICD-10-CM | POA: Diagnosis not present

## 2020-03-05 DIAGNOSIS — M7741 Metatarsalgia, right foot: Secondary | ICD-10-CM | POA: Diagnosis not present

## 2020-03-05 DIAGNOSIS — M21961 Unspecified acquired deformity of right lower leg: Secondary | ICD-10-CM | POA: Diagnosis not present

## 2020-03-05 DIAGNOSIS — M792 Neuralgia and neuritis, unspecified: Secondary | ICD-10-CM | POA: Diagnosis not present

## 2020-03-10 DIAGNOSIS — U071 COVID-19: Secondary | ICD-10-CM | POA: Diagnosis not present

## 2020-03-10 DIAGNOSIS — Z20822 Contact with and (suspected) exposure to covid-19: Secondary | ICD-10-CM | POA: Diagnosis not present

## 2020-03-12 ENCOUNTER — Telehealth: Payer: Self-pay | Admitting: Family

## 2020-03-12 ENCOUNTER — Other Ambulatory Visit: Payer: Self-pay | Admitting: Family

## 2020-03-12 DIAGNOSIS — U071 COVID-19: Secondary | ICD-10-CM

## 2020-03-12 DIAGNOSIS — I2510 Atherosclerotic heart disease of native coronary artery without angina pectoris: Secondary | ICD-10-CM

## 2020-03-12 DIAGNOSIS — E7800 Pure hypercholesterolemia, unspecified: Secondary | ICD-10-CM

## 2020-03-12 DIAGNOSIS — J45909 Unspecified asthma, uncomplicated: Secondary | ICD-10-CM

## 2020-03-12 NOTE — Progress Notes (Signed)
I connected by phone with Valerie Snyder on 03/12/2020 at 1:01 PM to discuss the potential use of a new treatment for mild to moderate COVID-19 viral infection in non-hospitalized patients.  This patient is a 70 y.o. female that meets the FDA criteria for Emergency Use Authorization of COVID monoclonal antibody sotrovimab.  Has a (+) direct SARS-CoV-2 viral test result  Has mild or moderate COVID-19   Is NOT hospitalized due to COVID-19  Is within 10 days of symptom onset  Has at least one of the high risk factor(s) for progression to severe COVID-19 and/or hospitalization as defined in EUA.  Specific high risk criteria : Older age (>/= 72 yo), Cardiovascular disease or hypertension and Chronic Lung Disease   I have spoken and communicated the following to the patient or parent/caregiver regarding COVID monoclonal antibody treatment:  1. FDA has authorized the emergency use for the treatment of mild to moderate COVID-19 in adults and pediatric patients with positive results of direct SARS-CoV-2 viral testing who are 73 years of age and older weighing at least 40 kg, and who are at high risk for progressing to severe COVID-19 and/or hospitalization.  2. The significant known and potential risks and benefits of COVID monoclonal antibody, and the extent to which such potential risks and benefits are unknown.  3. Information on available alternative treatments and the risks and benefits of those alternatives, including clinical trials.  4. Patients treated with COVID monoclonal antibody should continue to self-isolate and use infection control measures (e.g., wear mask, isolate, social distance, avoid sharing personal items, clean and disinfect "high touch" surfaces, and frequent handwashing) according to CDC guidelines.   5. The patient or parent/caregiver has the option to accept or refuse COVID monoclonal antibody treatment.  After reviewing this information with the patient, the  patient has agreed to receive one of the available covid 19 monoclonal antibodies and will be provided an appropriate fact sheet prior to infusion.   Mauricio Po, FNP 03/12/2020 1:01 PM

## 2020-03-12 NOTE — Telephone Encounter (Signed)
Called to discuss with patient about COVID-19 symptoms and the use of one of the available treatments for those with mild to moderate Covid symptoms and at a high risk of hospitalization.  Pt appears to qualify for outpatient treatment due to co-morbid conditions and/or a member of an at-risk group in accordance with the FDA Emergency Use Authorization.    Symptom onset: 03/11/20 Vaccinated: Yes -  Booster? No Immunocompromised?  Qualifiers: Age, hyperlipidemia, asthma, breast cancer  Awaiting testing for Covid with [facility] however her husband is positive and she is now symptomatic. Had her last vaccine several months ago. Currently having body aches, chills, fever, and sore throat.   Discussed the risks, benefits and potential financial costs associated with treatment with Sotrovimab and she wishes to continue with treatment.   Hello Valerie Snyder,   We contacted you because you were recently diagnosed with COVID-19 and may benefit from a new treatment for mild to moderate disease. This treatment helps reduce the chance of being hospitalized. For some patients with medical conditions that may increase the chances of an infection, the treatment also decreases the risk for serious symptoms related to COVID-19.   The Food and Drug Administration (FDA) approved emergency use of a new drug to treat patients with mild to moderate symptoms who have risk factors that could cause severe symptoms related to COVID-19. This new treatment is a monoclonal antibody. It works by attaching like a magnet to the SARS-CoV2 virus (the virus that causes COVID-19) and stops it from infecting more cells in your body. It does not kill the virus, but it prevents it from spreading throughout your body with the hope that it will decrease your symptoms after it is administered.   This new drug is an intravenous (IV) infusion called Sotrovimab that is given over one 30-minute session in our [facility] outpatient  infusion clinic. You will need to stay about 60 minutes after the infusion to ensure you are tolerating it well and to watch for any allergic reaction to the medication. More information will be given to you at the time of your appointment.   Important information:  . The potential side effects: 2-4% of recipients experience nausea, vomiting, diarrhea, dizziness, headaches, itching, worsening fevers or chills for around 24 hours. . There have been no serious infusion-related reactions. . Of the more than 3,000 patients who received the infusion, only one had an allergic response that ended once the infusion was stopped. This is why we monitor all of our patients closely for 60 minutes after the infusion.  . The COVID-19 vaccine (including boosters) must be delayed at least 90 days after receiving this infusion.  . The medication itself is free, but your insurance will be charged an infusion fee. The amount you may owe later varies from insurance to insurance. If you do not have insurance, we can put you in touch with our billing department. Please contact your insurance agent to discuss prior to your appointment if you would like further details about billing specific to your policy. The CMS code is: M54  . If you have been tested outside of a [facility], you MUST bring a copy of your positive test with you the morning of your appointment. You may take a photo of this and upload to your MyChart portal,  have the testing facility fax the result to [phone] or email a copy to [email or web address].    You have been scheduled to receive the monoclonal antibody therapy at  [place]:  03/13/20 at 12:30 pm    The address for the infusion clinic site is:   --The GPS address is [address], and the parking is located near the [employer] building where you will see a "COVID19 Infusion" feather banner marking the entrance to parking. (See photos below.)            --Enter  into the 2nd entrance where the "wave, flag banner" is at the road. Turn into this 2nd entrance and immediately turn left or right to park in one of the marked spaces.   --Please stay in your car and call the desk for assistance inside at [phone]. Let us know which space you are in.    --The average time in the department is roughly 90 minutes to two hours for monoclonal treatment. This includes preparation of the medication, IV start and the required 60-minute monitoring after the infusion.     Should you develop worsening shortness of breath, chest pain or severe breathing problems, please do not wait for this appointment and go to the emergency room for evaluation and treatment instead. You will undergo another oxygen screen before your infusion to ensure this is the best treatment option for you. There is a chance that the best decision may be to send you to the emergency room for evaluation at the time of your appointment.    The day of your visit, you should: Marland Kitchen Get plenty of rest the night before and drink plenty of water. . Eat a light meal/snack before coming and take your medications as prescribed.  . Wear warm, comfortable clothes with a shirt that can roll-up over the elbow (will need IV start).  . Wear a mask.  . Consider bringing an activity to help pass the time.   Terri Piedra, NP 03/12/2020 12:59 PM

## 2020-03-13 ENCOUNTER — Ambulatory Visit (HOSPITAL_COMMUNITY): Payer: Medicare Other

## 2020-03-13 DIAGNOSIS — Z20822 Contact with and (suspected) exposure to covid-19: Secondary | ICD-10-CM | POA: Diagnosis not present

## 2020-03-16 DIAGNOSIS — E7800 Pure hypercholesterolemia, unspecified: Secondary | ICD-10-CM | POA: Diagnosis not present

## 2020-03-16 DIAGNOSIS — E039 Hypothyroidism, unspecified: Secondary | ICD-10-CM | POA: Diagnosis not present

## 2020-03-16 DIAGNOSIS — E559 Vitamin D deficiency, unspecified: Secondary | ICD-10-CM | POA: Diagnosis not present

## 2020-03-17 ENCOUNTER — Telehealth: Payer: Self-pay | Admitting: Allergy and Immunology

## 2020-03-17 NOTE — Telephone Encounter (Signed)
Patient returned call to the office.  Patient and her husband both have Covid at this time.  Patient was told by provider at [facility] walk in clinic who did Covid testing and gave results that she could take Flonase for nasal congestion.  Patient has Fluticasone and wanted to make sure that was okay to use.  Confirmed with patient okay to use Fluticasone and nasal saline prior to Fluticasone.  Patient does have pulse ox at home and is monitoring her oxygen levels.  Patient will seek immediate medical attention if she worsens or develops shortness of breath or oxygen levels drop in low 90%.  Patient and husband will remain in isolation together in house and have supplies they need at this time.  Patient voiced understanding.

## 2020-03-17 NOTE — Telephone Encounter (Signed)
Patient called and said that her and her husband both have covid and they went to the urgent care and was giving fluanisone and wanted to know if she could use flonase instead. [phone].

## 2020-03-17 NOTE — Telephone Encounter (Signed)
Left a message for patient to call the office in regards to this matter. Will need some clarification.

## 2020-03-20 DIAGNOSIS — E559 Vitamin D deficiency, unspecified: Secondary | ICD-10-CM | POA: Diagnosis not present

## 2020-03-20 DIAGNOSIS — E039 Hypothyroidism, unspecified: Secondary | ICD-10-CM | POA: Diagnosis not present

## 2020-03-20 DIAGNOSIS — E7800 Pure hypercholesterolemia, unspecified: Secondary | ICD-10-CM | POA: Diagnosis not present

## 2020-03-24 ENCOUNTER — Other Ambulatory Visit: Payer: Self-pay | Admitting: Cardiovascular Disease

## 2020-03-24 DIAGNOSIS — G43109 Migraine with aura, not intractable, without status migrainosus: Secondary | ICD-10-CM | POA: Diagnosis not present

## 2020-03-24 DIAGNOSIS — J4520 Mild intermittent asthma, uncomplicated: Secondary | ICD-10-CM | POA: Diagnosis not present

## 2020-03-24 DIAGNOSIS — E039 Hypothyroidism, unspecified: Secondary | ICD-10-CM | POA: Diagnosis not present

## 2020-03-24 DIAGNOSIS — I2510 Atherosclerotic heart disease of native coronary artery without angina pectoris: Secondary | ICD-10-CM | POA: Diagnosis not present

## 2020-03-24 DIAGNOSIS — J45909 Unspecified asthma, uncomplicated: Secondary | ICD-10-CM | POA: Diagnosis not present

## 2020-03-24 DIAGNOSIS — M19049 Primary osteoarthritis, unspecified hand: Secondary | ICD-10-CM | POA: Diagnosis not present

## 2020-03-24 DIAGNOSIS — K219 Gastro-esophageal reflux disease without esophagitis: Secondary | ICD-10-CM | POA: Diagnosis not present

## 2020-03-24 DIAGNOSIS — E7800 Pure hypercholesterolemia, unspecified: Secondary | ICD-10-CM

## 2020-03-24 DIAGNOSIS — M19041 Primary osteoarthritis, right hand: Secondary | ICD-10-CM | POA: Diagnosis not present

## 2020-03-24 DIAGNOSIS — M19042 Primary osteoarthritis, left hand: Secondary | ICD-10-CM | POA: Diagnosis not present

## 2020-03-30 DIAGNOSIS — M24572 Contracture, left ankle: Secondary | ICD-10-CM | POA: Diagnosis not present

## 2020-03-30 DIAGNOSIS — M722 Plantar fascial fibromatosis: Secondary | ICD-10-CM | POA: Diagnosis not present

## 2020-03-30 DIAGNOSIS — M792 Neuralgia and neuritis, unspecified: Secondary | ICD-10-CM | POA: Diagnosis not present

## 2020-03-30 DIAGNOSIS — M24571 Contracture, right ankle: Secondary | ICD-10-CM | POA: Diagnosis not present

## 2020-03-31 DIAGNOSIS — H43813 Vitreous degeneration, bilateral: Secondary | ICD-10-CM | POA: Diagnosis not present

## 2020-03-31 DIAGNOSIS — Z961 Presence of intraocular lens: Secondary | ICD-10-CM | POA: Diagnosis not present

## 2020-03-31 DIAGNOSIS — H0100A Unspecified blepharitis right eye, upper and lower eyelids: Secondary | ICD-10-CM | POA: Diagnosis not present

## 2020-03-31 DIAGNOSIS — H0100B Unspecified blepharitis left eye, upper and lower eyelids: Secondary | ICD-10-CM | POA: Diagnosis not present

## 2020-04-01 DIAGNOSIS — L821 Other seborrheic keratosis: Secondary | ICD-10-CM | POA: Diagnosis not present

## 2020-04-01 DIAGNOSIS — D1801 Hemangioma of skin and subcutaneous tissue: Secondary | ICD-10-CM | POA: Diagnosis not present

## 2020-04-01 DIAGNOSIS — L815 Leukoderma, not elsewhere classified: Secondary | ICD-10-CM | POA: Diagnosis not present

## 2020-04-01 DIAGNOSIS — D225 Melanocytic nevi of trunk: Secondary | ICD-10-CM | POA: Diagnosis not present

## 2020-04-01 DIAGNOSIS — L578 Other skin changes due to chronic exposure to nonionizing radiation: Secondary | ICD-10-CM | POA: Diagnosis not present

## 2020-04-02 DIAGNOSIS — H6123 Impacted cerumen, bilateral: Secondary | ICD-10-CM | POA: Diagnosis not present

## 2020-04-02 DIAGNOSIS — J340 Abscess, furuncle and carbuncle of nose: Secondary | ICD-10-CM | POA: Diagnosis not present

## 2020-04-07 ENCOUNTER — Telehealth: Payer: Self-pay | Admitting: Hematology and Oncology

## 2020-04-07 DIAGNOSIS — N950 Postmenopausal bleeding: Secondary | ICD-10-CM | POA: Diagnosis not present

## 2020-04-07 DIAGNOSIS — R9389 Abnormal findings on diagnostic imaging of other specified body structures: Secondary | ICD-10-CM | POA: Diagnosis not present

## 2020-04-07 DIAGNOSIS — M79672 Pain in left foot: Secondary | ICD-10-CM | POA: Diagnosis not present

## 2020-04-07 DIAGNOSIS — M25562 Pain in left knee: Secondary | ICD-10-CM | POA: Diagnosis not present

## 2020-04-07 DIAGNOSIS — Z6824 Body mass index (BMI) 24.0-24.9, adult: Secondary | ICD-10-CM | POA: Diagnosis not present

## 2020-04-07 DIAGNOSIS — M19042 Primary osteoarthritis, left hand: Secondary | ICD-10-CM | POA: Diagnosis not present

## 2020-04-07 DIAGNOSIS — M154 Erosive (osteo)arthritis: Secondary | ICD-10-CM | POA: Diagnosis not present

## 2020-04-07 DIAGNOSIS — M2550 Pain in unspecified joint: Secondary | ICD-10-CM | POA: Diagnosis not present

## 2020-04-07 DIAGNOSIS — N840 Polyp of corpus uteri: Secondary | ICD-10-CM | POA: Diagnosis not present

## 2020-04-07 DIAGNOSIS — M19041 Primary osteoarthritis, right hand: Secondary | ICD-10-CM | POA: Diagnosis not present

## 2020-04-07 DIAGNOSIS — M722 Plantar fascial fibromatosis: Secondary | ICD-10-CM | POA: Diagnosis not present

## 2020-04-07 DIAGNOSIS — M25561 Pain in right knee: Secondary | ICD-10-CM | POA: Diagnosis not present

## 2020-04-07 DIAGNOSIS — M7711 Lateral epicondylitis, right elbow: Secondary | ICD-10-CM | POA: Diagnosis not present

## 2020-04-07 NOTE — Telephone Encounter (Signed)
Scheduled apt per 3/4 sch msg - pt is aware of appt date and time

## 2020-04-08 DIAGNOSIS — M79641 Pain in right hand: Secondary | ICD-10-CM | POA: Diagnosis not present

## 2020-04-08 DIAGNOSIS — M25561 Pain in right knee: Secondary | ICD-10-CM | POA: Diagnosis not present

## 2020-04-08 DIAGNOSIS — M25512 Pain in left shoulder: Secondary | ICD-10-CM | POA: Diagnosis not present

## 2020-04-08 DIAGNOSIS — M5030 Other cervical disc degeneration, unspecified cervical region: Secondary | ICD-10-CM | POA: Diagnosis not present

## 2020-04-08 DIAGNOSIS — M25562 Pain in left knee: Secondary | ICD-10-CM | POA: Diagnosis not present

## 2020-04-08 DIAGNOSIS — S52135A Nondisplaced fracture of neck of left radius, initial encounter for closed fracture: Secondary | ICD-10-CM | POA: Diagnosis not present

## 2020-04-08 DIAGNOSIS — M25531 Pain in right wrist: Secondary | ICD-10-CM | POA: Diagnosis not present

## 2020-04-08 DIAGNOSIS — M542 Cervicalgia: Secondary | ICD-10-CM | POA: Diagnosis not present

## 2020-04-08 DIAGNOSIS — S60211A Contusion of right wrist, initial encounter: Secondary | ICD-10-CM | POA: Diagnosis not present

## 2020-04-08 DIAGNOSIS — M79642 Pain in left hand: Secondary | ICD-10-CM | POA: Diagnosis not present

## 2020-04-08 DIAGNOSIS — S52515A Nondisplaced fracture of left radial styloid process, initial encounter for closed fracture: Secondary | ICD-10-CM | POA: Diagnosis not present

## 2020-04-08 DIAGNOSIS — M25522 Pain in left elbow: Secondary | ICD-10-CM | POA: Diagnosis not present

## 2020-04-15 ENCOUNTER — Other Ambulatory Visit: Payer: Self-pay | Admitting: *Deleted

## 2020-04-15 DIAGNOSIS — Z5181 Encounter for therapeutic drug level monitoring: Secondary | ICD-10-CM

## 2020-04-15 DIAGNOSIS — E7800 Pure hypercholesterolemia, unspecified: Secondary | ICD-10-CM

## 2020-04-15 LAB — LIPID PANEL
Chol/HDL Ratio: 2.3 ratio (ref 0.0–4.4)
Cholesterol, Total: 158 mg/dL (ref 100–199)
HDL: 68 mg/dL (ref >39)
LDL Chol Calc (NIH): 72 mg/dL (ref 0–99)
Triglycerides: 102 mg/dL (ref 0–149)
VLDL Cholesterol Cal: 18 mg/dL (ref 5–40)

## 2020-04-17 DIAGNOSIS — M542 Cervicalgia: Secondary | ICD-10-CM | POA: Diagnosis not present

## 2020-04-20 DIAGNOSIS — M25532 Pain in left wrist: Secondary | ICD-10-CM | POA: Diagnosis not present

## 2020-04-20 DIAGNOSIS — M25522 Pain in left elbow: Secondary | ICD-10-CM | POA: Diagnosis not present

## 2020-04-20 DIAGNOSIS — M542 Cervicalgia: Secondary | ICD-10-CM | POA: Diagnosis not present
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
## 2020-04-21 ENCOUNTER — Other Ambulatory Visit: Payer: Self-pay

## 2020-04-21 ENCOUNTER — Inpatient Hospital Stay: Payer: Medicare Other | Attending: Hematology and Oncology | Admitting: Hematology and Oncology

## 2020-04-21 DIAGNOSIS — Z79899 Other long term (current) drug therapy: Secondary | ICD-10-CM | POA: Insufficient documentation

## 2020-04-21 DIAGNOSIS — F4310 Post-traumatic stress disorder, unspecified: Secondary | ICD-10-CM | POA: Diagnosis not present

## 2020-04-21 DIAGNOSIS — Z923 Personal history of irradiation: Secondary | ICD-10-CM | POA: Insufficient documentation

## 2020-04-21 DIAGNOSIS — I2510 Atherosclerotic heart disease of native coronary artery without angina pectoris: Secondary | ICD-10-CM

## 2020-04-21 DIAGNOSIS — Z170 Estrogen receptor positive status [ER+]: Secondary | ICD-10-CM

## 2020-04-21 DIAGNOSIS — Z853 Personal history of malignant neoplasm of breast: Secondary | ICD-10-CM | POA: Insufficient documentation

## 2020-04-21 DIAGNOSIS — C50211 Malignant neoplasm of 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 breast: Secondary | ICD-10-CM | POA: Diagnosis not present
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
## 2020-04-29 DIAGNOSIS — M24571 Contracture, right ankle: Secondary | ICD-10-CM | POA: Diagnosis not present

## 2020-04-29 DIAGNOSIS — M792 Neuralgia and neuritis, unspecified: Secondary | ICD-10-CM | POA: Diagnosis not present

## 2020-04-29 DIAGNOSIS — M21962 Unspecified acquired deformity of left lower leg: Secondary | ICD-10-CM | POA: Diagnosis not present

## 2020-04-29 DIAGNOSIS — M24572 Contracture, left ankle: Secondary | ICD-10-CM | POA: Diagnosis not present

## 2020-04-29 DIAGNOSIS — M722 Plantar fascial fibromatosis: Secondary | ICD-10-CM | POA: Diagnosis not present

## 2020-05-04 ENCOUNTER — Telehealth: Payer: Self-pay

## 2020-05-04 NOTE — Telephone Encounter (Signed)
Spoke with pt regarding lipid panel results. Per Dr. Oval Linsey we will not make any changes at this time. All questions answered, pt verbalizes understanding.

## 2020-05-05 DIAGNOSIS — M542 Cervicalgia: Secondary | ICD-10-CM | POA: Diagnosis not present

## 2020-05-19 DIAGNOSIS — M19042 Primary osteoarthritis, left hand: Secondary | ICD-10-CM | POA: Diagnosis not present

## 2020-05-21 DIAGNOSIS — I2510 Atherosclerotic heart disease of native coronary artery without angina pectoris: Secondary | ICD-10-CM | POA: Diagnosis not present

## 2020-05-21 DIAGNOSIS — E039 Hypothyroidism, unspecified: Secondary | ICD-10-CM | POA: Diagnosis not present

## 2020-05-21 DIAGNOSIS — J4520 Mild intermittent asthma, uncomplicated: Secondary | ICD-10-CM | POA: Diagnosis not present

## 2020-05-21 DIAGNOSIS — G43109 Migraine with aura, not intractable, without status migrainosus: Secondary | ICD-10-CM | POA: Diagnosis not present

## 2020-05-21 DIAGNOSIS — K219 Gastro-esophageal reflux disease without esophagitis: Secondary | ICD-10-CM | POA: Diagnosis not present

## 2020-05-21 DIAGNOSIS — E7800 Pure hypercholesterolemia, unspecified: Secondary | ICD-10-CM | POA: Diagnosis not present

## 2020-05-21 DIAGNOSIS — J45909 Unspecified asthma, uncomplicated: Secondary | ICD-10-CM | POA: Diagnosis not present

## 2020-05-23 DIAGNOSIS — H0016 Chalazion left eye, unspecified eyelid: Secondary | ICD-10-CM | POA: Diagnosis not present

## 2020-06-01 DIAGNOSIS — M62838 Other muscle spasm: Secondary | ICD-10-CM | POA: Diagnosis not present

## 2020-06-03 DIAGNOSIS — M24572 Contracture, left ankle: Secondary | ICD-10-CM | POA: Diagnosis not present

## 2020-06-03 DIAGNOSIS — M21961 Unspecified acquired deformity of right lower leg: Secondary | ICD-10-CM | POA: Diagnosis not present

## 2020-06-03 DIAGNOSIS — M24571 Contracture, right ankle: Secondary | ICD-10-CM | POA: Diagnosis not present

## 2020-06-03 DIAGNOSIS — D2121 Benign neoplasm of connective and other soft tissue of right lower limb, including hip: Secondary | ICD-10-CM | POA: Diagnosis not present

## 2020-06-10 DIAGNOSIS — H0100B Unspecified blepharitis left eye, upper and lower eyelids: Secondary | ICD-10-CM | POA: Diagnosis not present

## 2020-06-16 DIAGNOSIS — M546 Pain in thoracic spine: Secondary | ICD-10-CM | POA: Diagnosis not present

## 2020-07-01 DIAGNOSIS — M21961 Unspecified acquired deformity of right lower leg: Secondary | ICD-10-CM | POA: Diagnosis not present

## 2020-07-01 DIAGNOSIS — M792 Neuralgia and neuritis, unspecified: Secondary | ICD-10-CM | POA: Diagnosis not present

## 2020-07-01 DIAGNOSIS — M7741 Metatarsalgia, right foot: Secondary | ICD-10-CM | POA: Diagnosis not present

## 2020-07-01 DIAGNOSIS — M722 Plantar fascial fibromatosis: Secondary | ICD-10-CM | POA: Diagnosis not present

## 2020-07-06 DIAGNOSIS — M546 Pain in thoracic spine: Secondary | ICD-10-CM | POA: Diagnosis not present

## 2020-07-07 ENCOUNTER — Other Ambulatory Visit: Payer: Self-pay

## 2020-07-07 ENCOUNTER — Encounter (HOSPITAL_BASED_OUTPATIENT_CLINIC_OR_DEPARTMENT_OTHER): Payer: Self-pay | Admitting: Obstetrics and Gynecology

## 2020-07-12 ENCOUNTER — Other Ambulatory Visit: Payer: Self-pay | Admitting: Obstetrics and Gynecology

## 2020-07-14 ENCOUNTER — Other Ambulatory Visit: Payer: Self-pay

## 2020-07-14 ENCOUNTER — Other Ambulatory Visit: Payer: Self-pay | Admitting: Obstetrics and Gynecology

## 2020-07-14 ENCOUNTER — Encounter (HOSPITAL_BASED_OUTPATIENT_CLINIC_OR_DEPARTMENT_OTHER)
Admission: RE | Admit: 2020-07-14 | Discharge: 2020-07-14 | Disposition: A | Discharge status: Home / Self Care | Payer: Medicare Other | Source: Ambulatory Visit | Attending: Obstetrics and Gynecology | Admitting: Obstetrics and Gynecology

## 2020-07-14 DIAGNOSIS — N840 Polyp of corpus uteri: Secondary | ICD-10-CM | POA: Diagnosis not present

## 2020-07-14 DIAGNOSIS — E039 Hypothyroidism, unspecified: Secondary | ICD-10-CM | POA: Diagnosis not present

## 2020-07-14 DIAGNOSIS — J4520 Mild intermittent asthma, uncomplicated: Secondary | ICD-10-CM | POA: Diagnosis not present

## 2020-07-14 DIAGNOSIS — M19049 Primary osteoarthritis, unspecified hand: Secondary | ICD-10-CM | POA: Diagnosis not present

## 2020-07-14 DIAGNOSIS — Z01812 Encounter for preprocedural laboratory examination: Secondary | ICD-10-CM | POA: Insufficient documentation

## 2020-07-14 DIAGNOSIS — I2510 Atherosclerotic heart disease of native coronary artery without angina pectoris: Secondary | ICD-10-CM | POA: Diagnosis not present

## 2020-07-14 DIAGNOSIS — G43109 Migraine with aura, not intractable, without status migrainosus: Secondary | ICD-10-CM | POA: Diagnosis not present

## 2020-07-14 DIAGNOSIS — Z79899 Other long term (current) drug therapy: Secondary | ICD-10-CM | POA: Diagnosis not present

## 2020-07-14 DIAGNOSIS — Z86018 Personal history of other benign neoplasm: Secondary | ICD-10-CM | POA: Diagnosis not present

## 2020-07-14 DIAGNOSIS — D0511 Intraductal carcinoma in situ of right breast: Secondary | ICD-10-CM | POA: Diagnosis not present

## 2020-07-14 DIAGNOSIS — Z791 Long term (current) use of non-steroidal anti-inflammatories (NSAID): Secondary | ICD-10-CM | POA: Diagnosis not present

## 2020-07-14 DIAGNOSIS — Z853 Personal history of malignant neoplasm of breast: Secondary | ICD-10-CM | POA: Diagnosis not present

## 2020-07-14 DIAGNOSIS — Z881 Allergy status to other antibiotic agents status: Secondary | ICD-10-CM | POA: Diagnosis not present

## 2020-07-14 DIAGNOSIS — Z79890 Hormone replacement therapy (postmenopausal): Secondary | ICD-10-CM | POA: Diagnosis not present

## 2020-07-14 DIAGNOSIS — N950 Postmenopausal bleeding: Secondary | ICD-10-CM | POA: Diagnosis not present

## 2020-07-14 DIAGNOSIS — J45909 Unspecified asthma, uncomplicated: Secondary | ICD-10-CM | POA: Diagnosis not present

## 2020-07-14 DIAGNOSIS — E7800 Pure hypercholesterolemia, unspecified: Secondary | ICD-10-CM | POA: Diagnosis not present

## 2020-07-14 DIAGNOSIS — K219 Gastro-esophageal reflux disease without esophagitis: Secondary | ICD-10-CM | POA: Diagnosis not present

## 2020-07-14 LAB — BASIC METABOLIC PANEL
Anion gap: 7 (ref 5–15)
BUN: 15 mg/dL (ref 8–23)
CO2: 26 mmol/L (ref 22–32)
Calcium: 9.5 mg/dL (ref 8.9–10.3)
Chloride: 105 mmol/L (ref 98–111)
Creatinine, Ser: 0.84 mg/dL (ref 0.44–1.00)
GFR, Estimated: >60 mL/min (ref >60)
Glucose, Bld: 91 mg/dL (ref 70–99)
Potassium: 4 mmol/L (ref 3.5–5.1)
Sodium: 138 mmol/L (ref 135–145)

## 2020-07-14 LAB — CBC
HCT: 41.3 % (ref 36.0–46.0)
Hemoglobin: 13.8 g/dL (ref 12.0–15.0)
MCH: 31.2 pg (ref 26.0–34.0)
MCHC: 33.4 g/dL (ref 30.0–36.0)
MCV: 93.4 fL (ref 80.0–100.0)
Platelets: 196 10*3/uL (ref 150–400)
RBC: 4.42 MIL/uL (ref 3.87–5.11)
RDW: 12.4 % (ref 11.5–15.5)
WBC: 4.3 10*3/uL (ref 4.0–10.5)
nRBC: 0 % (ref 0.0–0.2)

## 2020-07-14 NOTE — H&P (Signed)
Reason for Appointment  1. 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 Does patient report new onset of COVID symptoms? No. Has patient or close contact tested positive for COVID-19? No , not in the past 2 weeks.  General:  70 yo presents for pre-op visit. Pt is schedule for hysteroscopy/D&C w/ polypectomy via MyoSure on 07/15/2020 secondary to benign endometrial polyp. H/o hysteroscopy/D&C w/ polyp removal performed by Dr Janyth Pupa 2 years ago. Pt contacted office on Jan 21, 2020 c/o spotting and LT and RT sided pelvic pain. She went to [facility] in Oct 2021. CT abd/pelvic performed Nov 28, 2019 revealed abnormal endometrial thickening measuring 18 mm. No adnexal masses seen. Pelvic U/S and eval by GYN was recommended. Pt last seen Feb 06, 2020 and reported occasional brown or red vaginal discharge and pelvic pain across her abdomen. 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 U/S on Feb 06, 2020 revealed uterus measuring 8.1 cm x 4.5 cm x 5.2 cm. Endometrium thickened at 22.5 mm. Small fibroid measuring 1.4 cm. Stable in size from prev U/S in Apr 2019. Bilat OV WNL. No adnexal masses seen. EMB on Feb 06, 2020 which revealed benign endometrial polyp. No hyperplasia or malignancy identified. Today, she reports she had negative side effects with small dose of Oxycodone. She states that she does not take Ibuprofen. She states that she has still been experiencing intermittent clotting and mild vaginal bleeding. Pt expressed interest in MonaLisa Touch procedure for vaginal dryness. Pelvic exam normal. No blood in vaginal vault.   Current Medications  Taking   Synthroid(L-Thyroxine Sodium) 75 MCG Tablet 1 tablet in the morning on an empty stomach Orally Once a day  Pantoprazole Sodium 20 MG Tablet Delayed Release 1 tablet Orally as needed,  Notes: [place]  Pravastatin Sodium 20 MG Tablet 1 tablet Orally Once a day  Ezetimibe 10 MG Tablet 1 tablet Orally Once a day, Notes: [place]  SUMAtriptan Succinate 25 MG Tablet 1 tablet as needed Orally once daily as needed, Notes: prn  Fish Oil 1000 MG Capsule 1 capsule Orally Once a day, Notes: OTC  Hydroxychloroquine Sulfate 200 MG Tablet as directed Orally , Notes: rheumatology  CoQ-10 100 MG Capsule as directed Orally   Celecoxib 200 MG Capsule 1 capsule with food Orally Once a day  Key-E(Vitamin E) - Suppository as directed Combination , Notes: as needed (Vit E)  Centrum Silver - Tablet 1 tablet Orally , Notes: OTC  Cetirizine HCl 10 MG Tablet 1 tablet Orally Once a day, Notes: as needed  Cranberry Extract 250 MG Tablet Orally , Notes: OTC  EpiPen 2-Pak(EPINEPHrine) 0.3 MG/0.3ML Solution Auto-injector Injection , Notes: as needed  Magnesium 200 MG Tablet 1 tablet Orally Once a day, Notes: OTC  Osteo Bi-Flex Triple Strength - Tablet Orally , Notes: OTC  Probiotic - Tablet Delayed Release 1 tablet Orally once a day, Notes: OTC  Zinc 50 MG Tablet 1 tablet Orally Once a day, Notes: OTC  Fluticasone Propionate 50 MCG/ACT Suspension 1 spray in each nostril Nasally Once a day, Notes: as needed  Ventolin HFA(Albuterol Sulfate HFA) 108 (90 Base) MCG/ACT Aerosol Solution 1 puff as needed Inhalation every 4 hrs, Notes: as needed  Medication List reviewed and reconciled with the patient   Past Medical History  Hypothyroidism.   Hypercholesterolemia.   GERD, hiatal hernia.   Migraine with  aura.   allergies, allergy-induced asthma, and gets allergy shots (Dr. Ishmael Holter).   Plantar fasciitis, fallen metatarsal head on the right.   Familial idiopathic hematuria.   Spinal stenosis.   History of kidney stones.   History of inguinal hernia repair.   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 Trudie Reed) .   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   History of basal cell carcinoma of the  nose.   Pulmonary nodules on CT .   Aortic atherosclerosis on CT.    Surgical History  tonsillectomy 1954  rhinoplasty- deviated septum/broken nose 1977  benign osteoma from (R) orbital 1991  plantar fasciectomies 2006 & 2008  bladder lift w/ TVT sling and vaginal hernia repair 2010  two neuromas from (R) foot 2010  bilateral ptosis repair and blepharoplasty 2015  basal cell carcinoma removed from nose 2015  lumpectomy- right breast DCIS 12/10/2014  Colon/EGD 09/2015  left cataract 02/07/2019  right cataract 02/21/2019  removal of osteoarthritis in the left hand with fusion of bones 10/2019   Family History  Father: deceased 74 yrs, Dementia, Spinal stenosis, lewy-body syndrome, osteoarthritis, colon polyps, diagnosed with CVA  Mother: deceased 37 yrs, lung disease, MI, colon polyps, diagnosed with Coronary artery disease, CHF (congestive heart failure)  Daughter(s): alive 43 yrs, hypothyroid, colon polyps  Son(s): alive 61 yrs  Spouse: alive, Parkinsons  2 brother(s) , 5 sister(s) .   paternal cousin liver disease\\nneg for colon cancer.   Social History  General:  Tobacco use cigarettes: Never smoked, Tobacco history last updated 04/07/2020, Vaping No. no Alcohol. no Caffeine. no Recreational drug use. DIET: good. Exercise: yes. Marital Status: married, timothy Santellan. Children: 2. OCCUPATION: retired, Pharmacist, hospital.    Gyn History  Sexual activity currently sexually active.  Periods : postmenopausal.  LMP 2000.  Denies H/O Birth control.  Last pap smear date 2014 Dr. Arline Asp in [place], [place].  Last mammogram date 10/2018.  Denies H/O Abnormal pap smear.  Denies H/O STD.    OB History  Number of pregnancies 2.  Pregnancy # 1 live birth, vaginal delivery.  Pregnancy # 2 live birth, vaginal delivery.    Allergies  Keflex: facial swelling - Allergy  Crestor: muscle cramps - Side Effects  Atorvastatin Calcium: muscle cramps - Side Effects  Simvastatin: muscle cramps  - Side Effects  Ketorolac Tromethamine: anaphylaxis   Hospitalization/Major Diagnostic Procedure  childbirth [place]  not in the past year 10/19   Review of Systems  CONSTITUTIONAL:  Chills No. Fatigue No. Fever No. Night sweats No. Recent travel outside [place] No. Sweats No. Weight change No.  OPHTHALMOLOGY:  Blurring of vision no. Change in vision no. Double vision no.  ENT:  Dizziness no. Nose bleeds no. Sore throat no. Teeth pain no.  ALLERGY:  Hives no.  CARDIOLOGY:  Chest pain no. High blood pressure no. Irregular heart beat no. Leg edema no. Palpitations no.  RESPIRATORY:  Shortness of breath no. Cough no. Wheezing no.  UROLOGY:  Pain with urination no. Urinary urgency no. Urinary frequency no. Urinary incontinence no. Difficulty urinating No. Blood in urine No.  GASTROENTEROLOGY:  Abdominal pain no. Appetite change no. Bloating/belching no. Blood in stool or on toilet paper no. Change in bowel movements no. Constipation no. Diarrhea no. Difficulty swallowing no. Nausea no.  FEMALE REPRODUCTIVE:  Vulvar pain no. Vulvar rash no. Abnormal vaginal bleeding yes. Breast pain no. Nipple discharge no. Pain with intercourse no. Pelvic pain no. Unusual vaginal discharge no. Vaginal itching no.  MUSCULOSKELETAL:  Muscle aches no.  NEUROLOGY:  Headache no. Tingling/numbness no. Weakness no.  PSYCHOLOGY:  Depression no. Anxiety no. Nervousness no. Sleep disturbances no. Suicidal ideation no .  ENDOCRINOLOGY:  Excessive thirst no. Excessive urination no. Hair loss no. Heat or cold intolerance no.  HEMATOLOGY/LYMPH:  Abnormal bleeding no. Easy bruising no. Swollen glands no.  DERMATOLOGY:  New/changing skin lesion no. Rash no. Sores no    Vital Signs  Wt 147, Wt change .8 lb, Ht 64.5, BMI 24.84, Pulse sitting 74, BP sitting 126/72.   Examination  General Examination: CONSTITUTIONAL: alert, oriented, NAD . SKIN: moist, warm. EYES: Conjunctiva clear. LUNGS: good I:E efffort noted,  CTA bilat. HEART: RRR. ABDOMEN: soft, non-tender/non-distended, bowel sounds present . FEMALE GENITOURINARY: normal external genitalia, labia - unremarkable, vagina - pink moist mucosa, no lesions or abnormal discharge, cervix - no discharge or lesions or CMT, adnexa - no masses or tenderness, uterus - nontender and normal size on palpation . PSYCH: affect normal, good eye contact.     Physical Examination  Chaperone present:  Chaperone present Whitfield,Dia 04/07/2020 10:34:17 AM > , for pelvic exam.     Assessments     1. Endometrial polyp - N84.0 (Primary)   2. Postmenopausal bleeding - N95.0   3. Thickened endometrium - R93.89   Treatment  1. Endometrial polyp  Notes:  Pt is schedule for hysteroscopy/D&C w/ polypectomy via MyoSure on 06/15, 2022 secondary to benign endometrial polyp. Pt is advised she will be able to return home the same day. Discussed risks of hysteroscopy including but not limited to infection, bleeding, possible perforation of the uterus, with the need for further surgery. Discussed risk of blood transfusion and risk of HIV or hep B&C with blood transfusion. Pt is aware of risks and desires blood transfusion if needed. Pt advised to avoid NSAIDs (Aspirin, Aleve, Advil, Ibuprofen, Motrin) from now until surgery given risk of bleeding during surgery. She may take Tylenol for pain management and continue taking her Celecoxib at this time. She is advised to avoid eating or drinking starting midnight prior to surgery. Pt is advised that she may have watery discharge or cramping after surgery. Discussed post-surgery avoidance of driving for 24 hours and avoidanceintercourse for 2 weeks after procedure. Follow up in 4 weeks for 2 wk post-op visit.    2. Postmenopausal bleeding  LAB: HGB Notes:  Will check blood count in office given pt is still bleeding. Given history of breast cancer, hormonal therapy not indicated. NO bleeding is noted in the vaginal vault today .. keep  appt for hysteroscopy D&C and polypectomy.    3. Thickened endometrium  Notes: Pt is schedule for hysteroscopy/D&C w/ polypectomy via MyoSure on 07/15/2020 secondary to benign endometrial polyp. Follow up in 4 weeks for 2 wk post-op visit.    4. Others  Notes: Recommended AloeCadabra for vaginal lubrication during intercourse

## 2020-07-14 NOTE — H&P (Deleted)
  The note originally documented on this encounter has been moved the the encounter in which it belongs.  

## 2020-07-15 ENCOUNTER — Ambulatory Visit (HOSPITAL_BASED_OUTPATIENT_CLINIC_OR_DEPARTMENT_OTHER)
Admission: RE | Admit: 2020-07-15 | Discharge: 2020-07-15 | Disposition: A | Discharge status: Home / Self Care | Payer: Medicare Other | Attending: Obstetrics and Gynecology | Admitting: Obstetrics and Gynecology

## 2020-07-15 ENCOUNTER — Other Ambulatory Visit: Payer: Self-pay

## 2020-07-15 ENCOUNTER — Encounter (HOSPITAL_BASED_OUTPATIENT_CLINIC_OR_DEPARTMENT_OTHER)
Admission: RE | Disposition: A | Discharge status: Home / Self Care | Payer: Self-pay | Source: Home / Self Care | Attending: Obstetrics and Gynecology

## 2020-07-15 ENCOUNTER — Ambulatory Visit (HOSPITAL_BASED_OUTPATIENT_CLINIC_OR_DEPARTMENT_OTHER): Payer: Medicare Other | Admitting: Anesthesiology

## 2020-07-15 ENCOUNTER — Encounter (HOSPITAL_BASED_OUTPATIENT_CLINIC_OR_DEPARTMENT_OTHER): Payer: Self-pay | Admitting: Obstetrics and Gynecology

## 2020-07-15 DIAGNOSIS — Z79890 Hormone replacement therapy (postmenopausal): Secondary | ICD-10-CM | POA: Insufficient documentation

## 2020-07-15 DIAGNOSIS — N840 Polyp of corpus uteri: Secondary | ICD-10-CM | POA: Diagnosis not present

## 2020-07-15 DIAGNOSIS — Z86018 Personal history of other benign neoplasm: Secondary | ICD-10-CM | POA: Diagnosis not present

## 2020-07-15 DIAGNOSIS — Z791 Long term (current) use of non-steroidal anti-inflammatories (NSAID): Secondary | ICD-10-CM | POA: Diagnosis not present

## 2020-07-15 DIAGNOSIS — E039 Hypothyroidism, unspecified: Secondary | ICD-10-CM | POA: Diagnosis not present

## 2020-07-15 DIAGNOSIS — Z881 Allergy status to other antibiotic agents status: Secondary | ICD-10-CM | POA: Diagnosis not present

## 2020-07-15 DIAGNOSIS — Z79899 Other long term (current) drug therapy: Secondary | ICD-10-CM | POA: Diagnosis not present

## 2020-07-15 DIAGNOSIS — E7800 Pure hypercholesterolemia, unspecified: Secondary | ICD-10-CM | POA: Diagnosis not present

## 2020-07-15 DIAGNOSIS — N950 Postmenopausal bleeding: Secondary | ICD-10-CM | POA: Insufficient documentation

## 2020-07-15 DIAGNOSIS — K219 Gastro-esophageal reflux disease without esophagitis: Secondary | ICD-10-CM | POA: Diagnosis not present

## 2020-07-15 HISTORY — PX: DILATATION & CURETTAGE/HYSTEROSCOPY WITH MYOSURE: SHX6511

## 2020-07-15 HISTORY — DX: Abnormal uterine and vaginal bleeding, unspecified: N93.9

## 2020-07-15 SURGERY — DILATATION & CURETTAGE/HYSTEROSCOPY WITH MYOSURE
Anesthesia: General | Site: Uterus

## 2020-07-15 MED ORDER — EPHEDRINE SULFATE-NACL 50-0.9 MG/10ML-% IV SOSY
PREFILLED_SYRINGE | INTRAVENOUS | Status: DC | PRN
  Administered 2020-07-15: 10 mg via INTRAVENOUS

## 2020-07-15 MED ORDER — OXYCODONE HCL 5 MG PO TABS: 5.0000 mg | ORAL_TABLET | Freq: Once | ORAL | Status: DC | PRN

## 2020-07-15 MED ORDER — HYDROMORPHONE HCL 1 MG/ML IJ SOLN: 0.2500 mg | INTRAMUSCULAR | Status: DC | PRN

## 2020-07-15 MED ORDER — LACTATED RINGERS IV SOLN: INTRAVENOUS | Status: DC

## 2020-07-15 MED ORDER — FENTANYL CITRATE (PF) 100 MCG/2ML IJ SOLN
INTRAMUSCULAR | Status: DC | PRN
  Administered 2020-07-15 (×2): 50 ug via INTRAVENOUS

## 2020-07-15 MED ORDER — ACETAMINOPHEN 500 MG PO TABS
1000.0000 mg | ORAL_TABLET | ORAL | Status: AC
  Administered 2020-07-15: 1000 mg via ORAL

## 2020-07-15 MED ORDER — BUPIVACAINE HCL (PF) 0.25 % IJ SOLN
INTRAMUSCULAR | Status: DC | PRN
  Administered 2020-07-15: 20 mL

## 2020-07-15 MED ORDER — POVIDONE-IODINE 10 % EX SWAB
2.0000 "application " | Freq: Once | CUTANEOUS | Status: AC
  Administered 2020-07-15: 2 via TOPICAL

## 2020-07-15 MED ORDER — LIDOCAINE HCL (CARDIAC) PF 100 MG/5ML IV SOSY
PREFILLED_SYRINGE | INTRAVENOUS | Status: DC | PRN
  Administered 2020-07-15: 30 mg via INTRAVENOUS

## 2020-07-15 MED ORDER — PROPOFOL 10 MG/ML IV BOLUS
INTRAVENOUS | Status: DC | PRN
  Administered 2020-07-15: 150 mg via INTRAVENOUS

## 2020-07-15 MED ORDER — PROMETHAZINE HCL 25 MG/ML IJ SOLN: 6.2500 mg | INTRAMUSCULAR | Status: DC | PRN

## 2020-07-15 MED ORDER — PROPOFOL 500 MG/50ML IV EMUL
INTRAVENOUS | Status: AC
  Filled 2020-07-15: qty 100

## 2020-07-15 MED ORDER — HYDROCODONE-ACETAMINOPHEN 5-325 MG PO TABS: 1.0000 | ORAL_TABLET | Freq: Four times a day (QID) | ORAL | 0 refills | Status: DC | PRN

## 2020-07-15 MED ORDER — OMEGA-3 FISH OIL 1200 MG PO CAPS: 2000.0000 mg | ORAL_CAPSULE | Freq: Every day | ORAL | Status: DC

## 2020-07-15 MED ORDER — DEXAMETHASONE SODIUM PHOSPHATE 4 MG/ML IJ SOLN
INTRAMUSCULAR | Status: DC | PRN
  Administered 2020-07-15: 5 mg via INTRAVENOUS

## 2020-07-15 MED ORDER — SODIUM CHLORIDE 0.9 % IR SOLN
Status: DC | PRN
  Administered 2020-07-15: 1100 mL

## 2020-07-15 MED ORDER — ONDANSETRON HCL 4 MG/2ML IJ SOLN
INTRAMUSCULAR | Status: DC | PRN
  Administered 2020-07-15: 4 mg via INTRAVENOUS

## 2020-07-15 MED ORDER — FENTANYL CITRATE (PF) 100 MCG/2ML IJ SOLN
INTRAMUSCULAR | Status: AC
  Filled 2020-07-15: qty 2

## 2020-07-15 MED ORDER — OXYCODONE HCL 5 MG/5ML PO SOLN
5.0000 mg | Freq: Once | ORAL | Status: DC | PRN
Start: 2020-07-15 — End: 2020-07-15

## 2020-07-15 MED ORDER — AMISULPRIDE (ANTIEMETIC) 5 MG/2ML IV SOLN: 10.0000 mg | Freq: Once | INTRAVENOUS | Status: DC | PRN

## 2020-07-15 MED ORDER — ACETAMINOPHEN 500 MG PO TABS
ORAL_TABLET | ORAL | Status: AC
  Filled 2020-07-15: qty 2

## 2020-07-15 SURGICAL SUPPLY — 16 items
CATH ROBINSON RED A/P 16FR (CATHETERS) IMPLANT
DEVICE MYOSURE LITE (MISCELLANEOUS) ×2 IMPLANT
DEVICE MYOSURE REACH (MISCELLANEOUS) IMPLANT
DILATOR CANAL MILEX (MISCELLANEOUS) IMPLANT
GAUZE 4X4 16PLY RFD (DISPOSABLE) ×2 IMPLANT
GLOVE SURG ENC TEXT LTX SZ6.5 (GLOVE) ×2 IMPLANT
GLOVE SURG UNDER POLY LF SZ6.5 (GLOVE) ×2 IMPLANT
GLOVE SURG UNDER POLY LF SZ7 (GLOVE) ×2 IMPLANT
GOWN STRL REUS W/TWL LRG LVL3 (GOWN DISPOSABLE) ×4 IMPLANT
KIT PROCEDURE FLUENT (KITS) ×2 IMPLANT
PACK VAGINAL MINOR WOMEN LF (CUSTOM PROCEDURE TRAY) ×2 IMPLANT
PAD OB MATERNITY 4.3X12.25 (PERSONAL CARE ITEMS) ×2 IMPLANT
PAD PREP 24X48 CUFFED NSTRL (MISCELLANEOUS) ×2 IMPLANT
SEAL ROD LENS SCOPE MYOSURE (ABLATOR) ×2 IMPLANT
SLEEVE SCD COMPRESS KNEE MED (STOCKING) ×2 IMPLANT
TOWEL GREEN STERILE FF (TOWEL DISPOSABLE) ×2 IMPLANT

## 2020-07-15 NOTE — Discharge Instructions (Signed)

## 2020-07-15 NOTE — H&P (Signed)
Date of Initial H&P: 07/14/2020  History reviewed, patient examined, no change in status, stable for surgery.

## 2020-07-15 NOTE — Anesthesia Preprocedure Evaluation (Signed)
Anesthesia Evaluation  Patient identified by MRN, date of birth, ID band Patient awake    Reviewed: Allergy & Precautions, NPO status , Patient's Chart, lab work & pertinent test results  History of Anesthesia Complications (+) PROLONGED EMERGENCE and history of anesthetic complications  Airway Mallampati: II  TM Distance: >3 FB Neck ROM: Full    Dental no notable dental hx. (+) Teeth Intact, Caps   Pulmonary asthma ,    Pulmonary exam normal breath sounds clear to auscultation       Cardiovascular negative cardio ROS Normal cardiovascular exam Rhythm:Regular Rate:Normal     Neuro/Psych  Headaches, negative psych ROS   GI/Hepatic Neg liver ROS, GERD  Medicated and Controlled,  Endo/Other  Hypothyroidism   Renal/GU negative Renal ROS  negative genitourinary   Musculoskeletal negative musculoskeletal ROS (+)   Abdominal   Peds  Hematology negative hematology ROS (+)   Anesthesia Other Findings   Reproductive/Obstetrics PMB Hx/o Ductal CIS right breast Tamoxifen therapy                             Anesthesia Physical  Anesthesia Plan  ASA: II  Anesthesia Plan: General   Post-op Pain Management:    Induction: Intravenous  PONV Risk Score and Plan: 3 and Ondansetron, Dexamethasone, Treatment may vary due to age or medical condition and Midazolam  Airway Management Planned: LMA  Additional Equipment:   Intra-op Plan:   Post-operative Plan: Extubation in OR  Informed Consent: I have reviewed the patients History and Physical, chart, labs and discussed the procedure including the risks, benefits and alternatives for the proposed anesthesia with the patient or authorized representative who has indicated his/her understanding and acceptance.     Dental advisory given  Plan Discussed with: Surgeon and CRNA  Anesthesia Plan Comments:         Anesthesia Quick  Evaluation

## 2020-07-15 NOTE — Transfer of Care (Signed)
Immediate Anesthesia Transfer of Care Note  Patient: Valerie Snyder  Procedure(s) Performed: DILATATION & CURETTAGE/HYSTEROSCOPY WITHPOLYPECTOMY USING MYOSURE (Uterus)  Patient Location: PACU  Anesthesia Type:General  Level of Consciousness: drowsy  Airway & Oxygen Therapy: Patient Spontanous Breathing and Patient connected to face mask oxygen  Post-op Assessment: Report given to RN and Post -op Vital signs reviewed and stable  Post vital signs: Reviewed and stable  Last Vitals:  Vitals Value Taken Time  BP    Temp    Pulse 85 07/15/20 1453  Resp 15 07/15/20 1453  SpO2 95 % 07/15/20 1453  Vitals shown include unvalidated device data.  Last Pain:  Vitals:   07/15/20 1232  TempSrc: Oral  PainSc: 1       Patients Stated Pain Goal: 1 (58/30/74 6002)  Complications: No notable events documented.

## 2020-07-15 NOTE — Anesthesia Procedure Notes (Signed)
Procedure Name: LMA Insertion Date/Time: 07/15/2020 2:18 PM Performed by: Eulas Post, Oseas Detty W, CRNA Pre-anesthesia Checklist: Patient identified, Emergency Drugs available, Suction available and Patient being monitored Patient Re-evaluated:Patient Re-evaluated prior to induction Oxygen Delivery Method: Circle system utilized Preoxygenation: Pre-oxygenation with 100% oxygen Induction Type: IV induction Ventilation: Mask ventilation without difficulty LMA: LMA inserted LMA Size: 4.0 Number of attempts: 1 Placement Confirmation: positive ETCO2 and breath sounds checked- equal and bilateral Tube secured with: Tape Dental Injury: Teeth and Oropharynx as per pre-operative assessment

## 2020-07-16 ENCOUNTER — Encounter (HOSPITAL_BASED_OUTPATIENT_CLINIC_OR_DEPARTMENT_OTHER): Payer: Self-pay | Admitting: Obstetrics and Gynecology

## 2020-07-16 LAB — SURGICAL PATHOLOGY

## 2020-07-16 NOTE — Anesthesia Postprocedure Evaluation (Signed)
Anesthesia Post Note  Patient: Valerie Snyder  Procedure(s) Performed: [place] (Uterus)     Patient location during evaluation: PACU Anesthesia Type: General Level of consciousness: awake and alert Pain management: pain level controlled Vital Signs Assessment: post-procedure vital signs reviewed and stable Respiratory status: spontaneous breathing, nonlabored ventilation and respiratory function stable Cardiovascular status: blood pressure returned to baseline and stable Postop Assessment: no apparent nausea or vomiting Anesthetic complications: no   No notable events documented.  Last Vitals:  Vitals:   07/15/20 1515 07/15/20 1551  BP: (!) 105/53 (!) 113/54  Pulse: 71 68  Resp: 18 17  Temp:  36.5 C  SpO2: 94% 96%    Last Pain:  Vitals:   07/15/20 1500  TempSrc:   PainSc: 0-No pain   Pain Goal: Patients Stated Pain Goal: 1 (07/15/20 1232)                 Lynda Rainwater

## 2020-07-23 DIAGNOSIS — M5134 Other intervertebral disc degeneration, thoracic region: Secondary | ICD-10-CM | POA: Diagnosis not present

## 2020-07-30 NOTE — Op Note (Signed)
07/15/2020  1:33 PM  PATIENT:  Valerie Snyder  70 y.o. female  PRE-OPERATIVE DIAGNOSIS:  Postmenopausal Bleeding (N95.0)  POST-OPERATIVE DIAGNOSIS:  Postmenopausal Bleeding (N95.0)  PROCEDURE:  Procedure(s): DILATATION & CURETTAGE/HYSTEROSCOPY WITHPOLYPECTOMY USING MYOSURE (N/A)  SURGEON:  Surgeon(s) and Role:    Christophe Louis, MD - Primary  PHYSICIAN ASSISTANT: None  ASSISTANTS: none   ANESTHESIA:   general  EBL: 5 cc    BLOOD ADMINISTERED:none  DRAINS: none   LOCAL MEDICATIONS USED:  MARCAINE     SPECIMEN:  Source of Specimen:  Endometrial polyps and curettings   DISPOSITION OF SPECIMEN:  PATHOLOGY  COUNTS:  YES  TOURNIQUET:  * No tourniquets in log *  DICTATION: .Note written in EPIC  PLAN OF CARE: Discharge to home after PACU  PATIENT DISPOSITION:  PACU - hemodynamically stable.   Delay start of Pharmacological VTE agent (>24hrs) due to surgical blood loss or risk of bleeding: not applicable  Findings: Normal external genitalia. Normal appearing cervix . 2 benign appearing endometrial polyps    Procedure: Patient was taken to the operating room where she was placed under general anesthesia. She was placed in the dorsal lithotomy position. She was prepped and draped in the usual sterile fashion. A speculum was placed into the vaginal vault. The anterior lip of the cervix was grasped with a single-tooth tenaculum. Quarter percent Marcaine was injected at the 4 and 8:00 positions of the cervix. The cervix was then sounded to 6 cm. The cervix was dilated to approximately 6 mm. Mysosure operative  hysteroscope was inserted. The findings noted above. Myosure reach  blade was introduced throught the hysteroscope. The endometrial mass was removed in 5 minute.  There was no evidence of perforation. Hysteroscope was then removed. Sharp curette was inserted and endometrial curettings were obtained. The hysteroscope was reinserted. There was no sign of uterine  perforation.  .The hysteroscope was removed.  The single-tooth tenaculum was removed from the anterior lip of the cervix. Patient was noted to have bleeding from the tenaculum site. Silver nitrate was applied and excellent hemostasis was noted. The speculum was removed from the patient's vagina. She was awakened from anesthesia taken care  To the recovery  room awake and in stable condition. Sponge lap and needle counts were correct x2.

## 2020-08-11 ENCOUNTER — Other Ambulatory Visit: Payer: Self-pay | Admitting: Obstetrics and Gynecology

## 2020-08-11 DIAGNOSIS — E2839 Other primary ovarian failure: Secondary | ICD-10-CM

## 2020-08-19 DIAGNOSIS — Z86010 Personal history of colonic polyps: Secondary | ICD-10-CM | POA: Diagnosis not present

## 2020-08-19 DIAGNOSIS — R1030 Lower abdominal pain, unspecified: Secondary | ICD-10-CM | POA: Diagnosis not present

## 2020-08-19 DIAGNOSIS — K219 Gastro-esophageal reflux disease without esophagitis: Secondary | ICD-10-CM | POA: Diagnosis not present

## 2020-08-19 DIAGNOSIS — R1319 Other dysphagia: Secondary | ICD-10-CM | POA: Diagnosis not present

## 2020-08-31 DIAGNOSIS — M9901 Segmental and somatic dysfunction of cervical region: Secondary | ICD-10-CM | POA: Diagnosis not present

## 2020-08-31 DIAGNOSIS — M9902 Segmental and somatic dysfunction of thoracic region: Secondary | ICD-10-CM | POA: Diagnosis not present

## 2020-08-31 DIAGNOSIS — M5134 Other intervertebral disc degeneration, thoracic region: Secondary | ICD-10-CM | POA: Diagnosis not present

## 2020-08-31 DIAGNOSIS — M47812 Spondylosis without myelopathy or radiculopathy, cervical region: Secondary | ICD-10-CM | POA: Diagnosis not present

## 2020-09-02 DIAGNOSIS — M792 Neuralgia and neuritis, unspecified: Secondary | ICD-10-CM | POA: Diagnosis not present

## 2020-09-02 DIAGNOSIS — M7741 Metatarsalgia, right foot: Secondary | ICD-10-CM | POA: Diagnosis not present

## 2020-09-02 DIAGNOSIS — M722 Plantar fascial fibromatosis: Secondary | ICD-10-CM | POA: Diagnosis not present

## 2020-09-02 DIAGNOSIS — M21961 Unspecified acquired deformity of right lower leg: Secondary | ICD-10-CM | POA: Diagnosis not present

## 2020-09-03 ENCOUNTER — Ambulatory Visit
Admission: RE | Admit: 2020-09-03 | Discharge: 2020-09-03 | Disposition: A | Discharge status: Home / Self Care | Payer: Medicare Other | Source: Ambulatory Visit | Attending: Obstetrics and Gynecology | Admitting: Obstetrics and Gynecology

## 2020-09-03 ENCOUNTER — Other Ambulatory Visit: Payer: Self-pay

## 2020-09-03 DIAGNOSIS — M5134 Other intervertebral disc degeneration, thoracic region: Secondary | ICD-10-CM | POA: Diagnosis not present

## 2020-09-03 DIAGNOSIS — M9902 Segmental and somatic dysfunction of thoracic region: Secondary | ICD-10-CM | POA: Diagnosis not present

## 2020-09-03 DIAGNOSIS — E2839 Other primary ovarian failure: Secondary | ICD-10-CM

## 2020-09-03 DIAGNOSIS — Z780 Asymptomatic menopausal state: Secondary | ICD-10-CM | POA: Diagnosis not present

## 2020-09-03 DIAGNOSIS — M9901 Segmental and somatic dysfunction of cervical region: Secondary | ICD-10-CM | POA: Diagnosis not present

## 2020-09-03 DIAGNOSIS — M47812 Spondylosis without myelopathy or radiculopathy, cervical region: Secondary | ICD-10-CM | POA: Diagnosis not present

## 2020-09-07 DIAGNOSIS — M9901 Segmental and somatic dysfunction of cervical region: Secondary | ICD-10-CM | POA: Diagnosis not present

## 2020-09-07 DIAGNOSIS — M47812 Spondylosis without myelopathy or radiculopathy, cervical region: Secondary | ICD-10-CM | POA: Diagnosis not present

## 2020-09-07 DIAGNOSIS — M9902 Segmental and somatic dysfunction of thoracic region: Secondary | ICD-10-CM | POA: Diagnosis not present

## 2020-09-07 DIAGNOSIS — M5134 Other intervertebral disc degeneration, thoracic region: Secondary | ICD-10-CM | POA: Diagnosis not present

## 2020-09-09 DIAGNOSIS — Z79899 Other long term (current) drug therapy: Secondary | ICD-10-CM | POA: Diagnosis not present

## 2020-09-09 DIAGNOSIS — K219 Gastro-esophageal reflux disease without esophagitis: Secondary | ICD-10-CM | POA: Diagnosis not present

## 2020-09-09 DIAGNOSIS — E7800 Pure hypercholesterolemia, unspecified: Secondary | ICD-10-CM | POA: Diagnosis not present

## 2020-09-09 DIAGNOSIS — E559 Vitamin D deficiency, unspecified: Secondary | ICD-10-CM | POA: Diagnosis not present

## 2020-09-09 DIAGNOSIS — E039 Hypothyroidism, unspecified: Secondary | ICD-10-CM | POA: Diagnosis not present

## 2020-09-10 DIAGNOSIS — M47812 Spondylosis without myelopathy or radiculopathy, cervical region: Secondary | ICD-10-CM | POA: Diagnosis not present

## 2020-09-10 DIAGNOSIS — M5134 Other intervertebral disc degeneration, thoracic region: Secondary | ICD-10-CM | POA: Diagnosis not present

## 2020-09-10 DIAGNOSIS — M9902 Segmental and somatic dysfunction of thoracic region: Secondary | ICD-10-CM | POA: Diagnosis not present

## 2020-09-10 DIAGNOSIS — M9901 Segmental and somatic dysfunction of cervical region: Secondary | ICD-10-CM | POA: Diagnosis not present

## 2020-09-14 DIAGNOSIS — M5134 Other intervertebral disc degeneration, thoracic region: Secondary | ICD-10-CM | POA: Diagnosis not present

## 2020-09-14 DIAGNOSIS — M9901 Segmental and somatic dysfunction of cervical region: Secondary | ICD-10-CM | POA: Diagnosis not present

## 2020-09-14 DIAGNOSIS — M9902 Segmental and somatic dysfunction of thoracic region: Secondary | ICD-10-CM | POA: Diagnosis not present

## 2020-09-14 DIAGNOSIS — M47812 Spondylosis without myelopathy or radiculopathy, cervical region: Secondary | ICD-10-CM | POA: Diagnosis not present

## 2020-09-15 DIAGNOSIS — R5383 Other fatigue: Secondary | ICD-10-CM | POA: Diagnosis not present

## 2020-09-15 DIAGNOSIS — Z79899 Other long term (current) drug therapy: Secondary | ICD-10-CM | POA: Diagnosis not present

## 2020-09-15 DIAGNOSIS — E7800 Pure hypercholesterolemia, unspecified: Secondary | ICD-10-CM | POA: Diagnosis not present

## 2020-09-15 DIAGNOSIS — M722 Plantar fascial fibromatosis: Secondary | ICD-10-CM | POA: Diagnosis not present

## 2020-09-15 DIAGNOSIS — K219 Gastro-esophageal reflux disease without esophagitis: Secondary | ICD-10-CM | POA: Diagnosis not present

## 2020-09-15 DIAGNOSIS — E039 Hypothyroidism, unspecified: Secondary | ICD-10-CM | POA: Diagnosis not present

## 2020-09-15 DIAGNOSIS — Z6824 Body mass index (BMI) 24.0-24.9, adult: Secondary | ICD-10-CM | POA: Diagnosis not present

## 2020-09-15 DIAGNOSIS — E559 Vitamin D deficiency, unspecified: Secondary | ICD-10-CM | POA: Diagnosis not present

## 2020-09-16 DIAGNOSIS — M9902 Segmental and somatic dysfunction of thoracic region: Secondary | ICD-10-CM | POA: Diagnosis not present

## 2020-09-16 DIAGNOSIS — M5134 Other intervertebral disc degeneration, thoracic region: Secondary | ICD-10-CM | POA: Diagnosis not present

## 2020-09-16 DIAGNOSIS — M47812 Spondylosis without myelopathy or radiculopathy, cervical region: Secondary | ICD-10-CM | POA: Diagnosis not present

## 2020-09-16 DIAGNOSIS — M9901 Segmental and somatic dysfunction of cervical region: Secondary | ICD-10-CM | POA: Diagnosis not present

## 2020-09-22 DIAGNOSIS — M9901 Segmental and somatic dysfunction of cervical region: Secondary | ICD-10-CM | POA: Diagnosis not present

## 2020-09-22 DIAGNOSIS — J45909 Unspecified asthma, uncomplicated: Secondary | ICD-10-CM | POA: Diagnosis not present

## 2020-09-22 DIAGNOSIS — J4520 Mild intermittent asthma, uncomplicated: Secondary | ICD-10-CM | POA: Diagnosis not present

## 2020-09-22 DIAGNOSIS — E039 Hypothyroidism, unspecified: Secondary | ICD-10-CM | POA: Diagnosis not present

## 2020-09-22 DIAGNOSIS — G43109 Migraine with aura, not intractable, without status migrainosus: Secondary | ICD-10-CM | POA: Diagnosis not present

## 2020-09-22 DIAGNOSIS — M47812 Spondylosis without myelopathy or radiculopathy, cervical region: Secondary | ICD-10-CM | POA: Diagnosis not present

## 2020-09-22 DIAGNOSIS — M5134 Other intervertebral disc degeneration, thoracic region: Secondary | ICD-10-CM | POA: Diagnosis not present

## 2020-09-22 DIAGNOSIS — I2510 Atherosclerotic heart disease of native coronary artery without angina pectoris: Secondary | ICD-10-CM | POA: Diagnosis not present

## 2020-09-22 DIAGNOSIS — E7800 Pure hypercholesterolemia, unspecified: Secondary | ICD-10-CM | POA: Diagnosis not present

## 2020-09-22 DIAGNOSIS — K219 Gastro-esophageal reflux disease without esophagitis: Secondary | ICD-10-CM | POA: Diagnosis not present

## 2020-09-22 DIAGNOSIS — M19049 Primary osteoarthritis, unspecified hand: Secondary | ICD-10-CM | POA: Diagnosis not present

## 2020-09-22 DIAGNOSIS — M9902 Segmental and somatic dysfunction of thoracic region: Secondary | ICD-10-CM | POA: Diagnosis not present

## 2020-09-24 DIAGNOSIS — M9901 Segmental and somatic dysfunction of cervical region: Secondary | ICD-10-CM | POA: Diagnosis not present

## 2020-09-24 DIAGNOSIS — M9902 Segmental and somatic dysfunction of thoracic region: Secondary | ICD-10-CM | POA: Diagnosis not present

## 2020-09-24 DIAGNOSIS — M47812 Spondylosis without myelopathy or radiculopathy, cervical region: Secondary | ICD-10-CM | POA: Diagnosis not present

## 2020-09-24 DIAGNOSIS — M5134 Other intervertebral disc degeneration, thoracic region: Secondary | ICD-10-CM | POA: Diagnosis not present

## 2020-09-30 DIAGNOSIS — M5134 Other intervertebral disc degeneration, thoracic region: Secondary | ICD-10-CM | POA: Diagnosis not present

## 2020-09-30 DIAGNOSIS — M9902 Segmental and somatic dysfunction of thoracic region: Secondary | ICD-10-CM | POA: Diagnosis not present

## 2020-09-30 DIAGNOSIS — M9901 Segmental and somatic dysfunction of cervical region: Secondary | ICD-10-CM | POA: Diagnosis not present

## 2020-09-30 DIAGNOSIS — M47812 Spondylosis without myelopathy or radiculopathy, cervical region: Secondary | ICD-10-CM | POA: Diagnosis not present

## 2020-09-30 DIAGNOSIS — Z20822 Contact with and (suspected) exposure to covid-19: Secondary | ICD-10-CM | POA: Diagnosis not present

## 2020-10-14 DIAGNOSIS — M9902 Segmental and somatic dysfunction of thoracic region: Secondary | ICD-10-CM | POA: Diagnosis not present

## 2020-10-14 DIAGNOSIS — M47812 Spondylosis without myelopathy or radiculopathy, cervical region: Secondary | ICD-10-CM | POA: Diagnosis not present

## 2020-10-14 DIAGNOSIS — M9901 Segmental and somatic dysfunction of cervical region: Secondary | ICD-10-CM | POA: Diagnosis not present

## 2020-10-14 DIAGNOSIS — M5134 Other intervertebral disc degeneration, thoracic region: Secondary | ICD-10-CM | POA: Diagnosis not present

## 2020-10-20 DIAGNOSIS — M19041 Primary osteoarthritis, right hand: Secondary | ICD-10-CM | POA: Diagnosis not present

## 2020-10-20 DIAGNOSIS — M19042 Primary osteoarthritis, left hand: Secondary | ICD-10-CM | POA: Diagnosis not present

## 2020-10-21 ENCOUNTER — Other Ambulatory Visit: Payer: Self-pay | Admitting: Hematology and Oncology

## 2020-10-21 DIAGNOSIS — Z1231 Encounter for screening mammogram for malignant neoplasm of breast: Secondary | ICD-10-CM

## 2020-10-23 DIAGNOSIS — M47812 Spondylosis without myelopathy or radiculopathy, cervical region: Secondary | ICD-10-CM | POA: Diagnosis not present

## 2020-10-23 DIAGNOSIS — M9901 Segmental and somatic dysfunction of cervical region: Secondary | ICD-10-CM | POA: Diagnosis not present

## 2020-10-23 DIAGNOSIS — M5134 Other intervertebral disc degeneration, thoracic region: Secondary | ICD-10-CM | POA: Diagnosis not present

## 2020-10-23 DIAGNOSIS — M9902 Segmental and somatic dysfunction of thoracic region: Secondary | ICD-10-CM | POA: Diagnosis not present

## 2020-10-26 ENCOUNTER — Ambulatory Visit (INDEPENDENT_AMBULATORY_CARE_PROVIDER_SITE_OTHER): Payer: Medicare Other | Admitting: Podiatry

## 2020-10-26 ENCOUNTER — Other Ambulatory Visit: Payer: Self-pay

## 2020-10-26 ENCOUNTER — Ambulatory Visit (INDEPENDENT_AMBULATORY_CARE_PROVIDER_SITE_OTHER): Payer: Medicare Other

## 2020-10-26 DIAGNOSIS — G5752 Tarsal tunnel syndrome, left lower limb: Secondary | ICD-10-CM

## 2020-10-26 DIAGNOSIS — I2510 Atherosclerotic heart disease of native coronary artery without angina pectoris: Secondary | ICD-10-CM

## 2020-10-26 DIAGNOSIS — M722 Plantar fascial fibromatosis: Secondary | ICD-10-CM

## 2020-10-26 NOTE — Patient Instructions (Signed)
I have ordered a MRI of the left ankle. If you do not hear for them about scheduling within the next 1 week, or you have any questions please give [place] a call at [phone].

## 2020-10-26 NOTE — Progress Notes (Signed)
mri
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
## 2020-11-05 DIAGNOSIS — M9902 Segmental and somatic dysfunction of thoracic region: Secondary | ICD-10-CM | POA: Diagnosis not present

## 2020-11-05 DIAGNOSIS — M5134 Other intervertebral disc degeneration, thoracic region: Secondary | ICD-10-CM | POA: Diagnosis not present

## 2020-11-05 DIAGNOSIS — M47812 Spondylosis without myelopathy or radiculopathy, cervical region: Secondary | ICD-10-CM | POA: Diagnosis not present

## 2020-11-05 DIAGNOSIS — M9901 Segmental and somatic dysfunction of cervical region: Secondary | ICD-10-CM | POA: Diagnosis not present

## 2020-11-13 ENCOUNTER — Ambulatory Visit
Admission: RE | Admit: 2020-11-13 | Discharge: 2020-11-13 | Disposition: A | Discharge status: Home / Self Care | Payer: Medicare Other | Source: Ambulatory Visit | Attending: Podiatry | Admitting: Podiatry

## 2020-11-13 DIAGNOSIS — M85672 Other cyst of bone, left ankle and foot: Secondary | ICD-10-CM | POA: Diagnosis not present

## 2020-11-13 DIAGNOSIS — M722 Plantar fascial fibromatosis: Secondary | ICD-10-CM

## 2020-11-13 DIAGNOSIS — G5752 Tarsal tunnel syndrome, left lower limb: Secondary | ICD-10-CM

## 2020-11-13 DIAGNOSIS — R600 Localized edema: Secondary | ICD-10-CM | POA: Diagnosis not present

## 2020-11-13 DIAGNOSIS — G8929 Other chronic pain: Secondary | ICD-10-CM | POA: Diagnosis not present

## 2020-11-17 ENCOUNTER — Other Ambulatory Visit: Payer: Self-pay | Admitting: Podiatry

## 2020-11-17 DIAGNOSIS — G5752 Tarsal tunnel syndrome, left lower limb: Secondary | ICD-10-CM

## 2020-11-18 ENCOUNTER — Encounter: Payer: Self-pay | Admitting: Podiatry

## 2020-11-19 DIAGNOSIS — M47812 Spondylosis without myelopathy or radiculopathy, cervical region: Secondary | ICD-10-CM | POA: Diagnosis not present

## 2020-11-19 DIAGNOSIS — M9901 Segmental and somatic dysfunction of cervical region: Secondary | ICD-10-CM | POA: Diagnosis not present

## 2020-11-19 DIAGNOSIS — M9902 Segmental and somatic dysfunction of thoracic region: Secondary | ICD-10-CM | POA: Diagnosis not present

## 2020-11-19 DIAGNOSIS — M5134 Other intervertebral disc degeneration, thoracic region: Secondary | ICD-10-CM | POA: Diagnosis not present

## 2020-11-20 ENCOUNTER — Encounter: Payer: Self-pay | Admitting: Neurology

## 2020-11-20 ENCOUNTER — Telehealth: Payer: Self-pay | Admitting: *Deleted

## 2020-11-20 NOTE — Telephone Encounter (Signed)
Sent a referral to [facility] neurology today and the fax number is [phone]. Lattie Haw

## 2020-11-20 NOTE — Telephone Encounter (Signed)
-----   Message from Trula Slade, DPM sent at 11/17/2020  7:35 PM EDT ----- Valerie Snyder I have ordered a nerve conduction test.  Can you please follow-up to see if they can schedule this?  Thank you.  I have sent the patient a MyChart message with the results of her MRI.

## 2020-11-25 ENCOUNTER — Telehealth: Payer: Self-pay | Admitting: *Deleted

## 2020-11-25 NOTE — Telephone Encounter (Signed)
I looked at the referral for nerve conduction study and Starla left a message for the patient to call and get scheduled on 11/20/2020. Lattie Haw

## 2020-11-25 NOTE — Telephone Encounter (Signed)
-----   Message from Trula Slade, DPM sent at 11/17/2020  7:35 PM EDT ----- Lattie Haw I have ordered a nerve conduction test.  Can you please follow-up to see if they can schedule this?  Thank you.  I have sent the patient a MyChart message with the results of her MRI.

## 2020-11-30 ENCOUNTER — Ambulatory Visit: Payer: Medicare Other | Admitting: Podiatry

## 2020-12-02 ENCOUNTER — Other Ambulatory Visit: Payer: Self-pay | Admitting: *Deleted

## 2020-12-02 DIAGNOSIS — C50211 Malignant neoplasm of 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 breast: Secondary | ICD-10-CM

## 2020-12-02 DIAGNOSIS — Z170 Estrogen receptor positive status [ER+]: Secondary | ICD-10-CM

## 2020-12-03 ENCOUNTER — Inpatient Hospital Stay: Payer: Medicare Other | Attending: Hematology and Oncology

## 2020-12-03 ENCOUNTER — Ambulatory Visit (HOSPITAL_COMMUNITY)
Admission: RE | Admit: 2020-12-03 | Discharge: 2020-12-03 | Disposition: A | Discharge status: Home / Self Care | Payer: Medicare Other | Source: Ambulatory Visit | Attending: Hematology and Oncology | Admitting: Hematology and Oncology

## 2020-12-03 ENCOUNTER — Other Ambulatory Visit: Payer: Self-pay

## 2020-12-03 DIAGNOSIS — Z923 Personal history of irradiation: Secondary | ICD-10-CM | POA: Insufficient documentation

## 2020-12-03 DIAGNOSIS — Z79899 Other long term (current) drug therapy: Secondary | ICD-10-CM | POA: Insufficient documentation

## 2020-12-03 DIAGNOSIS — I700 Atherosclerosis of aorta: Secondary | ICD-10-CM | POA: Diagnosis not present

## 2020-12-03 DIAGNOSIS — Z170 Estrogen receptor positive status [ER+]: Secondary | ICD-10-CM

## 2020-12-03 DIAGNOSIS — C50211 Malignant neoplasm of 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 breast: Secondary | ICD-10-CM | POA: Insufficient documentation

## 2020-12-03 DIAGNOSIS — Z79810 Long term (current) use of selective estrogen receptor modulators (SERMs): Secondary | ICD-10-CM | POA: Insufficient documentation

## 2020-12-03 LAB — CBC WITH DIFFERENTIAL (CANCER CENTER ONLY)
Abs Immature Granulocytes: 0.01 10*3/uL (ref 0.00–0.07)
Basophils Absolute: 0 10*3/uL (ref 0.0–0.1)
Basophils Relative: 1 %
Eosinophils Absolute: 0.1 10*3/uL (ref 0.0–0.5)
Eosinophils Relative: 2 %
HCT: 39.2 % (ref 36.0–46.0)
Hemoglobin: 13.1 g/dL (ref 12.0–15.0)
Immature Granulocytes: 0 %
Lymphocytes Relative: 29 %
Lymphs Abs: 1.4 10*3/uL (ref 0.7–4.0)
MCH: 31.3 pg (ref 26.0–34.0)
MCHC: 33.4 g/dL (ref 30.0–36.0)
MCV: 93.8 fL (ref 80.0–100.0)
Monocytes Absolute: 0.4 10*3/uL (ref 0.1–1.0)
Monocytes Relative: 9 %
Neutro Abs: 2.9 10*3/uL (ref 1.7–7.7)
Neutrophils Relative %: 59 %
Platelet Count: 181 10*3/uL (ref 150–400)
RBC: 4.18 MIL/uL (ref 3.87–5.11)
RDW: 12.3 % (ref 11.5–15.5)
WBC Count: 4.9 10*3/uL (ref 4.0–10.5)
nRBC: 0 % (ref 0.0–0.2)

## 2020-12-03 LAB — CMP (CANCER CENTER ONLY)
ALT: 30 U/L (ref 0–44)
AST: 26 U/L (ref 15–41)
Albumin: 4 g/dL (ref 3.5–5.0)
Alkaline Phosphatase: 58 U/L (ref 38–126)
Anion gap: 8 (ref 5–15)
BUN: 14 mg/dL (ref 8–23)
CO2: 27 mmol/L (ref 22–32)
Calcium: 9.3 mg/dL (ref 8.9–10.3)
Chloride: 106 mmol/L (ref 98–111)
Creatinine: 0.75 mg/dL (ref 0.44–1.00)
GFR, Estimated: >60 mL/min (ref >60)
Glucose, Bld: 92 mg/dL (ref 70–99)
Potassium: 4.3 mmol/L (ref 3.5–5.1)
Sodium: 141 mmol/L (ref 135–145)
Total Bilirubin: 0.5 mg/dL (ref 0.3–1.2)
Total Protein: 6.9 g/dL (ref 6.5–8.1)

## 2020-12-03 MED ORDER — IOHEXOL 350 MG/ML SOLN
60.0000 mL | Freq: Once | INTRAVENOUS | Status: AC | PRN
  Administered 2020-12-03: 60 mL via INTRAVENOUS

## 2020-12-07 ENCOUNTER — Ambulatory Visit: Payer: Medicare Other
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
## 2020-12-08 ENCOUNTER — Other Ambulatory Visit: Payer: Self-pay

## 2020-12-08 ENCOUNTER — Inpatient Hospital Stay (HOSPITAL_BASED_OUTPATIENT_CLINIC_OR_DEPARTMENT_OTHER): Payer: Medicare Other | Admitting: Hematology and Oncology

## 2020-12-08 ENCOUNTER — Ambulatory Visit
Admission: RE | Admit: 2020-12-08 | Discharge: 2020-12-08 | Disposition: A | Discharge status: Home / Self Care | Payer: Medicare Other | Source: Ambulatory Visit | Attending: Hematology and Oncology | Admitting: Hematology and Oncology

## 2020-12-08 DIAGNOSIS — C50211 Malignant neoplasm of 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 breast: Secondary | ICD-10-CM | POA: Diagnosis not present

## 2020-12-08 DIAGNOSIS — Z1231 Encounter for screening mammogram for malignant neoplasm of breast: Secondary | ICD-10-CM | POA: Diagnosis not present

## 2020-12-08 DIAGNOSIS — Z170 Estrogen receptor positive status [ER+]: Secondary | ICD-10-CM

## 2020-12-08 DIAGNOSIS — Z923 Personal history of irradiation: Secondary | ICD-10-CM | POA: Diagnosis not present

## 2020-12-08 DIAGNOSIS — Z79810 Long term (current) use of selective estrogen receptor modulators (SERMs): Secondary | ICD-10-CM | POA: Diagnosis not present

## 2020-12-08 DIAGNOSIS — Z79899 Other long term (current) drug therapy: Secondary | ICD-10-CM | POA: Diagnosis not present

## 2020-12-08 MED ORDER — COQ10 100 MG PO CAPS: 300.0000 mg | ORAL_CAPSULE | Freq: Every day | ORAL | Status: DC

## 2020-12-08 MED ORDER — OMEGA-3 FISH OIL 1200 MG PO CAPS
1000.0000 mg | ORAL_CAPSULE | Freq: Every day | ORAL | Status: DC
Start: 2020-12-08 — End: 2021-09-09

## 2020-12-08 MED ORDER — MAGNESIUM 250 MG PO TABS: 1.0000 | ORAL_TABLET | ORAL | 0 refills | Status: DC
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
## 2020-12-10 DIAGNOSIS — Z20822 Contact with and (suspected) exposure to covid-19: Secondary | ICD-10-CM | POA: Diagnosis not present

## 2020-12-17 ENCOUNTER — Ambulatory Visit (INDEPENDENT_AMBULATORY_CARE_PROVIDER_SITE_OTHER): Payer: Medicare Other | Admitting: Neurology

## 2020-12-17 ENCOUNTER — Other Ambulatory Visit: Payer: Self-pay

## 2020-12-17 DIAGNOSIS — M5134 Other intervertebral disc degeneration, thoracic region: Secondary | ICD-10-CM | POA: Diagnosis not present

## 2020-12-17 DIAGNOSIS — M9902 Segmental and somatic dysfunction of thoracic region: Secondary | ICD-10-CM | POA: Diagnosis not present

## 2020-12-17 DIAGNOSIS — G5752 Tarsal tunnel syndrome, left lower limb: Secondary | ICD-10-CM

## 2020-12-17 DIAGNOSIS — M9901 Segmental and somatic dysfunction of cervical region: Secondary | ICD-10-CM | POA: Diagnosis not present

## 2020-12-17 DIAGNOSIS — M47812 Spondylosis without myelopathy or radiculopathy, cervical region: Secondary | ICD-10-CM | POA: Diagnosis not present

## 2020-12-17 DIAGNOSIS — M79672 Pain in left foot: Secondary | ICD-10-CM

## 2020-12-17 NOTE — Procedures (Signed)
[facility]  [address]  [place], [place] [address] Tel: [phone] Fax:  [phone] Test Date:  12/17/2020  Patient: Valerie Snyder DOB: 1950/04/23 Physician: Narda Amber, DO  Sex: Female Height: 5\' 5"  Ref Phys: Celesta Gentile, DPM  ID#: [id number]   Technician:    Patient Complaints: Is a 70 year old female referred for evaluation of left heel pain.  NCV & EMG Findings: Extensive electrodiagnostic testing of the left lower extremity and additional studies of the right shows:  Left sural and superficial peroneal sensory responses are within normal limits.  Mixed plantar sensory responses are absent bilaterally; these findings are normal for patient's age. Left peroneal and tibial motor responses are within normal limits. Left tibial H reflex study is within normal limits. There is no evidence of active or chronic motor axonal loss changes affecting any of the tested muscles.  Motor unit configuration and recruitment pattern is within normal limits.  Impression: This is a normal study of the left lower extremity.  In particular, there is no evidence of tarsal tunnel syndrome, sensorimotor polyneuropathy, or a lumbosacral radiculopathy.   ___________________________ Narda Amber, DO    Nerve Conduction Studies Anti Sensory Summary Table   Stim Site NR Peak (ms) Norm Peak (ms) P-T Amp (V) Norm P-T Amp  Left Sup Peroneal Anti Sensory (Ant Lat Mall)  12 cm    3.4 <4.6 6.0 >3  Left Sural Anti Sensory (Lat Mall)  Calf    3.3 <4.6 7.0 >3   Motor Summary Table   Stim Site NR Onset (ms) Norm Onset (ms) O-P Amp (mV) Norm O-P Amp Site1 Site2 Delta-0 (ms) Dist (cm) Vel (m/s) Norm Vel (m/s)  Left Peroneal Motor (Ext Dig Brev)  Ankle    3.3 <6.0 9.4 >2.5 B Fib Ankle 7.1 37.0 52 >40  B Fib    10.4  8.9  Poplt B Fib 1.5 8.0 53 >40  Poplt    11.9  8.8         Left Tibial Motor (Abd Hall Brev)  Ankle    3.0 <6.0 4.3 >4 Knee Ankle 8.6 40.0 47 >40   Knee    11.6  2.6         Left Tibial (ADQP) Motor (ADQP)  Ankle    4.4 <6.5 3.1 >3         Mixed Summary Table   Stim Site NR Peak (ms) Norm Peak (ms) P-T Amp (V) Norm P-T Amp  Left Lateral Plantar Mixed (Med Malleolus)  Lateral Foot NR  <3.7  >8  Right Lateral Plantar Mixed (Med Malleolus)  Lateral Foot NR  <3.7  >8  Left Medial Plantar Mixed (Med Malleolus)  Medial Foot NR  <3.7  >8  Right Medial Plantar Mixed (Med Malleolus)  Medial Foot NR  <3.7  >8   H Reflex Studies   NR H-Lat (ms) Lat Norm (ms) L-R H-Lat (ms)  Left Tibial (Gastroc)     32.79 <35    EMG   Side Muscle Ins Act Fibs Psw Fasc Number Recrt Dur Dur. Amp Amp. Poly Poly. Comment  Left AntTibialis Nml Nml Nml Nml Nml Nml Nml Nml Nml Nml Nml Nml N/A  Left Gastroc Nml Nml Nml Nml Nml Nml Nml Nml Nml Nml Nml Nml N/A  Left Flex Dig Long Nml Nml Nml Nml Nml Nml Nml Nml Nml Nml Nml Nml N/A  Left RectFemoris Nml Nml Nml Nml Nml Nml Nml Nml Nml Nml Nml Nml N/A  Left AbdHallucis Nml Nml Nml Nml Nml Nml Nml Nml Nml Nml Nml Nml N/A      Waveforms:

## 2020-12-18 ENCOUNTER — Ambulatory Visit: Payer: Medicare Other

## 2020-12-18 ENCOUNTER — Ambulatory Visit (INDEPENDENT_AMBULATORY_CARE_PROVIDER_SITE_OTHER): Payer: Medicare Other | Admitting: Podiatry

## 2020-12-18 DIAGNOSIS — I2510 Atherosclerotic heart disease of native coronary artery without angina pectoris: Secondary | ICD-10-CM | POA: Diagnosis not present

## 2020-12-18 DIAGNOSIS — M722 Plantar fascial fibromatosis: Secondary | ICD-10-CM

## 2020-12-18 DIAGNOSIS — G5752 Tarsal tunnel syndrome, left lower limb: Secondary | ICD-10-CM

## 2020-12-18 NOTE — Progress Notes (Signed)
SITUATION Reason for Consult: Follow-up with custom foot orthoses Patient / Caregiver Report: Patient received these foot orthoses from another facility one year ago and reports that they were unable to prevent her foot pain.  OBJECTIVE DATA History / Diagnosis: Plantar Fasciitis Change in Pathology: None  Device Status: Nonfunctional  ACTIONS PERFORMED Patient's equipment was checked for structural stability and fit. Per Dr. Jacqualyn Posey, modified patient's existing left orthosis with combination metatarsal/scaphoid pad. Patient reports reduction in pain and is satisfied with fit and function. Device(s) intact and fit is excellent. All questions answered and concerns addressed.  PLAN Follow-up as needed (PRN). Plan of care discussed with and agreed upon by patient / caregiver.

## 2020-12-21 NOTE — Progress Notes (Signed)
Subjective: 70 year old female presents the office today for follow-up evaluation of MRI as well as chronic left foot pain.  She is also had a nerve conduction test which came back within normal limits as well.  She continues to get discomfort to her left foot and she was discussed other treatment options.  She still showing discomfort but also shoots a burning sensation.  Objective: AAO x3, NAD DP/PT pulses palpable bilaterally, CRT less than 3 seconds There is still tenderness today still on the plantar aspect calcaneus insertion plantar fascia mildly within the arch of the foot as well.  She still describing some burning sensation to her feet as well. No pain with calf compression, swelling, warmth, erythema  Assessment: Left chronic foot pain  Plan: -All treatment options discussed with the patient including all alternatives, risks, complications.  -Reviewed the nerve conduction test as well as the MRI.  Still concern for underlying nerve issues.  Whenever, and working out do a nerve biopsy to rule out any neurological issues.  If within normal limits we will need to further discuss other treatment options including EPAT, PRP injection.  Also if still concern for tarsal tunnel may need to consider further treatment for that as well. -Patient encouraged to call the office with any questions, concerns, change in symptoms.   Trula Slade DPM

## 2020-12-22 ENCOUNTER — Other Ambulatory Visit (HOSPITAL_BASED_OUTPATIENT_CLINIC_OR_DEPARTMENT_OTHER): Payer: Self-pay | Admitting: Cardiovascular Disease

## 2020-12-22 DIAGNOSIS — D123 Benign neoplasm of transverse colon: Secondary | ICD-10-CM | POA: Diagnosis not present

## 2020-12-22 DIAGNOSIS — K317 Polyp of stomach and duodenum: Secondary | ICD-10-CM | POA: Diagnosis not present

## 2020-12-22 DIAGNOSIS — E7800 Pure hypercholesterolemia, unspecified: Secondary | ICD-10-CM

## 2020-12-22 DIAGNOSIS — R1310 Dysphagia, unspecified: Secondary | ICD-10-CM | POA: Diagnosis not present

## 2020-12-22 DIAGNOSIS — Z86010 Personal history of colonic polyps: Secondary | ICD-10-CM | POA: Diagnosis not present

## 2020-12-22 DIAGNOSIS — D120 Benign neoplasm of cecum: Secondary | ICD-10-CM | POA: Diagnosis not present

## 2020-12-22 DIAGNOSIS — K5730 Diverticulosis of large intestine without perforation or abscess without bleeding: Secondary | ICD-10-CM | POA: Diagnosis not present

## 2020-12-22 DIAGNOSIS — K2100 Gastro-esophageal reflux disease with esophagitis, without bleeding: Secondary | ICD-10-CM | POA: Diagnosis not present

## 2020-12-22 NOTE — Telephone Encounter (Signed)
Rx request sent to pharmacy.  

## 2020-12-31 DIAGNOSIS — D120 Benign neoplasm of cecum: Secondary | ICD-10-CM | POA: Diagnosis not present

## 2020-12-31 DIAGNOSIS — K317 Polyp of stomach and duodenum: Secondary | ICD-10-CM | POA: Diagnosis not present

## 2020-12-31 DIAGNOSIS — D123 Benign neoplasm of transverse colon: Secondary | ICD-10-CM | POA: Diagnosis not present

## 2021-01-01 ENCOUNTER — Other Ambulatory Visit: Payer: Self-pay

## 2021-01-01 ENCOUNTER — Ambulatory Visit (INDEPENDENT_AMBULATORY_CARE_PROVIDER_SITE_OTHER): Payer: Medicare Other | Admitting: Podiatry

## 2021-01-01 DIAGNOSIS — G608 Other hereditary and idiopathic neuropathies: Secondary | ICD-10-CM

## 2021-01-01 DIAGNOSIS — G609 Hereditary and idiopathic neuropathy, unspecified: Secondary | ICD-10-CM | POA: Diagnosis not present

## 2021-01-01 DIAGNOSIS — R2990 Unspecified symptoms and signs involving the nervous system: Secondary | ICD-10-CM | POA: Diagnosis not present

## 2021-01-04 DIAGNOSIS — K219 Gastro-esophageal reflux disease without esophagitis: Secondary | ICD-10-CM | POA: Diagnosis not present

## 2021-01-04 DIAGNOSIS — E039 Hypothyroidism, unspecified: Secondary | ICD-10-CM | POA: Diagnosis not present

## 2021-01-04 DIAGNOSIS — E7800 Pure hypercholesterolemia, unspecified: Secondary | ICD-10-CM | POA: Diagnosis not present

## 2021-01-04 DIAGNOSIS — M19049 Primary osteoarthritis, unspecified hand: Secondary | ICD-10-CM | POA: Diagnosis not present

## 2021-01-04 DIAGNOSIS — J45909 Unspecified asthma, uncomplicated: Secondary | ICD-10-CM | POA: Diagnosis not present

## 2021-01-04 DIAGNOSIS — G43109 Migraine with aura, not intractable, without status migrainosus: Secondary | ICD-10-CM | POA: Diagnosis not present

## 2021-01-04 DIAGNOSIS — J4520 Mild intermittent asthma, uncomplicated: Secondary | ICD-10-CM | POA: Diagnosis not present

## 2021-01-04 NOTE — Progress Notes (Signed)
Subjective: 70 year old female presents the office today for neuro biopsy.  Discussed at the last appointment she wishes to proceed with this before pursuing any further treatment for Planter fasciitis given her symptoms.  Nerve conduction test was normal, except for small fiber neuropathy.  She wishes to proceed with nerve biopsy today.  Otherwise she states that she has been doing well with any significant changes.  Objective: AAO x3, NAD DP/PT pulses palpable bilaterally, CRT less than 3 seconds There is still tenderness today still on the plantar aspect calcaneus insertion plantar fascia mildly within the arch of the foot as well.  She still describing some burning sensation to her feet as well.  There is a negative Tinel sign. No pain with calf compression, swelling, warmth, erythema  Assessment: Left chronic foot pain, rule out small fiber disease  Plan: -All treatment options discussed with the patient including all alternatives, risks, complications.  -Today we again discussed nail biopsy.  We discussed the procedure as well as postoperative course.  Alternatives risks and complications were discussed.  She agrees and consent was signed.  I anesthetized the skin and after cleaning with alcohol with 1 mL of lidocaine with epinephrine bilaterally just proximal to the area of the biopsy.  At this time the skin was cleaned with Betadine, alcohol and approximate 10 cm proximal to the lateral malleolus punch biopsy was performed bilaterally.  This was then sent in process under standard technique and sent to [facility] labs.  I irrigated the wounds and small amount of Silvadene was applied followed by dressing.  She tolerated procedure well and complications.  Post procedure instructions discussed. -Monitor for any clinical signs or symptoms of infection and directed to call the office immediately should any occur or go to the ER.  Return in about 2 weeks (around 01/15/2021) for nerve biopsy check  .  Valerie Snyder DPM

## 2021-01-05 DIAGNOSIS — Z79899 Other long term (current) drug therapy: Secondary | ICD-10-CM | POA: Diagnosis not present

## 2021-01-05 DIAGNOSIS — M154 Erosive (osteo)arthritis: Secondary | ICD-10-CM | POA: Diagnosis not present

## 2021-01-07 DIAGNOSIS — R3121 Asymptomatic microscopic hematuria: Secondary | ICD-10-CM | POA: Diagnosis not present

## 2021-01-08 ENCOUNTER — Telehealth: Payer: Self-pay | Admitting: Podiatry

## 2021-01-08 NOTE — Telephone Encounter (Signed)
I called the patient and her husband was on speaker phone to go over the nerve biopsy results.  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  We discussed different treatment options.  We will start alpha lipoic acid, benfotiamine.  We will also likely order blood work to check vitamin levels I see her back on Friday.  We discussed other treatment options with her foot pain including EPAT.  Discussed stopping her anti-inflammatories but she has not discussed with her rheumatologist prior to stopping this.  She has an appointment scheduled for next week.

## 2021-01-12 DIAGNOSIS — Z20822 Contact with and (suspected) exposure to covid-19: Secondary | ICD-10-CM | POA: Diagnosis not present

## 2021-01-14 DIAGNOSIS — Z01419 Encounter for gynecological examination (general) (routine) without abnormal findings: Secondary | ICD-10-CM | POA: Diagnosis not present

## 2021-01-14 DIAGNOSIS — M9902 Segmental and somatic dysfunction of thoracic region: Secondary | ICD-10-CM | POA: Diagnosis not present

## 2021-01-14 DIAGNOSIS — N941 Unspecified dyspareunia: Secondary | ICD-10-CM | POA: Diagnosis not present

## 2021-01-14 DIAGNOSIS — N952 Postmenopausal atrophic vaginitis: Secondary | ICD-10-CM | POA: Diagnosis not present

## 2021-01-14 DIAGNOSIS — M9901 Segmental and somatic dysfunction of cervical region: Secondary | ICD-10-CM | POA: Diagnosis not present

## 2021-01-14 DIAGNOSIS — M5134 Other intervertebral disc degeneration, thoracic region: Secondary | ICD-10-CM | POA: Diagnosis not present

## 2021-01-14 DIAGNOSIS — M47812 Spondylosis without myelopathy or radiculopathy, cervical region: Secondary | ICD-10-CM | POA: Diagnosis not present

## 2021-01-15 ENCOUNTER — Ambulatory Visit (INDEPENDENT_AMBULATORY_CARE_PROVIDER_SITE_OTHER): Payer: Medicare Other | Admitting: Podiatry

## 2021-01-15 ENCOUNTER — Other Ambulatory Visit: Payer: Self-pay

## 2021-01-15 DIAGNOSIS — M722 Plantar fascial fibromatosis: Secondary | ICD-10-CM

## 2021-01-15 DIAGNOSIS — G629 Polyneuropathy, unspecified: Secondary | ICD-10-CM | POA: Diagnosis not present

## 2021-01-15 DIAGNOSIS — E538 Deficiency of other specified B group vitamins: Secondary | ICD-10-CM

## 2021-01-15 DIAGNOSIS — I2510 Atherosclerotic heart disease of native coronary artery without angina pectoris: Secondary | ICD-10-CM | POA: Diagnosis not present

## 2021-01-15 DIAGNOSIS — E531 Pyridoxine deficiency: Secondary | ICD-10-CM

## 2021-01-18 ENCOUNTER — Other Ambulatory Visit: Payer: Self-pay

## 2021-01-18 ENCOUNTER — Encounter (HOSPITAL_BASED_OUTPATIENT_CLINIC_OR_DEPARTMENT_OTHER): Payer: Self-pay | Admitting: Emergency Medicine

## 2021-01-18 ENCOUNTER — Emergency Department (HOSPITAL_BASED_OUTPATIENT_CLINIC_OR_DEPARTMENT_OTHER)
Admission: EM | Admit: 2021-01-18 | Discharge: 2021-01-18 | Disposition: A | Discharge status: Home / Self Care | Payer: Medicare Other | Attending: Emergency Medicine | Admitting: Emergency Medicine

## 2021-01-18 DIAGNOSIS — Z853 Personal history of malignant neoplasm of breast: Secondary | ICD-10-CM | POA: Insufficient documentation

## 2021-01-18 DIAGNOSIS — Z85828 Personal history of other malignant neoplasm of skin: Secondary | ICD-10-CM | POA: Insufficient documentation

## 2021-01-18 DIAGNOSIS — E039 Hypothyroidism, unspecified: Secondary | ICD-10-CM | POA: Diagnosis not present

## 2021-01-18 DIAGNOSIS — I2510 Atherosclerotic heart disease of native coronary artery without angina pectoris: Secondary | ICD-10-CM | POA: Diagnosis not present

## 2021-01-18 DIAGNOSIS — Z79899 Other long term (current) drug therapy: Secondary | ICD-10-CM | POA: Diagnosis not present

## 2021-01-18 DIAGNOSIS — M545 Low back pain, unspecified: Secondary | ICD-10-CM | POA: Insufficient documentation

## 2021-01-18 DIAGNOSIS — J45909 Unspecified asthma, uncomplicated: Secondary | ICD-10-CM | POA: Insufficient documentation

## 2021-01-18 DIAGNOSIS — M5459 Other low back pain: Secondary | ICD-10-CM | POA: Diagnosis not present

## 2021-01-18 DIAGNOSIS — M549 Dorsalgia, unspecified: Secondary | ICD-10-CM | POA: Diagnosis not present

## 2021-01-18 MED ORDER — CELECOXIB 200 MG PO CAPS: 200.0000 mg | ORAL_CAPSULE | Freq: Once | ORAL | Status: DC

## 2021-01-18 MED ORDER — DIAZEPAM 5 MG/ML IJ SOLN
2.5000 mg | Freq: Once | INTRAMUSCULAR | Status: AC
  Administered 2021-01-18: 06:00:00 2.5 mg via INTRAVENOUS
  Filled 2021-01-18: qty 2

## 2021-01-18 MED ORDER — DEXAMETHASONE SODIUM PHOSPHATE 10 MG/ML IJ SOLN
10.0000 mg | Freq: Once | INTRAMUSCULAR | Status: AC
  Administered 2021-01-18: 06:00:00 10 mg via INTRAVENOUS
  Filled 2021-01-18: qty 1

## 2021-01-18 MED ORDER — OXYCODONE HCL 5 MG PO TABS: 5.0000 mg | ORAL_TABLET | Freq: Four times a day (QID) | ORAL | 0 refills | Status: DC | PRN

## 2021-01-18 MED ORDER — ACETAMINOPHEN 500 MG PO TABS
1000.0000 mg | ORAL_TABLET | Freq: Once | ORAL | Status: AC
  Administered 2021-01-18: 06:00:00 1000 mg via ORAL
  Filled 2021-01-18: qty 2

## 2021-01-18 MED ORDER — OXYCODONE HCL 5 MG PO TABS
5.0000 mg | ORAL_TABLET | Freq: Once | ORAL | Status: AC
  Administered 2021-01-18: 07:00:00 5 mg via ORAL
  Filled 2021-01-18: qty 1

## 2021-01-18 MED ORDER — LIDOCAINE 5 % EX PTCH
1.0000 | MEDICATED_PATCH | CUTANEOUS | Status: DC
  Administered 2021-01-18: 06:00:00 1 via TRANSDERMAL
  Filled 2021-01-18: qty 1

## 2021-01-18 MED ORDER — METHYLPREDNISOLONE 4 MG PO TBPK: ORAL_TABLET | ORAL | 0 refills | Status: DC

## 2021-01-18 MED ORDER — TIZANIDINE HCL 2 MG PO TABS: 4.0000 mg | ORAL_TABLET | Freq: Three times a day (TID) | ORAL | 0 refills | Status: DC | PRN

## 2021-01-18 NOTE — Discharge Instructions (Signed)
I have prescribed you a muscle relaxant tizanidine.  Please take as prescribed.  I have also written you for narcotic pain medicine Roxicodone.  Both of these medications are sedating so please be careful when taking them and do not do any dangerous activities including driving.  Recommend taking her Celebrex.  Recommend taking Tylenol 1000 mg every 6 hours.  Recommend buying over-the-counter lidocaine patches to use as well.  I have also prescribed you a Medrol Dosepak steroid, please follow package insert.  Follow-up with your primary care doctor.

## 2021-01-18 NOTE — ED Provider Notes (Signed)
[facility] Provider Note   CSN: [id number] Arrival date & time: 01/18/21  0531     History Chief Complaint  Patient presents with   Back Pain    Valerie Snyder is a 70 y.o. female.  The history is provided by the patient.  Back Pain Location:  Lumbar spine Quality:  Aching Radiates to:  Does not radiate Pain severity:  Moderate Onset quality:  Gradual Duration:  12 hours Timing:  Intermittent Progression:  Waxing and waning Context: not recent illness   Relieved by:  Nothing Worsened by:  Palpation and movement Associated symptoms: no abdominal pain, no abdominal swelling, no bladder incontinence, no bowel incontinence, no chest pain, no dysuria, no fever, no headaches, no leg pain, no numbness, no paresthesias, no pelvic pain, no perianal numbness, no tingling, no weakness and no weight loss       Past Medical History:  Diagnosis Date   Abnormal uterine bleeding (AUB)    Allergy    Asthma    allergy related   Breast cancer of 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 breast ([place]) 11/13/2014   CAD in native artery 01/15/2019   Asymptomatic coronary calcification on coronary calcium score.  72nd percentile 11/2018.   Complication of anesthesia    10 years ago- pt states she stopped breathing and "had to be bagged"   Concussion 2018   GERD (gastroesophageal reflux disease)    H/O bilateral inguinal hernia repair 1992   Hypercholesteremia    Hypothyroidism    Migraines    Personal history of radiation therapy    Plantar fasciitis, bilateral    Pure hypercholesterolemia 10/10/2018   Radiation 01/12/15-02/04/15   right breast 42.17 gray   Spinal stenosis    Thyroid disease     Patient Active Problem List   Diagnosis Date Noted   CAD in native artery 01/15/2019   Pure hypercholesterolemia 10/10/2018   Genetic testing 12/10/2014   Breast cancer of 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 breast ([place]) 11/13/2014   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   Asthma 10/22/2014   History of basal cell cancer 03/21/2014   Visit for wound check 07/29/2013   Visit for suture removal 07/11/2013   Basal cell carcinoma of nasal tip 07/04/2013   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   Neoplasm of uncertain behavior of skin 06/19/2013   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   Lentigo 12/02/2011   Melanocytic nevi of trunk 12/02/2011   SK (seborrheic keratosis) 12/02/2011   BCC (basal cell carcinoma) 01/29/1898    Past Surgical History:  Procedure Laterality Date   BLADDER SUSPENSION     BLEPHAROPLASTY Bilateral    BREAST LUMPECTOMY Right 12/10/2014   BREAST LUMPECTOMY WITH RADIOACTIVE SEED LOCALIZATION Right 12/10/2014   Procedure: RIGHT BREAST LUMPECTOMY WITH RADIOACTIVE SEED LOCALIZATION;  Surgeon: Autumn Messing III, MD;  Location: [facility];  Service: General;  Laterality: Right;   DILATATION & CURETTAGE/HYSTEROSCOPY WITH MYOSURE N/A 07/15/2020   Procedure: DILATATION & CURETTAGE/HYSTEROSCOPY WITHPOLYPECTOMY USING MYOSURE;  Surgeon: Christophe Louis, MD;  Location: [facility];  Service: Gynecology;  Laterality: N/A;   FOOT SURGERY     plantar fashiectomies from both feet   HERNIA REPAIR     HYSTEROSCOPY WITH D & C N/A 12/27/2017   Procedure: DILATATION AND CURETTAGE /HYSTEROSCOPY;  Surgeon: Janyth Pupa, DO;  Location: [facility];  Service: Gynecology;  Laterality: N/A;   NASAL SEPTUM SURGERY     neuromas removed from right foot  osteoma  1991   removal from right orbital   POLYPECTOMY  12/27/2017   Procedure: POLYPECTOMY;  Surgeon: Janyth Pupa, DO;  Location: [facility];  Service: Gynecology;;   removal of basal cell carcinoma (nose)     TONSILECTOMY/ADENOIDECTOMY WITH MYRINGOTOMY     TONSILLECTOMY       OB History   No obstetric history on file.     Family History  Problem Relation Age of Onset   Pancreatic cancer Maternal Aunt        dx. 40s-early 64s    Congestive Heart Failure Mother 60   Lung disease Mother    Colon polyps Mother        unspecified number   Dementia Father        Lewy Body Dementia   Kidney failure Father    Stroke Father    Depression Father    Multiple sclerosis Sister    Depression Sister    Aneurysm Maternal Grandmother        brain   Heart attack Maternal Grandfather    Emphysema Paternal Grandfather    Heart Problems Paternal Grandfather    Other Sister        3 sisters have history of cysts in uterus   Depression Sister    Breast cancer Cousin        dx. 21-50   Testicular cancer Cousin        dx. 89s   Colon polyps Daughter        unspecified number    Social History   Tobacco Use   Smoking status: Never   Smokeless tobacco: Never  Vaping Use   Vaping Use: Never used  Substance Use Topics   Alcohol use: No   Drug use: No    Home Medications Prior to Admission medications   Medication Sig Start Date End Date Taking? Authorizing Provider  methylPREDNISolone (MEDROL DOSEPAK) 4 MG TBPK tablet Follow package insert 01/18/21  Yes Nicholl Onstott, DO  oxyCODONE (ROXICODONE) 5 MG immediate release tablet Take 1 tablet (5 mg total) by mouth every 6 (six) hours as needed for up to 10 doses for breakthrough pain. 01/18/21  Yes Jeriah Corkum, DO  tiZANidine (ZANAFLEX) 2 MG tablet Take 2 tablets (4 mg total) by mouth every 8 (eight) hours as needed for up to 20 doses for muscle spasms. 01/18/21  Yes Alyaan Budzynski, DO  albuterol (VENTOLIN HFA) 108 (90 Base) MCG/ACT inhaler Inhale 2 puffs into the lungs every 4-6 hours as needed for cough, wheeze, shortness of breath or cough. 01/29/19   Kozlow, Donnamarie Poag, MD  celecoxib (CELEBREX) 200 MG capsule Take 200 mg by mouth daily.    [provider]  cetirizine (ZYRTEC) 10 MG tablet Take 10 mg by mouth daily as needed for allergies.     [provider]  Coenzyme Q10 (COQ10) 100 MG CAPS Take 300 mg by mouth daily. 12/08/20   Nicholas Lose, MD   Cranberry 450 MG CAPS Take 900 mg by mouth daily.     [provider]  EPINEPHrine (EPIPEN 2-PAK) 0.3 mg/0.3 mL IJ SOAJ injection Inject 0.3 mLs (0.3 mg total) into the muscle once as needed. Reported on 01/20/2015 07/28/17   Jiles Prows, MD  ezetimibe (ZETIA) 10 MG tablet Take 1 tablet (10 mg total) by mouth daily. Please keep your upcoming appointment with Dr. Oval Linsey for refills. 12/22/20   Skeet Latch, MD  hydroxychloroquine (PLAQUENIL) 200 MG tablet in the morning and at  bedtime. 11/27/19   [provider]  levothyroxine (SYNTHROID) 75 MCG tablet  12/20/19   [provider]  Magnesium 250 MG TABS Take 1 tablet (250 mg total) by mouth every other day. 12/08/20   Nicholas Lose, MD  Misc Natural Products (OSTEO BI-FLEX ADV TRIPLE ST PO) Take 2 tablets by mouth daily.    [provider]  Multiple Vitamins-Minerals (CENTRUM SILVER ADULT 50+ PO) Take 1 tablet by mouth daily.     [provider]  Omega-3 Fatty Acids (OMEGA-3 FISH OIL) 1200 MG CAPS Take 0.8333 capsules (1,000 mg total) by mouth daily. 12/08/20   Nicholas Lose, MD  pantoprazole (PROTONIX) 20 MG tablet Take 1 tablet (20 mg total) by mouth daily. 04/05/18   Nicholas Lose, MD  pravastatin (PRAVACHOL) 20 MG tablet Take 1 tablet (20 mg total) by mouth daily. 01/16/20   Skeet Latch, MD  Probiotic Product (SOLUBLE FIBER/PROBIOTICS PO) Take 1 capsule by mouth daily. 20 billion    [provider]  vitamin C (ASCORBIC ACID) 250 MG tablet Take 2 tablets (500 mg total) by mouth daily. 04/11/19   Nicholas Lose, MD  zinc gluconate 50 MG tablet Take 1 tablet (50 mg total) by mouth daily. 04/11/19   Nicholas Lose, MD    Allergies    Bee venom, Keflex [cephalexin], and Toradol [ketorolac tromethamine]  Review of Systems   Review of Systems  Constitutional:  Negative for chills, fever and weight loss.  HENT:  Negative for ear pain and sore throat.   Eyes:  Negative for pain and visual  disturbance.  Respiratory:  Negative for cough and shortness of breath.   Cardiovascular:  Negative for chest pain and palpitations.  Gastrointestinal:  Negative for abdominal pain, bowel incontinence and vomiting.  Genitourinary:  Negative for bladder incontinence, dysuria, hematuria and pelvic pain.  Musculoskeletal:  Positive for back pain and gait problem. Negative for arthralgias.  Skin:  Negative for color change and rash.  Neurological:  Negative for tingling, seizures, syncope, weakness, numbness, headaches and paresthesias.  All other systems reviewed and are negative.  Physical Exam Updated Vital Signs BP 116/65 (BP Location: Right Arm)    Pulse 85    Temp 98.5 F (36.9 C) (Oral)    Resp 20    Ht 5\' 5"  (1.651 m)    Wt 68.1 kg    SpO2 100%    BMI 24.98 kg/m   Physical Exam Vitals and nursing note reviewed.  Constitutional:      General: She is not in acute distress.    Appearance: She is well-developed. She is not ill-appearing.  HENT:     Head: Normocephalic and atraumatic.     Nose: Nose normal.  Eyes:     Extraocular Movements: Extraocular movements intact.     Conjunctiva/sclera: Conjunctivae normal.     Pupils: Pupils are equal, round, and reactive to light.  Cardiovascular:     Rate and Rhythm: Normal rate and regular rhythm.     Pulses: Normal pulses.     Heart sounds: Normal heart sounds. No murmur heard. Pulmonary:     Effort: Pulmonary effort is normal. No respiratory distress.     Breath sounds: Normal breath sounds.  Abdominal:     Palpations: Abdomen is soft.     Tenderness: There is no abdominal tenderness.  Musculoskeletal:        General: Tenderness present. No swelling.     Cervical back: Normal range of motion and neck supple.  Comments: Focal tenderness to the right lower back/right gluteal area with increased tone, no midline spinal pain  Skin:    General: Skin is warm and dry.     Capillary Refill: Capillary refill takes less than 2  seconds.  Neurological:     General: No focal deficit present.     Mental Status: She is alert and oriented to person, place, and time.     Cranial Nerves: No cranial nerve deficit.     Sensory: No sensory deficit.     Motor: No weakness.     Coordination: Coordination normal.     Comments: 5+ out of 5 strength, normal sensation  Psychiatric:        Mood and Affect: Mood normal.    ED Results / Procedures / Treatments   Labs (all labs ordered are listed, but only abnormal results are displayed) Labs Reviewed - No data to display  EKG None  Radiology No results found.  Procedures Procedures   Medications Ordered in ED Medications  lidocaine (LIDODERM) 5 % 1 patch (1 patch Transdermal Patch Applied 01/18/21 0545)  oxyCODONE (Oxy IR/ROXICODONE) immediate release tablet 5 mg (has no administration in time range)  diazepam (VALIUM) injection 2.5 mg (2.5 mg Intravenous Given 01/18/21 0546)  acetaminophen (TYLENOL) tablet 1,000 mg (1,000 mg Oral Given 01/18/21 0545)  dexamethasone (DECADRON) injection 10 mg (10 mg Intravenous Given 01/18/21 0545)    ED Course  I have reviewed the triage vital signs and the nursing notes.  Pertinent labs & imaging results that were available during my care of the patient were reviewed by me and considered in my medical decision making (see chart for details).    MDM Rules/Calculators/A&P                          Jennetta Flood is here with right lower back pain that has been going on since last night.  Patient with history of high cholesterol.  Patient with focal right lower back pain.  No symptoms to suggest cauda equina.  No midline spinal tenderness.  No trauma.  Pain does not radiate into the leg.  No abdominal pain.  No hematuria.  No loss of bowel or bladder.  No urinary retention.  She has not been taking her Celebrex for the last several days because she is supposed to have some sort of procedure done where they do not want her to be  on any anti-inflammatories.  Overall this appears to be a muscle spasm.  She does have allergy to Toradol.  Therefore we will give a dose of Valium, Decadron, lidocaine patch, Tylenol.  Will reevaluate but suspect I will discharge her with muscle relaxant, Roxicodone for breakthrough pain, Medrol Dosepak.  She has good strength and sensation in her lower extremities.  She has good pulses in her lower extremities.  No concern for blood clot or peripheral arterial process or kidney stone or other intra-abdominal process.  Patient feeling much improved after pain cocktail.  We will write her for muscle relaxant, Medrol Dosepak, Roxicodone for breakthrough pain.  Recommend using over-the-counter lidocaine patches.  Recommend restarting her Celebrex.  Recommend using Tylenol.  Return precautions given.  Recommend follow-up with primary care doctor.  This chart was dictated using voice recognition software.  Despite best efforts to proofread,  errors can occur which can change the documentation meaning.      Final Clinical Impression(s) / ED Diagnoses Final diagnoses:  Acute right-sided low  back pain without sciatica    Rx / DC Orders ED Discharge Orders          Ordered    tiZANidine (ZANAFLEX) 2 MG tablet  Every 8 hours PRN        01/18/21 0543    methylPREDNISolone (MEDROL DOSEPAK) 4 MG TBPK tablet        01/18/21 0543    oxyCODONE (ROXICODONE) 5 MG immediate release tablet  Every 6 hours PRN        01/18/21 0543             Lennice Sites, DO 01/18/21 [phone]

## 2021-01-18 NOTE — ED Triage Notes (Signed)
Right lower back pain that started last night.  Has not taken anything for pain.  Reports she has been taken off her celebrex.

## 2021-01-19 ENCOUNTER — Telehealth: Payer: Self-pay | Admitting: Cardiovascular Disease

## 2021-01-19 ENCOUNTER — Other Ambulatory Visit: Payer: Self-pay | Admitting: Cardiovascular Disease

## 2021-01-19 ENCOUNTER — Other Ambulatory Visit: Payer: Self-pay | Admitting: Podiatry

## 2021-01-19 DIAGNOSIS — G629 Polyneuropathy, unspecified: Secondary | ICD-10-CM | POA: Diagnosis not present

## 2021-01-19 DIAGNOSIS — E531 Pyridoxine deficiency: Secondary | ICD-10-CM | POA: Diagnosis not present

## 2021-01-19 DIAGNOSIS — E538 Deficiency of other specified B group vitamins: Secondary | ICD-10-CM | POA: Diagnosis not present

## 2021-01-19 NOTE — Telephone Encounter (Signed)
Please send refill to her preferred pharmacy with note to pharmacy saying 'patient misplaced previous refill'. She will likely have to pay for it out of pocket as insurance will not refill so quickly. Please call her to make her aware. It is a generic medication and should be relatively inexpensive.   Loel Dubonnet, NP

## 2021-01-19 NOTE — Progress Notes (Signed)
Subjective: 70 year old female presents the office today for follow-up evaluation after undergoing nerve biopsy.  I did call her with the results and she presents today for follow-up evaluation and also to possibly start EPAT.  She has no new concerns.  Overall symptoms unchanged.  Objective: AAO x3, NAD DP/PT pulses palpable bilaterally, CRT less than 3 seconds Procedure sites are healing well.  Small scab is still present there is no edema, erythema, drainage or pus or any signs of infection.   There is still tenderness palpation along plantar aspect calcaneus at insertion of plantar flexors with the arch of the foot.  There is no area of pinpoint tenderness.  No edema.  Negative Tinel sign.  Still has a constant burning sensation mostly to the left foot. No pain with calf compression, swelling, warmth, erythema  Assessment: Likely small fiber neuropathy left side, Plantar fasciitis/tendinitis  Plan: -All treatment options discussed with the patient including all alternatives, risks, complications.  -Again reviewed nerve biopsy with her.  We discussed different vitamins that she can take.  We will check B1, B6, B12. -We will schedule her to start EPAT -Patient encouraged to call the office with any questions, concerns, change in symptoms.   Trula Slade DPM

## 2021-01-19 NOTE — Telephone Encounter (Signed)
Pt c/o medication issue:  1. Name of Medication:  pravastatin (PRAVACHOL) 20 MG tablet  2. How are you currently taking this medication (dosage and times per day)? Has not taken since 01/17/21  3. Are you having a reaction (difficulty breathing--STAT)? No   4. What is your medication issue? Valerie Snyder is calling stating when she was hospitalized yesterday her husband packed up all her medication to take with her incase she ended up having to stay. After being released her pravastatin was some how misplaced. She is wanting to know if she will be able to get another prescription from the pharmacy due to just recently picking it up. She also requested to let Valerie Snyder know she and her husband said hello. Please advise.

## 2021-01-20 ENCOUNTER — Encounter: Payer: Self-pay | Admitting: Podiatry

## 2021-01-20 ENCOUNTER — Telehealth: Payer: Self-pay | Admitting: *Deleted

## 2021-01-20 ENCOUNTER — Other Ambulatory Visit: Payer: Self-pay | Admitting: Cardiovascular Disease

## 2021-01-20 DIAGNOSIS — Z5181 Encounter for therapeutic drug level monitoring: Secondary | ICD-10-CM | POA: Diagnosis not present

## 2021-01-20 DIAGNOSIS — E7800 Pure hypercholesterolemia, unspecified: Secondary | ICD-10-CM | POA: Diagnosis not present

## 2021-01-20 LAB — COMPREHENSIVE METABOLIC PANEL
ALT: 20 IU/L (ref 0–32)
AST: 17 IU/L (ref 0–40)
Albumin/Globulin Ratio: 2 (ref 1.2–2.2)
Albumin: 4.5 g/dL (ref 3.7–4.7)
Alkaline Phosphatase: 62 IU/L (ref 44–121)
BUN/Creatinine Ratio: 25 (ref 12–28)
BUN: 21 mg/dL (ref 8–27)
Bilirubin Total: 0.4 mg/dL (ref 0.0–1.2)
CO2: 26 mmol/L (ref 20–29)
Calcium: 9.8 mg/dL (ref 8.7–10.3)
Chloride: 102 mmol/L (ref 96–106)
Creatinine, Ser: 0.85 mg/dL (ref 0.57–1.00)
Globulin, Total: 2.2 g/dL (ref 1.5–4.5)
Glucose: 95 mg/dL (ref 70–99)
Potassium: 4.8 mmol/L (ref 3.5–5.2)
Sodium: 140 mmol/L (ref 134–144)
Total Protein: 6.7 g/dL (ref 6.0–8.5)
eGFR: 73 mL/min/{1.73_m2} (ref >59)

## 2021-01-20 LAB — LIPID PANEL
Chol/HDL Ratio: 2.4 ratio (ref 0.0–4.4)
Cholesterol, Total: 189 mg/dL (ref 100–199)
HDL: 80 mg/dL (ref >39)
LDL Chol Calc (NIH): 97 mg/dL (ref 0–99)
Triglycerides: 64 mg/dL (ref 0–149)
VLDL Cholesterol Cal: 12 mg/dL (ref 5–40)

## 2021-01-20 MED ORDER — PRAVASTATIN SODIUM 20 MG PO TABS: 20.0000 mg | ORAL_TABLET | Freq: Every day | ORAL | 3 refills | Status: DC

## 2021-01-20 NOTE — Telephone Encounter (Signed)
Patient is calling because she ended up in ER on Friday after coming off her anti inflammatory medicines, was advise by ER physician to resume medications. Arthritis has progressed so far, cannot be off medicine even for 1 week.  Her first appointment for Epat is scheduled for 13/30/22. Would like physician's recommendations/opinion at this point.

## 2021-01-20 NOTE — Telephone Encounter (Signed)
Pravastatin refilled and sent to local pharmacy for patient pickup!

## 2021-01-20 NOTE — Telephone Encounter (Signed)
Patient called again - does she need to cancel the appointment on 12/30 or keep it?  She can not be off of the anti-inflammatory medication. Please advise

## 2021-01-24 LAB — VITAMIN B6: Vitamin B6: 74.6 ng/mL — ABNORMAL HIGH (ref 2.1–21.7)

## 2021-01-24 LAB — VITAMIN B1: Vitamin B1 (Thiamine): 10 nmol/L (ref 8–30)

## 2021-01-24 LAB — VITAMIN B12: Vitamin B-12: 674 pg/mL (ref 200–1100)

## 2021-01-26 NOTE — Progress Notes (Signed)
Cardiology Office Note  Date:  01/27/2021   ID:  Shabria, Egley 21-Apr-1950, MRN [id number]  PCP:  Vernie Shanks, MD  Cardiologist:   Skeet Latch, MD  Rheum: Marella Chimes, PA  No chief complaint on file.  History of Present Illness: Valerie Snyder is a 70 y.o. female with asymptomatic coronary calcification and hyperlipidemia here for follow up.  In April 2020 she developed tachycardia while dancing.   She dances daily for at least 20 minutes.  One day she started feeling like heart was racing while dancing.  The episode lasted for around an hour.  She called her PCP who recommended that she be seen in the ED.  She had no chest pressure, shortness of breath, lightheadedness or dizziness.  By the time she got to the ED her symptoms had subsided.  Telemetry was unremarkable. She has a longstanding history of hyperlipidemia despite having an excellent diet and exercising regularly.  She has a strong family history of CAD and atrial fibrillation.    Valerie Snyder had a coronary calcium score 11/2018 that was 72nd perecentile for age/gender.  She started on Zetia and has tolerated it well.  In the past she tried atorvastatin and rosuvastatin but developed myalgias.  She thinks she has tried other statins as well but cannot remember them.  Her lipids were above goal so pravastatin was added to her regimen. She tolerated this well with no cramping. Valerie Snyder was seen in the ED 01/18/2021 with right lower back pain since the night prior. She had not been taking Celebrex for several days due to withholding it for a scheduled procedure. It was thought her symptoms were related to muscle spasm.  Today, she is accompanied by her husband. She is feeling pretty well overall. Since her ED visit 12/22 she has not had back pain as severe as then. She is concerned that one of her medications may be causing memory issues that she describes as "drawing a blank sometimes.  She monitors her weight a  few times a week. Lately she is exercising consistently about 40 min a day, but will rest if she begins to feel further back pain. Since her last visit she is following the mediterranean diet and is avoiding red meat. She has string cheese maybe a couple times a week. She follows up with her PCP on 02/19/21. For the past year she has suffered from severe pain in her left foot. An MRI has ruled out plantar fasciitis. 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 She denies any palpitations, chest pain, or shortness of breath. No lightheadedness, syncope, orthopnea, PND, or lower extremity edema.   Past Medical History:  Diagnosis Date   Abnormal uterine bleeding (AUB)    Allergy    Asthma    allergy related   Breast cancer of 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 breast ([place]) 11/13/2014   CAD in native artery 01/15/2019   Asymptomatic coronary calcification on coronary calcium score.  72nd percentile 11/2018.   Complication of anesthesia    10 years ago- pt states she stopped breathing and "had to be bagged"   Concussion 2018   GERD (gastroesophageal reflux disease)    H/O bilateral inguinal hernia repair 1992   Hypercholesteremia    Hypothyroidism    Migraines    Personal history of radiation therapy    Plantar fasciitis, bilateral    Pure hypercholesterolemia 10/10/2018   Radiation 01/12/15-02/04/15   right breast 42.17 gray  Spinal stenosis    Thyroid disease     Past Surgical History:  Procedure Laterality Date   BLADDER SUSPENSION     BLEPHAROPLASTY Bilateral    BREAST LUMPECTOMY Right 12/10/2014   BREAST LUMPECTOMY WITH RADIOACTIVE SEED LOCALIZATION Right 12/10/2014   Procedure: RIGHT BREAST LUMPECTOMY WITH RADIOACTIVE SEED LOCALIZATION;  Surgeon: Autumn Messing III, MD;  Location: [facility];  Service: General;  Laterality: Right;   [place] N/A 07/15/2020   Procedure: DILATATION & CURETTAGE/HYSTEROSCOPY  WITHPOLYPECTOMY USING MYOSURE;  Surgeon: Christophe Louis, MD;  Location: [facility];  Service: Gynecology;  Laterality: N/A;   FOOT SURGERY     plantar fashiectomies from both feet   HERNIA REPAIR     HYSTEROSCOPY WITH D & C N/A 12/27/2017   Procedure: DILATATION AND CURETTAGE /HYSTEROSCOPY;  Surgeon: Janyth Pupa, DO;  Location: [facility];  Service: Gynecology;  Laterality: N/A;   NASAL SEPTUM SURGERY     neuromas removed from right foot     osteoma  1991   removal from right orbital   POLYPECTOMY  12/27/2017   Procedure: POLYPECTOMY;  Surgeon: Janyth Pupa, DO;  Location: [facility];  Service: Gynecology;;   removal of basal cell carcinoma (nose)     TONSILECTOMY/ADENOIDECTOMY WITH MYRINGOTOMY     TONSILLECTOMY       Current Outpatient Medications  Medication Sig Dispense Refill   albuterol (VENTOLIN HFA) 108 (90 Base) MCG/ACT inhaler Inhale 2 puffs into the lungs every 4-6 hours as needed for cough, wheeze, shortness of breath or cough. 18 g 1   celecoxib (CELEBREX) 200 MG capsule Take 200 mg by mouth daily.     celecoxib (CELEBREX) 200 MG capsule 1 capsule with food     cetirizine (ZYRTEC) 10 MG tablet Take 10 mg by mouth daily as needed for allergies.      cetirizine (ZYRTEC) 10 MG tablet 1 tablet     cholecalciferol (VITAMIN D) 25 MCG (1000 UNIT) tablet 1 tablet     Coenzyme Q10 (CO Q-10) 100 MG CAPS 300mg      Coenzyme Q10 (COQ10) 100 MG CAPS Take 300 mg by mouth daily. 30 capsule    Cranberry 450 MG CAPS Take 900 mg by mouth daily.      Cranberry Extract 250 MG TABS      EPINEPHrine (EPIPEN 2-PAK) 0.3 mg/0.3 mL IJ SOAJ injection Inject 0.3 mLs (0.3 mg total) into the muscle once as needed. Reported on 01/20/2015 1 Device 1   ezetimibe (ZETIA) 10 MG tablet Take 1 tablet (10 mg total) by mouth daily. Please keep your upcoming appointment with Dr. Oval Linsey for refills. 90 tablet 0   fluticasone (FLONASE) 50 MCG/ACT nasal spray 1 spray  in each nostril     hydroxychloroquine (PLAQUENIL) 200 MG tablet in the morning and at bedtime.     hydroxychloroquine (PLAQUENIL) 200 MG tablet See admin instructions.     levothyroxine (SYNTHROID) 75 MCG tablet      levothyroxine (SYNTHROID) 75 MCG tablet 1 tablet in the morning on an empty stomach     Magnesium 250 MG TABS Take 1 tablet (250 mg total) by mouth every other day.  0   Misc Natural Products (OSTEO BI-FLEX ADV TRIPLE ST PO) Take 2 tablets by mouth daily.     Multiple Vitamins-Minerals (CENTRUM SILVER ADULT 50+ PO) Take 1 tablet by mouth daily.      Omega-3 Fatty Acids (FISH OIL) 1000 MG CAPS 1  capsule     Omega-3 Fatty Acids (OMEGA-3 FISH OIL) 1200 MG CAPS Take 0.8333 capsules (1,000 mg total) by mouth daily.     pantoprazole (PROTONIX) 20 MG tablet Take 1 tablet (20 mg total) by mouth daily.     pravastatin (PRAVACHOL) 20 MG tablet Take 1 tablet (20 mg total) by mouth daily. Patient misplaced previous refill, please fill 90 tablet 3   Probiotic Product (SOLUBLE FIBER/PROBIOTICS PO) Take 1 capsule by mouth daily. 20 billion     vitamin C (ASCORBIC ACID) 250 MG tablet Take 2 tablets (500 mg total) by mouth daily.     Zinc 50 MG TABS 1 tablet     zinc gluconate 50 MG tablet Take 1 tablet (50 mg total) by mouth daily.     No current facility-administered medications for this visit.    Allergies:   Bee venom, Cephalexin, Ketorolac tromethamine, Atorvastatin, Crestor [rosuvastatin], and Simvastatin    Social History:  The patient  reports that she has never smoked. She has never used smokeless tobacco. She reports that she does not drink alcohol and does not use drugs.   Family History:  The patient's family history includes Aneurysm in her maternal grandmother; Breast cancer in her cousin; Colon polyps in her daughter and mother; Congestive Heart Failure (age of onset: 26) in her mother; Dementia in her father; Depression in her father, sister, and sister; Emphysema in her  paternal grandfather; Heart Problems in her paternal grandfather; Heart attack in her maternal grandfather; Kidney failure in her father; Lung disease in her mother; Multiple sclerosis in her sister; Other in her sister; Pancreatic cancer in her maternal aunt; Stroke in her father; Testicular cancer in her cousin.    ROS:  Please see the history of present illness. (+) Memory disturbance (+) Chronic left foot pain All other systems are reviewed and negative.    PHYSICAL EXAM: VS:  BP 124/68    Pulse 76    Ht 5\' 5"  (1.651 m)    Wt 153 lb 1.6 oz (69.4 kg)    BMI 25.48 kg/m  , BMI Body mass index is 25.48 kg/m. GENERAL:  Well appearing HEENT: Pupils equal round and reactive, fundi not visualized, oral mucosa unremarkable NECK:  No jugular venous distention, waveform within normal limits, carotid upstroke brisk and symmetric, no bruits LUNGS:  Clear to auscultation bilaterally HEART:  RRR.  PMI not displaced or sustained,S1 and S2 within normal limits, no S3, no S4, no clicks, no rubs, no murmurs ABD:  Flat, positive bowel sounds normal in frequency in pitch, no bruits, no rebound, no guarding, no midline pulsatile mass, no hepatomegaly, no splenomegaly EXT:  2 plus pulses throughout, no edema, no cyanosis no clubbing SKIN:  No rashes no nodules NEURO:  Cranial nerves II through XII grossly intact, motor grossly intact throughout PSYCH:  Cognitively intact, oriented to person place and time   EKG:   01/27/2021: Sinus rhythm. Rate 76 bpm. 01/16/2020: Sinus rhythm.  Rate 73 bpm.  PACs. 10/10/2018: sinus rhythm.  Rate 87 bpm  CT Chest 12/04/2020: COMPARISON:  Chest CT 01/22/2020.   FINDINGS: Cardiovascular: Heart size is normal. There is no significant pericardial fluid, thickening or pericardial calcification. There is aortic atherosclerosis, as well as atherosclerosis of the great vessels of the mediastinum and the coronary arteries, including calcified atherosclerotic plaque in the  left main, left anterior descending and left circumflex coronary arteries.   Mediastinum/Nodes: No pathologically enlarged mediastinal, internal mammary or hilar lymph nodes. Esophagus is  unremarkable in appearance. No axillary lymphadenopathy.   Lungs/Pleura: 3 mm left upper lobe pulmonary nodule (axial image 52 of series 7), stable, considered benign. No other larger more suspicious appearing pulmonary nodules or masses are noted. No acute consolidative airspace disease. No pleural effusions. Areas of mild chronic linear scarring are noted in the medial aspects of the lower lobes of the lungs bilaterally.   Upper Abdomen: Calcified granuloma in the spleen incidentally noted. Diffuse low attenuation throughout the visualized hepatic parenchyma, suggestive of hepatic steatosis (difficult to say for certain on today's contrast enhanced examination).   Musculoskeletal: There are no aggressive appearing lytic or blastic lesions noted in the visualized portions of the skeleton.   IMPRESSION: 1. No definite evidence of metastatic disease in the thorax. 2. Previously noted left upper lobe 3 mm pulmonary nodule is stable and considered benign at this time. 3. Aortic atherosclerosis, in addition to left main and 2 vessel coronary artery disease. Assessment for potential risk factor modification, dietary therapy or pharmacologic therapy may be warranted, if clinically indicated. 4. Probable mild hepatic steatosis.   Aortic Atherosclerosis (ICD10-I70.0).  Bilateral LE Venous Reflux 03/28/2019: Summary:  Right:  - No evidence of deep vein thrombosis seen in the right lower extremity,  from the common femoral through the popliteal veins.  - No evidence of superficial venous thrombosis in the right lower  extremity.  - Venous reflux is noted in the right greater saphenous vein in the calf.     Left:  - No evidence of deep vein thrombosis seen in the left lower extremity,  from the  common femoral through the popliteal veins.  - No evidence of superficial venous thrombosis in the left lower  extremity.  - Venous reflux is noted in the left greater saphenous vein in the calf.  - Venous reflux is noted in the left short saphenous vein.   Coronary calcium score 11/07/18: IMPRESSION: Coronary calcium score of 95.93. This was 72nd percentile for age and sex matched control.    Recent Labs: 12/03/2020: Hemoglobin 13.1; Platelet Count 181 01/20/2021: ALT 20; BUN 21; Creatinine, Ser 0.85; Potassium 4.8; Sodium 140   Lipid Panel    Component Value Date/Time   CHOL 189 01/20/2021 1020   TRIG 64 01/20/2021 1020   HDL 80 01/20/2021 1020   CHOLHDL 2.4 01/20/2021 1020   LDLCALC 97 01/20/2021 1020    Wt Readings from Last 3 Encounters:  01/27/21 153 lb 1.6 oz (69.4 kg)  01/18/21 150 lb 2.1 oz (68.1 kg)  12/08/20 150 lb 1.6 oz (68.1 kg)     ASSESSMENT AND PLAN:  CAD in native artery Nonobstructive disease.  Noted on CT. she is asymptomatic.  Continue with aggressive lipid management, diet, and exercise.  Continue Zetia.  She is going to see her neurologist for an alternative to sumatriptan.  Hold pravastatin for 2 weeks given her concerned about mental fogginess.  If it improves we will need to switch to a PCSK9 inhibitor.  If it persists then she will resume her statin and we need to increase the dose to 40 mg to achieve an LDL goal less than 70.   Pure hypercholesterolemia Lipid management as above.  LDL goal is less than 70.   Current medicines are reviewed at length with the patient today.  The patient does not have concerns regarding medicines.  The following changes have been made:  pravastatin  Labs/ tests ordered today include:   No orders of the defined types were placed in  this encounter.   Disposition:   FU with Tambra Muller C. Oval Linsey, MD, [facility] in 4 months.   I,Mathew Stumpf,acting as a Education administrator for Skeet Latch, MD.,have documented all relevant  documentation on the behalf of Skeet Latch, MD,as directed by  Skeet Latch, MD while in the presence of Skeet Latch, MD.  I, [place] Oval Linsey, MD have reviewed all documentation for this visit.  The documentation of the exam, diagnosis, procedures, and orders on 01/27/2021 are all accurate and complete.  Time spent: 32 minutes-Greater than 50% of this time was spent in counseling, explanation of diagnosis, planning of further management, and coordination of care.   Signed, [place] Greeson C. Oval Linsey, MD, [facility]  01/27/2021 10:45 AM    [place]

## 2021-01-27 ENCOUNTER — Other Ambulatory Visit: Payer: Self-pay

## 2021-01-27 ENCOUNTER — Ambulatory Visit (INDEPENDENT_AMBULATORY_CARE_PROVIDER_SITE_OTHER): Payer: Medicare Other | Admitting: Cardiovascular Disease

## 2021-01-27 ENCOUNTER — Other Ambulatory Visit: Payer: Medicare Other

## 2021-01-27 ENCOUNTER — Encounter (HOSPITAL_BASED_OUTPATIENT_CLINIC_OR_DEPARTMENT_OTHER): Payer: Self-pay | Admitting: Cardiovascular Disease

## 2021-01-27 DIAGNOSIS — E7800 Pure hypercholesterolemia, unspecified: Secondary | ICD-10-CM

## 2021-01-27 DIAGNOSIS — I2510 Atherosclerotic heart disease of native coronary artery without angina pectoris: Secondary | ICD-10-CM

## 2021-01-27 NOTE — Patient Instructions (Signed)
Medication Instructions:  HOLD YOUR PRAVASTATIN  FOR 2 WEEKS AND SEE IF BRAIN FOG IMPROVES, OK TO HOLD AS LONG AS 4  CALL IF IMPROVES CALL OFFICE TO FOLLOW UP  IF NOT RESUME PRAVASTATIN   *If you need a refill on your cardiac medications before your next appointment, please call your pharmacy*  Lab Work: NONE   Testing/Procedures: NONE   Follow-Up: At [facility], you and your health needs are our priority.  As part of our continuing mission to provide you with exceptional heart care, we have created designated Provider Care Teams.  These Care Teams include your primary Cardiologist (physician) and Advanced Practice Providers (APPs -  Physician Assistants and Nurse Practitioners) who all work together to provide you with the care you need, when you need it.  We recommend signing up for the patient portal called "MyChart".  Sign up information is provided on this After Visit Summary.  MyChart is used to connect with patients for Virtual Visits (Telemedicine).  Patients are able to view lab/test results, encounter notes, upcoming appointments, etc.  Non-urgent messages can be sent to your provider as well.   To learn more about what you can do with MyChart, go to [email or web address].    Your next appointment:   4 month(s)  The format for your next appointment:   In Person  Provider:   Skeet Latch, MD

## 2021-01-27 NOTE — Assessment & Plan Note (Signed)
Lipid management as above.  LDL goal is less than 70.

## 2021-01-27 NOTE — Assessment & Plan Note (Signed)
Nonobstructive disease.  Noted on CT. she is asymptomatic.  Continue with aggressive lipid management, diet, and exercise.  Continue Zetia.  She is going to see her neurologist for an alternative to sumatriptan.  Hold pravastatin for 2 weeks given her concerned about mental fogginess.  If it improves we will need to switch to a PCSK9 inhibitor.  If it persists then she will resume her statin and we need to increase the dose to 40 mg to achieve an LDL goal less than 70.

## 2021-01-27 NOTE — Addendum Note (Signed)
Addended by: Gerald Stabs on: 01/27/2021 11:44 AM   Modules accepted: Orders

## 2021-01-29 ENCOUNTER — Other Ambulatory Visit: Payer: Medicare Other

## 2021-02-09 ENCOUNTER — Other Ambulatory Visit: Payer: Self-pay | Admitting: Family Medicine

## 2021-02-09 ENCOUNTER — Ambulatory Visit
Admission: RE | Admit: 2021-02-09 | Discharge: 2021-02-09 | Disposition: A | Discharge status: Home / Self Care | Payer: Medicare Other | Source: Ambulatory Visit | Attending: Family Medicine | Admitting: Family Medicine

## 2021-02-09 ENCOUNTER — Other Ambulatory Visit: Payer: Self-pay

## 2021-02-09 DIAGNOSIS — R1084 Generalized abdominal pain: Secondary | ICD-10-CM | POA: Diagnosis not present

## 2021-02-09 DIAGNOSIS — R109 Unspecified abdominal pain: Secondary | ICD-10-CM

## 2021-02-09 DIAGNOSIS — K8020 Calculus of gallbladder without cholecystitis without obstruction: Secondary | ICD-10-CM | POA: Diagnosis not present

## 2021-02-09 DIAGNOSIS — D7389 Other diseases of spleen: Secondary | ICD-10-CM | POA: Diagnosis not present

## 2021-02-09 DIAGNOSIS — K5730 Diverticulosis of large intestine without perforation or abscess without bleeding: Secondary | ICD-10-CM | POA: Diagnosis not present

## 2021-02-09 DIAGNOSIS — Z853 Personal history of malignant neoplasm of breast: Secondary | ICD-10-CM | POA: Diagnosis not present

## 2021-02-09 MED ORDER — IOPAMIDOL (ISOVUE-300) INJECTION 61%
100.0000 mL | Freq: Once | INTRAVENOUS | Status: AC | PRN
  Administered 2021-02-09: 100 mL via INTRAVENOUS

## 2021-02-16 ENCOUNTER — Other Ambulatory Visit: Payer: Self-pay | Admitting: Podiatry

## 2021-02-16 DIAGNOSIS — M722 Plantar fascial fibromatosis: Secondary | ICD-10-CM

## 2021-02-16 DIAGNOSIS — G629 Polyneuropathy, unspecified: Secondary | ICD-10-CM

## 2021-02-19 ENCOUNTER — Encounter (HOSPITAL_COMMUNITY): Payer: Self-pay | Admitting: Emergency Medicine

## 2021-02-19 ENCOUNTER — Emergency Department (HOSPITAL_COMMUNITY): Payer: Medicare Other

## 2021-02-19 ENCOUNTER — Other Ambulatory Visit: Payer: Self-pay

## 2021-02-19 ENCOUNTER — Observation Stay (HOSPITAL_COMMUNITY)
Admission: EM | Admit: 2021-02-19 | Discharge: 2021-02-21 | Disposition: A | Discharge status: Home / Self Care | Payer: Medicare Other | Attending: Emergency Medicine | Admitting: Emergency Medicine

## 2021-02-19 DIAGNOSIS — K819 Cholecystitis, unspecified: Secondary | ICD-10-CM | POA: Diagnosis present

## 2021-02-19 DIAGNOSIS — E039 Hypothyroidism, unspecified: Secondary | ICD-10-CM | POA: Diagnosis not present

## 2021-02-19 DIAGNOSIS — Z20822 Contact with and (suspected) exposure to covid-19: Secondary | ICD-10-CM | POA: Diagnosis not present

## 2021-02-19 DIAGNOSIS — Z79899 Other long term (current) drug therapy: Secondary | ICD-10-CM | POA: Diagnosis not present

## 2021-02-19 DIAGNOSIS — Z853 Personal history of malignant neoplasm of breast: Secondary | ICD-10-CM | POA: Insufficient documentation

## 2021-02-19 DIAGNOSIS — K8020 Calculus of gallbladder without cholecystitis without obstruction: Secondary | ICD-10-CM | POA: Diagnosis not present

## 2021-02-19 DIAGNOSIS — Z923 Personal history of irradiation: Secondary | ICD-10-CM | POA: Insufficient documentation

## 2021-02-19 DIAGNOSIS — I2510 Atherosclerotic heart disease of native coronary artery without angina pectoris: Secondary | ICD-10-CM | POA: Diagnosis not present

## 2021-02-19 DIAGNOSIS — K8010 Calculus of gallbladder with chronic cholecystitis without obstruction: Principal | ICD-10-CM | POA: Insufficient documentation

## 2021-02-19 DIAGNOSIS — R1011 Right upper quadrant pain: Secondary | ICD-10-CM

## 2021-02-19 DIAGNOSIS — K810 Acute cholecystitis: Secondary | ICD-10-CM

## 2021-02-19 DIAGNOSIS — K8000 Calculus of gallbladder with acute cholecystitis without obstruction: Secondary | ICD-10-CM | POA: Diagnosis not present

## 2021-02-19 LAB — URINALYSIS, ROUTINE W REFLEX MICROSCOPIC
Bilirubin Urine: NEGATIVE
Glucose, UA: NEGATIVE mg/dL
Ketones, ur: 5 mg/dL — AB
Leukocytes,Ua: NEGATIVE
Nitrite: NEGATIVE
Protein, ur: NEGATIVE mg/dL
Specific Gravity, Urine: 1.013 (ref 1.005–1.030)
pH: 7 (ref 5.0–8.0)

## 2021-02-19 LAB — COMPREHENSIVE METABOLIC PANEL
ALT: 21 U/L (ref 0–44)
AST: 23 U/L (ref 15–41)
Albumin: 4 g/dL (ref 3.5–5.0)
Alkaline Phosphatase: 49 U/L (ref 38–126)
Anion gap: 6 (ref 5–15)
BUN: 9 mg/dL (ref 8–23)
CO2: 28 mmol/L (ref 22–32)
Calcium: 9.4 mg/dL (ref 8.9–10.3)
Chloride: 106 mmol/L (ref 98–111)
Creatinine, Ser: 0.77 mg/dL (ref 0.44–1.00)
GFR, Estimated: >60 mL/min (ref >60)
Glucose, Bld: 97 mg/dL (ref 70–99)
Potassium: 4 mmol/L (ref 3.5–5.1)
Sodium: 140 mmol/L (ref 135–145)
Total Bilirubin: 0.9 mg/dL (ref 0.3–1.2)
Total Protein: 6.6 g/dL (ref 6.5–8.1)

## 2021-02-19 LAB — CBC
HCT: 41.6 % (ref 36.0–46.0)
Hemoglobin: 13.7 g/dL (ref 12.0–15.0)
MCH: 30.7 pg (ref 26.0–34.0)
MCHC: 32.9 g/dL (ref 30.0–36.0)
MCV: 93.3 fL (ref 80.0–100.0)
Platelets: 202 10*3/uL (ref 150–400)
RBC: 4.46 MIL/uL (ref 3.87–5.11)
RDW: 11.9 % (ref 11.5–15.5)
WBC: 3.8 10*3/uL — ABNORMAL LOW (ref 4.0–10.5)
nRBC: 0 % (ref 0.0–0.2)

## 2021-02-19 LAB — LIPASE, BLOOD: Lipase: 44 U/L (ref 11–51)

## 2021-02-19 LAB — RESP PANEL BY RT-PCR (FLU A&B, COVID) ARPGX2
Influenza A by PCR: NEGATIVE
Influenza B by PCR: NEGATIVE
SARS Coronavirus 2 by RT PCR: NEGATIVE

## 2021-02-19 LAB — SURGICAL PCR SCREEN
MRSA, PCR: NEGATIVE
Staphylococcus aureus: NEGATIVE

## 2021-02-19 MED ORDER — FENTANYL CITRATE PF 50 MCG/ML IJ SOSY
50.0000 ug | PREFILLED_SYRINGE | Freq: Once | INTRAMUSCULAR | Status: AC
  Administered 2021-02-19: 50 ug via INTRAVENOUS
  Filled 2021-02-19: qty 1

## 2021-02-19 MED ORDER — PANTOPRAZOLE SODIUM 20 MG PO TBEC
20.0000 mg | DELAYED_RELEASE_TABLET | Freq: Every day | ORAL | Status: DC
  Filled 2021-02-19 (×2): qty 1

## 2021-02-19 MED ORDER — CIPROFLOXACIN IN D5W 400 MG/200ML IV SOLN
400.0000 mg | INTRAVENOUS | Status: AC
  Administered 2021-02-20: 400 mg via INTRAVENOUS
  Filled 2021-02-19: qty 200

## 2021-02-19 MED ORDER — ALBUTEROL SULFATE (2.5 MG/3ML) 0.083% IN NEBU: 2.5000 mg | INHALATION_SOLUTION | RESPIRATORY_TRACT | Status: DC | PRN

## 2021-02-19 MED ORDER — OXYCODONE HCL 5 MG PO TABS: 5.0000 mg | ORAL_TABLET | ORAL | Status: DC | PRN

## 2021-02-19 MED ORDER — METRONIDAZOLE 500 MG/100ML IV SOLN: 500.0000 mg | INTRAVENOUS | Status: DC

## 2021-02-19 MED ORDER — OXYCODONE HCL 5 MG PO TABS
10.0000 mg | ORAL_TABLET | ORAL | Status: DC | PRN
  Administered 2021-02-20 (×2): 10 mg via ORAL
  Filled 2021-02-19 (×2): qty 2

## 2021-02-19 MED ORDER — PROCHLORPERAZINE EDISYLATE 10 MG/2ML IJ SOLN
10.0000 mg | INTRAMUSCULAR | Status: DC | PRN
  Administered 2021-02-20: 10 mg via INTRAVENOUS
  Filled 2021-02-19: qty 2

## 2021-02-19 MED ORDER — ACETAMINOPHEN 325 MG PO TABS
650.0000 mg | ORAL_TABLET | Freq: Four times a day (QID) | ORAL | Status: DC
  Filled 2021-02-19 (×2): qty 2

## 2021-02-19 MED ORDER — HYDROMORPHONE HCL 1 MG/ML IJ SOLN
0.5000 mg | INTRAMUSCULAR | Status: DC | PRN
  Administered 2021-02-20: 0.5 mg via INTRAVENOUS
  Filled 2021-02-19: qty 0.5

## 2021-02-19 MED ORDER — EPINEPHRINE 0.3 MG/0.3ML IJ SOAJ
0.3000 mg | Freq: Once | INTRAMUSCULAR | Status: DC | PRN
  Filled 2021-02-19: qty 0.6

## 2021-02-19 MED ORDER — ONDANSETRON HCL 4 MG/2ML IJ SOLN
4.0000 mg | Freq: Four times a day (QID) | INTRAMUSCULAR | Status: DC | PRN
  Administered 2021-02-20: 4 mg via INTRAVENOUS
  Filled 2021-02-19: qty 2

## 2021-02-19 MED ORDER — METHOCARBAMOL 1000 MG/10ML IJ SOLN
500.0000 mg | Freq: Four times a day (QID) | INTRAVENOUS | Status: DC | PRN
  Administered 2021-02-20: 500 mg via INTRAVENOUS
  Filled 2021-02-19: qty 5
  Filled 2021-02-19: qty 500
  Filled 2021-02-19: qty 5

## 2021-02-19 MED ORDER — DOCUSATE SODIUM 100 MG PO CAPS
100.0000 mg | ORAL_CAPSULE | Freq: Two times a day (BID) | ORAL | Status: DC
  Administered 2021-02-19 – 2021-02-21 (×3): 100 mg via ORAL
  Filled 2021-02-19 (×3): qty 1

## 2021-02-19 MED ORDER — LEVOTHYROXINE SODIUM 75 MCG PO TABS
75.0000 ug | ORAL_TABLET | Freq: Every day | ORAL | Status: DC
  Administered 2021-02-20: 75 ug via ORAL

## 2021-02-19 MED ORDER — EZETIMIBE 10 MG PO TABS
10.0000 mg | ORAL_TABLET | Freq: Every day | ORAL | Status: DC
  Filled 2021-02-19: qty 1

## 2021-02-19 MED ORDER — LACTATED RINGERS IV SOLN: INTRAVENOUS | Status: DC

## 2021-02-19 MED ORDER — SIMETHICONE 80 MG PO CHEW: 80.0000 mg | CHEWABLE_TABLET | Freq: Four times a day (QID) | ORAL | Status: DC | PRN

## 2021-02-19 MED ORDER — ENOXAPARIN SODIUM 40 MG/0.4ML IJ SOSY
40.0000 mg | PREFILLED_SYRINGE | INTRAMUSCULAR | Status: DC
  Administered 2021-02-19 – 2021-02-20 (×2): 40 mg via SUBCUTANEOUS
  Filled 2021-02-19 (×2): qty 0.4

## 2021-02-19 MED ORDER — FLUTICASONE PROPIONATE 50 MCG/ACT NA SUSP: 1.0000 | Freq: Every day | NASAL | Status: DC

## 2021-02-19 NOTE — ED Provider Triage Note (Signed)
Emergency Medicine Provider Triage Evaluation Note  Valerie Snyder , a 71 y.o. female  was evaluated in triage.  Pt complains of abdominal pain  Review of Systems  Positive: Abdominal pain and diarrhea  Negative:  fever   Physical Exam  BP 135/66 (BP Location: Right Arm)    Pulse 79    Temp 98.3 F (36.8 C) (Oral)    Resp 20    SpO2 99%  Gen:   Awake, no distress   Resp:  Normal effort  MSK:   Moves extremities without difficulty  Other:  Abdomen diffusely tender   Medical Decision Making  Medically screening exam initiated at 1:08 PM.  Appropriate orders placed.  Valerie Snyder was informed that the remainder of the evaluation will be completed by another provider, this initial triage assessment does not replace that evaluation, and the importance of remaining in the ED until their evaluation is complete.     Valerie Snyder, [place] 02/19/21 1309

## 2021-02-19 NOTE — ED Triage Notes (Addendum)
Patient sent to [facility] by PCP with request for surgery consultation for cholecystitis without obstruction. Patient reports RUQ abdominal pain for eighteen days. Patient alert, oriented, ambulatory, and in no apparent distress at this time.  Last oral intake at 0800 today, half a cup of brown rice.

## 2021-02-19 NOTE — H&P (Signed)
Valerie Snyder August 14, 1950  [id number].    Requesting MD: Dr. Sherwood Gambler Chief Complaint/Reason for Consult: RUQ abdominal pain, known hx of Cholelithiasis   HPI: Valerie Snyder is a 71 y.o. female with a hx hyperlipidemia, hypothyroidism and remote history of right breast cancer status post lumpectomy who presented to the ED for RUQ abdominal pain.  Patient reports over the last 2 weeks she has been having waxing/waning epigastric abdominal pain with radiation to her RUQ with associated nausea and distention.  She has also had some inflammation, pain into her shoulders and back and other symptoms she is not sure are related.  She reports that symptoms are exacerbated by p.o. intake and sometimes position.  Nothing seems to make this better.  No history of similar symptoms prior to 2 weeks ago.  She had a CT scan done 10 days ago that showed cholelithiasis without evidence of cholecystitis.  She saw her PCP , Dr. Jacelyn Grip, who recommended her go to the emergency department for evaluation.  In the ED patient is afebrile without tachycardia or hypotension.  WBC without elevation.  LFTs within normal limits.  Lipase within normal limits.  Ultrasound with positive sonographic murphy sign.  Patient History of bilateral inguinal hernia repair in 1992  ROS: Review of Systems  Constitutional:  Negative for chills and fever.  Gastrointestinal:  Positive for abdominal pain, diarrhea and nausea.  All other systems reviewed and are negative.  Family History  Problem Relation Age of Onset   Pancreatic cancer Maternal Aunt        dx. 40s-early 66s   Congestive Heart Failure Mother 34   Lung disease Mother    Colon polyps Mother        unspecified number   Dementia Father        Lewy Body Dementia   Kidney failure Father    Stroke Father    Depression Father    Multiple sclerosis Sister    Depression Sister    Aneurysm Maternal Grandmother        brain   Heart attack Maternal  Grandfather    Emphysema Paternal Grandfather    Heart Problems Paternal Grandfather    Other Sister        3 sisters have history of cysts in uterus   Depression Sister    Breast cancer Cousin        dx. 62-50   Testicular cancer Cousin        dx. 85s   Colon polyps Daughter        unspecified number    Past Medical History:  Diagnosis Date   Abnormal uterine bleeding (AUB)    Allergy    Asthma    allergy related   Breast cancer of 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 breast ([place]) 11/13/2014   CAD in native artery 01/15/2019   Asymptomatic coronary calcification on coronary calcium score.  72nd percentile 11/2018.   Complication of anesthesia    10 years ago- pt states she stopped breathing and "had to be bagged"   Concussion 2018   GERD (gastroesophageal reflux disease)    H/O bilateral inguinal hernia repair 1992   Hypercholesteremia    Hypothyroidism    Migraines    Personal history of radiation therapy    Plantar fasciitis, bilateral    Pure hypercholesterolemia 10/10/2018   Radiation 01/12/15-02/04/15   right breast 42.17 gray   Spinal stenosis    Thyroid disease     Past Surgical History:  Procedure Laterality Date   BLADDER SUSPENSION     BLEPHAROPLASTY Bilateral    BREAST LUMPECTOMY Right 12/10/2014   BREAST LUMPECTOMY WITH RADIOACTIVE SEED LOCALIZATION Right 12/10/2014   Procedure: RIGHT BREAST LUMPECTOMY WITH RADIOACTIVE SEED LOCALIZATION;  Surgeon: Autumn Messing III, MD;  Location: [facility];  Service: General;  Laterality: Right;   [place] N/A 07/15/2020   Procedure: DILATATION & CURETTAGE/HYSTEROSCOPY WITHPOLYPECTOMY USING MYOSURE;  Surgeon: Christophe Louis, MD;  Location: [facility];  Service: Gynecology;  Laterality: N/A;   FOOT SURGERY     plantar fashiectomies from both feet   HERNIA REPAIR     HYSTEROSCOPY WITH D & C N/A 12/27/2017   Procedure: DILATATION AND CURETTAGE /HYSTEROSCOPY;   Surgeon: Janyth Pupa, DO;  Location: [facility];  Service: Gynecology;  Laterality: N/A;   NASAL SEPTUM SURGERY     neuromas removed from right foot     osteoma  1991   removal from right orbital   POLYPECTOMY  12/27/2017   Procedure: POLYPECTOMY;  Surgeon: Janyth Pupa, DO;  Location: [facility];  Service: Gynecology;;   removal of basal cell carcinoma (nose)     TONSILECTOMY/ADENOIDECTOMY WITH MYRINGOTOMY     TONSILLECTOMY      Social History:  reports that she has never smoked. She has never used smokeless tobacco. She reports that she does not drink alcohol and does not use drugs.  Allergies:  Allergies  Allergen Reactions   Bee Venom Anaphylaxis   Cephalexin Anaphylaxis    Puffy eyes and lips Other reaction(s): facial swelling   Ketorolac Tromethamine Anaphylaxis    Puffy lips and eyes Other reaction(s): anaphylaxis   Atorvastatin     Other reaction(s): muscle cramps   Crestor [Rosuvastatin]     Other reaction(s): muscle cramps   Simvastatin     Other reaction(s): muscle cramps    (Not in a hospital admission)    Physical Exam: Blood pressure 124/63, pulse 81, temperature 98 F (36.7 C), temperature source Oral, resp. rate 20, SpO2 100 %. General: pleasant, WD/WN white female who is laying in bed in NAD HEENT: head is normocephalic, atraumatic.  Sclera are noninjected.  PERRL.  Ears and nose without any masses or lesions.  Mouth is pink and moist. Dentition fair Heart: regular, rate, and rhythm.  Normal s1,s2. No obvious murmurs, gallops, or rubs noted.  Palpable pedal pulses bilaterally  Lungs: CTAB, no wheezes, rhonchi, or rales noted.  Respiratory effort nonlabored Abd: Soft, NT/ND, +BS, no masses, hernias, or organomegaly, tender RUQ MS: no BUE/BLE edema, calves soft and nontender Skin: warm and dry with no masses, lesions, or rashes Psych: A&Ox4 with an appropriate affect Neuro: cranial nerves grossly intact, equal strength  in BUE/BLE bilaterally, normal speech, thought process intact, moves all extremities, gait not assessed   Results for orders placed or performed during the hospital encounter of 02/19/21 (from the past 48 hour(s))  Urinalysis, Routine w reflex microscopic Urine, Clean Catch     Status: Abnormal   Collection Time: 02/19/21 12:45 PM  Result Value Ref Range   Color, Urine YELLOW YELLOW   APPearance CLEAR CLEAR   Specific Gravity, Urine 1.013 1.005 - 1.030   pH 7.0 5.0 - 8.0   Glucose, UA NEGATIVE NEGATIVE mg/dL   Hgb urine dipstick SMALL (A) NEGATIVE   Bilirubin Urine NEGATIVE NEGATIVE   Ketones, ur 5 (A) NEGATIVE mg/dL   Protein, ur NEGATIVE NEGATIVE mg/dL   Nitrite NEGATIVE  NEGATIVE   Leukocytes,Ua NEGATIVE NEGATIVE   RBC / HPF 21-50 0 - 5 RBC/hpf   WBC, UA 0-5 0 - 5 WBC/hpf   Bacteria, UA RARE (A) NONE SEEN   Squamous Epithelial / LPF 0-5 0 - 5    Comment: Performed at [facility], [place] [address] [place], [place] [address]  Resp Panel by RT-PCR (Flu A&B, Covid) Nasopharyngeal Swab     Status: None   Collection Time: 02/19/21  1:10 PM   Specimen: Nasopharyngeal Swab; Nasopharyngeal(NP) swabs in vial transport medium  Result Value Ref Range   SARS Coronavirus 2 by RT PCR NEGATIVE NEGATIVE    Comment: (NOTE) SARS-CoV-2 target nucleic acids are NOT DETECTED.  The SARS-CoV-2 RNA is generally detectable in upper respiratory specimens during the acute phase of infection. The lowest concentration of SARS-CoV-2 viral copies this assay can detect is 138 copies/mL. A negative result does not preclude SARS-Cov-2 infection and should not be used as the sole basis for treatment or other patient management decisions. A negative result may occur with  improper specimen collection/handling, submission of specimen other than nasopharyngeal swab, presence of viral mutation(s) within the areas targeted by this assay, and inadequate number of viral copies(<138 copies/mL). A negative result  must be combined with clinical observations, patient history, and epidemiological information. The expected result is Negative.  Fact Sheet for Patients:  EntrepreneurPulse.com.au  Fact Sheet for Healthcare Providers:  IncredibleEmployment.be  This test is no t yet approved or cleared by the [place] FDA and  has been authorized for detection and/or diagnosis of SARS-CoV-2 by FDA under an Emergency Use Authorization (EUA). This EUA will remain  in effect (meaning this test can be used) for the duration of the COVID-19 declaration under Section 564(b)(1) of the Act, 21 U.S.C.section 360bbb-3(b)(1), unless the authorization is terminated  or revoked sooner.       Influenza A by PCR NEGATIVE NEGATIVE   Influenza B by PCR NEGATIVE NEGATIVE    Comment: (NOTE) The Xpert Xpress SARS-CoV-2/FLU/RSV plus assay is intended as an aid in the diagnosis of influenza from Nasopharyngeal swab specimens and should not be used as a sole basis for treatment. Nasal washings and aspirates are unacceptable for Xpert Xpress SARS-CoV-2/FLU/RSV testing.  Fact Sheet for Patients: EntrepreneurPulse.com.au  Fact Sheet for Healthcare Providers: IncredibleEmployment.be  This test is not yet approved or cleared by the [place] FDA and has been authorized for detection and/or diagnosis of SARS-CoV-2 by FDA under an Emergency Use Authorization (EUA). This EUA will remain in effect (meaning this test can be used) for the duration of the COVID-19 declaration under Section 564(b)(1) of the Act, 21 U.S.C. section 360bbb-3(b)(1), unless the authorization is terminated or revoked.  Performed at [facility], [place] [address] [place], [place] [address]   Lipase, blood     Status: None   Collection Time: 02/19/21  1:13 PM  Result Value Ref Range   Lipase 44 11 - 51 U/L    Comment: Performed at [facility] [address], [place], [place] [address]  Comprehensive metabolic panel     Status: None   Collection Time: 02/19/21  1:13 PM  Result Value Ref Range   Sodium 140 135 - 145 mmol/L   Potassium 4.0 3.5 - 5.1 mmol/L   Chloride 106 98 - 111 mmol/L   CO2 28 22 - 32 mmol/L   Glucose, Bld 97 70 - 99 mg/dL    Comment: Glucose reference  range applies only to samples taken after fasting for at least 8 hours.   BUN 9 8 - 23 mg/dL   Creatinine, Ser 0.77 0.44 - 1.00 mg/dL   Calcium 9.4 8.9 - 10.3 mg/dL   Total Protein 6.6 6.5 - 8.1 g/dL   Albumin 4.0 3.5 - 5.0 g/dL   AST 23 15 - 41 U/L   ALT 21 0 - 44 U/L   Alkaline Phosphatase 49 38 - 126 U/L   Total Bilirubin 0.9 0.3 - 1.2 mg/dL   GFR, Estimated >60 >60 mL/min    Comment: (NOTE) Calculated using the CKD-EPI Creatinine Equation (2021)    Anion gap 6 5 - 15    Comment: Performed at [facility] [address], [place], [place] [address]  CBC     Status: Abnormal   Collection Time: 02/19/21  1:13 PM  Result Value Ref Range   WBC 3.8 (L) 4.0 - 10.5 K/uL   RBC 4.46 3.87 - 5.11 MIL/uL   Hemoglobin 13.7 12.0 - 15.0 g/dL   HCT 41.6 36.0 - 46.0 %   MCV 93.3 80.0 - 100.0 fL   MCH 30.7 26.0 - 34.0 pg   MCHC 32.9 30.0 - 36.0 g/dL   RDW 11.9 11.5 - 15.5 %   Platelets 202 150 - 400 K/uL   nRBC 0.0 0.0 - 0.2 %    Comment: Performed at [facility], [place] [address], [place], [place] [address]   No results found.  Anti-infectives (From admission, onward)    None      Imaging: RUQ [place]:  IMPRESSION: Cholelithiasis with mild gallbladder wall thickening and positive sonographic Murphy sign. No biliary ductal dilatation. Findings are concerning for acute cholecystitis.  Assessment/Plan Ms. Standish is a 71 year old female who presented with abdominal pain. Workup was consistent with cholecystitis.  I recommended laparoscopic cholecystectomy.  The procedure itself as well as its risks, benefits and alternatives were discussed and the  patient granted consent to proceed.  We will add her on to the OR schedule with Dr. Rosendo Gros.   FEN - CLD, NPO since midnight VTE - SCDs, Lovenox ID - Ancef preop Foley - None Dispo - Admit to med-surg.   High Medical Decision Making  Felicie Morn, [facility] 02/19/2021, 4:19 PM Please see Amion for pager number during day hours 7:00am-4:30pm

## 2021-02-19 NOTE — Plan of Care (Signed)

## 2021-02-19 NOTE — ED Provider Notes (Signed)
[facility] EMERGENCY DEPARTMENT Provider Note   CSN: [id number] Arrival date & time: 02/19/21  1245     History  Chief Complaint  Patient presents with   Abdominal Pain    Valerie Snyder is a 71 y.o. female.  HPI 71 year old female presents with abdominal pain.  She was sent in by her PCP, Dr. Jacelyn Grip, to get a surgical evaluation for her gallbladder.  Patient states she has been having abdominal distention for about 16 days.  Has been having pretty much constant abdominal pain, mostly right-sided and also epigastric.  Pain is waxing and waning as well, sometimes with food sometimes with position.  No fevers or vomiting.  She has had intermittent diarrhea and her stools have become yellow though they are becoming more brown now.  She had a CT scan 10 days ago by primary care that showed cholelithiasis.  Because of her continued pain, when she saw her PCP, Dr. Jacelyn Grip, today, he recommended she come to the emergency department for general surgery evaluation.  Family states that Dr. Rosendo Gros was made aware. Paperwork indicates Dr. Harlow Asa was consulted. Pain is now in right and left shoulder since last night.  Home Medications Prior to Admission medications   Medication Sig Start Date End Date Taking? Authorizing Provider  albuterol (VENTOLIN HFA) 108 (90 Base) MCG/ACT inhaler Inhale 2 puffs into the lungs every 4-6 hours as needed for cough, wheeze, shortness of breath or cough. Patient taking differently: 1 puff every 6 (six) hours as needed for shortness of breath. 01/29/19  Yes Kozlow, Donnamarie Poag, MD  celecoxib (CELEBREX) 200 MG capsule Take 200 mg by mouth daily.   Yes [provider]  cetirizine (ZYRTEC) 10 MG tablet Take 10 mg by mouth daily as needed for allergies.    Yes [provider]  cholecalciferol (VITAMIN D) 25 MCG (1000 UNIT) tablet Take 1,000 Units by mouth daily. 09/21/20  Yes [provider]  Coenzyme Q10 (CO Q-10) 100 MG CAPS Take  1 capsule by mouth daily.   Yes [provider]  Cranberry 450 MG CAPS Take 900 mg by mouth daily.    Yes [provider]  EPINEPHrine (EPIPEN 2-PAK) 0.3 mg/0.3 mL IJ SOAJ injection Inject 0.3 mLs (0.3 mg total) into the muscle once as needed. Reported on 01/20/2015 07/28/17  Yes Kozlow, Donnamarie Poag, MD  ezetimibe (ZETIA) 10 MG tablet Take 1 tablet (10 mg total) by mouth daily. Please keep your upcoming appointment with Dr. Oval Linsey for refills. 12/22/20  Yes Skeet Latch, MD  hydroxychloroquine (PLAQUENIL) 200 MG tablet Take 200 mg by mouth in the morning and at bedtime. 11/27/19  Yes [provider]  levothyroxine (SYNTHROID) 75 MCG tablet Take 75 mcg by mouth daily before breakfast. 12/20/19  Yes [provider]  Magnesium 250 MG TABS Take 1 tablet (250 mg total) by mouth every other day. 12/08/20  Yes Nicholas Lose, MD  Misc Natural Products (OSTEO BI-FLEX ADV TRIPLE ST PO) Take 2 tablets by mouth daily.   Yes [provider]  Omega-3 Fatty Acids (OMEGA-3 FISH OIL) 1200 MG CAPS Take 0.8333 capsules (1,000 mg total) by mouth daily. 12/08/20  Yes Nicholas Lose, MD  pantoprazole (PROTONIX) 20 MG tablet Take 1 tablet (20 mg total) by mouth daily. 04/05/18  Yes Nicholas Lose, MD  Probiotic Product (SOLUBLE FIBER/PROBIOTICS PO) Take 1 capsule by mouth daily.   Yes [provider]  vitamin C (ASCORBIC ACID) 250 MG tablet Take 2 tablets (500 mg total)  by mouth daily. 04/11/19  Yes Nicholas Lose, MD  zinc gluconate 50 MG tablet Take 1 tablet (50 mg total) by mouth daily. 04/11/19  Yes Nicholas Lose, MD  Coenzyme Q10 (COQ10) 100 MG CAPS Take 300 mg by mouth daily. Patient not taking: Reported on 02/19/2021 12/08/20   Nicholas Lose, MD  hydroxychloroquine (PLAQUENIL) 200 MG tablet See admin instructions. Patient not taking: Reported on 02/19/2021    [provider]  pravastatin (PRAVACHOL) 20 MG tablet Take 1 tablet (20 mg total) by mouth daily. Patient  misplaced previous refill, please fill Patient not taking: Reported on 02/19/2021 01/20/21   Loel Dubonnet, NP      Allergies    Bee venom, Cephalexin, Ketorolac tromethamine, Atorvastatin, Crestor [rosuvastatin], and Simvastatin    Review of Systems   Review of Systems  Constitutional:  Negative for fever.  Cardiovascular:  Positive for chest pain.  Gastrointestinal:  Positive for abdominal pain. Negative for vomiting.  Musculoskeletal:  Negative for back pain.   Physical Exam Updated Vital Signs BP (!) 121/55 (BP Location: Left Arm)    Pulse 71    Temp 98 F (36.7 C) (Oral)    Resp 19    SpO2 99%  Physical Exam Vitals and nursing note reviewed.  Constitutional:      General: She is not in acute distress.    Appearance: She is well-developed. She is not ill-appearing or diaphoretic.  HENT:     Head: Normocephalic and atraumatic.  Cardiovascular:     Rate and Rhythm: Normal rate and regular rhythm.     Heart sounds: Normal heart sounds.  Pulmonary:     Effort: Pulmonary effort is normal.     Breath sounds: Normal breath sounds.  Abdominal:     Palpations: Abdomen is soft.     Tenderness: There is abdominal tenderness (mild) in the right upper quadrant. There is no right CVA tenderness or left CVA tenderness.  Skin:    General: Skin is warm and dry.  Neurological:     Mental Status: She is alert.    ED Results / Procedures / Treatments   Labs (all labs ordered are listed, but only abnormal results are displayed) Labs Reviewed  CBC - Abnormal; Notable for the following components:      Result Value   WBC 3.8 (*)    All other components within normal limits  URINALYSIS, ROUTINE W REFLEX MICROSCOPIC - Abnormal; Notable for the following components:   Hgb urine dipstick SMALL (*)    Ketones, ur 5 (*)    Bacteria, UA RARE (*)    All other components within normal limits  RESP PANEL BY RT-PCR (FLU A&B, COVID) ARPGX2  SURGICAL PCR SCREEN  LIPASE, BLOOD   COMPREHENSIVE METABOLIC PANEL    EKG None  Radiology US Abdomen Limited RUQ (LIVER/GB)  Result Date: 02/19/2021 CLINICAL DATA:  Right upper quadrant abdominal pain EXAM: ULTRASOUND ABDOMEN LIMITED RIGHT UPPER QUADRANT COMPARISON:  None. FINDINGS: Gallbladder: Gallstones. Mild wall thickening visualized. Positive sonographic Murphy sign noted by sonographer. Common bile duct: Diameter: 5 mm Liver: No focal lesion identified. Within normal limits in parenchymal echogenicity. Portal vein is patent on color Doppler imaging with normal direction of blood flow towards the liver. Other: None. IMPRESSION: Cholelithiasis with mild gallbladder wall thickening and positive sonographic Murphy sign. No biliary ductal dilatation. Findings are concerning for acute cholecystitis. Electronically Signed   By: Delanna Ahmadi M.D.   On: 02/19/2021 16:36    Procedures Procedures  Medications Ordered in ED Medications  enoxaparin (LOVENOX) injection 40 mg (40 mg Subcutaneous Given 02/19/21 1817)  lactated ringers infusion ( Intravenous New Bag/Given 02/19/21 2259)  ciprofloxacin (CIPRO) IVPB 400 mg (has no administration in time range)  metroNIDAZOLE (FLAGYL) IVPB 500 mg (has no administration in time range)  acetaminophen (TYLENOL) tablet 650 mg (650 mg Oral Patient Refused/Not Given 02/19/21 2347)  oxyCODONE (Oxy IR/ROXICODONE) immediate release tablet 5 mg (has no administration in time range)  oxyCODONE (Oxy IR/ROXICODONE) immediate release tablet 10 mg (has no administration in time range)  HYDROmorphone (DILAUDID) injection 0.5 mg (has no administration in time range)  methocarbamol (ROBAXIN) 500 mg in dextrose 5 % 50 mL IVPB (has no administration in time range)  ondansetron (ZOFRAN) injection 4 mg (has no administration in time range)  prochlorperazine (COMPAZINE) injection 10 mg (has no administration in time range)  simethicone (MYLICON) chewable tablet 80 mg (has no administration in time range)   docusate sodium (COLACE) capsule 100 mg (100 mg Oral Given 02/19/21 2155)  albuterol (PROVENTIL) (2.5 MG/3ML) 0.083% nebulizer solution 2.5 mg (has no administration in time range)  EPINEPHrine (EPI-PEN) injection 0.3 mg (has no administration in time range)  ezetimibe (ZETIA) tablet 10 mg (has no administration in time range)  levothyroxine (SYNTHROID) tablet 75 mcg (has no administration in time range)  pantoprazole (PROTONIX) EC tablet 20 mg (0 mg Oral Hold 02/19/21 1850)  fentaNYL (SUBLIMAZE) injection 50 mcg (50 mcg Intravenous Given 02/19/21 1611)    ED Course/ Medical Decision Making/ A&P                            Patient presents with symptomatic cholelithiasis.  Her ultrasound does show some questionable cholecystitis as well.  Her labs have been reviewed and interpreted and show a mild leukopenia but are otherwise benign.  I have personally reviewed the ultrasound images which do show gallbladder wall thickening and gallstones.  She was given IV fentanyl for pain.  I have discussed with general surgery on-call, Dr. Thermon Leyland, who will admit. Will need cholecystectomy.        Final Clinical Impression(s) / ED Diagnoses Final diagnoses:  RUQ abdominal pain    Rx / DC Orders ED Discharge Orders     None         Sherwood Gambler, MD 02/19/21 2359

## 2021-02-20 ENCOUNTER — Encounter (HOSPITAL_COMMUNITY)
Admission: EM | Disposition: A | Discharge status: Home / Self Care | Payer: Self-pay | Source: Home / Self Care | Attending: Emergency Medicine

## 2021-02-20 ENCOUNTER — Encounter (HOSPITAL_COMMUNITY): Payer: Self-pay

## 2021-02-20 ENCOUNTER — Observation Stay (HOSPITAL_COMMUNITY): Payer: Medicare Other | Admitting: Anesthesiology

## 2021-02-20 DIAGNOSIS — Z79899 Other long term (current) drug therapy: Secondary | ICD-10-CM | POA: Diagnosis not present

## 2021-02-20 DIAGNOSIS — Z923 Personal history of irradiation: Secondary | ICD-10-CM | POA: Diagnosis not present

## 2021-02-20 DIAGNOSIS — E039 Hypothyroidism, unspecified: Secondary | ICD-10-CM | POA: Diagnosis not present

## 2021-02-20 DIAGNOSIS — Z853 Personal history of malignant neoplasm of breast: Secondary | ICD-10-CM | POA: Diagnosis not present

## 2021-02-20 DIAGNOSIS — I2510 Atherosclerotic heart disease of native coronary artery without angina pectoris: Secondary | ICD-10-CM | POA: Diagnosis not present

## 2021-02-20 DIAGNOSIS — Z20822 Contact with and (suspected) exposure to covid-19: Secondary | ICD-10-CM | POA: Diagnosis not present

## 2021-02-20 DIAGNOSIS — K8010 Calculus of gallbladder with chronic cholecystitis without obstruction: Secondary | ICD-10-CM | POA: Diagnosis not present

## 2021-02-20 HISTORY — PX: CHOLECYSTECTOMY: SHX55

## 2021-02-20 SURGERY — LAPAROSCOPIC CHOLECYSTECTOMY
Anesthesia: General | Site: Abdomen

## 2021-02-20 MED ORDER — CHLORHEXIDINE GLUCONATE 0.12 % MT SOLN
OROMUCOSAL | Status: AC
  Administered 2021-02-20: 15 mL via OROMUCOSAL
  Filled 2021-02-20: qty 15

## 2021-02-20 MED ORDER — BUPIVACAINE HCL 0.25 % IJ SOLN
INTRAMUSCULAR | Status: DC | PRN
  Administered 2021-02-20: 7 mL

## 2021-02-20 MED ORDER — FENTANYL CITRATE (PF) 250 MCG/5ML IJ SOLN
INTRAMUSCULAR | Status: AC
  Filled 2021-02-20: qty 5

## 2021-02-20 MED ORDER — ROCURONIUM BROMIDE 10 MG/ML (PF) SYRINGE
PREFILLED_SYRINGE | INTRAVENOUS | Status: AC
  Filled 2021-02-20: qty 10

## 2021-02-20 MED ORDER — AMISULPRIDE (ANTIEMETIC) 5 MG/2ML IV SOLN: 10.0000 mg | Freq: Once | INTRAVENOUS | Status: DC | PRN

## 2021-02-20 MED ORDER — ORAL CARE MOUTH RINSE: 15.0000 mL | Freq: Once | OROMUCOSAL | Status: AC

## 2021-02-20 MED ORDER — ROCURONIUM BROMIDE 10 MG/ML (PF) SYRINGE
PREFILLED_SYRINGE | INTRAVENOUS | Status: DC | PRN
Start: 2021-02-20 — End: 2021-02-20
  Administered 2021-02-20: 50 mg via INTRAVENOUS

## 2021-02-20 MED ORDER — FENTANYL CITRATE (PF) 100 MCG/2ML IJ SOLN
25.0000 ug | INTRAMUSCULAR | Status: DC | PRN
  Administered 2021-02-20 (×3): 50 ug via INTRAVENOUS

## 2021-02-20 MED ORDER — ACETAMINOPHEN 10 MG/ML IV SOLN
1000.0000 mg | Freq: Once | INTRAVENOUS | Status: DC | PRN
  Administered 2021-02-20: 1000 mg via INTRAVENOUS

## 2021-02-20 MED ORDER — FENTANYL CITRATE (PF) 100 MCG/2ML IJ SOLN
INTRAMUSCULAR | Status: AC
  Filled 2021-02-20: qty 2

## 2021-02-20 MED ORDER — LACTATED RINGERS IV SOLN: INTRAVENOUS | Status: DC

## 2021-02-20 MED ORDER — ACETAMINOPHEN 10 MG/ML IV SOLN
INTRAVENOUS | Status: AC
  Filled 2021-02-20: qty 100

## 2021-02-20 MED ORDER — DEXAMETHASONE SODIUM PHOSPHATE 10 MG/ML IJ SOLN
INTRAMUSCULAR | Status: AC
  Filled 2021-02-20: qty 1

## 2021-02-20 MED ORDER — LIDOCAINE 2% (20 MG/ML) 5 ML SYRINGE
INTRAMUSCULAR | Status: DC | PRN
  Administered 2021-02-20: 60 mg via INTRAVENOUS

## 2021-02-20 MED ORDER — HYDROMORPHONE HCL 1 MG/ML IJ SOLN
1.0000 mg | INTRAMUSCULAR | Status: DC | PRN
  Administered 2021-02-20: 1 mg via INTRAVENOUS
  Filled 2021-02-20 (×2): qty 1

## 2021-02-20 MED ORDER — SIMETHICONE 80 MG PO CHEW: 80.0000 mg | CHEWABLE_TABLET | Freq: Four times a day (QID) | ORAL | Status: DC | PRN

## 2021-02-20 MED ORDER — ARTIFICIAL TEARS OPHTHALMIC OINT
TOPICAL_OINTMENT | OPHTHALMIC | Status: AC
  Filled 2021-02-20: qty 3.5

## 2021-02-20 MED ORDER — LIDOCAINE 2% (20 MG/ML) 5 ML SYRINGE
INTRAMUSCULAR | Status: AC
  Filled 2021-02-20: qty 5

## 2021-02-20 MED ORDER — DEXAMETHASONE SODIUM PHOSPHATE 10 MG/ML IJ SOLN
INTRAMUSCULAR | Status: DC | PRN
Start: 2021-02-20 — End: 2021-02-20
  Administered 2021-02-20: 5 mg via INTRAVENOUS

## 2021-02-20 MED ORDER — FENTANYL CITRATE (PF) 250 MCG/5ML IJ SOLN
INTRAMUSCULAR | Status: DC | PRN
  Administered 2021-02-20: 50 ug via INTRAVENOUS
  Administered 2021-02-20: 150 ug via INTRAVENOUS
  Administered 2021-02-20: 50 ug via INTRAVENOUS

## 2021-02-20 MED ORDER — PROPOFOL 10 MG/ML IV BOLUS
INTRAVENOUS | Status: AC
  Filled 2021-02-20: qty 20

## 2021-02-20 MED ORDER — ONDANSETRON HCL 4 MG/2ML IJ SOLN
INTRAMUSCULAR | Status: DC | PRN
  Administered 2021-02-20: 4 mg via INTRAVENOUS

## 2021-02-20 MED ORDER — SODIUM CHLORIDE 0.9 % IR SOLN
Status: DC | PRN
  Administered 2021-02-20: 1000 mL

## 2021-02-20 MED ORDER — ACETAMINOPHEN 325 MG PO TABS: 650.0000 mg | ORAL_TABLET | Freq: Four times a day (QID) | ORAL | Status: DC | PRN

## 2021-02-20 MED ORDER — ONDANSETRON HCL 4 MG/2ML IJ SOLN: 4.0000 mg | Freq: Once | INTRAMUSCULAR | Status: DC | PRN

## 2021-02-20 MED ORDER — PROPOFOL 10 MG/ML IV BOLUS
INTRAVENOUS | Status: DC | PRN
  Administered 2021-02-20: 150 mg via INTRAVENOUS

## 2021-02-20 MED ORDER — 0.9 % SODIUM CHLORIDE (POUR BTL) OPTIME
TOPICAL | Status: DC | PRN
  Administered 2021-02-20: 1000 mL

## 2021-02-20 MED ORDER — SUGAMMADEX SODIUM 200 MG/2ML IV SOLN
INTRAVENOUS | Status: DC | PRN
Start: 2021-02-20 — End: 2021-02-20
  Administered 2021-02-20: 200 mg via INTRAVENOUS

## 2021-02-20 MED ORDER — PHENYLEPHRINE HCL-NACL 20-0.9 MG/250ML-% IV SOLN
INTRAVENOUS | Status: DC | PRN
  Administered 2021-02-20: 25 ug/min via INTRAVENOUS

## 2021-02-20 MED ORDER — OXYCODONE HCL 5 MG PO TABS: 5.0000 mg | ORAL_TABLET | Freq: Four times a day (QID) | ORAL | 0 refills | Status: DC | PRN

## 2021-02-20 MED ORDER — ONDANSETRON HCL 4 MG/2ML IJ SOLN
INTRAMUSCULAR | Status: AC
  Filled 2021-02-20: qty 2

## 2021-02-20 MED ORDER — ACETAMINOPHEN 325 MG PO TABS: 650.0000 mg | ORAL_TABLET | Freq: Four times a day (QID) | ORAL | Status: DC

## 2021-02-20 MED ORDER — CHLORHEXIDINE GLUCONATE 0.12 % MT SOLN: 15.0000 mL | Freq: Once | OROMUCOSAL | Status: AC

## 2021-02-20 SURGICAL SUPPLY — 43 items
BAG COUNTER SPONGE SURGICOUNT (BAG) ×2 IMPLANT
CANISTER SUCT 3000ML PPV (MISCELLANEOUS) ×2 IMPLANT
CHLORAPREP W/TINT 26 (MISCELLANEOUS) ×2 IMPLANT
CLIP LIGATING HEMO O LOK GREEN (MISCELLANEOUS) ×2 IMPLANT
COVER SURGICAL LIGHT HANDLE (MISCELLANEOUS) ×2 IMPLANT
COVER TRANSDUCER ULTRASND (DRAPES) ×1 IMPLANT
DERMABOND ADVANCED (GAUZE/BANDAGES/DRESSINGS) ×1
DERMABOND ADVANCED .7 DNX12 (GAUZE/BANDAGES/DRESSINGS) ×1 IMPLANT
ELECT REM PT RETURN 9FT ADLT (ELECTROSURGICAL) ×2
ELECTRODE REM PT RTRN 9FT ADLT (ELECTROSURGICAL) ×1 IMPLANT
GAUZE 4X4 16PLY ~~LOC~~+RFID DBL (SPONGE) ×1 IMPLANT
GLOVE SURG SYN 7.5  E (GLOVE) ×1
GLOVE SURG SYN 7.5 E (GLOVE) ×1 IMPLANT
GLOVE SURG SYN 7.5 PF PI (GLOVE) ×1 IMPLANT
GOWN STRL REUS W/ TWL LRG LVL3 (GOWN DISPOSABLE) ×2 IMPLANT
GOWN STRL REUS W/ TWL XL LVL3 (GOWN DISPOSABLE) ×1 IMPLANT
GOWN STRL REUS W/TWL LRG LVL3 (GOWN DISPOSABLE) ×2
GOWN STRL REUS W/TWL XL LVL3 (GOWN DISPOSABLE) ×1
GRASPER SUT TROCAR 14GX15 (MISCELLANEOUS) ×2 IMPLANT
KIT BASIN OR (CUSTOM PROCEDURE TRAY) ×2 IMPLANT
KIT TURNOVER KIT B (KITS) ×2 IMPLANT
NDL INSUFFLATION 14GA 120MM (NEEDLE) ×1 IMPLANT
NEEDLE INSUFFLATION 14GA 120MM (NEEDLE) ×2 IMPLANT
NS IRRIG 1000ML POUR BTL (IV SOLUTION) ×2 IMPLANT
PAD ARMBOARD 7.5X6 YLW CONV (MISCELLANEOUS) ×2 IMPLANT
POUCH LAPAROSCOPIC INSTRUMENT (MISCELLANEOUS) ×1 IMPLANT
POUCH RETRIEVAL ECOSAC 10 (ENDOMECHANICALS) IMPLANT
POUCH RETRIEVAL ECOSAC 10MM (ENDOMECHANICALS) ×1
SCISSORS LAP 5X35 DISP (ENDOMECHANICALS) ×2 IMPLANT
SET IRRIG TUBING LAPAROSCOPIC (IRRIGATION / IRRIGATOR) ×2 IMPLANT
SET TUBE SMOKE EVAC HIGH FLOW (TUBING) ×2 IMPLANT
SLEEVE ENDOPATH XCEL 5M (ENDOMECHANICALS) ×2 IMPLANT
SPECIMEN JAR SMALL (MISCELLANEOUS) ×2 IMPLANT
SPONGE T-LAP 18X18 ~~LOC~~+RFID (SPONGE) ×1 IMPLANT
SUT MNCRL AB 4-0 PS2 18 (SUTURE) ×3 IMPLANT
SUT VICRYL 0 UR6 27IN ABS (SUTURE) ×1 IMPLANT
TOWEL GREEN STERILE (TOWEL DISPOSABLE) ×2 IMPLANT
TOWEL GREEN STERILE FF (TOWEL DISPOSABLE) ×1 IMPLANT
TRAY LAPAROSCOPIC MC (CUSTOM PROCEDURE TRAY) ×2 IMPLANT
TROCAR XCEL NON-BLD 11X100MML (ENDOMECHANICALS) ×2 IMPLANT
TROCAR XCEL NON-BLD 5MMX100MML (ENDOMECHANICALS) ×2 IMPLANT
WARMER LAPAROSCOPE (MISCELLANEOUS) ×2 IMPLANT
WATER STERILE IRR 1000ML POUR (IV SOLUTION) ×2 IMPLANT

## 2021-02-20 NOTE — Discharge Instructions (Signed)
[facility] LAPAROSCOPIC SURGERY: POST OP INSTRUCTIONS Always review your discharge instruction sheet given to you by the facility where your surgery was performed. IF YOU HAVE DISABILITY OR FAMILY LEAVE FORMS, YOU MUST BRING THEM TO THE OFFICE FOR PROCESSING.   DO NOT GIVE THEM TO YOUR DOCTOR.  PAIN CONTROL  First take acetaminophen (Tylenol) AND/or ibuprofen (Advil) to control your pain after surgery.  Follow directions on package.  Taking acetaminophen (Tylenol) and/or ibuprofen (Advil) regularly after surgery will help to control your pain and lower the amount of prescription pain medication you may need.  You should not take more than 3,000 mg (3 grams) of acetaminophen (Tylenol) in 24 hours.  You should not take ibuprofen (Advil), aleve, motrin, naprosyn or other NSAIDS if you have a history of stomach ulcers or chronic kidney disease.  A prescription for pain medication may be given to you upon discharge.  Take your pain medication as prescribed, if you still have uncontrolled pain after taking acetaminophen (Tylenol) or ibuprofen (Advil). Use ice packs to help control pain. If you need a refill on your pain medication, please contact your pharmacy.  They will contact our office to request authorization. Prescriptions will not be filled after 5pm or on week-ends.  HOME MEDICATIONS Take your usually prescribed medications unless otherwise directed.  DIET You should follow a light diet the first few days after arrival home.  Be sure to include lots of fluids daily. Avoid fatty, fried foods.   CONSTIPATION It is common to experience some constipation after surgery and if you are taking pain medication.  Increasing fluid intake and taking a stool softener (such as Colace) will usually help or prevent this problem from occurring.  A mild laxative (Milk of Magnesia or Miralax) should be taken according to package instructions if there are no bowel movements after 48  hours.  WOUND/INCISION CARE Most patients will experience some swelling and bruising in the area of the incisions.  Ice packs will help.  Swelling and bruising can take several days to resolve.  Unless discharge instructions indicate otherwise, follow guidelines below  STERI-STRIPS - you may remove your outer bandages 48 hours after surgery, and you may shower at that time.  You have steri-strips (small skin tapes) in place directly over the incision.  These strips should be left on the skin for 7-10 days.   DERMABOND/SKIN GLUE - you may shower in 24 hours.  The glue will flake off over the next 2-3 weeks. Any sutures or staples will be removed at the office during your follow-up visit.  ACTIVITIES You may resume regular (light) daily activities beginning the next day--such as daily self-care, walking, climbing stairs--gradually increasing activities as tolerated.  You may have sexual intercourse when it is comfortable.  Refrain from any heavy lifting or straining until approved by your doctor. You may drive when you are no longer taking prescription pain medication, you can comfortably wear a seatbelt, and you can safely maneuver your car and apply brakes.  FOLLOW-UP You should see your doctor in the office for a follow-up appointment approximately 2-3 weeks after your surgery.  You should have been given your post-op/follow-up appointment when your surgery was scheduled.  If you did not receive a post-op/follow-up appointment, make sure that you call for this appointment within a day or two after you arrive home to insure a convenient appointment time.   WHEN TO CALL YOUR DOCTOR: Fever over 101.0 Inability to urinate Continued bleeding from incision.   Increased pain, redness, or drainage from the incision. Increasing abdominal pain  The clinic staff is available to answer your questions during regular business hours.  Please don't hesitate to call and ask to speak to one of the nurses for  clinical concerns.  If you have a medical emergency, go to the nearest emergency room or call 911.  A surgeon from [facility] is always on call at the hospital. [address], [address], [place], [place]  [address] ? [address] [address], [place], [place]   [address] [phone] ? [phone] ? FAX [phone] Web site: [email or web address]      Managing Your Pain After Surgery Without Opioids    Thank you for participating in our program to help patients manage their pain after surgery without opioids. This is part of our effort to provide you with the best care possible, without exposing you or your family to the risk that opioids pose.  What pain can I expect after surgery? You can expect to have some pain after surgery. This is normal. The pain is typically worse the day after surgery, and quickly begins to get better. Many studies have found that many patients are able to manage their pain after surgery with Over-the-Counter (OTC) medications such as Tylenol and Motrin. If you have a condition that does not allow you to take Tylenol or Motrin, notify your surgical team.  How will I manage my pain? The best strategy for controlling your pain after surgery is around the clock pain control with Tylenol (acetaminophen) and Motrin (ibuprofen or Advil). Alternating these medications with each other allows you to maximize your pain control. In addition to Tylenol and Motrin, you can use heating pads or ice packs on your incisions to help reduce your pain.  How will I alternate your regular strength over-the-counter pain medication? You will take a dose of pain medication every three hours. Start by taking 650 mg of Tylenol (2 pills of 325 mg) 3 hours later take 600 mg of Motrin (3 pills of 200 mg) 3 hours after taking the Motrin take 650 mg of Tylenol 3 hours after that take 600 mg of Motrin.   - 1 -  See example - if your first dose of Tylenol is at 12:00  PM   12:00 PM Tylenol 650 mg (2 pills of 325 mg)  3:00 PM Motrin 600 mg (3 pills of 200 mg)  6:00 PM Tylenol 650 mg (2 pills of 325 mg)  9:00 PM Motrin 600 mg (3 pills of 200 mg)  Continue alternating every 3 hours   We recommend that you follow this schedule around-the-clock for at least 3 days after surgery, or until you feel that it is no longer needed. Use the table on the last page of this handout to keep track of the medications you are taking. Important: Do not take more than 3000mg of Tylenol or 3200mg of Motrin in a 24-hour period. Do not take ibuprofen/Motrin if you have a history of bleeding stomach ulcers, severe kidney disease, &/or actively taking a blood thinner  What if I still have pain? If you have pain that is not controlled with the over-the-counter pain medications (Tylenol and Motrin or Advil) you might have what we call "breakthrough" pain. You will receive a prescription for a small amount of an opioid pain medication such as Oxycodone, Tramadol, or Tylenol with Codeine. Use these opioid pills in the first 24 hours after surgery if you have breakthrough pain. Do   not take more than 1 pill every 4-6 hours.  If you still have uncontrolled pain after using all opioid pills, don't hesitate to call our staff using the number provided. We will help make sure you are managing your pain in the best way possible, and if necessary, we can provide a prescription for additional pain medication.   Day 1    Time  Name of Medication Number of pills taken  Amount of Acetaminophen  Pain Level   Comments  AM PM       AM PM       AM PM       AM PM       AM PM       AM PM       AM PM       AM PM       Total Daily amount of Acetaminophen Do not take more than  3,000 mg per day      Day 2    Time  Name of Medication Number of pills taken  Amount of Acetaminophen  Pain Level   Comments  AM PM       AM PM       AM PM       AM PM       AM PM       AM PM       AM  PM       AM PM       Total Daily amount of Acetaminophen Do not take more than  3,000 mg per day      Day 3    Time  Name of Medication Number of pills taken  Amount of Acetaminophen  Pain Level   Comments  AM PM       AM PM       AM PM       AM PM          AM PM       AM PM       AM PM       AM PM       Total Daily amount of Acetaminophen Do not take more than  3,000 mg per day      Day 4    Time  Name of Medication Number of pills taken  Amount of Acetaminophen  Pain Level   Comments  AM PM       AM PM       AM PM       AM PM       AM PM       AM PM       AM PM       AM PM       Total Daily amount of Acetaminophen Do not take more than  3,000 mg per day      Day 5    Time  Name of Medication Number of pills taken  Amount of Acetaminophen  Pain Level   Comments  AM PM       AM PM       AM PM       AM PM       AM PM       AM PM       AM PM       AM PM       Total Daily amount of Acetaminophen Do not take more than    3,000 mg per day       Day 6    Time  Name of Medication Number of pills taken  Amount of Acetaminophen  Pain Level  Comments  AM PM       AM PM       AM PM       AM PM       AM PM       AM PM       AM PM       AM PM       Total Daily amount of Acetaminophen Do not take more than  3,000 mg per day      Day 7    Time  Name of Medication Number of pills taken  Amount of Acetaminophen  Pain Level   Comments  AM PM       AM PM       AM PM       AM PM       AM PM       AM PM       AM PM       AM PM       Total Daily amount of Acetaminophen Do not take more than  3,000 mg per day        For additional information about how and where to safely dispose of unused opioid medications - https://www.morepowerful[employer].org  Disclaimer: This document contains information and/or instructional materials adapted from [facility] for the typical patient with your condition. It does not replace medical advice  from your health care provider because your experience may differ from that of the typical patient. Talk to your health care provider if you have any questions about this document, your condition or your treatment plan. Adapted from [facility]  

## 2021-02-20 NOTE — Op Note (Signed)
02/20/2021  9:48 AM  PATIENT:  Valerie Snyder  71 y.o. female  PRE-OPERATIVE DIAGNOSIS:  CHOLECYSTITIS  POST-OPERATIVE DIAGNOSIS:  cholecystitis  PROCEDURE:  Procedure(s): LAPAROSCOPIC CHOLECYSTECTOMY (N/A)  SURGEON:  Surgeon(s) and Role:    Ralene Ok, MD - Primary  PHYSICIAN ASSISTANT: Marissa Lumbardo, PA-student   ANESTHESIA:   local and general  EBL:  minimal   BLOOD ADMINISTERED:none  DRAINS: none   LOCAL MEDICATIONS USED:  BUPIVICAINE   SPECIMEN:  Source of Specimen:  gallbladder  DISPOSITION OF SPECIMEN:  PATHOLOGY  COUNTS:  YES  TOURNIQUET:  * No tourniquets in log *  DICTATION: .Dragon Dictation   EBL: <7DS   Complications: none   Counts: reported as correct x 2   Findings:chronically inflamed gallbladder with gallstones  Indications for procedure: Pt is a 3F with RUQ pain and seen to have gallstones and signs of acute cholecystitis on CT.   Details of the procedure:  The patient was taken to the operating and placed in the supine position with bilateral SCDs in place. A time out was called and all facts were verified. A pneumoperitoneum was obtained via A Veress needle technique to a pressure of 21mm of mercury. A 62mm trochar was then placed in the right upper quadrant under visualization, and there were no injuries to any abdominal organs. A 11 mm port was then placed in the umbilical region after infiltrating with local anesthesia under direct visualization. A second epigastric port was placed under direct visualization.   The gallbladder was identified and retracted, the peritoneum was then sharply dissected from the gallbladder and this dissection was carried down to Calot's triangle. The cystic duct was identified and dissected circumferentially and seen going into the gallbladder 360.  The cystic artery was dissected away from the surrounding tissues.   The critical angle was obtained.  2 clips were placed proximally one distally  and the cystic duct transected. The cystic artery was identified and 2 clips placed proximally and one distally and transected. We then proceeded to remove the gallbladder off the hepatic fossa with Bovie cautery. A retrieval bag was then placed in the abdomen and gallbladder placed in the bag. The hepatic fossa was then reexamined and hemostasis was achieved with Bovie cautery and was excellent at this portion of the case. The subhepatic fossa and perihepatic fossa was then irrigated until the effluent was clear. The specimen bag and specimen were removed from the abdominal cavity.  The 11 mm trocar fascia was reapproximated with the Endo Close #1 Vicryl x3. The pneumoperitoneum was evacuated and all trochars removed under direct visulalization. The skin was then closed with 4-0 Monocryl and the skin dressed with Dermabond. The patient was awaken from general anesthesia and taken to the recovery room in stable condition.   PLAN OF CARE: Admit for overnight observation  PATIENT DISPOSITION:  PACU - hemodynamically stable.   Delay start of Pharmacological VTE agent (>24hrs) due to surgical blood loss or risk of bleeding: not applicable

## 2021-02-20 NOTE — Anesthesia Postprocedure Evaluation (Signed)
Anesthesia Post Note  Patient: Valerie Snyder  Procedure(s) Performed: LAPAROSCOPIC CHOLECYSTECTOMY (Abdomen)     Patient location during evaluation: PACU Anesthesia Type: General Level of consciousness: awake Pain management: pain level controlled Vital Signs Assessment: post-procedure vital signs reviewed and stable Respiratory status: spontaneous breathing, nonlabored ventilation, respiratory function stable and patient connected to nasal cannula oxygen Cardiovascular status: blood pressure returned to baseline and stable Postop Assessment: no apparent nausea or vomiting Anesthetic complications: no   No notable events documented.  Last Vitals:  Vitals:   02/20/21 1051 02/20/21 1106  BP: 105/62 110/61  Pulse: 77 78  Resp: 13 16  Temp:  36.7 C  SpO2: 94% 95%    Last Pain:  Vitals:   02/20/21 1500  TempSrc:   PainSc: 10-Worst pain ever                 Lanyia Jewel P Jasmeet Gehl

## 2021-02-20 NOTE — Anesthesia Preprocedure Evaluation (Signed)
Anesthesia Evaluation  Patient identified by MRN, date of birth, ID band Patient awake    Reviewed: Allergy & Precautions, NPO status , Patient's Chart, lab work & pertinent test results  Airway Mallampati: II  TM Distance: >3 FB Neck ROM: Full    Dental no notable dental hx.    Pulmonary asthma ,    Pulmonary exam normal breath sounds clear to auscultation       Cardiovascular + CAD  Normal cardiovascular exam Rhythm:Regular Rate:Normal     Neuro/Psych  Headaches, negative psych ROS   GI/Hepatic Neg liver ROS, GERD  Medicated and Controlled,  Endo/Other  Hypothyroidism   Renal/GU negative Renal ROS     Musculoskeletal negative musculoskeletal ROS (+)   Abdominal   Peds  Hematology negative hematology ROS (+)   Anesthesia Other Findings CHOLECYSTITIS  Reproductive/Obstetrics                             Anesthesia Physical Anesthesia Plan  ASA: 2  Anesthesia Plan: General   Post-op Pain Management:    Induction: Intravenous  PONV Risk Score and Plan: 4 or greater and Ondansetron, Dexamethasone, Propofol infusion, Midazolam and Treatment may vary due to age or medical condition  Airway Management Planned: Oral ETT  Additional Equipment:   Intra-op Plan:   Post-operative Plan: Extubation in OR  Informed Consent: I have reviewed the patients History and Physical, chart, labs and discussed the procedure including the risks, benefits and alternatives for the proposed anesthesia with the patient or authorized representative who has indicated his/her understanding and acceptance.     Dental advisory given  Plan Discussed with: CRNA  Anesthesia Plan Comments:         Anesthesia Quick Evaluation

## 2021-02-20 NOTE — Care Management Obs Status (Signed)
[place] NOTIFICATION   Patient Details  Name: Valerie Snyder MRN: [id number] Date of Birth: March 12, 1950   Medicare Observation Status Notification Given:  Yes    Zenon Mayo, RN 02/20/2021, 4:41 PM

## 2021-02-20 NOTE — Progress Notes (Signed)
° °  Subjective/Chief Complaint: Pt doing well this AM Less pain   Objective: Vital signs in last 24 hours: Temp:  [97.9 F (36.6 C)-98.7 F (37.1 C)] 97.9 F (36.6 C) (01/21 0512) Pulse Rate:  [71-81] 75 (01/21 0512) Resp:  [16-20] 17 (01/21 0512) BP: (97-135)/(45-83) 97/45 (01/21 0512) SpO2:  [97 %-100 %] 99 % (01/21 0512) Last BM Date: 02/19/21  Intake/Output from previous day: No intake/output data recorded. Intake/Output this shift: No intake/output data recorded.  PE:  Constitutional: No acute distress, conversant, appears states age. Eyes: Anicteric sclerae, moist conjunctiva, no lid lag Lungs: Clear to auscultation bilaterally, normal respiratory effort CV: regular rate and rhythm, no murmurs, no peripheral edema, pedal pulses 2+ GI: Soft, no masses or hepatosplenomegaly, tender to palpation RUQ Skin: No rashes, palpation reveals normal turgor Psychiatric: appropriate judgment and insight, oriented to person, place, and time   Lab Results:  Recent Labs    02/19/21 1313  WBC 3.8*  HGB 13.7  HCT 41.6  PLT 202   BMET Recent Labs    02/19/21 1313  NA 140  K 4.0  CL 106  CO2 28  GLUCOSE 97  BUN 9  CREATININE 0.77  CALCIUM 9.4   PT/INR No results for input(s): LABPROT, INR in the last 72 hours. ABG No results for input(s): PHART, HCO3 in the last 72 hours.  Invalid input(s): PCO2, PO2  Studies/Results: US Abdomen Limited RUQ (LIVER/GB)  Result Date: 02/19/2021 CLINICAL DATA:  Right upper quadrant abdominal pain EXAM: ULTRASOUND ABDOMEN LIMITED RIGHT UPPER QUADRANT COMPARISON:  None. FINDINGS: Gallbladder: Gallstones. Mild wall thickening visualized. Positive sonographic Murphy sign noted by sonographer. Common bile duct: Diameter: 5 mm Liver: No focal lesion identified. Within normal limits in parenchymal echogenicity. Portal vein is patent on color Doppler imaging with normal direction of blood flow towards the liver. Other: None. IMPRESSION:  Cholelithiasis with mild gallbladder wall thickening and positive sonographic Murphy sign. No biliary ductal dilatation. Findings are concerning for acute cholecystitis. Electronically Signed   By: Delanna Ahmadi M.D.   On: 02/19/2021 16:36    Anti-infectives: Anti-infectives (From admission, onward)    Start     Dose/Rate Route Frequency Ordered Stop   02/20/21 0000  ciprofloxacin (CIPRO) IVPB 400 mg        400 mg 200 mL/hr over 60 Minutes Intravenous 30 min pre-op 02/19/21 1751     02/20/21 0000  metroNIDAZOLE (FLAGYL) IVPB 500 mg        500 mg 100 mL/hr over 60 Minutes Intravenous 30 min pre-op 02/19/21 1751         Assessment/Plan: 1 F with acute cholecystitis Plan for OR today for lap chole All risks and benefits were discussed with the patient to generally include: infection, bleeding, possible need for post op ERCP, damage to the bile ducts, and bile leak. Alternatives were offered and described.  All questions were answered and the patient voiced understanding of the procedure and wishes to proceed at this point with a laparoscopic cholecystectomy    LOS: 0 days    Ralene Ok 02/20/2021

## 2021-02-20 NOTE — Transfer of Care (Signed)
Immediate Anesthesia Transfer of Care Note  Patient: Valerie Snyder  Procedure(s) Performed: LAPAROSCOPIC CHOLECYSTECTOMY (Abdomen)  Patient Location: PACU  Anesthesia Type:General  Level of Consciousness: drowsy  Airway & Oxygen Therapy: Patient Spontanous Breathing  Post-op Assessment: Report given to RN, Post -op Vital signs reviewed and stable and Patient moving all extremities  Post vital signs: Reviewed and stable  Last Vitals:  Vitals Value Taken Time  BP 132/65 02/20/21 1007  Temp 36.3 C 02/20/21 1007  Pulse 83 02/20/21 1010  Resp 13 02/20/21 1010  SpO2 93 % 02/20/21 1010  Vitals shown include unvalidated device data.  Last Pain:  Vitals:   02/20/21 0805  TempSrc: Oral  PainSc:          Complications: No notable events documented.

## 2021-02-20 NOTE — Discharge Summary (Signed)
[facility] Discharge Summary   Patient ID: Valerie Snyder MRN: [id number] DOB/AGE: 06-03-1950 71 y.o.  Admit date: 02/19/2021 Discharge date: 02/20/2021  Admitting Diagnosis: Cholecystitis   Discharge Diagnosis Patient Active Problem List   Diagnosis Date Noted   Cholecystitis 02/19/2021   CAD in native artery 01/15/2019   Pure hypercholesterolemia 10/10/2018   Genetic testing 12/10/2014   Breast cancer of 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 breast ([place]) 11/13/2014   Allergic rhinoconjunctivitis 10/22/2014   Asthma 10/22/2014   History of basal cell cancer 03/21/2014   Visit for wound check 07/29/2013   Visit for suture removal 07/11/2013   Basal cell carcinoma of nasal tip 07/04/2013   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 FINDINGS: Gallbladder: Gallstones. Mild wall thickening visualized. Positive sonographic Murphy sign noted by sonographer. Common bile duct: Diameter: 5 mm Liver: No focal lesion identified. Within normal limits in parenchymal echogenicity. Portal vein is patent on color Doppler imaging with normal direction of blood flow towards the liver. Other: None. IMPRESSION: Cholelithiasis with mild gallbladder wall thickening and positive sonographic Murphy sign. No biliary ductal dilatation. Findings are concerning for acute cholecystitis. Electronically Signed   By: Delanna Ahmadi M.D.   On: 02/19/2021 16:36    Procedures Dr. Ralene Ok (02/20/21) - Laparoscopic Cholecystectomy   Hospital Course:  Patient is a 71  year old female who presented to [facility] with abdominal pain.  Workup showed  acute cholecystitis.  Patient was admitted and underwent procedure listed above.  Tolerated procedure well and was transferred to the floor.  Diet was advanced as tolerated.  On POD1, the patient was voiding well, tolerating diet, ambulating well, pain well controlled, vital signs stable, incisions c/d/i and felt stable for discharge home.  Patient will follow up in our office in 3-4 weeks and knows to call with questions or concerns.  She will call to confirm appointment date/time.    I or a member of my team have reviewed this patient in the Controlled Substance Database.   Allergies as of 02/20/2021       Reactions   Bee Venom Anaphylaxis   Cephalexin Anaphylaxis   Puffy eyes and lips Other reaction(s): facial swelling   Ketorolac Tromethamine Anaphylaxis   Puffy lips and eyes Other reaction(s): anaphylaxis   Atorvastatin    Other reaction(s): muscle cramps   Crestor [rosuvastatin]    Other reaction(s): muscle cramps   Simvastatin    Other reaction(s): muscle cramps        Medication List     TAKE these medications    acetaminophen 325 MG tablet Commonly known as: TYLENOL Take 2 tablets (650 mg total) by mouth every 6 (six) hours as needed for mild pain or fever.   albuterol 108 (90 Base) MCG/ACT inhaler Commonly known as: Ventolin HFA Inhale 2 puffs into the lungs every 4-6 hours as needed for cough, wheeze, shortness of breath or cough. What changed:  how much to take when to take this reasons to take this additional instructions   celecoxib 200 MG capsule Commonly known as: CELEBREX Take 200  mg by mouth daily.   cetirizine 10 MG tablet Commonly known as: ZYRTEC Take 10 mg by mouth daily as needed for allergies.   cholecalciferol 25 MCG (1000 UNIT) tablet Commonly known as: VITAMIN D Take 1,000 Units by mouth daily.   Co Q-10 100 MG Caps Take 1 capsule by mouth daily.   CoQ10 100  MG Caps Take 300 mg by mouth daily.   Cranberry 450 MG Caps Take 900 mg by mouth daily.   EPINEPHrine 0.3 mg/0.3 mL Soaj injection Commonly known as: EpiPen 2-Pak Inject 0.3 mLs (0.3 mg total) into the muscle once as needed. Reported on 01/20/2015   ezetimibe 10 MG tablet Commonly known as: ZETIA Take 1 tablet (10 mg total) by mouth daily. Please keep your upcoming appointment with Dr. Oval Linsey for refills.   hydroxychloroquine 200 MG tablet Commonly known as: PLAQUENIL See admin instructions.   hydroxychloroquine 200 MG tablet Commonly known as: PLAQUENIL Take 200 mg by mouth in the morning and at bedtime.   levothyroxine 75 MCG tablet Commonly known as: SYNTHROID Take 75 mcg by mouth daily before breakfast.   Magnesium 250 MG Tabs Take 1 tablet (250 mg total) by mouth every other day.   Omega-3 Fish Oil 1200 MG Caps Take 0.8333 capsules (1,000 mg total) by mouth daily.   OSTEO BI-FLEX ADV TRIPLE ST PO Take 2 tablets by mouth daily.   oxyCODONE 5 MG immediate release tablet Commonly known as: Oxy IR/ROXICODONE Take 1-2 tablets (5-10 mg total) by mouth every 6 (six) hours as needed for moderate pain or severe pain.   pantoprazole 20 MG tablet Commonly known as: Protonix Take 1 tablet (20 mg total) by mouth daily.   pravastatin 20 MG tablet Commonly known as: PRAVACHOL Take 1 tablet (20 mg total) by mouth daily. Patient misplaced previous refill, please fill   simethicone 80 MG chewable tablet Commonly known as: MYLICON Chew 1 tablet (80 mg total) by mouth 4 (four) times daily as needed for flatulence.   SOLUBLE FIBER/PROBIOTICS PO Take 1 capsule by mouth daily.   vitamin C 250 MG tablet Commonly known as: ASCORBIC ACID Take 2 tablets (500 mg total) by mouth daily.   zinc gluconate 50 MG tablet Take 1 tablet (50 mg total) by mouth daily.          Follow-up Information     [facility], [place]. Schedule an appointment as soon as possible for a  visit in 3 week(s).   Specialty: General Surgery Why: Our office should be working on scheduling a follow up in 3-4 weeks, please call to confirm appointment date/time. Please arrive 30 min prior to appointment time and have ID and insurance card with you. Contact information: [address] [place] [address] [phone]                 Signed: Norm Parcel , [facility] Surgery 02/20/2021, 10:03 AM Please see Amion for pager number during day hours 7:00am-4:30pm

## 2021-02-20 NOTE — Progress Notes (Signed)
Patient has expressed her concerns of taking hospital medications ie Tylenol and Synthroid due to cost.  She stated she has her own home med supply.  Patient was educated on hospital policy of taking documented medications for her safety and not just taking home meds that are not being documented without MD orders/approval.  Patient would like to speak with MD regarding this matter in the morning.  0600 MD placed order for patient to use her home meds and just document given in Epic.

## 2021-02-20 NOTE — Anesthesia Procedure Notes (Signed)
Procedure Name: Intubation Date/Time: 02/20/2021 9:13 AM Performed by: Kyung Rudd, CRNA Pre-anesthesia Checklist: Patient identified, Emergency Drugs available, Suction available and Patient being monitored Patient Re-evaluated:Patient Re-evaluated prior to induction Oxygen Delivery Method: Circle system utilized Preoxygenation: Pre-oxygenation with 100% oxygen Induction Type: IV induction Ventilation: Mask ventilation without difficulty Laryngoscope Size: Mac and 3 Grade View: Grade I Tube type: Oral Tube size: 7.0 mm Number of attempts: 1 Airway Equipment and Method: Stylet Placement Confirmation: ETT inserted through vocal cords under direct vision, positive ETCO2 and breath sounds checked- equal and bilateral Secured at: 21 cm Tube secured with: Tape Dental Injury: Teeth and Oropharynx as per pre-operative assessment

## 2021-02-21 ENCOUNTER — Encounter (HOSPITAL_COMMUNITY): Payer: Self-pay | Admitting: General Surgery

## 2021-02-21 ENCOUNTER — Other Ambulatory Visit: Payer: Self-pay

## 2021-02-21 DIAGNOSIS — K8010 Calculus of gallbladder with chronic cholecystitis without obstruction: Secondary | ICD-10-CM | POA: Diagnosis not present

## 2021-02-21 MED ORDER — ONDANSETRON HCL 4 MG PO TABS
4.0000 mg | ORAL_TABLET | Freq: Once | ORAL | Status: AC
  Administered 2021-02-21: 4 mg via ORAL
  Filled 2021-02-21: qty 1

## 2021-02-21 NOTE — Progress Notes (Signed)
Patient verbalized "I am feeling ok.  I do not feel sick now.  I can go home now."  Finished giving discharge instructions to patient and her spouse.  Education emphasized on surgical incision site care and signs of infection to watch for.  Both verbalized understanding.  Encouraged to call the doctor for questions/concerns.  Discharged home.

## 2021-02-21 NOTE — Progress Notes (Signed)
Zofran PO once ordered by Dr. Rosendo Gros, ok to send home when feeling ok.

## 2021-02-21 NOTE — Plan of Care (Signed)

## 2021-02-21 NOTE — Progress Notes (Signed)
Received report from night RN that patient has tolerated clear liquid diet overnight.  Advancing diet as tolerated per order.

## 2021-02-21 NOTE — Progress Notes (Addendum)
Discharge instructions are given.  Patient c/o "I am not feeling good, I feel sick...".  Assisted to the bathroom, patient thought she might pass some gas.  Patient was able to void but did not pass gas at this time.  Patient was walking holding on to furnitures, appears weak.  Surgery on call notified - Dr. Rosendo Gros.  Assisted in bed in position of comfort. Needs addressed.  No distress at this time.

## 2021-02-21 NOTE — Progress Notes (Signed)
1 Day Post-Op   Subjective/Chief Complaint: Sore but otherwise fine   Objective: Vital signs in last 24 hours: Temp:  [97.2 F (36.2 C)-98.1 F (36.7 C)] 97.6 F (36.4 C) (01/22 0840) Pulse Rate:  [66-96] 68 (01/22 0840) Resp:  [13-18] 18 (01/22 0840) BP: (91-132)/(41-65) 101/46 (01/22 0840) SpO2:  [94 %-99 %] 97 % (01/22 0840) Last BM Date: 02/19/21  Intake/Output from previous day: 01/21 0701 - 01/22 0700 In: 3287 [P.O.:620; I.V.:2467; IV Piggyback:200] Out: 25 [Blood:25] Intake/Output this shift: No intake/output data recorded.  GI: soft approp tender hematoma at umbilicus incisions otherwise without infection  Lab Results:  Recent Labs    02/19/21 1313  WBC 3.8*  HGB 13.7  HCT 41.6  PLT 202   BMET Recent Labs    02/19/21 1313  NA 140  K 4.0  CL 106  CO2 28  GLUCOSE 97  BUN 9  CREATININE 0.77  CALCIUM 9.4   PT/INR No results for input(s): LABPROT, INR in the last 72 hours. ABG No results for input(s): PHART, HCO3 in the last 72 hours.  Invalid input(s): PCO2, PO2  Studies/Results: US Abdomen Limited RUQ (LIVER/GB)  Result Date: 02/19/2021 CLINICAL DATA:  Right upper quadrant abdominal pain EXAM: ULTRASOUND ABDOMEN LIMITED RIGHT UPPER QUADRANT COMPARISON:  None. FINDINGS: Gallbladder: Gallstones. Mild wall thickening visualized. Positive sonographic Murphy sign noted by sonographer. Common bile duct: Diameter: 5 mm Liver: No focal lesion identified. Within normal limits in parenchymal echogenicity. Portal vein is patent on color Doppler imaging with normal direction of blood flow towards the liver. Other: None. IMPRESSION: Cholelithiasis with mild gallbladder wall thickening and positive sonographic Murphy sign. No biliary ductal dilatation. Findings are concerning for acute cholecystitis. Electronically Signed   By: Delanna Ahmadi M.D.   On: 02/19/2021 16:36    Anti-infectives: Anti-infectives (From admission, onward)    Start     Dose/Rate Route  Frequency Ordered Stop   02/20/21 0000  ciprofloxacin (CIPRO) IVPB 400 mg        400 mg 200 mL/hr over 60 Minutes Intravenous 30 min pre-op 02/19/21 1751 02/20/21 0934   02/20/21 0000  metroNIDAZOLE (FLAGYL) IVPB 500 mg        500 mg 100 mL/hr over 60 Minutes Intravenous 30 min pre-op 02/19/21 1751         Assessment/Plan: POD 1 lap chole -dc home today   Rolm Bookbinder 02/21/2021

## 2021-02-21 NOTE — Plan of Care (Signed)
  Problem: Activity: Goal: Risk for activity intolerance will decrease Outcome: Not Progressing   Problem: Coping: Goal: Level of anxiety will decrease Outcome: Not Progressing   Problem: Pain Managment: Goal: General experience of comfort will improve Outcome: Not Progressing   

## 2021-02-23 ENCOUNTER — Encounter: Payer: Self-pay | Admitting: Podiatry

## 2021-02-23 ENCOUNTER — Encounter (HOSPITAL_BASED_OUTPATIENT_CLINIC_OR_DEPARTMENT_OTHER): Payer: Self-pay | Admitting: Cardiovascular Disease

## 2021-02-23 LAB — SURGICAL PATHOLOGY

## 2021-02-23 NOTE — Telephone Encounter (Signed)
Please advise 

## 2021-03-02 ENCOUNTER — Ambulatory Visit: Payer: Medicare Other | Admitting: Allergy and Immunology

## 2021-03-02 ENCOUNTER — Encounter: Payer: Self-pay | Admitting: Podiatry

## 2021-03-03 ENCOUNTER — Encounter: Payer: Self-pay | Admitting: Podiatry

## 2021-03-04 ENCOUNTER — Encounter: Payer: Self-pay | Admitting: Podiatry

## 2021-03-05 ENCOUNTER — Encounter (HOSPITAL_BASED_OUTPATIENT_CLINIC_OR_DEPARTMENT_OTHER): Payer: Self-pay

## 2021-03-05 ENCOUNTER — Emergency Department (HOSPITAL_BASED_OUTPATIENT_CLINIC_OR_DEPARTMENT_OTHER): Payer: Medicare Other

## 2021-03-05 ENCOUNTER — Other Ambulatory Visit: Payer: Self-pay

## 2021-03-05 ENCOUNTER — Emergency Department (HOSPITAL_BASED_OUTPATIENT_CLINIC_OR_DEPARTMENT_OTHER)
Admission: EM | Admit: 2021-03-05 | Discharge: 2021-03-05 | Disposition: A | Discharge status: Home / Self Care | Payer: Medicare Other | Attending: Emergency Medicine | Admitting: Emergency Medicine

## 2021-03-05 DIAGNOSIS — I2510 Atherosclerotic heart disease of native coronary artery without angina pectoris: Secondary | ICD-10-CM | POA: Insufficient documentation

## 2021-03-05 DIAGNOSIS — Z79899 Other long term (current) drug therapy: Secondary | ICD-10-CM | POA: Diagnosis not present

## 2021-03-05 DIAGNOSIS — K2900 Acute gastritis without bleeding: Secondary | ICD-10-CM | POA: Diagnosis not present

## 2021-03-05 DIAGNOSIS — R0789 Other chest pain: Secondary | ICD-10-CM | POA: Diagnosis not present

## 2021-03-05 DIAGNOSIS — R1013 Epigastric pain: Secondary | ICD-10-CM | POA: Diagnosis present

## 2021-03-05 DIAGNOSIS — R079 Chest pain, unspecified: Secondary | ICD-10-CM | POA: Diagnosis not present

## 2021-03-05 DIAGNOSIS — R1084 Generalized abdominal pain: Secondary | ICD-10-CM | POA: Insufficient documentation

## 2021-03-05 DIAGNOSIS — E039 Hypothyroidism, unspecified: Secondary | ICD-10-CM | POA: Diagnosis not present

## 2021-03-05 DIAGNOSIS — I700 Atherosclerosis of aorta: Secondary | ICD-10-CM | POA: Diagnosis not present

## 2021-03-05 DIAGNOSIS — R109 Unspecified abdominal pain: Secondary | ICD-10-CM | POA: Diagnosis not present

## 2021-03-05 LAB — CBC
HCT: 40 % (ref 36.0–46.0)
Hemoglobin: 13.7 g/dL (ref 12.0–15.0)
MCH: 31.2 pg (ref 26.0–34.0)
MCHC: 34.3 g/dL (ref 30.0–36.0)
MCV: 91.1 fL (ref 80.0–100.0)
Platelets: 232 10*3/uL (ref 150–400)
RBC: 4.39 MIL/uL (ref 3.87–5.11)
RDW: 11.9 % (ref 11.5–15.5)
WBC: 5 10*3/uL (ref 4.0–10.5)
nRBC: 0 % (ref 0.0–0.2)

## 2021-03-05 LAB — URINALYSIS, ROUTINE W REFLEX MICROSCOPIC
Bilirubin Urine: NEGATIVE
Glucose, UA: NEGATIVE mg/dL
Ketones, ur: 15 mg/dL — AB
Leukocytes,Ua: NEGATIVE
Nitrite: NEGATIVE
Protein, ur: NEGATIVE mg/dL
Specific Gravity, Urine: <=1.005 (ref 1.005–1.030)
pH: 5.5 (ref 5.0–8.0)

## 2021-03-05 LAB — URINALYSIS, MICROSCOPIC (REFLEX)

## 2021-03-05 LAB — HEPATIC FUNCTION PANEL
ALT: 23 U/L (ref 0–44)
AST: 21 U/L (ref 15–41)
Albumin: 4.2 g/dL (ref 3.5–5.0)
Alkaline Phosphatase: 61 U/L (ref 38–126)
Bilirubin, Direct: 0.2 mg/dL (ref 0.0–0.2)
Indirect Bilirubin: 0.8 mg/dL (ref 0.3–0.9)
Total Bilirubin: 1 mg/dL (ref 0.3–1.2)
Total Protein: 7.5 g/dL (ref 6.5–8.1)

## 2021-03-05 LAB — BASIC METABOLIC PANEL
Anion gap: 10 (ref 5–15)
BUN: 16 mg/dL (ref 8–23)
CO2: 26 mmol/L (ref 22–32)
Calcium: 9.5 mg/dL (ref 8.9–10.3)
Chloride: 102 mmol/L (ref 98–111)
Creatinine, Ser: 0.8 mg/dL (ref 0.44–1.00)
GFR, Estimated: >60 mL/min (ref >60)
Glucose, Bld: 93 mg/dL (ref 70–99)
Potassium: 4.1 mmol/L (ref 3.5–5.1)
Sodium: 138 mmol/L (ref 135–145)

## 2021-03-05 LAB — TROPONIN I (HIGH SENSITIVITY)
Troponin I (High Sensitivity): 2 ng/L (ref <18)
Troponin I (High Sensitivity): 2 ng/L (ref <18)

## 2021-03-05 LAB — LIPASE, BLOOD: Lipase: 49 U/L (ref 11–51)

## 2021-03-05 MED ORDER — ALUM & MAG HYDROXIDE-SIMETH 200-200-20 MG/5ML PO SUSP
30.0000 mL | Freq: Once | ORAL | Status: AC
  Administered 2021-03-05: 30 mL via ORAL
  Filled 2021-03-05: qty 30

## 2021-03-05 MED ORDER — SUCRALFATE 1 GM/10ML PO SUSP: 1.0000 g | Freq: Three times a day (TID) | ORAL | 0 refills | Status: DC

## 2021-03-05 MED ORDER — PANTOPRAZOLE SODIUM 40 MG PO TBEC: 40.0000 mg | DELAYED_RELEASE_TABLET | Freq: Every day | ORAL | 0 refills | Status: DC

## 2021-03-05 MED ORDER — LIDOCAINE VISCOUS HCL 2 % MT SOLN
15.0000 mL | Freq: Once | OROMUCOSAL | Status: AC
  Administered 2021-03-05: 15 mL via ORAL
  Filled 2021-03-05: qty 15

## 2021-03-05 MED ORDER — IOHEXOL 300 MG/ML  SOLN
100.0000 mL | Freq: Once | INTRAMUSCULAR | Status: AC | PRN
  Administered 2021-03-05: 100 mL via INTRAVENOUS

## 2021-03-05 NOTE — ED Triage Notes (Signed)
Pt arrives with reports of CP starting yesterday states that she recently had her gallbladder removed. Pt states that she has had some bloating from sternum to pubic bone. Also c/o epigastric pain and pain with breathing. States that she has been using gasex with meals but states it has not helped. Pain radiates into left back. Does have history of GERD. Last BM today. Triage nurse advised she go to [facility] r/t history of CAD.

## 2021-03-05 NOTE — Discharge Instructions (Signed)
You were seen in the ER today for your abdominal pain. Your workup was very reassuring. You are likely experiencing a combination of pain from your recent surgery as well as gastritis. You have been prescribed a higher dose of pantoprazole to take for the next 2 weeks.  You been prescribed Carafate which you may take to get into the lining of your stomach.  Please follow-up with your surgeon as previously scheduled.  Return the ER for develop any fever, nausea or vomiting that does not stop, or any other new severe symptom.

## 2021-03-05 NOTE — ED Notes (Signed)
Pt reports CP worsens after eating. Pt reports taking extra acid reducer last night and pain was alleviated 50m after.

## 2021-03-05 NOTE — ED Provider Notes (Signed)
[facility] EMERGENCY DEPARTMENT Provider Note   CSN: [id number] Arrival date & time: 03/05/21  1537     History  Chief Complaint  Patient presents with   Chest Pain    Valerie Snyder is a 71 y.o. female who presents with concern for epigastric pain that started recently.  She is 2-week status post a cholecystectomy for acute cholecystitis and states that she has had some abdominal bloating and generalized abdominal pain since that time worsened with deep inspiration.  She does have a history of gastritis on pantoprazole 20 mg daily.  Pain not improved with Gas-X.  Does not like to take oral pain medication at home.  Last BM today which was normal.  No melena, hematochezia, nausea or vomiting.  No fevers or chills  I personally read this patient's medical records.  She is history of coronary artery disease, hypothyroidism, hypercholesterolemia, migraines.  She is not anticoagulated .Marland Kitchen  Additionally has history of severe osteoarthritis.   HPI     Home Medications Prior to Admission medications   Medication Sig Start Date End Date Taking? Authorizing Provider  acetaminophen (TYLENOL) 325 MG tablet Take 2 tablets (650 mg total) by mouth every 6 (six) hours as needed for mild pain or fever. 02/20/21   Norm Parcel, PA-C  albuterol (VENTOLIN HFA) 108 (90 Base) MCG/ACT inhaler Inhale 2 puffs into the lungs every 4-6 hours as needed for cough, wheeze, shortness of breath or cough. Patient taking differently: 1 puff every 6 (six) hours as needed for shortness of breath. 01/29/19   Kozlow, Donnamarie Poag, MD  celecoxib (CELEBREX) 200 MG capsule Take 200 mg by mouth daily.    [provider]  cetirizine (ZYRTEC) 10 MG tablet Take 10 mg by mouth daily as needed for allergies.     [provider]  cholecalciferol (VITAMIN D) 25 MCG (1000 UNIT) tablet Take 1,000 Units by mouth daily. 09/21/20   [provider]  Coenzyme Q10 (CO Q-10) 100 MG CAPS Take 1 capsule by  mouth daily.    [provider]  Coenzyme Q10 (COQ10) 100 MG CAPS Take 300 mg by mouth daily. Patient not taking: Reported on 02/19/2021 12/08/20   Nicholas Lose, MD  Cranberry 450 MG CAPS Take 900 mg by mouth daily.     [provider]  EPINEPHrine (EPIPEN 2-PAK) 0.3 mg/0.3 mL IJ SOAJ injection Inject 0.3 mLs (0.3 mg total) into the muscle once as needed. Reported on 01/20/2015 07/28/17   Jiles Prows, MD  ezetimibe (ZETIA) 10 MG tablet Take 1 tablet (10 mg total) by mouth daily. Please keep your upcoming appointment with Dr. Oval Linsey for refills. 12/22/20   Skeet Latch, MD  hydroxychloroquine (PLAQUENIL) 200 MG tablet Take 200 mg by mouth in the morning and at bedtime. 11/27/19   [provider]  hydroxychloroquine (PLAQUENIL) 200 MG tablet See admin instructions. Patient not taking: Reported on 02/19/2021    [provider]  levothyroxine (SYNTHROID) 75 MCG tablet Take 75 mcg by mouth daily before breakfast. 12/20/19   [provider]  Magnesium 250 MG TABS Take 1 tablet (250 mg total) by mouth every other day. 12/08/20   Nicholas Lose, MD  Misc Natural Products (OSTEO BI-FLEX ADV TRIPLE ST PO) Take 2 tablets by mouth daily.    [provider]  Omega-3 Fatty Acids (OMEGA-3 FISH OIL) 1200 MG CAPS Take 0.8333 capsules (1,000 mg total) by mouth daily. 12/08/20   Nicholas Lose, MD  oxyCODONE (OXY IR/ROXICODONE) 5 MG  immediate release tablet Take 1-2 tablets (5-10 mg total) by mouth every 6 (six) hours as needed for moderate pain or severe pain. 02/20/21   Norm Parcel, PA-C  pantoprazole (PROTONIX) 20 MG tablet Take 1 tablet (20 mg total) by mouth daily. 04/05/18   Nicholas Lose, MD  pravastatin (PRAVACHOL) 20 MG tablet Take 1 tablet (20 mg total) by mouth daily. Patient misplaced previous refill, please fill Patient not taking: Reported on 02/19/2021 01/20/21   Loel Dubonnet, NP  Probiotic Product (SOLUBLE FIBER/PROBIOTICS PO) Take 1  capsule by mouth daily.    [provider]  simethicone (MYLICON) 80 MG chewable tablet Chew 1 tablet (80 mg total) by mouth 4 (four) times daily as needed for flatulence. 02/20/21   Norm Parcel, PA-C  vitamin C (ASCORBIC ACID) 250 MG tablet Take 2 tablets (500 mg total) by mouth daily. 04/11/19   Nicholas Lose, MD  zinc gluconate 50 MG tablet Take 1 tablet (50 mg total) by mouth daily. 04/11/19   Nicholas Lose, MD      Allergies    Bee venom, Cephalexin, Ketorolac tromethamine, Atorvastatin, Crestor [rosuvastatin], and Simvastatin    Review of Systems   Review of Systems  Constitutional:  Positive for appetite change. Negative for activity change, chills, fatigue and fever.  HENT: Negative.    Respiratory: Negative.    Cardiovascular: Negative.   Gastrointestinal:  Positive for abdominal pain. Negative for blood in stool, constipation, diarrhea, nausea and vomiting.  Genitourinary: Negative.   Musculoskeletal:  Positive for back pain. Negative for myalgias, neck pain and neck stiffness.  Skin: Negative.   Neurological: Negative.    Physical Exam Updated Vital Signs BP (!) 105/57    Pulse 72    Temp 98.6 F (37 C) (Oral)    Resp 18    Ht 5\' 5"  (1.651 m)    Wt 65.8 kg    SpO2 96%    BMI 24.13 kg/m  Physical Exam Vitals and nursing note reviewed.  Constitutional:      Appearance: She is not ill-appearing or toxic-appearing.  HENT:     Head: Normocephalic and atraumatic.     Nose: Nose normal.     Mouth/Throat:     Mouth: Mucous membranes are moist.     Pharynx: No oropharyngeal exudate or posterior oropharyngeal erythema.  Eyes:     General:        Right eye: No discharge.        Left eye: No discharge.     Extraocular Movements: Extraocular movements intact.     Conjunctiva/sclera: Conjunctivae normal.     Pupils: Pupils are equal, round, and reactive to light.  Cardiovascular:     Rate and Rhythm: Normal rate and regular rhythm.     Pulses: Normal pulses.      Heart sounds: Normal heart sounds.  Pulmonary:     Effort: Pulmonary effort is normal. No tachypnea, accessory muscle usage, prolonged expiration or respiratory distress.     Breath sounds: Normal breath sounds. No wheezing or rales.  Abdominal:     General: Bowel sounds are normal. There is no distension.     Palpations: Abdomen is soft.     Tenderness: There is generalized abdominal tenderness and tenderness in the epigastric area. There is no right CVA tenderness, left CVA tenderness, guarding or rebound.  Musculoskeletal:        General: No deformity.     Cervical back: Neck supple.  Skin:    General:  Skin is warm and dry.     Capillary Refill: Capillary refill takes less than 2 seconds.  Neurological:     General: No focal deficit present.     Mental Status: She is alert and oriented to person, place, and time. Mental status is at baseline.  Psychiatric:        Mood and Affect: Mood normal.    ED Results / Procedures / Treatments   Labs (all labs ordered are listed, but only abnormal results are displayed) Labs Reviewed  BASIC METABOLIC PANEL  CBC  TROPONIN I (HIGH SENSITIVITY)  TROPONIN I (HIGH SENSITIVITY)    EKG None  Radiology DG Chest 2 View  Result Date: 03/05/2021 CLINICAL DATA:  Chest pain.  Recent cholecystectomy. EXAM: CHEST - 2 VIEW COMPARISON:  Chest x-ray dated 08/12/2018 FINDINGS: Heart size and mediastinal contours are within normal limits. Lungs are clear. No pleural effusion or pneumothorax is seen. Osseous structures about the chest are unremarkable. IMPRESSION: No active cardiopulmonary disease. Electronically Signed   By: Franki Cabot M.D.   On: 03/05/2021 16:23    Procedures Procedures    Medications Ordered in ED Medications - No data to display  ED Course/ Medical Decision Making/ A&P                           Medical Decision Making Valerie Snyder is a 71 year old female presents with concern for epigastric pain 2 weeks following  cholecystectomy.  Mildly hypertensive on intake, versus additional.  Cardiopulmonary exam is normal, abdominal exam with epigastric tenderness palpation and generalized mild tenderness to palpation.  No CVAT.  Patient is well-appearing, tolerating p.o. in the emergency department and neurovascular intact in all 4 extremities.   Amount and/or Complexity of Data Reviewed Labs: ordered.    Details: CBC unremarkable, BMP and hepatic function panel is unremarkable.  Lipase is normal.  Troponin is negative, 2.  UA without signs concerning for infection. Radiology: ordered.    Details: CT of the abdomen pelvis with expected postoperative changes but no evidence of infection, focal fluid collection, or biliary dilatation.  Chest x-ray is negative for acute cardiopulmonary disease. ECG/medicine tests:     Details: EKG with normal sinus rhythm  Risk OTC drugs. Prescription drug management.   Patient administered GI cocktail after reassuring work-up with complete resolution of epigastric pain.  Patient with history of gastritis, suspect acute exacerbation of her gastritis following her recent surgical procedure.  We will increase her pantoprazole to 40 mg a day for the next 2 weeks and then recommend she proceed with previously prescribed 20 mg daily.  Additionally will provide Carafate prescription as needed.  Recommend close follow-up with her surgeon as previously scheduled.  Valerie Snyder voiced understanding for medical evaluation and treatment plan.  No further work-up is warranted near this time given reassuring physical exam and work-up.  Suspect gastritis as patient's etiology of symptoms.  Each of the patient's questions was answered to her expressed satisfaction.  Return precautions were given.  Patient is well-appearing, stable, and was discharged in good condition.  This chart was dictated using voice recognition software, Dragon. Despite the best efforts of this provider to proofread and correct  errors, errors may still occur which can change documentation meaning.    Final Clinical Impression(s) / ED Diagnoses Final diagnoses:  None    Rx / DC Orders ED Discharge Orders     None         Osha Errico, Eugene Garnet  R, PA-C 03/06/21 0012    Lorelle Gibbs, DO 03/08/21 1541

## 2021-03-05 NOTE — ED Notes (Signed)
Pt returned from CT scan, ambulatory to restroom, tolerated well.

## 2021-03-12 ENCOUNTER — Ambulatory Visit (HOSPITAL_BASED_OUTPATIENT_CLINIC_OR_DEPARTMENT_OTHER): Payer: Medicare Other

## 2021-03-12 DIAGNOSIS — R109 Unspecified abdominal pain: Secondary | ICD-10-CM | POA: Diagnosis not present

## 2021-03-12 DIAGNOSIS — R140 Abdominal distension (gaseous): Secondary | ICD-10-CM | POA: Diagnosis not present

## 2021-03-15 DIAGNOSIS — R140 Abdominal distension (gaseous): Secondary | ICD-10-CM | POA: Diagnosis not present

## 2021-03-25 DIAGNOSIS — M79674 Pain in right toe(s): Secondary | ICD-10-CM | POA: Diagnosis not present

## 2021-03-25 DIAGNOSIS — E039 Hypothyroidism, unspecified: Secondary | ICD-10-CM | POA: Diagnosis not present

## 2021-03-25 DIAGNOSIS — Z79899 Other long term (current) drug therapy: Secondary | ICD-10-CM | POA: Diagnosis not present

## 2021-03-25 DIAGNOSIS — E559 Vitamin D deficiency, unspecified: Secondary | ICD-10-CM | POA: Diagnosis not present

## 2021-03-25 DIAGNOSIS — E7800 Pure hypercholesterolemia, unspecified: Secondary | ICD-10-CM | POA: Diagnosis not present

## 2021-03-29 DIAGNOSIS — Z20822 Contact with and (suspected) exposure to covid-19: Secondary | ICD-10-CM | POA: Diagnosis not present

## 2021-03-30 ENCOUNTER — Ambulatory Visit (INDEPENDENT_AMBULATORY_CARE_PROVIDER_SITE_OTHER): Payer: Medicare Other | Admitting: Allergy and Immunology

## 2021-03-30 ENCOUNTER — Encounter: Payer: Self-pay | Admitting: Allergy and Immunology

## 2021-03-30 ENCOUNTER — Other Ambulatory Visit: Payer: Self-pay

## 2021-03-30 VITALS — BP 112/70 | HR 75 | Resp 18 | Ht 65.0 in | Wt 146.0 lb

## 2021-03-30 DIAGNOSIS — J4520 Mild intermittent asthma, uncomplicated: Secondary | ICD-10-CM

## 2021-03-30 DIAGNOSIS — J3089 Other allergic rhinitis: Secondary | ICD-10-CM | POA: Diagnosis not present

## 2021-03-30 DIAGNOSIS — K219 Gastro-esophageal reflux disease without esophagitis: Secondary | ICD-10-CM

## 2021-03-30 DIAGNOSIS — K915 Postcholecystectomy syndrome: Secondary | ICD-10-CM

## 2021-03-30 DIAGNOSIS — Z91038 Other insect allergy status: Secondary | ICD-10-CM

## 2021-03-30 MED ORDER — EPINEPHRINE 0.3 MG/0.3ML IJ SOAJ
0.3000 mg | INTRAMUSCULAR | 1 refills | Status: DC | PRN
Start: 2021-03-30 — End: 2023-05-16

## 2021-03-30 MED ORDER — VENTOLIN HFA 108 (90 BASE) MCG/ACT IN AERS: 2.0000 | INHALATION_SPRAY | RESPIRATORY_TRACT | 1 refills | Status: DC | PRN

## 2021-03-30 MED ORDER — COLESTIPOL HCL 1 G PO TABS: 1.0000 g | ORAL_TABLET | Freq: Every day | ORAL | 5 refills | Status: DC

## 2021-03-30 NOTE — Patient Instructions (Addendum)
°  1. Continue the following if needed:   A. Zyrtec 10 mg one tablet once a day  B. Mucinex  C. Nasal saline  D. Ventolin HFA 2 puffs every 4-6 hours  E. EpiPen  2. Can try colestipol 1 g - 1 tablet 1 time per day. Does this help bloating?  3. Return to clinic in 1 year or earlier if problem

## 2021-03-30 NOTE — Progress Notes (Signed)
[facility] [place] [facility] [place]   Follow-up Note  Referring Provider: Vernie Shanks, MD Primary Provider: Vernie Shanks, MD Date of Office Visit: 03/30/2021  Subjective:   Valerie Snyder (DOB: 22-Oct-1950) is a 71 y.o. female who returns to the [facility] on 03/30/2021 in re-evaluation of the following:  HPI: Valerie Snyder returns to this clinic in evaluation of allergic rhinoconjunctivitis, asthma, LPR, and history of hymenoptera venom hypersensitivity state.  Her last visit to this clinic was 21 January 2020.  She is doing well from an airway standpoint and rarely uses any antihistamine and rarely uses any short acting bronchodilator.  In fact, it has been years since she has used a short acting bronchodilator.  She did visit with Dr. Benjamine Snyder, ENT, concerning some nasal discharge and he treated her with topical Bactroban which has not really helped this issue.  She gets this thin and stringy nasal discharge in both nostrils at this point.  She has been using some nasal gel which she does not really like.  She is now using Protonix on a consistent basis which is working quite well.  She has an EpiPen for her hymenoptera venom hypersensitivity.  4 weeks ago she had a cholecystectomy and ever since then she has been having abdominal bloating.  She was given Creon over the course of the past 3 days which may have helped this issue somewhat.  She has also had some sternal chest pain since that issue.  Apparently she is going to be sent to [facility] for a "breath test".  Allergies as of 03/30/2021       Reactions   Bee Venom Anaphylaxis   Cephalexin Anaphylaxis   Puffy eyes and lips Other reaction(s): facial swelling   Ketorolac Tromethamine Anaphylaxis   Puffy lips and eyes Other reaction(s): anaphylaxis   Atorvastatin    Other reaction(s): muscle cramps   Crestor [rosuvastatin]    Other reaction(s): muscle cramps   Simvastatin    Other  reaction(s): muscle cramps        Medication List    acetaminophen 325 MG tablet Commonly known as: TYLENOL Take 2 tablets (650 mg total) by mouth every 6 (six) hours as needed for mild pain or fever.   albuterol 108 (90 Base) MCG/ACT inhaler Commonly known as: Ventolin HFA Inhale 2 puffs into the lungs every 4-6 hours as needed for cough, wheeze, shortness of breath or cough.   celecoxib 200 MG capsule Commonly known as: CELEBREX Take 200 mg by mouth daily.   cetirizine 10 MG tablet Commonly known as: ZYRTEC Take 10 mg by mouth daily as needed for allergies.   cholecalciferol 25 MCG (1000 UNIT) tablet Commonly known as: VITAMIN D Take 1,000 Units by mouth daily.   Co Q-10 100 MG Caps Take 1 capsule by mouth daily.   CoQ10 100 MG Caps Take 300 mg by mouth daily.   Cranberry 450 MG Caps Take 900 mg by mouth daily.   EPINEPHrine 0.3 mg/0.3 mL Soaj injection Commonly known as: EpiPen 2-Pak Inject 0.3 mLs (0.3 mg total) into the muscle once as needed. Reported on 01/20/2015   ezetimibe 10 MG tablet Commonly known as: ZETIA Take 1 tablet (10 mg total) by mouth daily. Please keep your upcoming appointment with Valerie Snyder for refills.   hydroxychloroquine 200 MG tablet Commonly known as: PLAQUENIL See admin instructions.   hydroxychloroquine 200 MG tablet Commonly known as: PLAQUENIL Take 200 mg by mouth in the  morning and at bedtime.   levothyroxine 75 MCG tablet Commonly known as: SYNTHROID Take 75 mcg by mouth daily before breakfast.   Magnesium 250 MG Tabs Take 1 tablet (250 mg total) by mouth every other day.   Omega-3 Fish Oil 1200 MG Caps Take 0.8333 capsules (1,000 mg total) by mouth daily.   OSTEO BI-FLEX ADV TRIPLE ST PO Take 2 tablets by mouth daily.   oxyCODONE 5 MG immediate release tablet Commonly known as: Oxy IR/ROXICODONE Take 1-2 tablets (5-10 mg total) by mouth every 6 (six) hours as needed for moderate pain or severe pain.    pantoprazole 20 MG tablet Commonly known as: Protonix Take 1 tablet (20 mg total) by mouth daily.   pantoprazole 40 MG tablet Commonly known as: Protonix Take 1 tablet (40 mg total) by mouth daily for 14 days.   pravastatin 20 MG tablet Commonly known as: PRAVACHOL Take 1 tablet (20 mg total) by mouth daily. Patient misplaced previous refill, please fill   simethicone 80 MG chewable tablet Commonly known as: MYLICON Chew 1 tablet (80 mg total) by mouth 4 (four) times daily as needed for flatulence.   SOLUBLE FIBER/PROBIOTICS PO Take 1 capsule by mouth daily.   sucralfate 1 GM/10ML suspension Commonly known as: Carafate Take 10 mLs (1 g total) by mouth 4 (four) times daily -  with meals and at bedtime.   vitamin C 250 MG tablet Commonly known as: ASCORBIC ACID Take 2 tablets (500 mg total) by mouth daily.   zinc gluconate 50 MG tablet Take 1 tablet (50 mg total) by mouth daily.    Past Medical History:  Diagnosis Date   Abnormal uterine bleeding (AUB)    Allergy    Asthma    allergy related   Breast cancer of 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 breast ([place]) 11/13/2014   CAD in native artery 01/15/2019   Asymptomatic coronary calcification on coronary calcium score.  72nd percentile 11/2018.   Complication of anesthesia    10 years ago- pt states she stopped breathing and "had to be bagged"   Concussion 2018   GERD (gastroesophageal reflux disease)    H/O bilateral inguinal hernia repair 1992   Hypercholesteremia    Hypothyroidism    Migraines    Personal history of radiation therapy    Plantar fasciitis, bilateral    Pure hypercholesterolemia 10/10/2018   Radiation 01/12/15-02/04/15   right breast 42.17 gray   Spinal stenosis    Thyroid disease     Past Surgical History:  Procedure Laterality Date   BLADDER SUSPENSION     BLEPHAROPLASTY Bilateral    BREAST LUMPECTOMY Right 12/10/2014   BREAST LUMPECTOMY WITH RADIOACTIVE SEED LOCALIZATION Right 12/10/2014    Procedure: RIGHT BREAST LUMPECTOMY WITH RADIOACTIVE SEED LOCALIZATION;  Surgeon: Valerie Messing III, MD;  Location: [facility];  Service: General;  Laterality: Right;   CHOLECYSTECTOMY N/A 02/20/2021   Procedure: LAPAROSCOPIC CHOLECYSTECTOMY;  Surgeon: Valerie Ok, MD;  Location: [facility];  Service: General;  Laterality: N/A;   [place] N/A 07/15/2020   Procedure: DILATATION & CURETTAGE/HYSTEROSCOPY Everlene Balls USING MYOSURE;  Surgeon: Christophe Louis, MD;  Location: [facility];  Service: Gynecology;  Laterality: N/A;   FOOT SURGERY     plantar fashiectomies from both feet   HERNIA REPAIR     HYSTEROSCOPY WITH D & C N/A 12/27/2017   Procedure: DILATATION AND CURETTAGE /HYSTEROSCOPY;  Surgeon: Janyth Pupa, DO;  Location: [facility];  Service:  Gynecology;  Laterality: N/A;   NASAL SEPTUM SURGERY     neuromas removed from right foot     osteoma  1991   removal from right orbital   POLYPECTOMY  12/27/2017   Procedure: POLYPECTOMY;  Surgeon: Janyth Pupa, DO;  Location: [facility];  Service: Gynecology;;   removal of basal cell carcinoma (nose)     TONSILECTOMY/ADENOIDECTOMY WITH MYRINGOTOMY     TONSILLECTOMY      Review of systems negative except as noted in HPI / PMHx or noted below:  Review of Systems  Constitutional: Negative.   HENT: Negative.    Eyes: Negative.   Respiratory: Negative.    Cardiovascular: Negative.   Gastrointestinal: Negative.   Genitourinary: Negative.   Musculoskeletal: Negative.   Skin: Negative.   Neurological: Negative.   Endo/Heme/Allergies: Negative.   Psychiatric/Behavioral: Negative.      Objective:   Vitals:   03/30/21 1227  BP: 112/70  Pulse: 75  Resp: 18  SpO2: 99%   Height: 5\' 5"  (165.1 cm)  Weight: 146 lb (66.2 kg)   Physical Exam Constitutional:      Appearance: She is not diaphoretic.  HENT:     Head: Normocephalic.     Right Ear:  Tympanic membrane, ear canal and external ear normal.     Left Ear: Tympanic membrane, ear canal and external ear normal.     Nose: Nose normal. No mucosal edema or rhinorrhea.     Mouth/Throat:     Pharynx: Uvula midline. No oropharyngeal exudate.  Eyes:     Conjunctiva/sclera: Conjunctivae normal.  Neck:     Thyroid: No thyromegaly.     Trachea: Trachea normal. No tracheal tenderness or tracheal deviation.  Cardiovascular:     Rate and Rhythm: Normal rate and regular rhythm.     Heart sounds: Normal heart sounds, S1 normal and S2 normal. No murmur heard. Pulmonary:     Effort: No respiratory distress.     Breath sounds: Normal breath sounds. No stridor. No wheezing or rales.  Lymphadenopathy:     Head:     Right side of head: No tonsillar adenopathy.     Left side of head: No tonsillar adenopathy.     Cervical: No cervical adenopathy.  Skin:    Findings: No erythema or rash.     Nails: There is no clubbing.  Neurological:     Mental Status: She is alert.    Diagnostics: none  Assessment and Plan:   1. Asthma, mild intermittent, well-controlled   2. Other allergic rhinitis   3. Hymenoptera allergy   4. Gastroesophageal reflux disease, unspecified whether esophagitis present   5. Postcholecystectomy syndrome     1. Continue the following if needed:   A. Zyrtec 10 mg one tablet once a day  B. Mucinex  C. Nasal saline  D. Ventolin HFA 2 puffs every 4-6 hours  E. EpiPen  2. Can try colestipol 1 g - 1 tablet 1 time per day. Does this help bloating?  3. Return to clinic in 1 year or earlier if problem  Meilyn can try colestipol to see if she is having a bile acid overexposure within her GI tract giving rise to her postcholecystectomy bloating.  She should know in a few days if this helps.  All of her other airway issues are under pretty good control and she can utilize the medications as noted above on an as-needed basis should they be required.  Assuming she does well  with  this plan I will see her back in this clinic in 1 year or earlier if there is a problem.  Allena Katz, MD Allergy / Immunology [facility]

## 2021-03-31 ENCOUNTER — Encounter: Payer: Self-pay | Admitting: Allergy and Immunology

## 2021-03-31 ENCOUNTER — Ambulatory Visit (INDEPENDENT_AMBULATORY_CARE_PROVIDER_SITE_OTHER): Payer: Medicare Other | Admitting: Pharmacist Clinician (PhC)/ Clinical Pharmacy Specialist

## 2021-03-31 ENCOUNTER — Encounter: Payer: Self-pay | Admitting: Pharmacist Clinician (PhC)/ Clinical Pharmacy Specialist

## 2021-03-31 DIAGNOSIS — E7800 Pure hypercholesterolemia, unspecified: Secondary | ICD-10-CM | POA: Diagnosis not present

## 2021-03-31 DIAGNOSIS — Z20822 Contact with and (suspected) exposure to covid-19: Secondary | ICD-10-CM | POA: Diagnosis not present

## 2021-03-31 NOTE — Patient Instructions (Signed)
Your Results: ?           ? Your most recent labs Goal  ?Total Cholesterol 189 < 200  ?Triglycerides 64 < 150  ?HDL (happy/good cholesterol) 80 > 40  ?LDL (lousy/bad cholesterol 97 < 70    ? ?Medication changes: ? We will start the process to get Praluent covered by your insurance.  Once approved use 1 injection every 14 days and repeat labs after 4-6 doses (about 2-3 months) ? ? ?Thank you for choosing [facility]  ? ?

## 2021-03-31 NOTE — Progress Notes (Signed)
04/01/2021 ?Nahomy Shoaf ?1950/02/29 ?[id number] ? ? ?HPI:  Valerie Snyder is a 71 y.o. female patient of Dr. Oval Linsey, who presents today for a lipid clinic evaluation.  In addition to hyperlipidemia, her cardiac history is significant for CAD.  A coronary calcium score done in 2020 showed her to be in the 72nd percentile for matched age/sex controls.   ? ?Current Medications: ezetimibe 10 mg qd ? ?Cholesterol Goals: LDL < 70 ?  ?Intolerant/previously tried: rosuvastatin, atorvastatin, simvastatin - all caused myalgias ? ?Family history: mother with cholesterol issues, had MI, CABG, died at 43; father no heart history, died at 62; 7 siblings, most have high cholesterol, no MI/stent among them; daugher with high cholesterol ,thyroid, son fineddfls ? ?Diet: medeteranean diet, no red meat in > 1 year ? ?Exercise:  40 min/day (currently recovering form gall bladder surgery) ? ?Labs: 01/20/21:  TC 189, TG 64, HDL 80, LDL 97  (on ezetimibe 10 mg) ? LDL 110 ? ? ?Current Outpatient Medications  ?Medication Sig Dispense Refill  ? albuterol (VENTOLIN HFA) 108 (90 Base) MCG/ACT inhaler Inhale 2 puffs into the lungs every 4-6 hours as needed for cough, wheeze, shortness of breath or cough. (Patient taking differently: 1 puff every 6 (six) hours as needed for shortness of breath.) 18 g 1  ? celecoxib (CELEBREX) 200 MG capsule Take 200 mg by mouth daily.    ? cetirizine (ZYRTEC) 10 MG tablet Take 10 mg by mouth daily as needed for allergies.     ? cholecalciferol (VITAMIN D) 25 MCG (1000 UNIT) tablet Take 1,000 Units by mouth daily.    ? Cranberry 450 MG CAPS Take 900 mg by mouth daily.     ? EPINEPHrine (EPIPEN 2-PAK) 0.3 mg/0.3 mL IJ SOAJ injection Inject 0.3 mLs (0.3 mg total) into the muscle once as needed. Reported on 01/20/2015 1 Device 1  ? ezetimibe (ZETIA) 10 MG tablet Take 1 tablet (10 mg total) by mouth daily. Please keep your upcoming appointment with Dr. Oval Linsey for refills. 90 tablet 0  ? hydroxychloroquine  (PLAQUENIL) 200 MG tablet Take 200 mg by mouth in the morning and at bedtime.    ? levothyroxine (SYNTHROID) 75 MCG tablet Take 75 mcg by mouth daily before breakfast.    ? lipase/protease/amylase (CREON) 36000 UNITS CPEP capsule Take 36,000 Units by mouth 3 (three) times daily before meals.    ? Magnesium 250 MG TABS Take 1 tablet (250 mg total) by mouth every other day.  0  ? Misc Natural Products (OSTEO BI-FLEX ADV TRIPLE ST PO) Take 2 tablets by mouth daily.    ? Omega-3 Fatty Acids (OMEGA-3 FISH OIL) 1200 MG CAPS Take 0.8333 capsules (1,000 mg total) by mouth daily.    ? pantoprazole (PROTONIX) 40 MG tablet Take 1 tablet (40 mg total) by mouth daily for 14 days. 14 tablet 0  ? Probiotic Product (SOLUBLE FIBER/PROBIOTICS PO) Take 1 capsule by mouth daily.    ? VENTOLIN HFA 108 (90 Base) MCG/ACT inhaler Inhale 2 puffs into the lungs every 4 (four) hours as needed for wheezing or shortness of breath. 18 g 1  ? vitamin C (ASCORBIC ACID) 250 MG tablet Take 2 tablets (500 mg total) by mouth daily.    ? zinc gluconate 50 MG tablet Take 1 tablet (50 mg total) by mouth daily.    ? colestipol (COLESTID) 1 g tablet Take 1 tablet (1 g total) by mouth daily. (Patient not taking: Reported on 03/31/2021) 30 tablet 5  ? EPINEPHrine (  EPIPEN 2-PAK) 0.3 mg/0.3 mL IJ SOAJ injection Inject 0.3 mg into the muscle as needed for anaphylaxis. (Patient not taking: Reported on 03/31/2021) 1 each 1  ? ?No current facility-administered medications for this visit.  ? ? ?Allergies  ?Allergen Reactions  ? Bee Venom Anaphylaxis  ? Cephalexin Anaphylaxis  ?  Puffy eyes and lips ?Other reaction(s): facial swelling  ? Ketorolac Tromethamine Anaphylaxis  ?  Puffy lips and eyes ?Other reaction(s): anaphylaxis  ? Atorvastatin   ?  Other reaction(s): muscle cramps  ? Rosuvastatin   ?  Other reaction(s): muscle cramps ?Other reaction(s): Other (See Comments) ?Other reaction(s): muscle cramps  ? Simvastatin   ?  Other reaction(s): muscle cramps ?Other  reaction(s): muscle cramps, Other (See Comments) ?Other reaction(s): muscle cramps ?  ? ? ?Past Medical History:  ?Diagnosis Date  ? Abnormal uterine bleeding (AUB)   ? Allergy   ? Asthma   ? allergy related  ? Breast cancer of 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 breast ([place]) 11/13/2014  ? CAD in native artery 01/15/2019  ? Asymptomatic coronary calcification on coronary calcium score.  72nd percentile 11/2018.  ? Complication of anesthesia   ? 10 years ago- pt states she stopped breathing and "had to be bagged"  ? Concussion 2018  ? GERD (gastroesophageal reflux disease)   ? H/O bilateral inguinal hernia repair 1992  ? Hypercholesteremia   ? Hypothyroidism   ? Migraines   ? Personal history of radiation therapy   ? Plantar fasciitis, bilateral   ? Pure hypercholesterolemia 10/10/2018  ? Radiation 01/12/15-02/04/15  ? right breast 42.17 gray  ? Spinal stenosis   ? Thyroid disease   ? ? ?Blood pressure 116/82, pulse 77, resp. rate 15, height 5\' 5"  (1.651 m), weight 147 lb 3.2 oz (66.8 kg), SpO2 98 %. ? ? ?Pure hypercholesterolemia ?Patient with CAD and LDL cholesterol not currently at goal.  Has been unable to tolerate multiple statin drugs due to myalgias.  Reviewed options for lowering LDL cholesterol, including PCSK-9 inhibitors, bempedoic acid and inclisiran.  Discussed mechanisms of action, dosing, side effects and potential decreases in LDL cholesterol.  Also reviewed cost information and potential options for patient assistance.  Answered all patient questions.  Based on this information, patient would prefer to start PCSK-9 inhibitor.  She has already applied for the [employer] and states she was told by insurance that she is pre-approved for [employer].  [facility] generally prefers Praluent, but will start PA for Repatha to see if it goes through.  Regardless, she will do 1 injection every 14 days and repeat labs after 4-6 doses.   ? ? ?Tommy Medal PharmD CPP [facility] ?[address] ?[place], [place] [address] ?[phone] ? ? ? ?

## 2021-04-01 ENCOUNTER — Encounter: Payer: Self-pay | Admitting: Pharmacist Clinician (PhC)/ Clinical Pharmacy Specialist

## 2021-04-01 ENCOUNTER — Other Ambulatory Visit (HOSPITAL_BASED_OUTPATIENT_CLINIC_OR_DEPARTMENT_OTHER): Payer: Self-pay | Admitting: Cardiovascular Disease

## 2021-04-01 DIAGNOSIS — Z9049 Acquired absence of other specified parts of digestive tract: Secondary | ICD-10-CM | POA: Diagnosis not present

## 2021-04-01 DIAGNOSIS — M19041 Primary osteoarthritis, right hand: Secondary | ICD-10-CM | POA: Diagnosis not present

## 2021-04-01 DIAGNOSIS — Z79899 Other long term (current) drug therapy: Secondary | ICD-10-CM | POA: Diagnosis not present

## 2021-04-01 DIAGNOSIS — E559 Vitamin D deficiency, unspecified: Secondary | ICD-10-CM | POA: Diagnosis not present

## 2021-04-01 DIAGNOSIS — K915 Postcholecystectomy syndrome: Secondary | ICD-10-CM | POA: Diagnosis not present

## 2021-04-01 DIAGNOSIS — E039 Hypothyroidism, unspecified: Secondary | ICD-10-CM | POA: Diagnosis not present

## 2021-04-01 DIAGNOSIS — M19042 Primary osteoarthritis, left hand: Secondary | ICD-10-CM | POA: Diagnosis not present

## 2021-04-01 DIAGNOSIS — E7800 Pure hypercholesterolemia, unspecified: Secondary | ICD-10-CM

## 2021-04-01 NOTE — Telephone Encounter (Signed)
Rx(s) sent to pharmacy electronically.  

## 2021-04-01 NOTE — Assessment & Plan Note (Signed)
Patient with CAD and LDL cholesterol not currently at goal.  Has been unable to tolerate multiple statin drugs due to myalgias.  Reviewed options for lowering LDL cholesterol, including PCSK-9 inhibitors, bempedoic acid and inclisiran.  Discussed mechanisms of action, dosing, side effects and potential decreases in LDL cholesterol.  Also reviewed cost information and potential options for patient assistance.  Answered all patient questions.  Based on this information, patient would prefer to start PCSK-9 inhibitor.  She has already applied for the [employer] and states she was told by insurance that she is pre-approved for [employer].  [facility] generally prefers Praluent, but will start PA for Repatha to see if it goes through.  Regardless, she will do 1 injection every 14 days and repeat labs after 4-6 doses.   ? ?

## 2021-04-05 DIAGNOSIS — L817 Pigmented purpuric dermatosis: Secondary | ICD-10-CM | POA: Diagnosis not present

## 2021-04-05 DIAGNOSIS — L821 Other seborrheic keratosis: Secondary | ICD-10-CM | POA: Diagnosis not present

## 2021-04-05 DIAGNOSIS — L814 Other melanin hyperpigmentation: Secondary | ICD-10-CM | POA: Diagnosis not present

## 2021-04-05 DIAGNOSIS — X32XXXS Exposure to sunlight, sequela: Secondary | ICD-10-CM | POA: Diagnosis not present

## 2021-04-05 DIAGNOSIS — D1801 Hemangioma of skin and subcutaneous tissue: Secondary | ICD-10-CM | POA: Diagnosis not present

## 2021-04-08 DIAGNOSIS — Z6823 Body mass index (BMI) 23.0-23.9, adult: Secondary | ICD-10-CM | POA: Diagnosis not present

## 2021-04-08 DIAGNOSIS — M19042 Primary osteoarthritis, left hand: Secondary | ICD-10-CM | POA: Diagnosis not present

## 2021-04-08 DIAGNOSIS — M154 Erosive (osteo)arthritis: Secondary | ICD-10-CM | POA: Diagnosis not present

## 2021-04-08 DIAGNOSIS — M19041 Primary osteoarthritis, right hand: Secondary | ICD-10-CM | POA: Diagnosis not present

## 2021-04-13 ENCOUNTER — Telehealth: Payer: Self-pay

## 2021-04-13 ENCOUNTER — Other Ambulatory Visit: Payer: Self-pay

## 2021-04-13 DIAGNOSIS — E7800 Pure hypercholesterolemia, unspecified: Secondary | ICD-10-CM

## 2021-04-13 MED ORDER — PRALUENT 150 MG/ML ~~LOC~~ SOAJ: 150.0000 mg | SUBCUTANEOUS | 11 refills | Status: DC

## 2021-04-13 NOTE — Telephone Encounter (Signed)
Pa submitted for repatha '140mg'$  q2w however it stated drug not covered by plan so I continued to fill it out and send it in so that the pt will receive the denial letter as well.  ? ?DANELLA PHILSON (Key: [place]) - [id number] ?Repatha SureClick '140MG'$ /ML auto-injectors ?Status: PA Request ?Created: March 14th, 2023 ?Sent: March 14th, 2023 ? ?Pa also submitted for praluent '150mg'$  q2w as a back up plan for when repatha gets denied. ? ?CARINNA NEWHART (Key: [id number]) - [id number] ?Praluent '150MG'$ /ML auto-injectors ?Status: PA Request ?Created: March 14th, 2023 ?Sent: March 14th, 2023 ? ?Lipid/hepatic panel ordered and released ? ?Pt already has the [employer] grant approved for the $2500 yearly to aid w/pcsk9 cost.  ? ?Pt will be notified when an approval is made on either pcsk9 inhibitor.  ? ?All orders were verbally given by kristin alvstad rph ? ?Will route to melinda pratt lpn and kristin alvstad to make them aware that it has been started and awaiting determination.  ?

## 2021-04-13 NOTE — Telephone Encounter (Signed)
Praluent '150mg'$  approved rx sent. Will mychart msg the pt to let them know next steps ?

## 2021-04-13 NOTE — Telephone Encounter (Signed)
-----   Message from Earvin Hansen, LPN sent at 03/02/4386  6:26 PM EDT ----- ?Hello ladies ? ?This patient was in today with her husband. She was asking about her Repatha prior auth... or maybe Praulent. Told her I would reach out  ? ?Thanks ? ?Rip Harbour  ? ?

## 2021-04-21 ENCOUNTER — Other Ambulatory Visit: Payer: Self-pay

## 2021-04-21 DIAGNOSIS — I872 Venous insufficiency (chronic) (peripheral): Secondary | ICD-10-CM

## 2021-04-22 ENCOUNTER — Other Ambulatory Visit: Payer: Self-pay

## 2021-04-22 ENCOUNTER — Ambulatory Visit (HOSPITAL_COMMUNITY)
Admission: RE | Admit: 2021-04-22 | Discharge: 2021-04-22 | Disposition: A | Discharge status: Home / Self Care | Payer: Medicare Other | Source: Ambulatory Visit | Attending: Vascular Surgery | Admitting: Vascular Surgery

## 2021-04-22 DIAGNOSIS — I872 Venous insufficiency (chronic) (peripheral): Secondary | ICD-10-CM | POA: Diagnosis not present

## 2021-04-23 ENCOUNTER — Telehealth: Payer: Self-pay | Admitting: Cardiovascular Disease

## 2021-04-23 ENCOUNTER — Telehealth: Payer: Self-pay | Admitting: Allergy and Immunology

## 2021-04-23 DIAGNOSIS — B338 Other specified viral diseases: Secondary | ICD-10-CM | POA: Diagnosis not present

## 2021-04-23 NOTE — Telephone Encounter (Signed)
She can stop taking Colestipol if it is not decreasing her bloating. Thank you

## 2021-04-23 NOTE — Telephone Encounter (Signed)
Called and informed patient. Patient verbalized understanding.  

## 2021-04-23 NOTE — Telephone Encounter (Signed)
Pt c/o medication issue: ? ?1. Name of Medication: Alirocumab ([place]) 150 MG/ML SOAJ ? ?2. How are you currently taking this medication (dosage and times per day)? Has not started ? ?3. Are you having a reaction (difficulty breathing--STAT)? no ? ?4. What is your medication issue? Patient calling to request an appointment to guide her through the first injection of the praluent. ?

## 2021-04-23 NOTE — Telephone Encounter (Signed)
Do you mind advising since Dr. Neldon Mc is out of office?  ?

## 2021-04-23 NOTE — Telephone Encounter (Signed)
Patient states she is currently taking Colestipol for bloating but it has not helped her symptoms. She also read the side effects of this medication and one of them was "bloating". Patient is confused as to why she is taking this medication for bloating but it's a side effect. She would like to know if it would be okay with Dr. Neldon Mc for her to stop taking it tonight.  ?

## 2021-04-23 NOTE — Telephone Encounter (Signed)
Message sent to pharmacist. ?

## 2021-04-26 ENCOUNTER — Encounter: Payer: Self-pay | Admitting: Pharmacist Clinician (PhC)/ Clinical Pharmacy Specialist

## 2021-04-28 ENCOUNTER — Encounter: Payer: Self-pay | Admitting: Vascular Surgery

## 2021-04-28 ENCOUNTER — Ambulatory Visit (INDEPENDENT_AMBULATORY_CARE_PROVIDER_SITE_OTHER): Payer: Medicare Other | Admitting: Vascular Surgery

## 2021-04-28 VITALS — BP 111/72 | HR 80 | Temp 98.1°F | Resp 20 | Ht 65.0 in | Wt 145.0 lb

## 2021-04-28 DIAGNOSIS — Z20822 Contact with and (suspected) exposure to covid-19: Secondary | ICD-10-CM | POA: Diagnosis not present

## 2021-04-28 DIAGNOSIS — M25562 Pain in left knee: Secondary | ICD-10-CM

## 2021-04-28 DIAGNOSIS — M25561 Pain in right knee: Secondary | ICD-10-CM

## 2021-04-28 NOTE — Progress Notes (Signed)
? ?Patient ID: Valerie Snyder, female   DOB: 02-18-1950, 71 y.o.   MRN: [id number] ? ?Reason for Consult: Follow-up ?  ?Referred by Vernie Shanks, MD ? ?Subjective:  ?   ?HPI: ? ?Valerie Snyder is a 71 y.o. female with history of sclerotherapy has been evaluated here for skin discoloration as well as swelling.  She has had treatment with sclerotherapy at 2 separate times in the past.  She does not have any history of DVT.  She does have a history of varicose veins in both her mother and her sisters.  She denies any history of tissue loss or ulceration.  She does have pain in her left ankle sometimes in her right leg.  She has been diagnosed with neuropathy by podiatry and has been started on medication for this.  She does not take any blood thinners. ? ?Past Medical History:  ?Diagnosis Date  ? Abnormal uterine bleeding (AUB)   ? Allergy   ? Asthma   ? allergy related  ? Breast cancer of 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 breast ([place]) 11/13/2014  ? CAD in native artery 01/15/2019  ? Asymptomatic coronary calcification on coronary calcium score.  72nd percentile 11/2018.  ? Complication of anesthesia   ? 10 years ago- pt states she stopped breathing and "had to be bagged"  ? Concussion 2018  ? GERD (gastroesophageal reflux disease)   ? H/O bilateral inguinal hernia repair 1992  ? Hypercholesteremia   ? Hypothyroidism   ? Migraines   ? Personal history of radiation therapy   ? Plantar fasciitis, bilateral   ? Pure hypercholesterolemia 10/10/2018  ? Radiation 01/12/15-02/04/15  ? right breast 42.17 gray  ? Spinal stenosis   ? Thyroid disease   ? ?Family History  ?Problem Relation Age of Onset  ? Pancreatic cancer Maternal Aunt   ?     dx. 40s-early 80s  ? Congestive Heart Failure Mother 36  ? Lung disease Mother   ? Colon polyps Mother   ?     unspecified number  ? Dementia Father   ?     Lewy Body Dementia  ? Kidney failure Father   ? Stroke Father   ? Depression Father   ? Multiple sclerosis Sister   ?  Depression Sister   ? Aneurysm Maternal Grandmother   ?     brain  ? Heart attack Maternal Grandfather   ? Emphysema Paternal Grandfather   ? Heart Problems Paternal Grandfather   ? Other Sister   ?     3 sisters have history of cysts in uterus  ? Depression Sister   ? Breast cancer Cousin   ?     dx. 45-50  ? Testicular cancer Cousin   ?     dx. 106s  ? Colon polyps Daughter   ?     unspecified number  ? ?Past Surgical History:  ?Procedure Laterality Date  ? BLADDER SUSPENSION    ? BLEPHAROPLASTY Bilateral   ? BREAST LUMPECTOMY Right 12/10/2014  ? BREAST LUMPECTOMY WITH RADIOACTIVE SEED LOCALIZATION Right 12/10/2014  ? Procedure: RIGHT BREAST LUMPECTOMY WITH RADIOACTIVE SEED LOCALIZATION;  Surgeon: Autumn Messing III, MD;  Location: [place];  Service: General;  Laterality: Right;  ? CHOLECYSTECTOMY N/A 02/20/2021  ? Procedure: LAPAROSCOPIC CHOLECYSTECTOMY;  Surgeon: Ralene Ok, MD;  Location: [place];  Service: General;  Laterality: N/A;  ? DILATATION & CURETTAGE/HYSTEROSCOPY WITH MYOSURE N/A 07/15/2020  ? Procedure: [place];  Surgeon:  Christophe Louis, MD;  Location: [place];  Service: Gynecology;  Laterality: N/A;  ? FOOT SURGERY    ? plantar fashiectomies from both feet  ? HERNIA REPAIR    ? HYSTEROSCOPY WITH D & C N/A 12/27/2017  ? Procedure: DILATATION AND CURETTAGE /HYSTEROSCOPY;  Surgeon: Janyth Pupa, DO;  Location: [place];  Service: Gynecology;  Laterality: N/A;  ? NASAL SEPTUM SURGERY    ? neuromas removed from right foot    ? osteoma  1991  ? removal from right orbital  ? POLYPECTOMY  12/27/2017  ? Procedure: POLYPECTOMY;  Surgeon: Janyth Pupa, DO;  Location: [place];  Service: Gynecology;;  ? removal of basal cell carcinoma (nose)    ? TONSILECTOMY/ADENOIDECTOMY WITH MYRINGOTOMY    ? TONSILLECTOMY    ? ? ?Short Social History:  ?Social History  ? ?Tobacco Use  ? Smoking status:  Never  ? Smokeless tobacco: Never  ?Substance Use Topics  ? Alcohol use: No  ? ? ?Allergies  ?Allergen Reactions  ? Bee Venom Anaphylaxis  ? Cephalexin Anaphylaxis  ?  Puffy eyes and lips ?Other reaction(s): facial swelling  ? Ketorolac Tromethamine Anaphylaxis  ?  Puffy lips and eyes ?Other reaction(s): anaphylaxis  ? Atorvastatin   ?  Other reaction(s): muscle cramps  ? Rosuvastatin   ?  Other reaction(s): muscle cramps ?Other reaction(s): Other (See Comments) ?Other reaction(s): muscle cramps  ? Simvastatin   ?  Other reaction(s): muscle cramps ?Other reaction(s): muscle cramps, Other (See Comments) ?Other reaction(s): muscle cramps ?  ? ? ?Current Outpatient Medications  ?Medication Sig Dispense Refill  ? Alirocumab (PRALUENT) 150 MG/ML SOAJ Inject 150 mg into the skin every 14 (fourteen) days. 2 mL 11  ? celecoxib (CELEBREX) 200 MG capsule Take 200 mg by mouth daily.    ? cetirizine (ZYRTEC) 10 MG tablet Take 10 mg by mouth daily as needed for allergies.     ? cholecalciferol (VITAMIN D) 25 MCG (1000 UNIT) tablet Take 1,000 Units by mouth daily.    ? Cranberry 450 MG CAPS Take 900 mg by mouth daily.     ? EPINEPHrine (EPIPEN 2-PAK) 0.3 mg/0.3 mL IJ SOAJ injection Inject 0.3 mg into the muscle as needed for anaphylaxis. 1 each 1  ? ezetimibe (ZETIA) 10 MG tablet Take 1 tablet (10 mg total) by mouth daily. 90 tablet 2  ? hydroxychloroquine (PLAQUENIL) 200 MG tablet Take 200 mg by mouth in the morning and at bedtime.    ? levothyroxine (SYNTHROID) 75 MCG tablet Take 75 mcg by mouth daily before breakfast.    ? Magnesium 250 MG TABS Take 1 tablet (250 mg total) by mouth every other day.  0  ? Misc Natural Products (OSTEO BI-FLEX ADV TRIPLE ST PO) Take 2 tablets by mouth daily.    ? Omega-3 Fatty Acids (OMEGA-3 FISH OIL) 1200 MG CAPS Take 0.8333 capsules (1,000 mg total) by mouth daily.    ? pantoprazole (PROTONIX) 40 MG tablet Take 1 tablet (40 mg total) by mouth daily for 14 days. 14 tablet 0  ? Probiotic Product  (SOLUBLE FIBER/PROBIOTICS PO) Take 1 capsule by mouth daily.    ? VENTOLIN HFA 108 (90 Base) MCG/ACT inhaler Inhale 2 puffs into the lungs every 4 (four) hours as needed for wheezing or shortness of breath. 18 g 1  ? vitamin C (ASCORBIC ACID) 250 MG tablet Take 2 tablets (500 mg total) by mouth daily.    ? zinc gluconate 50  MG tablet Take 1 tablet (50 mg total) by mouth daily.    ? ?No current facility-administered medications for this visit.  ? ? ?Review of Systems  ?Constitutional:  Constitutional negative. ?HENT: HENT negative.  ?Eyes: Eyes negative.  ?Respiratory: Respiratory negative.  ?Cardiovascular: Cardiovascular negative.  ?GI: Gastrointestinal negative.  ?Musculoskeletal: Positive for leg pain.  ?Neurological: Neurological negative. ?Hematologic: Hematologic/lymphatic negative.  ?Psychiatric: Psychiatric negative.   ? ?   ?Objective:  ?Objective  ?Vitals:  ? 04/28/21 1216  ?BP: 111/72  ?Pulse: 80  ?Resp: 20  ?Temp: 98.1 ?F (36.7 ?C)  ?SpO2: 98%  ? ? ? ?Physical Exam ?HENT:  ?   Head: Normocephalic.  ?   Nose: Nose normal.  ?   Mouth/Throat:  ?   Mouth: Mucous membranes are moist.  ?Eyes:  ?   Pupils: Pupils are equal, round, and reactive to light.  ?Cardiovascular:  ?   Rate and Rhythm: Normal rate.  ?   Pulses: Normal pulses.  ?Abdominal:  ?   General: Abdomen is flat.  ?   Palpations: Abdomen is soft.  ?Musculoskeletal:  ?   Cervical back: Normal range of motion.  ?   Right lower leg: No edema.  ?   Left lower leg: No edema.  ?Skin: ?   General: Skin is warm.  ?   Capillary Refill: Capillary refill takes less than 2 seconds.  ?   Comments: Multiple areas of reticular veins and telangiectasias right greater than left leg  ?Neurological:  ?   General: No focal deficit present.  ?   Mental Status: She is alert.  ?Psychiatric:     ?   Mood and Affect: Mood normal.     ?   Thought Content: Thought content normal.  ? ? ?Data: ?LEFT          Reflux NoRefluxReflux TimeDiameter cmsComments  ?                         Yes                                   ?+--------------+---------+------+-----------+------------+--------+  ?CFV           no                                              ?+--------------+---------+------+

## 2021-04-30 DIAGNOSIS — R1084 Generalized abdominal pain: Secondary | ICD-10-CM | POA: Diagnosis not present

## 2021-04-30 DIAGNOSIS — K5900 Constipation, unspecified: Secondary | ICD-10-CM | POA: Diagnosis not present

## 2021-04-30 DIAGNOSIS — R140 Abdominal distension (gaseous): Secondary | ICD-10-CM | POA: Diagnosis not present

## 2021-05-04 ENCOUNTER — Ambulatory Visit (INDEPENDENT_AMBULATORY_CARE_PROVIDER_SITE_OTHER): Payer: Medicare Other

## 2021-05-04 VITALS — BP 136/80 | HR 85 | Ht 65.0 in | Wt 144.4 lb

## 2021-05-04 DIAGNOSIS — I2510 Atherosclerotic heart disease of native coronary artery without angina pectoris: Secondary | ICD-10-CM | POA: Diagnosis not present

## 2021-05-04 DIAGNOSIS — E7800 Pure hypercholesterolemia, unspecified: Secondary | ICD-10-CM

## 2021-05-04 NOTE — Progress Notes (Signed)
Praluent '150mg'$ /once every 14 days ?LOT: [id number] ?EXP: 07/31/2022 ? ? ?Nurse Visit  ? ?Date of Encounter: 05/04/2021 ?IDBarabara Snyder, DOB 21-Jul-1950, MRN [id number] ? ?PCP:  Vernie Shanks, MD ?  ?[facility] Providers ?Cardiologist:  Skeet Latch, MD   ? ? ?Visit Details  ? ?VS:  BP 136/80 (BP Location: Left Arm)   Pulse 85   Ht '5\' 5"'$  (1.651 m)   Wt 144 lb 6.4 oz (65.5 kg)   BMI 24.03 kg/m?  , BMI Body mass index is 24.03 kg/m?. ? ?Wt Readings from Last 3 Encounters:  ?05/04/21 144 lb 6.4 oz (65.5 kg)  ?04/28/21 145 lb (65.8 kg)  ?03/31/21 147 lb 3.2 oz (66.8 kg)  ?  ? ?Reason for visit: Injection ?Performed today: Vitals and Education ?Changes (medications, testing, etc.) : None ?Length of Visit: 30 minutes ? ? ? ?Medications Adjustments/Labs and Tests Ordered: ?No orders of the defined types were placed in this encounter. ? ?No orders of the defined types were placed in this encounter. ? ? ? ?Signed, ?Orvan July, RN  ?05/04/2021 3:12 PM ? ?

## 2021-05-05 DIAGNOSIS — Z20822 Contact with and (suspected) exposure to covid-19: Secondary | ICD-10-CM | POA: Diagnosis not present

## 2021-05-06 DIAGNOSIS — H9209 Otalgia, unspecified ear: Secondary | ICD-10-CM | POA: Diagnosis not present

## 2021-05-06 DIAGNOSIS — B029 Zoster without complications: Secondary | ICD-10-CM | POA: Diagnosis not present

## 2021-05-06 DIAGNOSIS — Z20828 Contact with and (suspected) exposure to other viral communicable diseases: Secondary | ICD-10-CM | POA: Diagnosis not present

## 2021-05-06 DIAGNOSIS — R519 Headache, unspecified: Secondary | ICD-10-CM | POA: Diagnosis not present

## 2021-05-11 ENCOUNTER — Encounter (HOSPITAL_BASED_OUTPATIENT_CLINIC_OR_DEPARTMENT_OTHER): Payer: Self-pay | Admitting: Cardiovascular Disease

## 2021-05-11 DIAGNOSIS — M19042 Primary osteoarthritis, left hand: Secondary | ICD-10-CM | POA: Diagnosis not present

## 2021-05-11 DIAGNOSIS — Z20822 Contact with and (suspected) exposure to covid-19: Secondary | ICD-10-CM | POA: Diagnosis not present

## 2021-05-12 ENCOUNTER — Encounter: Payer: Self-pay | Admitting: Pharmacist Clinician (PhC)/ Clinical Pharmacy Specialist

## 2021-05-12 NOTE — Telephone Encounter (Signed)
Okay to proceed with next injection? I do not see any future labs ordered by her last office visit for after 4th dose, do I need to order them?  ?

## 2021-05-12 NOTE — Telephone Encounter (Signed)
Follow up question! Thanks!  ?

## 2021-05-12 NOTE — Telephone Encounter (Signed)
Response from earlier  ?

## 2021-05-14 DIAGNOSIS — R059 Cough, unspecified: Secondary | ICD-10-CM | POA: Diagnosis not present

## 2021-05-14 DIAGNOSIS — Z20822 Contact with and (suspected) exposure to covid-19: Secondary | ICD-10-CM | POA: Diagnosis not present

## 2021-05-14 DIAGNOSIS — R051 Acute cough: Secondary | ICD-10-CM | POA: Diagnosis not present

## 2021-05-18 DIAGNOSIS — Z20822 Contact with and (suspected) exposure to covid-19: Secondary | ICD-10-CM | POA: Diagnosis not present

## 2021-05-19 DIAGNOSIS — Z20822 Contact with and (suspected) exposure to covid-19: Secondary | ICD-10-CM | POA: Diagnosis not present

## 2021-05-24 ENCOUNTER — Other Ambulatory Visit: Payer: Self-pay | Admitting: Gastroenterology

## 2021-05-24 ENCOUNTER — Ambulatory Visit
Admission: RE | Admit: 2021-05-24 | Discharge: 2021-05-24 | Disposition: A | Discharge status: Home / Self Care | Payer: Medicare Other | Source: Ambulatory Visit | Attending: Gastroenterology | Admitting: Gastroenterology

## 2021-05-24 DIAGNOSIS — K5900 Constipation, unspecified: Secondary | ICD-10-CM | POA: Diagnosis not present

## 2021-05-24 DIAGNOSIS — R140 Abdominal distension (gaseous): Secondary | ICD-10-CM | POA: Diagnosis not present

## 2021-05-25 ENCOUNTER — Encounter: Payer: Self-pay | Admitting: Pharmacist Clinician (PhC)/ Clinical Pharmacy Specialist

## 2021-05-27 DIAGNOSIS — Z20822 Contact with and (suspected) exposure to covid-19: Secondary | ICD-10-CM | POA: Diagnosis not present

## 2021-05-28 ENCOUNTER — Encounter (HOSPITAL_BASED_OUTPATIENT_CLINIC_OR_DEPARTMENT_OTHER): Payer: Self-pay | Admitting: Cardiovascular Disease

## 2021-05-28 NOTE — Telephone Encounter (Signed)
Please advise 

## 2021-05-28 NOTE — Telephone Encounter (Signed)
Sorry to hear about her surgery.  OK to hold the next Praluent injection until she is feeling better. ? ?TCR

## 2021-06-07 DIAGNOSIS — Z20822 Contact with and (suspected) exposure to covid-19: Secondary | ICD-10-CM | POA: Diagnosis not present

## 2021-06-14 DIAGNOSIS — J310 Chronic rhinitis: Secondary | ICD-10-CM | POA: Diagnosis not present

## 2021-06-14 DIAGNOSIS — H6123 Impacted cerumen, bilateral: Secondary | ICD-10-CM | POA: Diagnosis not present

## 2021-06-14 DIAGNOSIS — J342 Deviated nasal septum: Secondary | ICD-10-CM | POA: Diagnosis not present

## 2021-06-15 NOTE — Telephone Encounter (Signed)
Hey do you all have any injection help slots on 5/26? This patient has an appointment that day with [facility]?  ?

## 2021-06-24 ENCOUNTER — Encounter: Payer: Self-pay | Admitting: Podiatry

## 2021-06-25 ENCOUNTER — Ambulatory Visit (INDEPENDENT_AMBULATORY_CARE_PROVIDER_SITE_OTHER): Payer: Medicare Other | Admitting: Pharmacist

## 2021-06-25 ENCOUNTER — Other Ambulatory Visit: Payer: Self-pay | Admitting: Podiatry

## 2021-06-25 VITALS — Wt 145.6 lb

## 2021-06-25 DIAGNOSIS — E7800 Pure hypercholesterolemia, unspecified: Secondary | ICD-10-CM | POA: Diagnosis not present

## 2021-06-25 DIAGNOSIS — I2510 Atherosclerotic heart disease of native coronary artery without angina pectoris: Secondary | ICD-10-CM | POA: Diagnosis not present

## 2021-06-25 DIAGNOSIS — M722 Plantar fascial fibromatosis: Secondary | ICD-10-CM

## 2021-06-25 NOTE — Progress Notes (Signed)
   Pharmacist Visit   Date of Encounter: 06/25/2021 ID: Valerie Snyder, DOB 02-05-1950, MRN [id number]  PCP:  Martinique, Peter M, MD   [facility] Providers Cardiologist:  Skeet Latch, MD {   Visit Details    Wt Readings from Last 3 Encounters:  05/04/21 144 lb 6.4 oz (65.5 kg)  04/28/21 145 lb (65.8 kg)  03/31/21 147 lb 3.2 oz (66.8 kg)     Educated patient on how to administer second Praluent injection.  Patient was able to inject on outer left thigh.  Reason for visit: Praluent injection Performed today: Education Changes (medications, testing, etc.) : no changes Length of Visit: 10 minutes    Medications Adjustments/Labs and Tests Ordered: No orders of the defined types were placed in this encounter.  No orders of the defined types were placed in this encounter.    Alvy Bimler, [facility]  06/25/2021 1:52 PM

## 2021-06-25 NOTE — Telephone Encounter (Signed)
Please advise 

## 2021-07-01 ENCOUNTER — Ambulatory Visit: Payer: Medicare Other | Admitting: Physical Therapy

## 2021-07-04 ENCOUNTER — Encounter: Payer: Self-pay | Admitting: Pharmacist

## 2021-07-07 ENCOUNTER — Other Ambulatory Visit: Payer: Self-pay | Admitting: Gastroenterology

## 2021-07-07 ENCOUNTER — Other Ambulatory Visit (HOSPITAL_COMMUNITY): Payer: Self-pay | Admitting: Gastroenterology

## 2021-07-07 DIAGNOSIS — H04123 Dry eye syndrome of bilateral lacrimal glands: Secondary | ICD-10-CM | POA: Diagnosis not present

## 2021-07-07 DIAGNOSIS — H0100A Unspecified blepharitis right eye, upper and lower eyelids: Secondary | ICD-10-CM | POA: Diagnosis not present

## 2021-07-07 DIAGNOSIS — R140 Abdominal distension (gaseous): Secondary | ICD-10-CM | POA: Diagnosis not present

## 2021-07-07 DIAGNOSIS — H0100B Unspecified blepharitis left eye, upper and lower eyelids: Secondary | ICD-10-CM | POA: Diagnosis not present

## 2021-07-07 DIAGNOSIS — K5909 Other constipation: Secondary | ICD-10-CM | POA: Diagnosis not present

## 2021-07-07 DIAGNOSIS — H43813 Vitreous degeneration, bilateral: Secondary | ICD-10-CM | POA: Diagnosis not present

## 2021-07-14 ENCOUNTER — Ambulatory Visit: Payer: Medicare Other | Attending: Podiatry

## 2021-07-14 DIAGNOSIS — M25571 Pain in right ankle and joints of right foot: Secondary | ICD-10-CM | POA: Insufficient documentation

## 2021-07-14 DIAGNOSIS — R279 Unspecified lack of coordination: Secondary | ICD-10-CM | POA: Insufficient documentation

## 2021-07-14 DIAGNOSIS — M722 Plantar fascial fibromatosis: Secondary | ICD-10-CM | POA: Diagnosis not present

## 2021-07-14 DIAGNOSIS — M25572 Pain in left ankle and joints of left foot: Secondary | ICD-10-CM | POA: Insufficient documentation

## 2021-07-14 NOTE — Therapy (Signed)
OUTPATIENT PHYSICAL THERAPY LOWER EXTREMITY EVALUATION   Patient Name: Valerie Snyder MRN: [id number] DOB:1950/02/04, 71 y.o., female Today's Date: 07/14/2021   PT End of Session - 07/14/21 1447     Visit Number 1    Date for PT Re-Evaluation 08/04/21    PT Start Time 1447    PT Stop Time 1535    PT Time Calculation (min) 48 min    Activity Tolerance Patient tolerated treatment well    Behavior During Therapy [facility] for tasks assessed/performed             Past Medical History:  Diagnosis Date   Abnormal uterine bleeding (AUB)    Allergy    Asthma    allergy related   Breast cancer of 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 breast ([place]) 11/13/2014   CAD in native artery 01/15/2019   Asymptomatic coronary calcification on coronary calcium score.  72nd percentile 11/2018.   Complication of anesthesia    10 years ago- pt states she stopped breathing and "had to be bagged"   Concussion 2018   GERD (gastroesophageal reflux disease)    H/O bilateral inguinal hernia repair 1992   Hypercholesteremia    Hypothyroidism    Migraines    Personal history of radiation therapy    Plantar fasciitis, bilateral    Pure hypercholesterolemia 10/10/2018   Radiation 01/12/15-02/04/15   right breast 42.17 gray   Spinal stenosis    Thyroid disease    Past Surgical History:  Procedure Laterality Date   BLADDER SUSPENSION     BLEPHAROPLASTY Bilateral    BREAST LUMPECTOMY Right 12/10/2014   BREAST LUMPECTOMY WITH RADIOACTIVE SEED LOCALIZATION Right 12/10/2014   Procedure: RIGHT BREAST LUMPECTOMY WITH RADIOACTIVE SEED LOCALIZATION;  Surgeon: Autumn Messing III, MD;  Location: [place];  Service: General;  Laterality: Right;   CHOLECYSTECTOMY N/A 02/20/2021   Procedure: LAPAROSCOPIC CHOLECYSTECTOMY;  Surgeon: Ralene Ok, MD;  Location: [place];  Service: General;  Laterality: N/A;   [place] N/A 07/15/2020   Procedure: DILATATION &  CURETTAGE/HYSTEROSCOPY Everlene Balls USING MYOSURE;  Surgeon: Christophe Louis, MD;  Location: [place];  Service: Gynecology;  Laterality: N/A;   FOOT SURGERY     plantar fashiectomies from both feet   HERNIA REPAIR     HYSTEROSCOPY WITH D & C N/A 12/27/2017   Procedure: DILATATION AND CURETTAGE /HYSTEROSCOPY;  Surgeon: Janyth Pupa, DO;  Location: [place];  Service: Gynecology;  Laterality: N/A;   NASAL SEPTUM SURGERY     neuromas removed from right foot     osteoma  1991   removal from right orbital   POLYPECTOMY  12/27/2017   Procedure: POLYPECTOMY;  Surgeon: Janyth Pupa, DO;  Location: [place];  Service: Gynecology;;   removal of basal cell carcinoma (nose)     TONSILECTOMY/ADENOIDECTOMY WITH MYRINGOTOMY     TONSILLECTOMY     Patient Active Problem List   Diagnosis Date Noted   Cholecystitis 02/19/2021   CAD in native artery 01/15/2019   Pure hypercholesterolemia 10/10/2018   Genetic testing 12/10/2014   Breast cancer of 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 breast ([place]) 11/13/2014   Allergic rhinoconjunctivitis 10/22/2014   Asthma 10/22/2014   History of basal cell cancer 03/21/2014   Visit for wound check 07/29/2013   Visit for suture removal 07/11/2013   Basal cell carcinoma of nasal tip 07/04/2013   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   Neoplasm of uncertain behavior of skin 06/19/2013  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 She had plantar fasciectomies on both feet. She is wearing inserts in her shoes and does 31mn of exercise for feet, lymphedema, and general stretching every day.   PERTINENT HISTORY: Breast cancer survivor  Concussion 2018 Galbladder sx January 2023 Sx on L hand for osteoarthritis  2 neuromas removed from R foot    PAIN:  Are you having pain? Yes: NPRS scale: 2/10 Pain location: medial L foot and lateral ankle, and in R foot  Pain description: "phantom pain that comes and goes", burning Aggravating factors: sitting in the car and getting out Relieving factors: ice, roller, frozen water bottle   PRECAUTIONS: Fall  WEIGHT BEARING RESTRICTIONS No  FALLS:  Has patient fallen in last 6 months? 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, hospital PLOF: Independent  PATIENT GOALS pain free, strengthening, preventative measures if possible    OBJECTIVE:   DIAGNOSTIC FINDINGS: unremarkable and no significant findings     PATIENT SURVEYS:  FOTO TBD  COGNITION:  Overall cognitive status: Within functional limits for tasks assessed and Impaired: Memory: Impaired: difficulty recalling things that happened years ago, may be lingering effects of concussion in 2018      SENSATION: WFL  POSTURE: rounded shoulders and forward head  LOWER EXTREMITY ROM: WFL for all other  Active ROM Right eval Left eval  Hip flexion    Hip extension    Hip abduction    Hip adduction    Hip internal rotation    Hip external rotation    Knee flexion    Knee extension    Ankle dorsiflexion 10d 17d  Ankle plantarflexion     Ankle inversion    Ankle eversion     (Blank rows = not tested)  LOWER EXTREMITY MMT:  MMT Right eval Left eval  Hip flexion 4 4-  Hip extension    Hip abduction    Hip adduction    Hip internal rotation    Hip external rotation    Knee flexion 4 4  Knee extension 4 4  Ankle dorsiflexion 4 4  Ankle plantarflexion 4- 4-  Ankle inversion 4 4  Ankle eversion 4 4   (Blank rows = not tested)  GASTROC STRENGTH  R 25  L 25  FUNCTIONAL TESTS:  5 times sit to stand: 11.47s Timed up and go (TUG): 10.08s SL 10s Bilaterally    TODAY'S TREATMENT: POC, review of some exercises   Gastroc stretch  Towel scrunches  Heel to toe raises   PATIENT EDUCATION:  Education details: POC Person educated: Patient Education method: ECustomer service managerEducation comprehension: verbalized understanding and returned demonstration  HOME EXERCISE PROGRAM: TBD  ASSESSMENT:  CLINICAL IMPRESSION: Patient is a 71 y.o. female who was seen today for physical therapy evaluation and treatment for bilateral feet and ankle pain. Patient reports she does not have plantar fascitis and has had lots of trouble over the years trying to figure out what may be contributing to her pain especially on her L side. She has tried many different treatments including injections, bracing, orthotics, therapy and exercises without any significant improvement. She does have a history of surgery in 2006 for plantar fasciectomy as well as ECSWT in 2008 on the left. She presents with some chronic regional pain symptoms on the L and has burning and shooting pain with certain clothes rubbing over her leg. 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 Patient is doing really well and has slight strength and ROM deficits and was referred to PT to possibly try dry needling and other modalities to see if that can help. Because patient is doing well otherwise, will pick up 1x/week to  test modalities and work on general strengthening of LE and foot/ankle.    OBJECTIVE IMPAIRMENTS decreased ROM and pain.   ACTIVITY LIMITATIONS locomotion level  PARTICIPATION LIMITATIONS: driving and community activity  PERSONAL FACTORS Age, Past/current experiences, Time since onset of injury/illness/exacerbation, and 1-2 comorbidities: hx of foot surgery, arthritis  are also affecting patient's functional outcome.   REHAB POTENTIAL: Good  CLINICAL DECISION MAKING: Stable/uncomplicated  EVALUATION COMPLEXITY: Low  GOALS: Goals reviewed with patient? Yes  SHORT TERM GOALS: Target date: 08/04/21  Patient will be independent with initial HEP. Goal status: INITIAL   LONG TERM GOALS: Target date: 08/25/21   Patient will report at least 75% improvement in bilateral ankle/foot pain to improve QOL. Goal status: INITIAL  2. Patient will demonstrate improved ankle DF AROM to  >= 20d  to allow for normal gait and stair mechanics. Goal status: INITIAL  3.  Patient will demonstrate improved functional LE strength as demonstrated by >= 4/5 bilaterally. Goal status: INITIAL  5.  Patient will report no pain to be able to walk with a normal gait pattern, access community without difficulty, and go from sitting in a car to getting out without pain.  Baseline: 2/10 Goal status: INITIAL   PLAN: PT FREQUENCY: 1x/week  PT DURATION: 6 weeks  PLANNED INTERVENTIONS: Therapeutic exercises, Therapeutic activity, Neuromuscular re-education, Balance training, Gait training, Patient/Family education, Joint mobilization, Dry Needling, Cryotherapy, Moist heat, and Manual therapy  PLAN FOR NEXT SESSION: FOTO, posterior chain LE strengthening, dry needling    Andris Baumann, PT 07/14/2021, 4:16 PM

## 2021-07-20 ENCOUNTER — Ambulatory Visit: Payer: Medicare Other

## 2021-07-20 DIAGNOSIS — M25572 Pain in left ankle and joints of left foot: Secondary | ICD-10-CM | POA: Diagnosis not present

## 2021-07-20 DIAGNOSIS — R279 Unspecified lack of coordination: Secondary | ICD-10-CM | POA: Diagnosis not present

## 2021-07-20 DIAGNOSIS — M722 Plantar fascial fibromatosis: Secondary | ICD-10-CM | POA: Diagnosis not present

## 2021-07-20 DIAGNOSIS — M25571 Pain in right ankle and joints of right foot: Secondary | ICD-10-CM | POA: Diagnosis not present

## 2021-07-20 NOTE — Therapy (Signed)
OUTPATIENT PHYSICAL THERAPY LOWER EXTREMITY TREATMENT   Patient Name: Valerie Snyder MRN: [id number] DOB:05/17/1950, 71 y.o., female Today's Date: 07/20/2021   PT End of Session - 07/20/21 1544     Visit Number 2    Date for PT Re-Evaluation 08/04/21    PT Start Time 1545    PT Stop Time 1629    PT Time Calculation (min) 44 min    Activity Tolerance Patient tolerated treatment well    Behavior During Therapy [facility] for tasks assessed/performed              Past Medical History:  Diagnosis Date   Abnormal uterine bleeding (AUB)    Allergy    Asthma    allergy related   Breast cancer of 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 breast ([place]) 11/13/2014   CAD in native artery 01/15/2019   Asymptomatic coronary calcification on coronary calcium score.  72nd percentile 11/2018.   Complication of anesthesia    10 years ago- pt states she stopped breathing and "had to be bagged"   Concussion 2018   GERD (gastroesophageal reflux disease)    H/O bilateral inguinal hernia repair 1992   Hypercholesteremia    Hypothyroidism    Migraines    Personal history of radiation therapy    Plantar fasciitis, bilateral    Pure hypercholesterolemia 10/10/2018   Radiation 01/12/15-02/04/15   right breast 42.17 gray   Spinal stenosis    Thyroid disease    Past Surgical History:  Procedure Laterality Date   BLADDER SUSPENSION     BLEPHAROPLASTY Bilateral    BREAST LUMPECTOMY Right 12/10/2014   BREAST LUMPECTOMY WITH RADIOACTIVE SEED LOCALIZATION Right 12/10/2014   Procedure: RIGHT BREAST LUMPECTOMY WITH RADIOACTIVE SEED LOCALIZATION;  Surgeon: Autumn Messing III, MD;  Location: [place];  Service: General;  Laterality: Right;   CHOLECYSTECTOMY N/A 02/20/2021   Procedure: LAPAROSCOPIC CHOLECYSTECTOMY;  Surgeon: Ralene Ok, MD;  Location: [place];  Service: General;  Laterality: N/A;   [place] N/A 07/15/2020   Procedure: DILATATION &  CURETTAGE/HYSTEROSCOPY Everlene Balls USING MYOSURE;  Surgeon: Christophe Louis, MD;  Location: [place];  Service: Gynecology;  Laterality: N/A;   FOOT SURGERY     plantar fashiectomies from both feet   HERNIA REPAIR     HYSTEROSCOPY WITH D & C N/A 12/27/2017   Procedure: DILATATION AND CURETTAGE /HYSTEROSCOPY;  Surgeon: Janyth Pupa, DO;  Location: [place];  Service: Gynecology;  Laterality: N/A;   NASAL SEPTUM SURGERY     neuromas removed from right foot     osteoma  1991   removal from right orbital   POLYPECTOMY  12/27/2017   Procedure: POLYPECTOMY;  Surgeon: Janyth Pupa, DO;  Location: [place];  Service: Gynecology;;   removal of basal cell carcinoma (nose)     TONSILECTOMY/ADENOIDECTOMY WITH MYRINGOTOMY     TONSILLECTOMY     Patient Active Problem List   Diagnosis Date Noted   Cholecystitis 02/19/2021   CAD in native artery 01/15/2019   Pure hypercholesterolemia 10/10/2018   Genetic testing 12/10/2014   Breast cancer of 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 breast ([place]) 11/13/2014   Allergic rhinoconjunctivitis 10/22/2014   Asthma 10/22/2014   History of basal cell cancer 03/21/2014   Visit for wound check 07/29/2013   Visit for suture removal 07/11/2013   Basal cell carcinoma of nasal tip 07/04/2013   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   Neoplasm of uncertain behavior of skin 06/19/2013  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   Lentigo 12/02/2011   Melanocytic nevi of trunk 12/02/2011   SK (seborrheic keratosis) 12/02/2011   BCC (basal cell carcinoma) 01/29/1898    PCP: Peter Martinique  REFERRING PROVIDER: Peter Martinique  REFERRING DIAG: M72.2  THERAPY DIAG:  Pain in left ankle and joints of left foot  Plantar fasciitis, bilateral  Unspecified lack of coordination  Rationale for Evaluation and Treatment Rehabilitation  ONSET DATE: 06/25/21  SUBJECTIVE:   SUBJECTIVE STATEMENT: Patient reports no pain and states that  it seems like it is positional and happens when she gets out the car for 10-12 steps.   PERTINENT HISTORY: Breast cancer survivor  Concussion 2018 Galbladder sx January 2023 Sx on L hand for osteoarthritis  2 neuromas removed from R foot    PAIN:  Are you having pain? Yes: NPRS scale: 0/10 Pain location: medial L foot and lateral ankle, and in R foot  Pain description: "phantom pain that comes and goes", burning Aggravating factors: sitting in the car and getting out Relieving factors: ice, roller, frozen water bottle   PRECAUTIONS: Fall  WEIGHT BEARING RESTRICTIONS No  FALLS:  Has patient fallen in last 6 months? 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, hospital  PLOF: Independent  PATIENT GOALS pain free, strengthening, preventative measures if possible    OBJECTIVE:   DIAGNOSTIC FINDINGS: unremarkable and no significant findings     PATIENT SURVEYS:  FOTO TBD  COGNITION:  Overall cognitive status: Within functional limits for tasks assessed and Impaired: Memory: Impaired: difficulty recalling things that happened years ago, may be lingering effects of concussion in 2018      SENSATION: WFL  POSTURE: rounded shoulders and forward head  LOWER EXTREMITY ROM: WFL for all other  Active ROM Right eval Left eval  Hip flexion    Hip extension    Hip abduction    Hip adduction    Hip internal rotation    Hip external rotation    Knee flexion    Knee extension    Ankle dorsiflexion 10d 17d  Ankle plantarflexion    Ankle inversion    Ankle eversion     (Blank rows = not tested)  LOWER EXTREMITY MMT:  MMT Right eval Left eval  Hip flexion 4 4-  Hip extension    Hip  abduction    Hip adduction    Hip internal rotation    Hip external rotation    Knee flexion 4 4  Knee extension 4 4  Ankle dorsiflexion 4 4  Ankle plantarflexion 4- 4-  Ankle inversion 4 4  Ankle eversion 4 4   (Blank rows = not tested)  GASTROC STRENGTH  R 25  L 25  FUNCTIONAL TESTS:  5 times sit to stand: 11.47s Timed up and go (TUG): 10.08s SL 10s Bilaterally    TODAY'S TREATMENT:  07/20/21 Elipitical L2, 36mns PROM ankle DF  Gastroc stretch on slant SL heel raises 20 reps  Tandem on foam   L 27s  R 22s SLS on foam  L 8s  R 5s   Tandem walking 6 steps at best  Toe walking Heel walking    PATIENT EDUCATION:  Education details: POC Person educated: Patient Education method: ECustomer service manager  Education comprehension: verbalized understanding and returned demonstration   HOME EXERCISE PROGRAM: [id number]  ASSESSMENT:  CLINICAL IMPRESSION: Pain after PROM when getting up from table and needs to take a few steps for it to go away. She does better with single leg activities on her L side and says it may be due to the advanced arthritis in her R foot.  Patient has random bouts of what she calls "phantom pain" throughout the session that comes and goes without any real pattern and she has to take steps for it to go away. She is able to complete all activities today despite the pain.     OBJECTIVE IMPAIRMENTS decreased ROM and pain.   ACTIVITY LIMITATIONS locomotion level  PARTICIPATION LIMITATIONS: driving and community activity  PERSONAL FACTORS Age, Past/current experiences, Time since onset of injury/illness/exacerbation, and 1-2 comorbidities: hx of foot surgery, arthritis  are also affecting patient's functional outcome.   REHAB POTENTIAL: Good  CLINICAL DECISION MAKING: Stable/uncomplicated  EVALUATION COMPLEXITY: Low  GOALS: Goals reviewed with patient? Yes  SHORT TERM GOALS: Target date: 08/04/21  Patient will be independent with  initial HEP. Goal status: INITIAL   LONG TERM GOALS: Target date: 08/25/21   Patient will report at least 75% improvement in bilateral ankle/foot pain to improve QOL. Goal status: INITIAL  2. Patient will demonstrate improved ankle DF AROM to  >= 20d  to allow for normal gait and stair mechanics. Goal status: INITIAL  3.  Patient will demonstrate improved functional LE strength as demonstrated by >= 4/5 bilaterally. Goal status: INITIAL  5.  Patient will report no pain to be able to walk with a normal gait pattern, access community without difficulty, and go from sitting in a car to getting out without pain.  Baseline: 2/10 Goal status: INITIAL   PLAN: PT FREQUENCY: 1x/week  PT DURATION: 6 weeks  PLANNED INTERVENTIONS: Therapeutic exercises, Therapeutic activity, Neuromuscular re-education, Balance training, Gait training, Patient/Family education, Joint mobilization, Dry Needling, Cryotherapy, Moist heat, and Manual therapy  PLAN FOR NEXT SESSION: FOTO, posterior chain LE strengthening, dry needling    Andris Baumann, PT 07/20/2021, 4:30 PM

## 2021-07-26 ENCOUNTER — Ambulatory Visit: Payer: Medicare Other

## 2021-07-26 DIAGNOSIS — M25571 Pain in right ankle and joints of right foot: Secondary | ICD-10-CM

## 2021-07-26 DIAGNOSIS — M722 Plantar fascial fibromatosis: Secondary | ICD-10-CM | POA: Diagnosis not present

## 2021-07-26 DIAGNOSIS — R279 Unspecified lack of coordination: Secondary | ICD-10-CM

## 2021-07-26 DIAGNOSIS — M25572 Pain in left ankle and joints of left foot: Secondary | ICD-10-CM

## 2021-07-27 ENCOUNTER — Ambulatory Visit (HOSPITAL_COMMUNITY)
Admission: RE | Admit: 2021-07-27 | Discharge: 2021-07-27 | Disposition: A | Discharge status: Home / Self Care | Payer: Medicare Other | Source: Ambulatory Visit | Attending: Gastroenterology | Admitting: Gastroenterology

## 2021-07-27 DIAGNOSIS — R140 Abdominal distension (gaseous): Secondary | ICD-10-CM | POA: Insufficient documentation

## 2021-07-27 DIAGNOSIS — R6881 Early satiety: Secondary | ICD-10-CM | POA: Diagnosis not present

## 2021-07-27 DIAGNOSIS — R110 Nausea: Secondary | ICD-10-CM | POA: Diagnosis not present

## 2021-07-27 MED ORDER — TECHNETIUM TC 99M SULFUR COLLOID
2.0000 | Freq: Once | INTRAVENOUS | Status: AC | PRN
  Administered 2021-07-27: 2 via ORAL

## 2021-08-04 ENCOUNTER — Ambulatory Visit: Payer: Medicare Other | Attending: Podiatry

## 2021-08-04 DIAGNOSIS — M25572 Pain in left ankle and joints of left foot: Secondary | ICD-10-CM | POA: Insufficient documentation

## 2021-08-04 DIAGNOSIS — M722 Plantar fascial fibromatosis: Secondary | ICD-10-CM | POA: Insufficient documentation

## 2021-08-04 DIAGNOSIS — R279 Unspecified lack of coordination: Secondary | ICD-10-CM | POA: Diagnosis not present

## 2021-08-04 DIAGNOSIS — M25571 Pain in right ankle and joints of right foot: Secondary | ICD-10-CM | POA: Diagnosis not present

## 2021-08-04 NOTE — Therapy (Signed)
OUTPATIENT PHYSICAL THERAPY LOWER EXTREMITY TREATMENT   Patient Name: Valerie Snyder MRN: [id number] DOB:27-Jan-1951, 71 y.o., female Today's Date: 08/04/2021   PT End of Session - 08/04/21 1400     Visit Number 4    Date for PT Re-Evaluation 08/04/21    PT Start Time 1400    PT Stop Time 1444    PT Time Calculation (min) 44 min    Activity Tolerance Patient tolerated treatment well    Behavior During Therapy [facility] for tasks assessed/performed                Past Medical History:  Diagnosis Date   Abnormal uterine bleeding (AUB)    Allergy    Asthma    allergy related   Breast cancer of 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 breast ([place]) 11/13/2014   CAD in native artery 01/15/2019   Asymptomatic coronary calcification on coronary calcium score.  72nd percentile 11/2018.   Complication of anesthesia    10 years ago- pt states she stopped breathing and "had to be bagged"   Concussion 2018   GERD (gastroesophageal reflux disease)    H/O bilateral inguinal hernia repair 1992   Hypercholesteremia    Hypothyroidism    Migraines    Personal history of radiation therapy    Plantar fasciitis, bilateral    Pure hypercholesterolemia 10/10/2018   Radiation 01/12/15-02/04/15   right breast 42.17 gray   Spinal stenosis    Thyroid disease    Past Surgical History:  Procedure Laterality Date   BLADDER SUSPENSION     BLEPHAROPLASTY Bilateral    BREAST LUMPECTOMY Right 12/10/2014   BREAST LUMPECTOMY WITH RADIOACTIVE SEED LOCALIZATION Right 12/10/2014   Procedure: RIGHT BREAST LUMPECTOMY WITH RADIOACTIVE SEED LOCALIZATION;  Surgeon: Autumn Messing III, MD;  Location: [place];  Service: General;  Laterality: Right;   CHOLECYSTECTOMY N/A 02/20/2021   Procedure: LAPAROSCOPIC CHOLECYSTECTOMY;  Surgeon: Ralene Ok, MD;  Location: [place];  Service: General;  Laterality: N/A;   [place] N/A 07/15/2020   Procedure: DILATATION &  CURETTAGE/HYSTEROSCOPY Everlene Balls USING MYOSURE;  Surgeon: Christophe Louis, MD;  Location: [place];  Service: Gynecology;  Laterality: N/A;   FOOT SURGERY     plantar fashiectomies from both feet   HERNIA REPAIR     HYSTEROSCOPY WITH D & C N/A 12/27/2017   Procedure: DILATATION AND CURETTAGE /HYSTEROSCOPY;  Surgeon: Janyth Pupa, DO;  Location: [place];  Service: Gynecology;  Laterality: N/A;   NASAL SEPTUM SURGERY     neuromas removed from right foot     osteoma  1991   removal from right orbital   POLYPECTOMY  12/27/2017   Procedure: POLYPECTOMY;  Surgeon: Janyth Pupa, DO;  Location: [place];  Service: Gynecology;;   removal of basal cell carcinoma (nose)     TONSILECTOMY/ADENOIDECTOMY WITH MYRINGOTOMY     TONSILLECTOMY     Patient Active Problem List   Diagnosis Date Noted   Cholecystitis 02/19/2021   CAD in native artery 01/15/2019   Pure hypercholesterolemia 10/10/2018   Genetic testing 12/10/2014   Breast cancer of 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 breast ([place]) 11/13/2014   Allergic rhinoconjunctivitis 10/22/2014   Asthma 10/22/2014   History of basal cell cancer 03/21/2014   Visit for wound check 07/29/2013   Visit for suture removal 07/11/2013   Basal cell carcinoma of nasal tip 07/04/2013   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   Neoplasm of uncertain behavior of skin  06/19/2013   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   Lentigo 12/02/2011   Melanocytic nevi of trunk 12/02/2011   SK (seborrheic keratosis) 12/02/2011   BCC (basal cell carcinoma) 01/29/1898    PCP: Peter Martinique  REFERRING PROVIDER: Peter Martinique  REFERRING DIAG: M72.2  THERAPY DIAG:  Pain in left ankle and joints of left foot  Plantar fasciitis, bilateral  Unspecified lack of coordination  Rationale for Evaluation and Treatment Rehabilitation  ONSET DATE: 06/25/21  SUBJECTIVE:   SUBJECTIVE STATEMENT: "I am doing well but I am having a  burning pain in my L foot that started once I stepped out of my husband's truck"    PERTINENT HISTORY: Breast cancer survivor  Concussion 2018 Galbladder sx January 2023 Sx on L hand for osteoarthritis  2 neuromas removed from R foot    PAIN:  Are you having pain? Yes: NPRS scale: 0/10 Pain location: medial L foot and lateral ankle, and in R foot  Pain description: "phantom pain that comes and goes", burning Aggravating factors: sitting in the car and getting out Relieving factors: ice, roller, frozen water bottle   PRECAUTIONS: Fall  WEIGHT BEARING RESTRICTIONS No  FALLS:  Has patient fallen in last 6 months? 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, hospital  PLOF: Independent  PATIENT GOALS pain free, strengthening, preventative measures if possible    OBJECTIVE:   DIAGNOSTIC FINDINGS: unremarkable and no significant findings     PATIENT SURVEYS:  FOTO TBD  COGNITION:  Overall cognitive status: Within functional limits for tasks assessed and Impaired: Memory: Impaired: difficulty recalling things that happened years ago, may be lingering effects of concussion in 2018      SENSATION: WFL  POSTURE: rounded shoulders and forward head  LOWER EXTREMITY ROM: WFL for all other  Active ROM Right eval Left eval  Hip flexion    Hip extension    Hip abduction    Hip adduction    Hip internal rotation    Hip external rotation    Knee flexion    Knee extension    Ankle dorsiflexion 10d 17d  Ankle plantarflexion    Ankle inversion    Ankle eversion     (Blank rows = not tested)  LOWER EXTREMITY MMT:  MMT Right eval Left eval  Hip flexion 4 4-  Hip extension    Hip abduction     Hip adduction    Hip internal rotation    Hip external rotation    Knee flexion 4 4  Knee extension 4 4  Ankle dorsiflexion 4 4  Ankle plantarflexion 4- 4-  Ankle inversion 4 4  Ankle eversion 4 4   (Blank rows = not tested)  GASTROC STRENGTH  R 25  L 25  FUNCTIONAL TESTS:  5 times sit to stand: 11.47s Timed up and go (TUG): 10.08s SL 10s Bilaterally    TODAY'S TREATMENT:  08/04/21    Eliptical L2, 23mns  PROM all ways L ankle  Ankle TB DF/PF 2x10  Ankle TB inv/ev x10  Heel to toe raises 2x15  Ankle DF with 5#KB on chair 20 reps   Gastroc stretch on slant board 148m  SLS on foam    L 13s   R 6s   Heel  walking  Toe walking   Forward lunges on BOSU x10 BLE  SL on BOSU w/2HHA finger taps      07/26/21 Elipitical L2, 14mns PROM ankle DF, STM to plantar fascia    Toe yoga Gastroc stretch on slant heel raises 20 reps with ball between heels Ankle DF against wall  SLS on foam  L 8s  R 6s Tandem walking 8 steps at best     07/20/21 Elipitical L2, 56ms PROM ankle DF  Gastroc stretch on slant SL heel raises 20 reps  Tandem on foam   L 27s  R 22s SLS on foam  L 8s  R 5s   Tandem walking 6 steps at best  Toe walking Heel walking    PATIENT EDUCATION:  Education details: POC Person educated: Patient Education method: ExCustomer service managerducation comprehension: verbalized understanding and returned demonstration   HOME EXERCISE PROGRAM: [id number]:  CLINICAL IMPRESSION: Difficulty with ankle theraband exercise moving foot into inversion and eversion. Good progress with DF ROM on L foot, able to reach 20d.    OBJECTIVE IMPAIRMENTS decreased ROM and pain.   ACTIVITY LIMITATIONS locomotion level  PARTICIPATION LIMITATIONS: driving and community activity  PERSONAL FACTORS Age, Past/current experiences, Time since onset of injury/illness/exacerbation, and 1-2 comorbidities: hx of foot surgery, arthritis  are also  affecting patient's functional outcome.   REHAB POTENTIAL: Good  CLINICAL DECISION MAKING: Stable/uncomplicated  EVALUATION COMPLEXITY: Low  GOALS: Goals reviewed with patient? Yes  SHORT TERM GOALS: Target date: 08/04/21  Patient will be independent with initial HEP. Goal status: INITIAL   LONG TERM GOALS: Target date: 08/25/21   Patient will report at least 75% improvement in bilateral ankle/foot pain to improve QOL. Goal status: INITIAL  2. Patient will demonstrate improved ankle DF AROM to  >= 20d  to allow for normal gait and stair mechanics. Goal status: INITIAL  3.  Patient will demonstrate improved functional LE strength as demonstrated by >= 4/5 bilaterally. Goal status: INITIAL  5.  Patient will report no pain to be able to walk with a normal gait pattern, access community without difficulty, and go from sitting in a car to getting out without pain.  Baseline: 2/10 Goal status: INITIAL   PLAN: PT FREQUENCY: 1x/week  PT DURATION: 6 weeks  PLANNED INTERVENTIONS: Therapeutic exercises, Therapeutic activity, Neuromuscular re-education, Balance training, Gait training, Patient/Family education, Joint mobilization, Dry Needling, Cryotherapy, Moist heat, and Manual therapy  PLAN FOR NEXT SESSION: DN, modalities to help with pain, ankle strengthening   MoAndris BaumannPT 08/04/2021, 2:46 PM

## 2021-08-06 DIAGNOSIS — R1013 Epigastric pain: Secondary | ICD-10-CM | POA: Diagnosis not present

## 2021-08-09 ENCOUNTER — Encounter: Payer: Self-pay | Admitting: Physical Therapy

## 2021-08-09 ENCOUNTER — Ambulatory Visit: Payer: Medicare Other | Admitting: Physical Therapy

## 2021-08-09 DIAGNOSIS — M25572 Pain in left ankle and joints of left foot: Secondary | ICD-10-CM

## 2021-08-09 DIAGNOSIS — M25571 Pain in right ankle and joints of right foot: Secondary | ICD-10-CM

## 2021-08-09 DIAGNOSIS — R279 Unspecified lack of coordination: Secondary | ICD-10-CM

## 2021-08-09 DIAGNOSIS — M722 Plantar fascial fibromatosis: Secondary | ICD-10-CM | POA: Diagnosis not present

## 2021-08-09 NOTE — Therapy (Signed)
OUTPATIENT PHYSICAL THERAPY LOWER EXTREMITY TREATMENT   Patient Name: Valerie Snyder MRN: [id number] DOB:1951/01/29, 71 y.o., female Today's Date: 08/09/2021   PT End of Session - 08/09/21 1310     Visit Number 5    Date for PT Re-Evaluation 08/04/21    PT Start Time 1300    PT Stop Time 1347    PT Time Calculation (min) 47 min    Activity Tolerance Patient tolerated treatment well    Behavior During Therapy [facility] for tasks assessed/performed                Past Medical History:  Diagnosis Date   Abnormal uterine bleeding (AUB)    Allergy    Asthma    allergy related   Breast cancer of 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 breast ([place]) 11/13/2014   CAD in native artery 01/15/2019   Asymptomatic coronary calcification on coronary calcium score.  72nd percentile 11/2018.   Complication of anesthesia    10 years ago- pt states she stopped breathing and "had to be bagged"   Concussion 2018   GERD (gastroesophageal reflux disease)    H/O bilateral inguinal hernia repair 1992   Hypercholesteremia    Hypothyroidism    Migraines    Personal history of radiation therapy    Plantar fasciitis, bilateral    Pure hypercholesterolemia 10/10/2018   Radiation 01/12/15-02/04/15   right breast 42.17 gray   Spinal stenosis    Thyroid disease    Past Surgical History:  Procedure Laterality Date   BLADDER SUSPENSION     BLEPHAROPLASTY Bilateral    BREAST LUMPECTOMY Right 12/10/2014   BREAST LUMPECTOMY WITH RADIOACTIVE SEED LOCALIZATION Right 12/10/2014   Procedure: RIGHT BREAST LUMPECTOMY WITH RADIOACTIVE SEED LOCALIZATION;  Surgeon: Autumn Messing III, MD;  Location: [place];  Service: General;  Laterality: Right;   CHOLECYSTECTOMY N/A 02/20/2021   Procedure: LAPAROSCOPIC CHOLECYSTECTOMY;  Surgeon: Ralene Ok, MD;  Location: [place];  Service: General;  Laterality: N/A;   [place] N/A 07/15/2020   Procedure: DILATATION &  CURETTAGE/HYSTEROSCOPY Everlene Balls USING MYOSURE;  Surgeon: Christophe Louis, MD;  Location: [place];  Service: Gynecology;  Laterality: N/A;   FOOT SURGERY     plantar fashiectomies from both feet   HERNIA REPAIR     HYSTEROSCOPY WITH D & C N/A 12/27/2017   Procedure: DILATATION AND CURETTAGE /HYSTEROSCOPY;  Surgeon: Janyth Pupa, DO;  Location: [place];  Service: Gynecology;  Laterality: N/A;   NASAL SEPTUM SURGERY     neuromas removed from right foot     osteoma  1991   removal from right orbital   POLYPECTOMY  12/27/2017   Procedure: POLYPECTOMY;  Surgeon: Janyth Pupa, DO;  Location: [place];  Service: Gynecology;;   removal of basal cell carcinoma (nose)     TONSILECTOMY/ADENOIDECTOMY WITH MYRINGOTOMY     TONSILLECTOMY     Patient Active Problem List   Diagnosis Date Noted   Cholecystitis 02/19/2021   CAD in native artery 01/15/2019   Pure hypercholesterolemia 10/10/2018   Genetic testing 12/10/2014   Breast cancer of 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 breast ([place]) 11/13/2014   Allergic rhinoconjunctivitis 10/22/2014   Asthma 10/22/2014   History of basal cell cancer 03/21/2014   Visit for wound check 07/29/2013   Visit for suture removal 07/11/2013   Basal cell carcinoma of nasal tip 07/04/2013   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   Neoplasm of uncertain behavior of skin  06/19/2013   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   Lentigo 12/02/2011   Melanocytic nevi of trunk 12/02/2011   SK (seborrheic keratosis) 12/02/2011   BCC (basal cell carcinoma) 01/29/1898    PCP: Peter Martinique  REFERRING PROVIDER: Celesta Gentile, DPM  REFERRING DIAG: [phone]  THERAPY DIAG:  Pain in left ankle and joints of left foot  Plantar fasciitis, bilateral  Unspecified lack of coordination  Pain in right ankle and joints of right foot  Rationale for Evaluation and Treatment Rehabilitation  ONSET DATE: 06/25/21  SUBJECTIVE:    SUBJECTIVE STATEMENT: Doing okay, sometimes the pain just comes and goes.  I am not wearing the inserts today.  Getting out of the truck does bother me th most, but sometimes while sitting it will hurt PERTINENT HISTORY: Breast cancer survivor  Concussion 2018 Galbladder sx January 2023 Sx on L hand for osteoarthritis  2 neuromas removed from R foot    PAIN:  Are you having pain? Yes: NPRS scale: 0/10 Pain location: medial L foot and lateral ankle, and in R foot  Pain description: "phantom pain that comes and goes", burning Aggravating factors: sitting in the car and getting out Relieving factors: ice, roller, frozen water bottle   PRECAUTIONS: Fall  WEIGHT BEARING RESTRICTIONS No  FALLS:  Has patient fallen in last 6 months? 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, hospital  PLOF: Independent  PATIENT GOALS pain free, strengthening, preventative measures if possible    OBJECTIVE:   DIAGNOSTIC FINDINGS: unremarkable and no significant findings     PATIENT SURVEYS:  FOTO TBD  COGNITION:  Overall cognitive status: Within functional limits for tasks assessed and Impaired: Memory: Impaired: difficulty recalling things that happened years ago, may be lingering effects of concussion in 2018      SENSATION: WFL  POSTURE: rounded shoulders and forward head  LOWER EXTREMITY ROM: WFL for all other  Active ROM Right eval Left eval  Hip flexion    Hip extension    Hip abduction    Hip adduction    Hip internal rotation    Hip external rotation    Knee flexion    Knee extension    Ankle dorsiflexion 10d 17d  Ankle plantarflexion    Ankle inversion    Ankle eversion     (Blank  rows = not tested)  LOWER EXTREMITY MMT:  MMT Right eval Left eval  Hip flexion 4 4-  Hip extension    Hip abduction    Hip adduction    Hip internal rotation    Hip external rotation    Knee flexion 4 4  Knee extension 4 4  Ankle dorsiflexion 4 4  Ankle plantarflexion 4- 4-  Ankle inversion 4 4  Ankle eversion 4 4   (Blank rows = not tested)  GASTROC STRENGTH  R 25  L 25  FUNCTIONAL TESTS:  5 times sit to stand: 11.47s Timed up and go (TUG): 10.08s SL 10s Bilaterally    TODAY'S TREATMENT:  08/09/21    Elliptical x 5 minutes, level 3   Sitting dynadisc all ankle motions  Red TB all ankle motions 2x15  Gastroc stretches  Towel scrunches left foot  [place] 3MHz 100% 1.3w/cm   STM to the plantar aspect  Ktape to the PF and then one to support the navicular along the posterior tibialis   08/04/21    Eliptical L2, 52mins  PROM all ways L ankle  Ankle TB DF/PF 2x10  Ankle TB inv/ev x10  Heel to toe raises 2x15  Ankle DF with 5#KB on chair 20 reps   Gastroc stretch on slant board 19min  SLS on foam    L 13s   R 6s   Heel walking  Toe walking   Forward lunges on BOSU x10 BLE  SL on BOSU w/2HHA finger taps      07/26/21 Elipitical L2, 6mins PROM ankle DF, STM to plantar fascia    Toe yoga Gastroc stretch on slant heel raises 20 reps with ball between heels Ankle DF against wall  SLS on foam  L 8s  R 6s Tandem walking 8 steps at best     07/20/21 Elipitical L2, 64mins PROM ankle DF  Gastroc stretch on slant SL heel raises 20 reps  Tandem on foam   L 27s  R 22s SLS on foam  L 8s  R 5s   Tandem walking 6 steps at best  Toe walking Heel walking    PATIENT EDUCATION:  Education details: POC Person educated: Patient Education method: Customer service manager Education comprehension: verbalized understanding and returned demonstration   HOME EXERCISE PROGRAM: [id number]  ASSESSMENT:  CLINICAL IMPRESSION: Difficulty with ankle theraband  exercise moving foot into inversion and eversion. Good progress with DF ROM on L foot, able to reach 20d.    OBJECTIVE IMPAIRMENTS decreased ROM and pain.   ACTIVITY LIMITATIONS locomotion level  PARTICIPATION LIMITATIONS: driving and community activity  PERSONAL FACTORS Age, Past/current experiences, Time since onset of injury/illness/exacerbation, and 1-2 comorbidities: hx of foot surgery, arthritis  are also affecting patient's functional outcome.   REHAB POTENTIAL: Good  CLINICAL DECISION MAKING: Stable/uncomplicated  EVALUATION COMPLEXITY: Low  GOALS: Goals reviewed with patient? Yes  SHORT TERM GOALS: Target date: 08/04/21  Patient will be independent with initial HEP. Goal status:met   LONG TERM GOALS: Target date: 08/25/21   Patient will report at least 75% improvement in bilateral ankle/foot pain to improve QOL. Goal status: INITIAL  2. Patient will demonstrate improved ankle DF AROM to  >= 20d  to allow for normal gait and stair mechanics. Goal status: INITIAL  3.  Patient will demonstrate improved functional LE strength as demonstrated by >= 4/5 bilaterally. Goal status: INITIAL  5.  Patient will report no pain to be able to walk with a normal gait pattern, access community without difficulty, and go from sitting in a car to getting out without pain.  Baseline: 2/10 Goal status: INITIAL   PLAN: PT FREQUENCY: 1x/week  PT DURATION: 6 weeks  PLANNED INTERVENTIONS: Therapeutic exercises, Therapeutic activity, Neuromuscular re-education, Balance training, Gait training, Patient/Family education, Joint mobilization, Dry Needling, Cryotherapy, Moist heat, and Manual therapy  PLAN FOR NEXT SESSION: added some [place], STM and taping, could add dry needling   Fermin Yan W, PT 08/09/2021, 1:11 PM

## 2021-08-17 NOTE — Therapy (Signed)
OUTPATIENT PHYSICAL THERAPY LOWER EXTREMITY TREATMENT   Patient Name: Valerie Snyder MRN: [id number] DOB:September 30, 1950, 71 y.o., female Today's Date: 08/18/2021   PT End of Session - 08/18/21 1307     Visit Number 6    Date for PT Re-Evaluation 08/04/21    PT Start Time 1315    PT Stop Time 1400    PT Time Calculation (min) 45 min    Activity Tolerance Patient tolerated treatment well    Behavior During Therapy [facility] for tasks assessed/performed                 Past Medical History:  Diagnosis Date   Abnormal uterine bleeding (AUB)    Allergy    Asthma    allergy related   Breast cancer of 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 breast ([place]) 11/13/2014   CAD in native artery 01/15/2019   Asymptomatic coronary calcification on coronary calcium score.  72nd percentile 11/2018.   Complication of anesthesia    10 years ago- pt states she stopped breathing and "had to be bagged"   Concussion 2018   GERD (gastroesophageal reflux disease)    H/O bilateral inguinal hernia repair 1992   Hypercholesteremia    Hypothyroidism    Migraines    Personal history of radiation therapy    Plantar fasciitis, bilateral    Pure hypercholesterolemia 10/10/2018   Radiation 01/12/15-02/04/15   right breast 42.17 gray   Spinal stenosis    Thyroid disease    Past Surgical History:  Procedure Laterality Date   BLADDER SUSPENSION     BLEPHAROPLASTY Bilateral    BREAST LUMPECTOMY Right 12/10/2014   BREAST LUMPECTOMY WITH RADIOACTIVE SEED LOCALIZATION Right 12/10/2014   Procedure: RIGHT BREAST LUMPECTOMY WITH RADIOACTIVE SEED LOCALIZATION;  Surgeon: Autumn Messing III, MD;  Location: [place];  Service: General;  Laterality: Right;   CHOLECYSTECTOMY N/A 02/20/2021   Procedure: LAPAROSCOPIC CHOLECYSTECTOMY;  Surgeon: Ralene Ok, MD;  Location: [place];  Service: General;  Laterality: N/A;   [place] N/A 07/15/2020   Procedure: DILATATION &  CURETTAGE/HYSTEROSCOPY Everlene Balls USING MYOSURE;  Surgeon: Christophe Louis, MD;  Location: [place];  Service: Gynecology;  Laterality: N/A;   FOOT SURGERY     plantar fashiectomies from both feet   HERNIA REPAIR     HYSTEROSCOPY WITH D & C N/A 12/27/2017   Procedure: DILATATION AND CURETTAGE /HYSTEROSCOPY;  Surgeon: Janyth Pupa, DO;  Location: [place];  Service: Gynecology;  Laterality: N/A;   NASAL SEPTUM SURGERY     neuromas removed from right foot     osteoma  1991   removal from right orbital   POLYPECTOMY  12/27/2017   Procedure: POLYPECTOMY;  Surgeon: Janyth Pupa, DO;  Location: [place];  Service: Gynecology;;   removal of basal cell carcinoma (nose)     TONSILECTOMY/ADENOIDECTOMY WITH MYRINGOTOMY     TONSILLECTOMY     Patient Active Problem List   Diagnosis Date Noted   Cholecystitis 02/19/2021   CAD in native artery 01/15/2019   Pure hypercholesterolemia 10/10/2018   Genetic testing 12/10/2014   Breast cancer of 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 breast ([place]) 11/13/2014   Allergic rhinoconjunctivitis 10/22/2014   Asthma 10/22/2014   History of basal cell cancer 03/21/2014   Visit for wound check 07/29/2013   Visit for suture removal 07/11/2013   Basal cell carcinoma of nasal tip 07/04/2013   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   Neoplasm of uncertain behavior of  skin 06/19/2013   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   Lentigo 12/02/2011   Melanocytic nevi of trunk 12/02/2011   SK (seborrheic keratosis) 12/02/2011   BCC (basal cell carcinoma) 01/29/1898    PCP: Peter Martinique  REFERRING PROVIDER: Celesta Gentile, DPM  REFERRING DIAG: [phone]  THERAPY DIAG:  Pain in left ankle and joints of left foot  Plantar fasciitis, bilateral  Unspecified lack of coordination  Pain in right ankle and joints of right foot  Rationale for Evaluation and Treatment Rehabilitation  ONSET DATE: 06/25/21  SUBJECTIVE:    SUBJECTIVE STATEMENT: "I felt really good after last visit, the tape really helped"   PERTINENT HISTORY: Breast cancer survivor  Concussion 2018 Galbladder sx January 2023 Sx on L hand for osteoarthritis  2 neuromas removed from R foot    PAIN:  Are you having pain? Yes: NPRS scale: 0/10 Pain location: medial L foot and lateral ankle, and in R foot  Pain description: "phantom pain that comes and goes", burning Aggravating factors: sitting in the car and getting out Relieving factors: ice, roller, frozen water bottle   PRECAUTIONS: Fall  WEIGHT BEARING RESTRICTIONS No  FALLS:  Has patient fallen in last 6 months? 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, hospital  PLOF: Independent  PATIENT GOALS pain free, strengthening, preventative measures if possible    OBJECTIVE:   DIAGNOSTIC FINDINGS: unremarkable and no significant findings   COGNITION:  Overall cognitive status: Within functional limits for tasks assessed and Impaired: Memory: Impaired: difficulty recalling things that happened years ago, may be lingering effects of concussion in 2018      SENSATION: WFL  POSTURE: rounded shoulders and forward head  LOWER EXTREMITY ROM: WFL for all other  Active ROM Right eval Left eval  Hip flexion    Hip extension    Hip abduction    Hip adduction    Hip internal rotation    Hip external rotation    Knee flexion    Knee extension    Ankle dorsiflexion 10d 17d  Ankle plantarflexion    Ankle inversion    Ankle eversion     (Blank rows = not tested)  LOWER EXTREMITY MMT:  MMT Right eval Left eval  Hip flexion 4 4-  Hip extension    Hip abduction    Hip adduction    Hip  internal rotation    Hip external rotation    Knee flexion 4 4  Knee extension 4 4  Ankle dorsiflexion 4 4  Ankle plantarflexion 4- 4-  Ankle inversion 4 4  Ankle eversion 4 4   (Blank rows = not tested)  GASTROC STRENGTH  R 25  L 25  FUNCTIONAL TESTS:  5 times sit to stand: 11.47s Timed up and go (TUG): 10.08s SL 10s Bilaterally    TODAY'S TREATMENT:  08/18/21  Elliptical L3 x87mins   Heel raises 2x15  SL stance    L 15s   R 27s  Toe yoga  Towel scrunches  Self massage on bilat feet with roller  [place] 3MHz, 100% 1.3w/cm, 53mins  K-tape to L plantar fascia     08/09/21    Elliptical x 5 minutes, level 3   Sitting dynadisc all ankle motions  Red TB all  ankle motions 2x15  Gastroc stretches  Towel scrunches left foot  [place] 3MHz 100% 1.3w/cm   STM to the plantar aspect  Ktape to the PF and then one to support the navicular along the posterior tibialis   08/04/21    Eliptical L2, 70mins  PROM all ways L ankle  Ankle TB DF/PF 2x10  Ankle TB inv/ev x10  Heel to toe raises 2x15  Ankle DF with 5#KB on chair 20 reps   Gastroc stretch on slant board 75min  SLS on foam    L 13s   R 6s   Heel walking  Toe walking   Forward lunges on BOSU x10 BLE  SL on BOSU w/2HHA finger taps      07/26/21 Elipitical L2, 19mins PROM ankle DF, STM to plantar fascia    Toe yoga Gastroc stretch on slant heel raises 20 reps with ball between heels Ankle DF against wall  SLS on foam  L 8s  R 6s Tandem walking 8 steps at best     07/20/21 Elipitical L2, 17mins PROM ankle DF  Gastroc stretch on slant SL heel raises 20 reps  Tandem on foam   L 27s  R 22s SLS on foam  L 8s  R 5s   Tandem walking 6 steps at best  Toe walking Heel walking    PATIENT EDUCATION:  Education details: POC Person educated: Patient Education method: Customer service manager Education comprehension: verbalized understanding and returned demonstration   HOME EXERCISE  PROGRAM: [id number]  ASSESSMENT:  CLINICAL IMPRESSION: Patient returns with decreased pain levels after last visit. States ultrasound, massage, and taping helped a lot and made her feel more fluid. We continued with some foot and ankle strengthening and ended with [place] and K-taping.    OBJECTIVE IMPAIRMENTS decreased ROM and pain.   ACTIVITY LIMITATIONS locomotion level  PARTICIPATION LIMITATIONS: driving and community activity  PERSONAL FACTORS Age, Past/current experiences, Time since onset of injury/illness/exacerbation, and 1-2 comorbidities: hx of foot surgery, arthritis  are also affecting patient's functional outcome.   REHAB POTENTIAL: Good  CLINICAL DECISION MAKING: Stable/uncomplicated  EVALUATION COMPLEXITY: Low  GOALS: Goals reviewed with patient? Yes  SHORT TERM GOALS: Target date: 08/04/21  Patient will be independent with initial HEP. Goal status:met   LONG TERM GOALS: Target date: 08/25/21   Patient will report at least 75% improvement in bilateral ankle/foot pain to improve QOL. Goal status: INITIAL  2. Patient will demonstrate improved ankle DF AROM to  >= 20d  to allow for normal gait and stair mechanics. Goal status: INITIAL  3.  Patient will demonstrate improved functional LE strength as demonstrated by >= 4/5 bilaterally. Goal status: INITIAL  5.  Patient will report no pain to be able to walk with a normal gait pattern, access community without difficulty, and go from sitting in a car to getting out without pain.  Baseline: 2/10 Goal status: INITIAL   PLAN: PT FREQUENCY: 1x/week  PT DURATION: 6 weeks  PLANNED INTERVENTIONS: Therapeutic exercises, Therapeutic activity, Neuromuscular re-education, Balance training, Gait training, Patient/Family education, Joint mobilization, Dry Needling, Cryotherapy, Moist heat, and Manual therapy  PLAN FOR NEXT SESSION: added some [place], STM and taping, could add dry needling   Andris Baumann, PT 08/18/2021, 2:02  PM

## 2021-08-18 ENCOUNTER — Encounter: Payer: Self-pay | Admitting: Cardiology

## 2021-08-18 ENCOUNTER — Ambulatory Visit: Payer: Medicare Other

## 2021-08-18 DIAGNOSIS — M25572 Pain in left ankle and joints of left foot: Secondary | ICD-10-CM

## 2021-08-18 DIAGNOSIS — M25571 Pain in right ankle and joints of right foot: Secondary | ICD-10-CM

## 2021-08-18 DIAGNOSIS — M722 Plantar fascial fibromatosis: Secondary | ICD-10-CM

## 2021-08-18 DIAGNOSIS — R279 Unspecified lack of coordination: Secondary | ICD-10-CM

## 2021-08-19 NOTE — Telephone Encounter (Signed)
Spoke to patient she stated she was looking at her referral to go to foot Dr.and she noticed referral was put in by Dr.Jordan.Stated she has never seen Dr.Jordan.She is Dr.Interlochen's patient.Advised it is a error.

## 2021-08-23 DIAGNOSIS — E7800 Pure hypercholesterolemia, unspecified: Secondary | ICD-10-CM | POA: Diagnosis not present

## 2021-08-24 LAB — LIPID PANEL
Chol/HDL Ratio: 1.7 ratio (ref 0.0–4.4)
Cholesterol, Total: 105 mg/dL (ref 100–199)
HDL: 62 mg/dL (ref >39)
LDL Chol Calc (NIH): 31 mg/dL (ref 0–99)
Triglycerides: 51 mg/dL (ref 0–149)
VLDL Cholesterol Cal: 12 mg/dL (ref 5–40)

## 2021-08-24 LAB — HEPATIC FUNCTION PANEL
ALT: 16 IU/L (ref 0–32)
AST: 22 IU/L (ref 0–40)
Albumin: 4.3 g/dL (ref 3.8–4.8)
Alkaline Phosphatase: 61 IU/L (ref 44–121)
Bilirubin Total: 0.5 mg/dL (ref 0.0–1.2)
Bilirubin, Direct: 0.15 mg/dL (ref 0.00–0.40)
Total Protein: 6.2 g/dL (ref 6.0–8.5)

## 2021-08-26 ENCOUNTER — Ambulatory Visit: Payer: Medicare Other

## 2021-08-26 ENCOUNTER — Encounter: Payer: Self-pay | Admitting: Podiatry

## 2021-08-26 DIAGNOSIS — M25572 Pain in left ankle and joints of left foot: Secondary | ICD-10-CM | POA: Diagnosis not present

## 2021-08-26 DIAGNOSIS — R279 Unspecified lack of coordination: Secondary | ICD-10-CM

## 2021-08-26 DIAGNOSIS — M25571 Pain in right ankle and joints of right foot: Secondary | ICD-10-CM | POA: Diagnosis not present

## 2021-08-26 DIAGNOSIS — M722 Plantar fascial fibromatosis: Secondary | ICD-10-CM

## 2021-08-26 NOTE — Therapy (Signed)
OUTPATIENT PHYSICAL THERAPY LOWER EXTREMITY TREATMENT   Patient Name: Valerie Snyder MRN: [id number] DOB:1950/12/05, 71 y.o., female Today's Date: 08/26/2021   PT End of Session - 08/26/21 1803     Visit Number 7    Date for PT Re-Evaluation 08/04/21    PT Start Time 1801    PT Stop Time 1842    PT Time Calculation (min) 41 min    Activity Tolerance Patient tolerated treatment well    Behavior During Therapy [facility] for tasks assessed/performed                  Past Medical History:  Diagnosis Date   Abnormal uterine bleeding (AUB)    Allergy    Asthma    allergy related   Breast cancer of 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 breast ([place]) 11/13/2014   CAD in native artery 01/15/2019   Asymptomatic coronary calcification on coronary calcium score.  72nd percentile 11/2018.   Complication of anesthesia    10 years ago- pt states she stopped breathing and "had to be bagged"   Concussion 2018   GERD (gastroesophageal reflux disease)    H/O bilateral inguinal hernia repair 1992   Hypercholesteremia    Hypothyroidism    Migraines    Personal history of radiation therapy    Plantar fasciitis, bilateral    Pure hypercholesterolemia 10/10/2018   Radiation 01/12/15-02/04/15   right breast 42.17 gray   Spinal stenosis    Thyroid disease    Past Surgical History:  Procedure Laterality Date   BLADDER SUSPENSION     BLEPHAROPLASTY Bilateral    BREAST LUMPECTOMY Right 12/10/2014   BREAST LUMPECTOMY WITH RADIOACTIVE SEED LOCALIZATION Right 12/10/2014   Procedure: RIGHT BREAST LUMPECTOMY WITH RADIOACTIVE SEED LOCALIZATION;  Surgeon: Autumn Messing III, MD;  Location: [place];  Service: General;  Laterality: Right;   CHOLECYSTECTOMY N/A 02/20/2021   Procedure: LAPAROSCOPIC CHOLECYSTECTOMY;  Surgeon: Ralene Ok, MD;  Location: [place];  Service: General;  Laterality: N/A;   [place] N/A 07/15/2020   Procedure: DILATATION &  CURETTAGE/HYSTEROSCOPY Everlene Balls USING MYOSURE;  Surgeon: Christophe Louis, MD;  Location: [place];  Service: Gynecology;  Laterality: N/A;   FOOT SURGERY     plantar fashiectomies from both feet   HERNIA REPAIR     HYSTEROSCOPY WITH D & C N/A 12/27/2017   Procedure: DILATATION AND CURETTAGE /HYSTEROSCOPY;  Surgeon: Janyth Pupa, DO;  Location: [place];  Service: Gynecology;  Laterality: N/A;   NASAL SEPTUM SURGERY     neuromas removed from right foot     osteoma  1991   removal from right orbital   POLYPECTOMY  12/27/2017   Procedure: POLYPECTOMY;  Surgeon: Janyth Pupa, DO;  Location: [place];  Service: Gynecology;;   removal of basal cell carcinoma (nose)     TONSILECTOMY/ADENOIDECTOMY WITH MYRINGOTOMY     TONSILLECTOMY     Patient Active Problem List   Diagnosis Date Noted   Cholecystitis 02/19/2021   CAD in native artery 01/15/2019   Pure hypercholesterolemia 10/10/2018   Genetic testing 12/10/2014   Breast cancer of 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 breast ([place]) 11/13/2014   Allergic rhinoconjunctivitis 10/22/2014   Asthma 10/22/2014   History of basal cell cancer 03/21/2014   Visit for wound check 07/29/2013   Visit for suture removal 07/11/2013   Basal cell carcinoma of nasal tip 07/04/2013   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   Neoplasm of uncertain behavior  of skin 06/19/2013   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   Lentigo 12/02/2011   Melanocytic nevi of trunk 12/02/2011   SK (seborrheic keratosis) 12/02/2011   BCC (basal cell carcinoma) 01/29/1898    PCP: Peter Martinique  REFERRING PROVIDER: Celesta Gentile, DPM  REFERRING DIAG: [phone]  THERAPY DIAG:  Pain in left ankle and joints of left foot  Pain in right ankle and joints of right foot  Plantar fasciitis, bilateral  Unspecified lack of coordination  Rationale for Evaluation and Treatment Rehabilitation  ONSET DATE: 06/25/21  SUBJECTIVE:    SUBJECTIVE STATEMENT: Pain is not as frequent and not as intense  PERTINENT HISTORY: Breast cancer survivor  Concussion 2018 Galbladder sx January 2023 Sx on L hand for osteoarthritis  2 neuromas removed from R foot    PAIN:  Are you having pain? Yes: NPRS scale: 0/10 Pain location: medial L foot and lateral ankle, and in R foot  Pain description: "phantom pain that comes and goes", burning Aggravating factors: sitting in the car and getting out Relieving factors: ice, roller, frozen water bottle   PRECAUTIONS: Fall  WEIGHT BEARING RESTRICTIONS No  FALLS:  Has patient fallen in last 6 months? 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, hospital  PLOF: Independent  PATIENT GOALS pain free, strengthening, preventative measures if possible    OBJECTIVE:   DIAGNOSTIC FINDINGS: unremarkable and no significant findings   COGNITION:  Overall cognitive status: Within functional limits for tasks assessed and Impaired: Memory: Impaired: difficulty recalling things that happened years ago, may be lingering effects of concussion in 2018      SENSATION: WFL  POSTURE: rounded shoulders and forward head  LOWER EXTREMITY ROM: [facility] for all other  Active ROM Right eval Left eval Right 7/27 Left 7/27  Hip flexion      Hip extension      Hip abduction      Hip adduction      Hip internal rotation      Hip external rotation      Knee flexion      Knee extension      Ankle dorsiflexion 10d 17d 20d 22d  Ankle plantarflexion      Ankle inversion      Ankle eversion       (Blank rows = not tested)  LOWER EXTREMITY MMT:  MMT Right eval Left eval Right 7/27 Left 7/27  Hip flexion 4 4- 5 4+   Hip extension      Hip abduction      Hip adduction      Hip internal rotation      Hip external rotation      Knee flexion 4 4 5 5   Knee extension 4 4 5 5   Ankle dorsiflexion 4 4 5 5   Ankle plantarflexion 4- 4- 4+ 4+  Ankle inversion 4 4 4+ 4+  Ankle eversion 4 4 4+ 4+   (Blank rows = not tested)  GASTROC STRENGTH  R 25  L 25  FUNCTIONAL TESTS:  5 times sit to stand: 11.47s Timed up and go (TUG): 10.08s SL 10s Bilaterally    TODAY'S TREATMENT:  08/26/21  Reviewed goals   Elliptical L3 x47mins  Heel raises 2x15  Soleus raises on leg press  20# 2x10  Resisted gait 20# 4 way x3    08/18/21  Elliptical L3 x40mins   Heel raises 2x15  SL stance    L 15s   R 27s  Toe yoga  Towel scrunches  Self massage on bilat feet with roller  [place] 3MHz, 100% 1.3w/cm, 104mins  K-tape to L plantar fascia     08/09/21    Elliptical x 5 minutes, level 3   Sitting dynadisc all ankle motions  Red TB all ankle motions 2x15  Gastroc stretches  Towel scrunches left foot  [place] 3MHz 100% 1.3w/cm   STM to the plantar aspect  Ktape to the PF and then one to support the navicular along the posterior tibialis   08/04/21    Eliptical L2, 88mins  PROM all ways L ankle  Ankle TB DF/PF 2x10  Ankle TB inv/ev x10  Heel to toe raises 2x15  Ankle DF with 5#KB on chair 20 reps   Gastroc stretch on slant board 72min  SLS on foam    L 13s   R 6s   Heel walking  Toe walking   Forward lunges on BOSU x10 BLE  SL on BOSU w/2HHA finger taps      07/26/21 Elipitical L2, 55mins PROM ankle DF, STM to plantar fascia    Toe yoga Gastroc stretch on slant heel raises 20 reps with ball between heels Ankle DF against wall  SLS on foam  L 8s  R 6s Tandem walking 8 steps at best     07/20/21 Elipitical L2, 51mins PROM ankle DF  Gastroc stretch on slant SL heel raises 20 reps  Tandem on foam   L 27s  R 22s SLS on foam  L 8s  R 5s   Tandem walking 6 steps at best  Toe walking Heel walking     PATIENT EDUCATION:  Education details: POC Person educated: Patient Education method: Customer service manager Education comprehension: verbalized understanding and returned demonstration   HOME EXERCISE PROGRAM: [id number]  ASSESSMENT:  CLINICAL IMPRESSION: Patient has fewer instances of pain when walking and when getting in and out of the truck. Reviewed patient goals, and she has met all of her therapy goals with the exception of pain goal being 0/10. She has made great improvements and is doing a good job keeping up with her HEP at home. We agreed on discharging her and she will call if she has any questions.   OBJECTIVE IMPAIRMENTS decreased ROM and pain.   ACTIVITY LIMITATIONS locomotion level  PARTICIPATION LIMITATIONS: driving and community activity  PERSONAL FACTORS Age, Past/current experiences, Time since onset of injury/illness/exacerbation, and 1-2 comorbidities: hx of foot surgery, arthritis  are also affecting patient's functional outcome.   REHAB POTENTIAL: Good  CLINICAL DECISION MAKING: Stable/uncomplicated  EVALUATION COMPLEXITY: Low  GOALS: Goals reviewed with patient? Yes  SHORT TERM GOALS: Target date: 08/04/21  Patient will be independent with initial HEP. Goal status:met   LONG TERM GOALS: Target date: 08/25/21   Patient will report at least 75% improvement in bilateral ankle/foot pain to improve QOL. Goal status: met  2. Patient will demonstrate improved ankle DF AROM to  >= 20d  to allow for normal gait and stair mechanics. Goal status: MET  3.  Patient will demonstrate improved functional LE strength as demonstrated by >= 4/5 bilaterally. Goal status: MET  5.  Patient will report no pain to be able to walk with a normal gait pattern, access community without difficulty, and go from  sitting in a car to getting out without pain.  Baseline: 2/10 Goal status: IN PROGRESS   PLAN: PT FREQUENCY: 1x/week  PT DURATION: 6  weeks  PLANNED INTERVENTIONS: Therapeutic exercises, Therapeutic activity, Neuromuscular re-education, Balance training, Gait training, Patient/Family education, Joint mobilization, Dry Needling, Cryotherapy, Moist heat, and Manual therapy  PLAN FOR NEXT SESSION: d/c  Andris Baumann, PT 08/26/2021, 6:44 PM

## 2021-09-09 ENCOUNTER — Encounter (HOSPITAL_BASED_OUTPATIENT_CLINIC_OR_DEPARTMENT_OTHER): Payer: Self-pay | Admitting: Cardiovascular Disease

## 2021-09-09 ENCOUNTER — Ambulatory Visit (INDEPENDENT_AMBULATORY_CARE_PROVIDER_SITE_OTHER): Payer: Medicare Other | Admitting: Cardiovascular Disease

## 2021-09-09 VITALS — BP 110/73 | HR 74 | Ht 65.0 in | Wt 140.8 lb

## 2021-09-09 DIAGNOSIS — I2510 Atherosclerotic heart disease of native coronary artery without angina pectoris: Secondary | ICD-10-CM | POA: Diagnosis not present

## 2021-09-09 DIAGNOSIS — E7800 Pure hypercholesterolemia, unspecified: Secondary | ICD-10-CM

## 2021-09-09 NOTE — Progress Notes (Signed)
Cardiology Office Note  Date:  09/09/2021   ID:  Valerie Snyder, DOB [birth year], MRN [id number]  PCP:  No primary care provider on file.  Cardiologist:   Skeet Latch, MD  Rheum: Marella Chimes, PA  No chief complaint on file.  History of Present Illness: Valerie Snyder is a 71 y.o. female with asymptomatic coronary calcification and hyperlipidemia here for follow up.  In April 2020 she developed tachycardia while dancing.   She dances daily for at least 20 minutes.  One day she started feeling like heart was racing while dancing.  The episode lasted for around an hour.  She called her PCP who recommended that she be seen in the ED.  She had no chest pressure, shortness of breath, lightheadedness or dizziness.  By the time she got to the ED her symptoms had subsided.  Telemetry was unremarkable. She has a longstanding history of hyperlipidemia despite having an excellent diet and exercising regularly.  She has a strong family history of CAD and atrial fibrillation.    Valerie Snyder had a coronary calcium score 11/2018 that was 72nd perecentile for age/gender.  She started on Zetia and has tolerated it well.  In the past she tried atorvastatin and rosuvastatin but developed myalgias.  She thinks she has tried other statins as well but cannot remember them.  Her lipids were above goal so pravastatin was added to her regimen. She tolerated this well with no cramping. Valerie Snyder was seen in the ED 01/18/2021 with right lower back pain since the night prior. She had not been taking Celebrex for several days due to withholding it for a scheduled procedure. It was thought her symptoms were related to muscle spasm.  She had a laparoscopic cholecystectomy 01/2021.  She has struggled with her recovery from surgery.  Her GI system still isn't back to normal.  She continues to exercise with PT, planks and push ups.  She also walks for exercise.  She has no exertional chest pain or shortness of breath.  She has no LE edema, orthopnea or PND.  She has been dealing with neuropathy and wonders if she can take alpha lipoic acid.  Past Medical History:  Diagnosis Date   Abnormal uterine bleeding (AUB)    Allergy    Asthma    allergy related   Breast cancer of 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 breast ([place]) 11/13/2014   CAD in native artery 01/15/2019   Asymptomatic coronary calcification on coronary calcium score.  72nd percentile 11/2018.   Complication of anesthesia    10 years ago- pt states she stopped breathing and "had to be bagged"   Concussion 2018   GERD (gastroesophageal reflux disease)    H/O bilateral inguinal hernia repair 1992   Hypercholesteremia    Hypothyroidism    Migraines    Personal history of radiation therapy    Plantar fasciitis, bilateral    Pure hypercholesterolemia 10/10/2018   Radiation 01/12/15-02/04/15   right breast 42.17 gray   Spinal stenosis    Thyroid disease     Past Surgical History:  Procedure Laterality Date   BLADDER SUSPENSION     BLEPHAROPLASTY Bilateral    BREAST LUMPECTOMY Right 12/10/2014   BREAST LUMPECTOMY WITH RADIOACTIVE SEED LOCALIZATION Right 12/10/2014   Procedure: RIGHT BREAST LUMPECTOMY WITH RADIOACTIVE SEED LOCALIZATION;  Surgeon: Autumn Messing III, MD;  Location: [place];  Service: General;  Laterality: Right;   CHOLECYSTECTOMY N/A 02/20/2021   Procedure: LAPAROSCOPIC CHOLECYSTECTOMY;  Surgeon: Ralene Ok,  MD;  Location: [place];  Service: General;  Laterality: N/A;   [place] N/A 07/15/2020   Procedure: DILATATION & CURETTAGE/HYSTEROSCOPY WITHPOLYPECTOMY USING MYOSURE;  Surgeon: Christophe Louis, MD;  Location: [place];  Service: Gynecology;  Laterality: N/A;   FOOT SURGERY     plantar fashiectomies from both feet   HERNIA REPAIR     HYSTEROSCOPY WITH D & C N/A 12/27/2017   Procedure: DILATATION AND CURETTAGE /HYSTEROSCOPY;  Surgeon: Janyth Pupa, DO;   Location: [place];  Service: Gynecology;  Laterality: N/A;   NASAL SEPTUM SURGERY     neuromas removed from right foot     osteoma  1991   removal from right orbital   POLYPECTOMY  12/27/2017   Procedure: POLYPECTOMY;  Surgeon: Janyth Pupa, DO;  Location: [place];  Service: Gynecology;;   removal of basal cell carcinoma (nose)     TONSILECTOMY/ADENOIDECTOMY WITH MYRINGOTOMY     TONSILLECTOMY       Current Outpatient Medications  Medication Sig Dispense Refill   Alirocumab (PRALUENT) 150 MG/ML SOAJ Inject 150 mg into the skin every 14 (fourteen) days. 2 mL 11   celecoxib (CELEBREX) 200 MG capsule Take 200 mg by mouth daily.     cetirizine (ZYRTEC) 10 MG tablet Take 10 mg by mouth daily as needed for allergies.      cholecalciferol (VITAMIN D) 25 MCG (1000 UNIT) tablet Take 1,000 Units by mouth daily.     Cranberry 450 MG CAPS Take 900 mg by mouth daily.      EPINEPHrine (EPIPEN 2-PAK) 0.3 mg/0.3 mL IJ SOAJ injection Inject 0.3 mg into the muscle as needed for anaphylaxis. 1 each 1   hydroxychloroquine (PLAQUENIL) 200 MG tablet Take 200 mg by mouth in the morning and at bedtime.     levothyroxine (SYNTHROID) 75 MCG tablet Take 75 mcg by mouth daily before breakfast.     Magnesium 250 MG TABS Take 1 tablet (250 mg total) by mouth every other day.  0   Misc Natural Products (OSTEO BI-FLEX ADV TRIPLE ST PO) Take 2 tablets by mouth daily.     Omega-3 Fatty Acids (OMEGA-3 FISH OIL PO) Take 2,000 capsules by mouth daily.     pantoprazole (PROTONIX) 40 MG tablet Take 1 tablet by mouth daily at 12 noon.     Probiotic Product (SOLUBLE FIBER/PROBIOTICS PO) Take 1 capsule by mouth daily.     VENTOLIN HFA 108 (90 Base) MCG/ACT inhaler Inhale 2 puffs into the lungs every 4 (four) hours as needed for wheezing or shortness of breath. 18 g 1   vitamin C (ASCORBIC ACID) 250 MG tablet Take 2 tablets (500 mg total) by mouth daily.     zinc gluconate 50 MG tablet Take 1  tablet (50 mg total) by mouth daily.     No current facility-administered medications for this visit.    Allergies:   Bee venom, Cephalexin, Ketorolac tromethamine, Atorvastatin, Rosuvastatin, and Simvastatin    Social History:  The patient  reports that she has never smoked. She has never used smokeless tobacco. She reports that she does not drink alcohol and does not use drugs.   Family History:  The patient's family history includes Aneurysm in her maternal grandmother; Breast cancer in her cousin; Colon polyps in her daughter and mother; Congestive Heart Failure (age of onset: 39) in her mother; Dementia in her father; Depression in her father, sister, and sister; Emphysema in her paternal grandfather;  Heart Problems in her paternal grandfather; Heart attack in her maternal grandfather; Kidney failure in her father; Lung disease in her mother; Multiple sclerosis in her sister; Other in her sister; Pancreatic cancer in her maternal aunt; Stroke in her father; Testicular cancer in her cousin.    ROS:  Please see the history of present illness. (+) Memory disturbance (+) Chronic left foot pain All other systems are reviewed and negative.    PHYSICAL EXAM: VS:  BP 110/73 (BP Location: Left Arm, Patient Position: Sitting, Cuff Size: Normal)   Pulse 74   Ht '5\' 5"'$  (1.651 m)   Wt 140 lb 12.8 oz (63.9 kg)   SpO2 98%   BMI 23.43 kg/m  , BMI Body mass index is 23.43 kg/m. GENERAL:  Well appearing HEENT: Pupils equal round and reactive, fundi not visualized, oral mucosa unremarkable NECK:  No jugular venous distention, waveform within normal limits, carotid upstroke brisk and symmetric, no bruits LUNGS:  Clear to auscultation bilaterally HEART:  RRR.  PMI not displaced or sustained,S1 and S2 within normal limits, no S3, no S4, no clicks, no rubs, no murmurs ABD:  Flat, positive bowel sounds normal in frequency in pitch, no bruits, no rebound, no guarding, no midline pulsatile mass, no  hepatomegaly, no splenomegaly EXT:  2 plus pulses throughout, no edema, no cyanosis no clubbing SKIN:  No rashes no nodules NEURO:  Cranial nerves II through XII grossly intact, motor grossly intact throughout PSYCH:  Cognitively intact, oriented to person place and time  EKG:   01/27/2021: Sinus rhythm. Rate 76 bpm. 01/16/2020: Sinus rhythm.  Rate 73 bpm.  PACs. 10/10/2018: sinus rhythm.  Rate 87 bpm  CT Chest 12/04/2020: COMPARISON:  Chest CT 01/22/2020.   FINDINGS: Cardiovascular: Heart size is normal. There is no significant pericardial fluid, thickening or pericardial calcification. There is aortic atherosclerosis, as well as atherosclerosis of the great vessels of the mediastinum and the coronary arteries, including calcified atherosclerotic plaque in the left main, left anterior descending and left circumflex coronary arteries.   Mediastinum/Nodes: No pathologically enlarged mediastinal, internal mammary or hilar lymph nodes. Esophagus is unremarkable in appearance. No axillary lymphadenopathy.   Lungs/Pleura: 3 mm left upper lobe pulmonary nodule (axial image 52 of series 7), stable, considered benign. No other larger more suspicious appearing pulmonary nodules or masses are noted. No acute consolidative airspace disease. No pleural effusions. Areas of mild chronic linear scarring are noted in the medial aspects of the lower lobes of the lungs bilaterally.   Upper Abdomen: Calcified granuloma in the spleen incidentally noted. Diffuse low attenuation throughout the visualized hepatic parenchyma, suggestive of hepatic steatosis (difficult to say for certain on today's contrast enhanced examination).   Musculoskeletal: There are no aggressive appearing lytic or blastic lesions noted in the visualized portions of the skeleton.   IMPRESSION: 1. No definite evidence of metastatic disease in the thorax. 2. Previously noted left upper lobe 3 mm pulmonary nodule is stable and  considered benign at this time. 3. Aortic atherosclerosis, in addition to left main and 2 vessel coronary artery disease. Assessment for potential risk factor modification, dietary therapy or pharmacologic therapy may be warranted, if clinically indicated. 4. Probable mild hepatic steatosis.   Aortic Atherosclerosis (ICD10-I70.0).  Bilateral LE Venous Reflux 03/28/2019: Summary:  Right:  - No evidence of deep vein thrombosis seen in the right lower extremity,  from the common femoral through the popliteal veins.  - No evidence of superficial venous thrombosis in the right lower  extremity.  -  Venous reflux is noted in the right greater saphenous vein in the calf.     Left:  - No evidence of deep vein thrombosis seen in the left lower extremity,  from the common femoral through the popliteal veins.  - No evidence of superficial venous thrombosis in the left lower  extremity.  - Venous reflux is noted in the left greater saphenous vein in the calf.  - Venous reflux is noted in the left short saphenous vein.   Coronary calcium score 11/07/18: IMPRESSION: Coronary calcium score of 95.93. This was 72nd percentile for age and sex matched control.    Recent Labs: 03/05/2021: BUN 16; Creatinine, Ser 0.80; Hemoglobin 13.7; Platelets 232; Potassium 4.1; Sodium 138 08/23/2021: ALT 16   Lipid Panel    Component Value Date/Time   CHOL 105 08/23/2021 0954   TRIG 51 08/23/2021 0954   HDL 62 08/23/2021 0954   CHOLHDL 1.7 08/23/2021 0954   LDLCALC 31 08/23/2021 0954    Wt Readings from Last 3 Encounters:  09/09/21 140 lb 12.8 oz (63.9 kg)  06/25/21 145 lb 9.6 oz (66 kg)  05/04/21 144 lb 6.4 oz (65.5 kg)     ASSESSMENT AND PLAN:  CAD in native artery Nonobstructive CAD.  She is exercising regularly and has no exertional symptoms.  She is doing very well with Praluent.  Continue working on diet, exercise, and taking the Praluent.  Pure hypercholesterolemia She had a wonderful  response to the Praluent.  Will stop the Zetia.  She also does not need to take co-Q10 given that she is no longer taking a statin.  We will repeat her lipids and a fasting CMP in December.  Continue working on diet and exercise as above.   Current medicines are reviewed at length with the patient today.  The patient does not have concerns regarding medicines.  The following changes have been made:  stop Zetia  Labs/ tests ordered today include:   Orders Placed This Encounter  Procedures   Lipid panel   Comprehensive metabolic panel    Disposition:   FU with Tegan Burnside C. Oval Linsey, MD, [employer] in 1 year   Signed, Annessa Satre C. Oval Linsey, MD, [facility]  09/09/2021 5:42 PM    [facility]

## 2021-09-09 NOTE — Assessment & Plan Note (Signed)
Nonobstructive CAD.  She is exercising regularly and has no exertional symptoms.  She is doing very well with Praluent.  Continue working on diet, exercise, and taking the Praluent.

## 2021-09-09 NOTE — Assessment & Plan Note (Signed)
She had a wonderful response to the Praluent.  Will stop the Zetia.  She also does not need to take co-Q10 given that she is no longer taking a statin.  We will repeat her lipids and a fasting CMP in December.  Continue working on diet and exercise as above.

## 2021-09-09 NOTE — Patient Instructions (Signed)
Medication Instructions:  STOP EZETIMIBE   STOP COQ10  Labwork: FASTING LP/CMET IN DECEMBER   Testing/Procedures: NONE  Follow-Up: Your physician wants you to follow-up in: [place] will receive a reminder letter in the mail two months in advance. If you don't receive a letter, please call our office to schedule the follow-up appointment.  If you need a refill on your cardiac medications before your next appointment, please call your pharmacy.

## 2021-09-10 ENCOUNTER — Encounter: Payer: Self-pay | Admitting: Podiatry

## 2021-09-14 DIAGNOSIS — R1030 Lower abdominal pain, unspecified: Secondary | ICD-10-CM | POA: Diagnosis not present

## 2021-10-06 ENCOUNTER — Ambulatory Visit (HOSPITAL_BASED_OUTPATIENT_CLINIC_OR_DEPARTMENT_OTHER): Payer: Medicare Other | Admitting: Cardiovascular Disease

## 2021-10-21 DIAGNOSIS — Z20822 Contact with and (suspected) exposure to covid-19: Secondary | ICD-10-CM | POA: Diagnosis not present

## 2021-10-21 DIAGNOSIS — J029 Acute pharyngitis, unspecified: Secondary | ICD-10-CM | POA: Diagnosis not present

## 2021-10-21 DIAGNOSIS — M791 Myalgia, unspecified site: Secondary | ICD-10-CM | POA: Diagnosis not present

## 2021-10-21 DIAGNOSIS — R051 Acute cough: Secondary | ICD-10-CM | POA: Diagnosis not present

## 2021-10-21 DIAGNOSIS — K121 Other forms of stomatitis: Secondary | ICD-10-CM | POA: Diagnosis not present

## 2021-10-21 DIAGNOSIS — R5381 Other malaise: Secondary | ICD-10-CM | POA: Diagnosis not present

## 2021-11-15 ENCOUNTER — Other Ambulatory Visit: Payer: Self-pay | Admitting: Hematology and Oncology

## 2021-11-15 DIAGNOSIS — Z1231 Encounter for screening mammogram for malignant neoplasm of breast: Secondary | ICD-10-CM

## 2021-11-22 ENCOUNTER — Telehealth: Payer: Self-pay | Admitting: Allergy and Immunology

## 2021-11-22 NOTE — Telephone Encounter (Signed)
Called and left a voicemail asking for patient to return call to discuss.  °

## 2021-11-22 NOTE — Telephone Encounter (Signed)
Patient had a death in the family and is needing to travel, patient leaves 12/08/2021. Patient will be staying at a family member's house, who has a cat. Patient is unsure if she is still allergic to cats ans would like to know if there is someway to test her before her trip to see if she is still allergic.   Patient states back in 2018 Dr. Neldon Mc gave her a medication as a preventative and would like to know if that same medication can be given to her.   Patient states she has not had a flare up with her allergies and always keeps her rescue inhaler by her.   Best contact number: [phone]

## 2021-11-23 ENCOUNTER — Encounter: Payer: Self-pay | Admitting: Pharmacist Clinician (PhC)/ Clinical Pharmacy Specialist

## 2021-11-23 MED ORDER — ASMANEX HFA 200 MCG/ACT IN AERO: 1.0000 | INHALATION_SPRAY | Freq: Every day | RESPIRATORY_TRACT | 5 refills | Status: DC

## 2021-11-23 NOTE — Telephone Encounter (Signed)
Patient called back, patient states she was reached at her cell phone number instead of her phone number which she provided. Patient wants to make sure she is reached at her home number: [phone]

## 2021-11-23 NOTE — Telephone Encounter (Signed)
Spoke with pt and relayed Dr. Lyndon Code message:  She was allergic to cat and skin testing 2017. Can use nasal steroid consistently and can use a inhaled steroid such as Asmanex 220 one inhalation one time per day starting a week before and during cat exposure.    Sent Rx to [facility] on file as pt requested.

## 2021-11-24 ENCOUNTER — Other Ambulatory Visit (HOSPITAL_COMMUNITY): Payer: Self-pay

## 2021-11-24 ENCOUNTER — Telehealth: Payer: Self-pay

## 2021-11-24 NOTE — Telephone Encounter (Signed)
Would you like PA for asmanex to be submitted or change pt to what is preferred flovent, pulmicort or arnuity? Thank you

## 2021-11-24 NOTE — Telephone Encounter (Signed)
Patient called and states she cannot afford the price of the asmanex. I advised patient insurance is not wanting to cover medication without trying other alternatives (please see telephone contact on 11-24-2021).   Patient states she is on a time crunch as she has to leave on 12-08-2021 and was told to use the inhaler a week before she leave (12-01-2021). Patient is wanting to know if there is another inhaler that Dr. Neldon Mc knows will help her out with any possible reactions from exposure to the cat.   Patient's best contact number: [phone] (home)   Patient states she will be at appointments from 8:30am to 1pm on 11-25-2021 and she can be reached at her cell phone, [phone].

## 2021-11-24 NOTE — Telephone Encounter (Signed)
PA received from [facility] through [facility].   Insurance prefers Flovent, Pulmicort Inh; or Arnuity.  No notation of trial/failure/likely to cause adverse reaction or be ineffective.  PA has not been submitted at this time.  Please advise.   Key: [id number]

## 2021-11-25 DIAGNOSIS — M154 Erosive (osteo)arthritis: Secondary | ICD-10-CM | POA: Diagnosis not present

## 2021-11-25 DIAGNOSIS — M19041 Primary osteoarthritis, right hand: Secondary | ICD-10-CM | POA: Diagnosis not present

## 2021-11-25 DIAGNOSIS — Z6822 Body mass index (BMI) 22.0-22.9, adult: Secondary | ICD-10-CM | POA: Diagnosis not present

## 2021-11-25 DIAGNOSIS — Z79899 Other long term (current) drug therapy: Secondary | ICD-10-CM | POA: Diagnosis not present

## 2021-11-25 DIAGNOSIS — M19042 Primary osteoarthritis, left hand: Secondary | ICD-10-CM | POA: Diagnosis not present

## 2021-11-25 MED ORDER — PULMICORT FLEXHALER 180 MCG/ACT IN AEPB: 2.0000 | INHALATION_SPRAY | Freq: Two times a day (BID) | RESPIRATORY_TRACT | 1 refills | Status: DC

## 2021-11-25 NOTE — Telephone Encounter (Signed)
Sent in pulmicort 180 2 puffs bid in place of asmanex

## 2021-11-26 ENCOUNTER — Other Ambulatory Visit: Payer: Self-pay | Admitting: *Deleted

## 2021-11-26 MED ORDER — ARNUITY ELLIPTA 200 MCG/ACT IN AEPB
1.0000 | INHALATION_SPRAY | Freq: Every day | RESPIRATORY_TRACT | 1 refills | Status: DC
Start: 2021-11-26 — End: 2022-02-01

## 2021-11-26 NOTE — Telephone Encounter (Signed)
Most likely it is the cost. Arnuity may be a bit cheaper than the Pulmicort.

## 2021-11-26 NOTE — Telephone Encounter (Signed)
Please change to Arnuity 200 mcg 1 puff once a day and see if this is cheaper.

## 2021-11-26 NOTE — Telephone Encounter (Signed)
Is the problem with the cost of Pulmicort?

## 2021-11-26 NOTE — Telephone Encounter (Signed)
Prescription has been sent in. Called and informed patient, patient verbalized understanding.

## 2021-11-26 NOTE — Telephone Encounter (Signed)
Please advise on medication change. I tried to call the pharmacy and was transferred over 4 times.   Pulmicort is $105 Asmanex is  $206   Patient needs an inhaler before Tuesday she is going to be out of town and exposed to cats. Please advise on Flovent or Arnuitiy. It needs to be sent in today in order for it to be available by Sunday.  Dr. Neldon Mc is not in office today    Please call the patient's home phone or [phone])

## 2021-12-02 ENCOUNTER — Ambulatory Visit (INDEPENDENT_AMBULATORY_CARE_PROVIDER_SITE_OTHER): Payer: Medicare Other | Admitting: Podiatry

## 2021-12-02 DIAGNOSIS — I2510 Atherosclerotic heart disease of native coronary artery without angina pectoris: Secondary | ICD-10-CM | POA: Diagnosis not present

## 2021-12-02 DIAGNOSIS — M792 Neuralgia and neuritis, unspecified: Secondary | ICD-10-CM

## 2021-12-02 DIAGNOSIS — M722 Plantar fascial fibromatosis: Secondary | ICD-10-CM | POA: Diagnosis not present

## 2021-12-02 NOTE — Progress Notes (Signed)
Subjective: 71 year old female presents the office today for follow-up valuation left foot pain.  She states that she has been taking alpha lipoic acid and this has been helping some of the nerve symptoms.  She had pain in the hospital of the foot.  She states that she is having less pain overall to her feet.  She has tried her orthotics but she feels that also with her orthotics that she is causing arch pain the insert that she has had made.  The only shoe where she has no foot pain is a vionic sandal.   Objective: AAO x3, NAD DP/PT pulses palpable bilaterally, CRT less than 3 seconds Tenderness mostly on the arch of the foot and going into the hallux.  Not able to appreciate any area pinpoint tenderness.  No significant pain in the heel today.  Flexor, extensor tendons are intact.  MMT 5/5. No pain with calf compression, swelling, warmth, erythema  Assessment: 71 year old female with neuritis, arch pain  Plan: -All treatment options discussed with the patient including all alternatives, risks, complications.  -Reviewed the clinical cassette that seems to be helping.  We discussed stretching, icing as well as using the arch support.  When measured for new pair of orthotics deftly support the plantar fascia.. -Patient encouraged to call the office with any questions, concerns, change in symptoms.   Trula Slade DPM

## 2021-12-02 NOTE — Patient Instructions (Signed)

## 2021-12-03 DIAGNOSIS — E039 Hypothyroidism, unspecified: Secondary | ICD-10-CM | POA: Diagnosis not present

## 2021-12-03 DIAGNOSIS — R7401 Elevation of levels of liver transaminase levels: Secondary | ICD-10-CM | POA: Diagnosis not present

## 2021-12-27 DIAGNOSIS — R110 Nausea: Secondary | ICD-10-CM | POA: Diagnosis not present

## 2021-12-27 DIAGNOSIS — K317 Polyp of stomach and duodenum: Secondary | ICD-10-CM | POA: Diagnosis not present

## 2021-12-27 DIAGNOSIS — R140 Abdominal distension (gaseous): Secondary | ICD-10-CM | POA: Diagnosis not present

## 2021-12-27 DIAGNOSIS — K5900 Constipation, unspecified: Secondary | ICD-10-CM | POA: Diagnosis not present

## 2021-12-27 DIAGNOSIS — Z86010 Personal history of colonic polyps: Secondary | ICD-10-CM | POA: Diagnosis not present

## 2021-12-29 ENCOUNTER — Telehealth: Payer: Self-pay | Admitting: Podiatry

## 2021-12-29 NOTE — Telephone Encounter (Signed)
Left message on vm that orthotics are in early , pls call back if want earlier appointment.  Balance $490

## 2022-01-04 ENCOUNTER — Ambulatory Visit
Admission: RE | Admit: 2022-01-04 | Discharge: 2022-01-04 | Disposition: A | Discharge status: Home / Self Care | Payer: Medicare Other | Source: Ambulatory Visit | Attending: Hematology and Oncology | Admitting: Hematology and Oncology

## 2022-01-04 DIAGNOSIS — Z1231 Encounter for screening mammogram for malignant neoplasm of breast: Secondary | ICD-10-CM

## 2022-01-06 ENCOUNTER — Other Ambulatory Visit: Payer: Self-pay | Admitting: Hematology and Oncology

## 2022-01-06 DIAGNOSIS — R928 Other abnormal and inconclusive findings on diagnostic imaging of breast: Secondary | ICD-10-CM

## 2022-01-12 DIAGNOSIS — M542 Cervicalgia: Secondary | ICD-10-CM | POA: Diagnosis not present

## 2022-01-12 DIAGNOSIS — T148XXA Other injury of unspecified body region, initial encounter: Secondary | ICD-10-CM | POA: Diagnosis not present

## 2022-01-13 ENCOUNTER — Ambulatory Visit: Payer: Medicare Other | Admitting: *Deleted

## 2022-01-13 DIAGNOSIS — M722 Plantar fascial fibromatosis: Secondary | ICD-10-CM

## 2022-01-13 NOTE — Progress Notes (Signed)
Patient presents today to pick up custom molded foot orthotics recommended by Dr. Jacqualyn Posey.   Orthotics were dispensed and fit was satisfactory. Reviewed instructions for break-in and wear. Written instructions given to patient.  Patient will follow up as needed.   [facility] - order # [id number]

## 2022-01-18 ENCOUNTER — Encounter: Payer: Self-pay | Admitting: Pharmacist Clinician (PhC)/ Clinical Pharmacy Specialist

## 2022-01-18 DIAGNOSIS — E7800 Pure hypercholesterolemia, unspecified: Secondary | ICD-10-CM | POA: Diagnosis not present

## 2022-01-18 LAB — COMPREHENSIVE METABOLIC PANEL
ALT: 28 IU/L (ref 0–32)
AST: 23 IU/L (ref 0–40)
Albumin/Globulin Ratio: 2 (ref 1.2–2.2)
Albumin: 4.3 g/dL (ref 3.8–4.8)
Alkaline Phosphatase: 59 IU/L (ref 44–121)
BUN/Creatinine Ratio: 12 (ref 12–28)
BUN: 8 mg/dL (ref 8–27)
Bilirubin Total: 0.7 mg/dL (ref 0.0–1.2)
CO2: 26 mmol/L (ref 20–29)
Calcium: 9.8 mg/dL (ref 8.7–10.3)
Chloride: 105 mmol/L (ref 96–106)
Creatinine, Ser: 0.65 mg/dL (ref 0.57–1.00)
Globulin, Total: 2.2 g/dL (ref 1.5–4.5)
Glucose: 90 mg/dL (ref 70–99)
Potassium: 4.3 mmol/L (ref 3.5–5.2)
Sodium: 142 mmol/L (ref 134–144)
Total Protein: 6.5 g/dL (ref 6.0–8.5)
eGFR: 93 mL/min/{1.73_m2} (ref >59)

## 2022-01-18 LAB — LIPID PANEL
Chol/HDL Ratio: 2.3 ratio (ref 0.0–4.4)
Cholesterol, Total: 159 mg/dL (ref 100–199)
HDL: 69 mg/dL (ref >39)
LDL Chol Calc (NIH): 77 mg/dL (ref 0–99)
Triglycerides: 64 mg/dL (ref 0–149)
VLDL Cholesterol Cal: 13 mg/dL (ref 5–40)

## 2022-01-20 ENCOUNTER — Other Ambulatory Visit: Payer: Self-pay | Admitting: Hematology and Oncology

## 2022-01-20 ENCOUNTER — Ambulatory Visit
Admission: RE | Admit: 2022-01-20 | Discharge: 2022-01-20 | Disposition: A | Discharge status: Home / Self Care | Payer: Medicare Other | Source: Ambulatory Visit | Attending: Hematology and Oncology | Admitting: Hematology and Oncology

## 2022-01-20 DIAGNOSIS — R921 Mammographic calcification found on diagnostic imaging of breast: Secondary | ICD-10-CM

## 2022-01-20 DIAGNOSIS — R928 Other abnormal and inconclusive findings on diagnostic imaging of breast: Secondary | ICD-10-CM

## 2022-01-20 DIAGNOSIS — Z86000 Personal history of in-situ neoplasm of breast: Secondary | ICD-10-CM | POA: Diagnosis not present

## 2022-01-27 ENCOUNTER — Telehealth: Payer: Self-pay | Admitting: Allergy and Immunology

## 2022-01-27 NOTE — Telephone Encounter (Signed)
Patient has been informed and verbalized understanding.

## 2022-01-27 NOTE — Telephone Encounter (Signed)
Patient states she will be getting a needle biopsy at the breast center on Friday 01-28-2022. Patient would like to know if the biopsy procedure would affect or not affect her visit with Dr. Neldon Mc that is scheduled for next Tuesday, 02-01-2021. Patient is scheduled for allergy testing.   Please advise  Best contact number: [phone]

## 2022-01-28 ENCOUNTER — Ambulatory Visit
Admission: RE | Admit: 2022-01-28 | Discharge: 2022-01-28 | Disposition: A | Discharge status: Home / Self Care | Payer: Medicare Other | Source: Ambulatory Visit | Attending: Hematology and Oncology | Admitting: Hematology and Oncology

## 2022-01-28 DIAGNOSIS — R921 Mammographic calcification found on diagnostic imaging of breast: Secondary | ICD-10-CM

## 2022-01-28 DIAGNOSIS — N6012 Diffuse cystic mastopathy of left breast: Secondary | ICD-10-CM | POA: Diagnosis not present

## 2022-01-28 HISTORY — PX: BREAST BIOPSY: SHX20

## 2022-02-01 ENCOUNTER — Other Ambulatory Visit: Payer: Self-pay

## 2022-02-01 ENCOUNTER — Encounter: Payer: Self-pay | Admitting: Allergy and Immunology

## 2022-02-01 ENCOUNTER — Ambulatory Visit (INDEPENDENT_AMBULATORY_CARE_PROVIDER_SITE_OTHER): Payer: Medicare Other | Admitting: Allergy and Immunology

## 2022-02-01 VITALS — BP 100/68 | HR 85 | Temp 97.4°F | Resp 16 | Ht 65.0 in | Wt 126.7 lb

## 2022-02-01 DIAGNOSIS — J3089 Other allergic rhinitis: Secondary | ICD-10-CM

## 2022-02-01 DIAGNOSIS — Z91038 Other insect allergy status: Secondary | ICD-10-CM | POA: Diagnosis not present

## 2022-02-01 DIAGNOSIS — J301 Allergic rhinitis due to pollen: Secondary | ICD-10-CM

## 2022-02-01 DIAGNOSIS — J4520 Mild intermittent asthma, uncomplicated: Secondary | ICD-10-CM

## 2022-02-01 MED ORDER — ARNUITY ELLIPTA 100 MCG/ACT IN AEPB: INHALATION_SPRAY | RESPIRATORY_TRACT | 5 refills | Status: DC

## 2022-02-01 NOTE — Telephone Encounter (Signed)
Arnuity 200 mcg 1 puff once a day helped with the cats and had no symptoms cost $50 which was more affordable.

## 2022-02-01 NOTE — Progress Notes (Unsigned)
[place]   Follow-up Note  Referring Provider: Lurline Del, DO Primary Provider: Lurline Del, DO Date of Office Visit: 02/01/2022  Subjective:   Valerie Snyder (DOB: [birth year]) is a 72 y.o. female who returns to the [facility] on 02/01/2022 in re-evaluation of the following:  HPI: Makendra returns to this clinic in evaluation of allergic rhinoconjunctivitis, asthma, LPR, and history of hymenoptera venom hypersensitivity state.  Her last visit to this clinic with me was 74 February 2023.  She has had no problems with her asthma and has not required a systemic steroid to treat an exacerbation and rarely uses a short acting bronchodilator.  She can exert herself without any problem.  She did have exposure to cat recently and she used an inhaled steroid 1 week prior to that exposure and then 2 weeks during that exposure and had no problems with her asthma.  She had very little problems with her nose and has not required antibiotic treating episode of sinusitis.  She has had no issues with reflux and does not require any therapy at this point in time to address this issue.  She had a left breast biopsy performed last week and she had a very significant bleeding thereafter and has had significant bruising.  She remains away from eating all mammal.  She has never had an allergic reaction to mammal.  In the past she was skin test positive to chocolate and shellfish but she eats these foods with no problem.  She apparently contracted an episode of shingles affecting her left thorax that was treated early with Valtrex without any long-term sequela.  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  Allergies as of 02/01/2022       Reactions   Bee Venom Anaphylaxis   Cephalexin Anaphylaxis   Puffy eyes and lips Other reaction(s): facial swelling   Ketorolac Tromethamine Anaphylaxis   Puffy lips and eyes Other  reaction(s): anaphylaxis   Atorvastatin    Other reaction(s): muscle cramps   Rosuvastatin    Other reaction(s): muscle cramps Other reaction(s): Other (See Comments) Other reaction(s): muscle cramps   Simvastatin    Other reaction(s): muscle cramps Other reaction(s): muscle cramps, Other (See Comments) Other reaction(s): muscle cramps        Medication List   Arnuity Ellipta 200 MCG/ACT Aepb Generic drug: Fluticasone Furoate    cetirizine 10 MG tablet Commonly known as: ZYRTEC Take 10 mg by mouth daily as needed for allergies.   EPINEPHrine 0.3 mg/0.3 mL Soaj injection Commonly known as: EpiPen 2-Pak Inject 0.3 mg into the muscle as needed for anaphylaxis.   fluticasone 50 MCG/ACT nasal spray Commonly known as: FLONASE Place 1 spray into both nostrils as needed.   hydroxychloroquine 200 MG tablet Commonly known as: PLAQUENIL Take 200 mg by mouth in the morning and at bedtime.   levothyroxine 75 MCG tablet Commonly known as: SYNTHROID Take 75 mcg by mouth daily before breakfast.   OSTEO BI-FLEX ADV TRIPLE ST PO Take 2 tablets by mouth daily.   pantoprazole 40 MG tablet Commonly known as: PROTONIX Take 1 tablet by mouth daily at 12 noon.   Praluent 150 MG/ML Soaj Generic drug: Alirocumab Inject 150 mg into the skin every 14 (fourteen) days.   SOLUBLE FIBER/PROBIOTICS PO Take 1 capsule by mouth daily.   Ventolin HFA 108 (90 Base) MCG/ACT inhaler Generic drug: albuterol Inhale 2 puffs into the lungs every 4 (four) hours as  needed for wheezing or shortness of breath.    Past Medical History:  Diagnosis Date   Abnormal uterine bleeding (AUB)    Allergy    Asthma    allergy related   Breast cancer of 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 breast ([place]) 11/13/2014   CAD in native artery 01/15/2019   Asymptomatic coronary calcification on coronary calcium score.  72nd percentile 11/2018.   Complication of anesthesia    10 years ago- pt states she stopped  breathing and "had to be bagged"   Concussion 2018   GERD (gastroesophageal reflux disease)    H/O bilateral inguinal hernia repair 1992   Hypercholesteremia    Hypothyroidism    Migraines    Personal history of radiation therapy    Plantar fasciitis, bilateral    Pure hypercholesterolemia 10/10/2018   Radiation 01/12/15-02/04/15   right breast 42.17 gray   Spinal stenosis    Thyroid disease     Past Surgical History:  Procedure Laterality Date   BLADDER SUSPENSION     BLEPHAROPLASTY Bilateral    BREAST BIOPSY Left 01/28/2022   MM LT BREAST BX W LOC DEV 1ST LESION IMAGE BX SPEC STEREO GUIDE 01/28/2022 [facility]   BREAST LUMPECTOMY Right 12/10/2014   BREAST LUMPECTOMY WITH RADIOACTIVE SEED LOCALIZATION Right 12/10/2014   Procedure: RIGHT BREAST LUMPECTOMY WITH RADIOACTIVE SEED LOCALIZATION;  Surgeon: Autumn Messing III, MD;  Location: [facility];  Service: General;  Laterality: Right;   CHOLECYSTECTOMY N/A 02/20/2021   Procedure: LAPAROSCOPIC CHOLECYSTECTOMY;  Surgeon: Ralene Ok, MD;  Location: [facility];  Service: General;  Laterality: N/A;   Loop N/A 07/15/2020   Procedure: DILATATION & CURETTAGE/HYSTEROSCOPY Everlene Balls USING MYOSURE;  Surgeon: Christophe Louis, MD;  Location: [facility];  Service: Gynecology;  Laterality: N/A;   FOOT SURGERY     plantar fashiectomies from both feet   HERNIA REPAIR     HYSTEROSCOPY WITH D & C N/A 12/27/2017   Procedure: DILATATION AND CURETTAGE /HYSTEROSCOPY;  Surgeon: Janyth Pupa, DO;  Location: [facility];  Service: Gynecology;  Laterality: N/A;   NASAL SEPTUM SURGERY     neuromas removed from right foot     osteoma  1991   removal from right orbital   POLYPECTOMY  12/27/2017   Procedure: POLYPECTOMY;  Surgeon: Janyth Pupa, DO;  Location: [facility];  Service: Gynecology;;   removal of basal cell carcinoma (nose)      TONSILECTOMY/ADENOIDECTOMY WITH MYRINGOTOMY     TONSILLECTOMY      Review of systems negative except as noted in HPI / PMHx or noted below:  Review of Systems  Constitutional: Negative.   HENT: Negative.    Eyes: Negative.   Respiratory: Negative.    Cardiovascular: Negative.   Gastrointestinal: Negative.   Genitourinary: Negative.   Musculoskeletal: Negative.   Skin: Negative.   Neurological: Negative.   Endo/Heme/Allergies: Negative.   Psychiatric/Behavioral: Negative.       Objective:   Vitals:   02/01/22 1353  BP: 100/68  Pulse: 85  Resp: 16  Temp: (!) 97.4 F (36.3 C)  SpO2: 97%   Height: '5\' 5"'$  (165.1 cm)  Weight: 126 lb 11.2 oz (57.5 kg)   Physical Exam Constitutional:      Appearance: She is not diaphoretic.  HENT:     Head: Normocephalic.     Right Ear: Tympanic membrane, ear canal and external ear normal.     Left Ear: Tympanic membrane, ear canal  and external ear normal.     Nose: Nose normal. No mucosal edema or rhinorrhea.     Mouth/Throat:     Pharynx: Uvula midline. No oropharyngeal exudate.  Eyes:     Conjunctiva/sclera: Conjunctivae normal.  Neck:     Thyroid: No thyromegaly.     Trachea: Trachea normal. No tracheal tenderness or tracheal deviation.  Cardiovascular:     Rate and Rhythm: Normal rate and regular rhythm.     Heart sounds: Normal heart sounds, S1 normal and S2 normal. No murmur heard. Pulmonary:     Effort: No respiratory distress.     Breath sounds: Normal breath sounds. No stridor. No wheezing or rales.  Lymphadenopathy:     Head:     Right side of head: No tonsillar adenopathy.     Left side of head: No tonsillar adenopathy.     Cervical: No cervical adenopathy.  Skin:    Findings: Rash (Large bruising/ecchymotic region left breast) present. No erythema.     Nails: There is no clubbing.  Neurological:     Mental Status: She is alert.      Diagnostics: Allergy skin tests were performed.  She demonstrated  hypersensitivity against dust mite, pollens, cat, dog,.  She also had slight hypersensitivity against soybean which she can eat without problem.  She did not have any sensitivity directed against mammal meat.  The patient had an Asthma Control Test with the following results: ACT Total Score: 25.    Assessment and Plan:   1. Perennial allergic rhinitis   2. Seasonal allergic rhinitis due to pollen   3. Asthma, mild intermittent, well-controlled   4. Hymenoptera allergy     1. Avoidance measures - dust mite, pollens, cat, dog  2. Continue the following if needed:   A. Zyrtec 10 mg one tablet once a day  B. Mucinex  C. Nasal saline  D. Albuterol HFA 2 puffs every 4-6 hours  E. EpiPen  3. Can use Arnuity 200 - 1 inhalation daily starting 1 week prior to and during cat exposure.  4. Return to clinic in 1 year or earlier if problem  Liberti appears to be doing pretty well and has a good understanding about her disease state and as long as she can perform allergen avoidance measures I think the plan noted above is going to work pretty well.  She will keep in contact with me noting her response to this plan as we move forward and I will see her back in this clinic in 1 year or earlier if there is a problem.  Allena Katz, MD Allergy / Immunology [place]

## 2022-02-01 NOTE — Patient Instructions (Addendum)
  1. Avoidance measures - dust mite, pollens, cat, dog  2. Continue the following if needed:   A. Zyrtec 10 mg one tablet once a day  B. Mucinex  C. Nasal saline  D. Ventolin HFA 2 puffs every 4-6 hours  E. EpiPen  3. Can use Arnuity 200 - 1 inhalation daily starting 1 week prior to and during cat exposure.  4. Return to clinic in 1 year or earlier if problem

## 2022-02-02 ENCOUNTER — Encounter: Payer: Self-pay | Admitting: Pharmacist Clinician (PhC)/ Clinical Pharmacy Specialist

## 2022-02-02 ENCOUNTER — Encounter: Payer: Self-pay | Admitting: Allergy and Immunology

## 2022-02-02 ENCOUNTER — Telehealth: Payer: Self-pay | Admitting: Pharmacist Clinician (PhC)/ Clinical Pharmacy Specialist

## 2022-02-02 NOTE — Telephone Encounter (Signed)
New insurance for 2024.  Praluent preferred.  Was previously with [employer] and on Praluent.   PA Key:  [id number]  Approved until further notice

## 2022-02-07 ENCOUNTER — Encounter (HOSPITAL_BASED_OUTPATIENT_CLINIC_OR_DEPARTMENT_OTHER): Payer: Self-pay | Admitting: Pharmacist Clinician (PhC)/ Clinical Pharmacy Specialist

## 2022-02-08 ENCOUNTER — Other Ambulatory Visit: Payer: Self-pay

## 2022-02-08 ENCOUNTER — Emergency Department (HOSPITAL_BASED_OUTPATIENT_CLINIC_OR_DEPARTMENT_OTHER)
Admission: EM | Admit: 2022-02-08 | Discharge: 2022-02-08 | Disposition: A | Discharge status: Home / Self Care | Payer: Medicare Other | Attending: Emergency Medicine | Admitting: Emergency Medicine

## 2022-02-08 ENCOUNTER — Encounter (HOSPITAL_BASED_OUTPATIENT_CLINIC_OR_DEPARTMENT_OTHER): Payer: Self-pay | Admitting: Pharmacist Clinician (PhC)/ Clinical Pharmacy Specialist

## 2022-02-08 ENCOUNTER — Emergency Department (HOSPITAL_BASED_OUTPATIENT_CLINIC_OR_DEPARTMENT_OTHER): Payer: Medicare Other

## 2022-02-08 ENCOUNTER — Encounter (HOSPITAL_BASED_OUTPATIENT_CLINIC_OR_DEPARTMENT_OTHER): Payer: Self-pay | Admitting: Urology

## 2022-02-08 DIAGNOSIS — R0602 Shortness of breath: Secondary | ICD-10-CM | POA: Diagnosis not present

## 2022-02-08 DIAGNOSIS — E039 Hypothyroidism, unspecified: Secondary | ICD-10-CM | POA: Diagnosis not present

## 2022-02-08 DIAGNOSIS — Z8679 Personal history of other diseases of the circulatory system: Secondary | ICD-10-CM | POA: Diagnosis not present

## 2022-02-08 DIAGNOSIS — Z853 Personal history of malignant neoplasm of breast: Secondary | ICD-10-CM | POA: Diagnosis not present

## 2022-02-08 DIAGNOSIS — J45909 Unspecified asthma, uncomplicated: Secondary | ICD-10-CM | POA: Insufficient documentation

## 2022-02-08 DIAGNOSIS — R0789 Other chest pain: Secondary | ICD-10-CM | POA: Diagnosis not present

## 2022-02-08 DIAGNOSIS — M546 Pain in thoracic spine: Secondary | ICD-10-CM | POA: Diagnosis not present

## 2022-02-08 DIAGNOSIS — M549 Dorsalgia, unspecified: Secondary | ICD-10-CM | POA: Diagnosis not present

## 2022-02-08 DIAGNOSIS — R079 Chest pain, unspecified: Secondary | ICD-10-CM | POA: Diagnosis not present

## 2022-02-08 LAB — BASIC METABOLIC PANEL
Anion gap: 8 (ref 5–15)
BUN: 15 mg/dL (ref 8–23)
CO2: 26 mmol/L (ref 22–32)
Calcium: 8.6 mg/dL — ABNORMAL LOW (ref 8.9–10.3)
Chloride: 101 mmol/L (ref 98–111)
Creatinine, Ser: 0.74 mg/dL (ref 0.44–1.00)
GFR, Estimated: >60 mL/min (ref >60)
Glucose, Bld: 116 mg/dL — ABNORMAL HIGH (ref 70–99)
Potassium: 3.7 mmol/L (ref 3.5–5.1)
Sodium: 135 mmol/L (ref 135–145)

## 2022-02-08 LAB — CBC
HCT: 37.3 % (ref 36.0–46.0)
Hemoglobin: 12.4 g/dL (ref 12.0–15.0)
MCH: 31.5 pg (ref 26.0–34.0)
MCHC: 33.2 g/dL (ref 30.0–36.0)
MCV: 94.7 fL (ref 80.0–100.0)
Platelets: 195 K/uL (ref 150–400)
RBC: 3.94 MIL/uL (ref 3.87–5.11)
RDW: 11.9 % (ref 11.5–15.5)
WBC: 4.8 K/uL (ref 4.0–10.5)
nRBC: 0 % (ref 0.0–0.2)

## 2022-02-08 LAB — TROPONIN I (HIGH SENSITIVITY)
Troponin I (High Sensitivity): 2 ng/L (ref <18)
Troponin I (High Sensitivity): 2 ng/L (ref <18)

## 2022-02-08 MED ORDER — IOHEXOL 350 MG/ML SOLN
100.0000 mL | Freq: Once | INTRAVENOUS | Status: AC | PRN
  Administered 2022-02-08: 100 mL via INTRAVENOUS

## 2022-02-08 MED ORDER — IOHEXOL 350 MG/ML SOLN: 75.0000 mL | Freq: Once | INTRAVENOUS | Status: DC | PRN

## 2022-02-08 MED ORDER — TRAMADOL HCL 50 MG PO TABS
50.0000 mg | ORAL_TABLET | Freq: Once | ORAL | Status: AC
  Administered 2022-02-08: 50 mg via ORAL
  Filled 2022-02-08: qty 1

## 2022-02-08 MED ORDER — FENTANYL CITRATE PF 50 MCG/ML IJ SOSY
50.0000 ug | PREFILLED_SYRINGE | Freq: Once | INTRAMUSCULAR | Status: AC
  Administered 2022-02-08: 50 ug via INTRAVENOUS
  Filled 2022-02-08: qty 1

## 2022-02-08 MED ORDER — TRAMADOL HCL 50 MG PO TABS: 50.0000 mg | ORAL_TABLET | Freq: Four times a day (QID) | ORAL | 0 refills | Status: DC | PRN

## 2022-02-08 NOTE — ED Triage Notes (Signed)
Central chest pain radiating to back between shoulder blades that started almost 2 weeks ago  Extensive bruising noted under left breast from biopsy  Mild SOB as well with lying down  Had breast biopsy 12/2, 1/4 had allergy testing

## 2022-02-08 NOTE — ED Provider Notes (Signed)
[facility] Provider Note: Georgena Spurling, MD, FACEP  CSN: [id number] MRN: [id number] ARRIVAL: 02/08/22 at [place]: [facility]  Chest Pain   HISTORY OF PRESENT ILLNESS  02/08/22 2:47 AM Tifani Denardo is a 72 y.o. female who has been having upper midline back pain intermittently for about the past 2 weeks.  She sometimes wakes up with this pain.  The pain is not worse with movement or palpation.  The pain was especially severe yesterday.  She had a biopsy of the left breast on 01/28/2022 and was pressed up against a hard surface for the procedure.  Since that time she has had pain and tenderness in her sternum.  She has continued ecchymosis of her left breast around the site of the biopsy.  She does have some mild shortness of breath when lying flat.  She currently rates her pain as a 4 out of 10.   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  72nd percentile 11/2018.   Complication of anesthesia    10 years ago- pt states she stopped breathing and "had to be bagged"   Concussion 2018   GERD (gastroesophageal reflux disease)    H/O bilateral inguinal hernia repair 1992   Hypercholesteremia    Hypothyroidism    Migraines    Personal history of radiation therapy    Plantar fasciitis, bilateral    Pure hypercholesterolemia 10/10/2018   Radiation 01/12/15-02/04/15   right breast 42.17 gray   Spinal stenosis    Thyroid disease     Past Surgical History:  Procedure Laterality Date   BLADDER SUSPENSION     BLEPHAROPLASTY Bilateral    BREAST BIOPSY Left 01/28/2022   MM LT BREAST BX W LOC DEV 1ST LESION IMAGE BX SPEC STEREO GUIDE 01/28/2022 [facility]   BREAST LUMPECTOMY Right 12/10/2014    BREAST LUMPECTOMY WITH RADIOACTIVE SEED LOCALIZATION Right 12/10/2014   Procedure: RIGHT BREAST LUMPECTOMY WITH RADIOACTIVE SEED LOCALIZATION;  Surgeon: Autumn Messing III, MD;  Location: [facility];  Service: General;  Laterality: Right;   CHOLECYSTECTOMY N/A 02/20/2021   Procedure: LAPAROSCOPIC CHOLECYSTECTOMY;  Surgeon: Ralene Ok, MD;  Location: [facility];  Service: General;  Laterality: N/A;   Mutual N/A 07/15/2020   Procedure: DILATATION & CURETTAGE/HYSTEROSCOPY Everlene Balls USING MYOSURE;  Surgeon: Christophe Louis, MD;  Location: [facility];  Service: Gynecology;  Laterality: N/A;   FOOT SURGERY     plantar fashiectomies from both feet   HERNIA REPAIR     HYSTEROSCOPY WITH D & C N/A 12/27/2017   Procedure: DILATATION AND CURETTAGE /HYSTEROSCOPY;  Surgeon: Janyth Pupa, DO;  Location: [facility];  Service: Gynecology;  Laterality: N/A;   NASAL SEPTUM SURGERY     neuromas removed from right foot     osteoma  1991   removal from right orbital   POLYPECTOMY  12/27/2017   Procedure: POLYPECTOMY;  Surgeon: Janyth Pupa, DO;  Location: [facility];  Service: Gynecology;;   removal of basal cell carcinoma (nose)     TONSILECTOMY/ADENOIDECTOMY WITH MYRINGOTOMY     TONSILLECTOMY      Family History  Problem Relation Age of Onset   Pancreatic  cancer Maternal Aunt        dx. 40s-early 84s   Congestive Heart Failure Mother 68   Lung disease Mother    Colon polyps Mother        unspecified number   Dementia Father        Lewy Body Dementia   Kidney failure Father    Stroke Father    Depression Father    Multiple sclerosis Sister    Depression Sister    Aneurysm Maternal Grandmother        brain   Heart attack Maternal Grandfather    Emphysema Paternal Grandfather    Heart Problems Paternal Grandfather    Other Sister        3 sisters have history of cysts in uterus   Depression Sister     Breast cancer Cousin        dx. 27-50   Testicular cancer Cousin        dx. 55s   Colon polyps Daughter        unspecified number    Social History   Tobacco Use   Smoking status: Never   Smokeless tobacco: Never  Vaping Use   Vaping Use: Never used  Substance Use Topics   Alcohol use: No   Drug use: No    Prior to Admission medications   Medication Sig Start Date End Date Taking? Authorizing Provider  traMADol (ULTRAM) 50 MG tablet Take 1 tablet (50 mg total) by mouth every 6 (six) hours as needed for severe pain or moderate pain. 02/08/22  Yes Tywaun Hiltner, MD  Alirocumab (PRALUENT) 150 MG/ML SOAJ Inject 150 mg into the skin every 14 (fourteen) days. 04/13/21   Skeet Latch, MD  cetirizine (ZYRTEC) 10 MG tablet Take 10 mg by mouth daily as needed for allergies.     [provider]  EPINEPHrine (EPIPEN 2-PAK) 0.3 mg/0.3 mL IJ SOAJ injection Inject 0.3 mg into the muscle as needed for anaphylaxis. 03/30/21   Kozlow, Donnamarie Poag, MD  fluticasone (FLONASE) 50 MCG/ACT nasal spray Place 1 spray into both nostrils as needed.    [provider]  Fluticasone Furoate (ARNUITY ELLIPTA) 100 MCG/ACT AEPB 1 inhalation daily starting 1 week prior to and during cat exposure. 02/01/22   Kozlow, Donnamarie Poag, MD  hydroxychloroquine (PLAQUENIL) 200 MG tablet Take 200 mg by mouth in the morning and at bedtime. 11/27/19   [provider]  levothyroxine (SYNTHROID) 75 MCG tablet Take 75 mcg by mouth daily before breakfast. 12/20/19   [provider]  pantoprazole (PROTONIX) 40 MG tablet Take 1 tablet by mouth daily at 12 noon. 06/17/21   [provider]  Probiotic Product (SOLUBLE FIBER/PROBIOTICS PO) Take 1 capsule by mouth daily.    [provider]  VENTOLIN HFA 108 (90 Base) MCG/ACT inhaler Inhale 2 puffs into the lungs every 4 (four) hours as needed for wheezing or shortness of breath. 03/30/21   Kozlow, Donnamarie Poag, MD    Allergies Bee venom, Cephalexin,  Ketorolac tromethamine, Atorvastatin, Rosuvastatin, and Simvastatin   REVIEW OF SYSTEMS  Negative except as noted here or in the History of Present Illness.   PHYSICAL EXAMINATION  Initial Vital Signs Blood pressure 136/62, pulse 76, temperature 97.8 F (36.6 C), resp. rate 20, height '5\' 5"'$  (1.651 m), weight 54.4 kg, SpO2 100 %.  Examination General: Well-developed, well-nourished female in no acute distress; appearance consistent with age of record HENT: normocephalic; atraumatic Eyes: Normal appearance Neck: supple; nontender Heart: regular rate  and rhythm Lungs: clear to auscultation bilaterally Chest: Upper sternal tenderness; dependent ecchymosis of left breast Abdomen: soft; nondistended; nontender; bowel sounds present Back: No spinal tenderness Extremities: No deformity; full range of motion; pulses normal Neurologic: Awake, alert and oriented; motor function intact in all extremities and symmetric; no facial droop Skin: Warm and dry Psychiatric: Normal mood and affect   RESULTS  Summary of this visit's results, reviewed and interpreted by myself:   EKG Interpretation  Date/Time:  Tuesday February 08 2022 00:22:46 EST Ventricular Rate:  75 PR Interval:  155 QRS Duration: 89 QT Interval:  403 QTC Calculation: 451 R Axis:   76 Text Interpretation: Sinus rhythm Normal ECG No significant change was found Confirmed by Kolette Vey, Jenny Reichmann [phone]) on 02/08/2022 12:43:22 AM       Laboratory Studies: Results for orders placed or performed during the hospital encounter of 02/08/22 (from the past 24 hour(s))  Basic metabolic panel     Status: Abnormal   Collection Time: 02/08/22 12:24 AM  Result Value Ref Range   Sodium 135 135 - 145 mmol/L   Potassium 3.7 3.5 - 5.1 mmol/L   Chloride 101 98 - 111 mmol/L   CO2 26 22 - 32 mmol/L   Glucose, Bld 116 (H) 70 - 99 mg/dL   BUN 15 8 - 23 mg/dL   Creatinine, Ser 0.74 0.44 - 1.00 mg/dL   Calcium 8.6 (L) 8.9 - 10.3 mg/dL   GFR,  Estimated >60 >60 mL/min   Anion gap 8 5 - 15  CBC     Status: None   Collection Time: 02/08/22 12:24 AM  Result Value Ref Range   WBC 4.8 4.0 - 10.5 K/uL   RBC 3.94 3.87 - 5.11 MIL/uL   Hemoglobin 12.4 12.0 - 15.0 g/dL   HCT 37.3 36.0 - 46.0 %   MCV 94.7 80.0 - 100.0 fL   MCH 31.5 26.0 - 34.0 pg   MCHC 33.2 30.0 - 36.0 g/dL   RDW 11.9 11.5 - 15.5 %   Platelets 195 150 - 400 K/uL   nRBC 0.0 0.0 - 0.2 %  Troponin I (High Sensitivity)     Status: None   Collection Time: 02/08/22 12:24 AM  Result Value Ref Range   Troponin I (High Sensitivity) 2 <18 ng/L  Troponin I (High Sensitivity)     Status: None   Collection Time: 02/08/22  3:15 AM  Result Value Ref Range   Troponin I (High Sensitivity) 2 <18 ng/L   Imaging Studies: CT Angio Chest Aorta W and/or Wo Contrast  Result Date: 02/08/2022 CLINICAL DATA:  Centralized chest pain radiating to the back between shoulder blades with onset 2 weeks ago and mild shortness of breath with lying down. Recent breast biopsy. History of asthma. EXAM: CT ANGIOGRAPHY CHEST WITH CONTRAST TECHNIQUE: Multidetector CT imaging of the chest was performed using the standard protocol during bolus administration of intravenous contrast. Multiplanar CT image reconstructions and MIPs were obtained to evaluate the vascular anatomy. RADIATION DOSE REDUCTION: This exam was performed according to the departmental dose-optimization program which includes automated exposure control, adjustment of the mA and/or kV according to patient size and/or use of iterative reconstruction technique. CONTRAST:  134m OMNIPAQUE IOHEXOL 350 MG/ML SOLN COMPARISON:  Chest radiographs earlier today and CT chest 12/03/2020 FINDINGS: Cardiovascular: Preferential opacification of the thoracic aorta. No evidence of thoracic aortic aneurysm or dissection. Normal heart size. No pericardial effusion. Satisfactory opacification of the pulmonary arteries which are negative for pulmonary embolism.  Mediastinum/Nodes: Unremarkable esophagus. No mediastinal adenopathy. Slightly increased size of a few left axillary and left subpectoral lymph nodes. For example a subpectoral node now measures 9 mm in short axis (5/24, previously 6 mm. Lungs/Pleura: Scattered scarring/atelectasis. No focal consolidation, pleural effusion, or pneumothorax. Airways are patent. 5 mm pulmonary nodule in the lingula (6/23) is unchanged since 01/22/2020 and compatible with benign etiology. No follow-up recommended. Upper Abdomen: No acute abnormality. Musculoskeletal: Asymmetric soft tissue thickening in the left breast. No acute or significant osseous findings. Review of the MIP images confirms the above findings. IMPRESSION: No acute abnormality in the chest. Specifically no acute aortic syndrome. Asymmetric soft tissue thickening in the left breast. Slightly increased size of prominent left axillary and subpectoral lymph nodes. Given history of breast cancer on the left, follow-up with breast clinic is recommended if not performed previously. Electronically Signed   By: Placido Sou M.D.   On: 02/08/2022 04:06   DG Chest 2 View  Result Date: 02/08/2022 CLINICAL DATA:  Chest pain EXAM: CHEST - 2 VIEW COMPARISON:  01/12/2022 FINDINGS: The heart size and mediastinal contours are within normal limits. Both lungs are clear. The visualized skeletal structures are unremarkable. IMPRESSION: No active cardiopulmonary disease. Electronically Signed   By: Placido Sou M.D.   On: 02/08/2022 00:55    ED COURSE and MDM  Nursing notes, initial and subsequent vitals signs, including pulse oximetry, reviewed and interpreted by myself.  Vitals:   02/08/22 0019 02/08/22 0020 02/08/22 0322  BP: 136/62  137/69  Pulse: 76  75  Resp: 20  20  Temp: 97.8 F (36.6 C)  97.9 F (36.6 C)  TempSrc:   Oral  SpO2: 100%  99%  Weight:  54.4 kg   Height:  '5\' 5"'$  (1.651 m)    Medications  traMADol (ULTRAM) tablet 50 mg (has no administration  in time range)  fentaNYL (SUBLIMAZE) injection 50 mcg (50 mcg Intravenous Given 02/08/22 0308)  iohexol (OMNIPAQUE) 350 MG/ML injection 100 mL (100 mLs Intravenous Contrast Given 02/08/22 0349)   3:01 AM The patient's EKG is normal and her first troponin is 2.  Her chest pain is reproducible and does not sound cardiac in nature.  Her intrascapular back pain appears to be unrelated and we will obtain a CT of her aorta to evaluate for dissection.  4:23 AM No evidence of aortic dissection or other significant pathology of the chest.  I suspect the edema and lymphadenopathy seen on the left is associated with the patient's recent biopsy.  The chest pain is consistent with chest wall pain associated with her recent procedure as it is reproducible.  The cause of the back pain is unclear.  The patient would like something for the pain and she is amenable to trying tramadol.  PROCEDURES  Procedures   ED DIAGNOSES     ICD-10-CM   1. Acute upper back pain  M54.9     2. Sternal pain  R07.89          Shanon Rosser, MD 02/08/22 [phone]

## 2022-02-11 DIAGNOSIS — Z6820 Body mass index (BMI) 20.0-20.9, adult: Secondary | ICD-10-CM | POA: Diagnosis not present

## 2022-02-11 DIAGNOSIS — Z09 Encounter for follow-up examination after completed treatment for conditions other than malignant neoplasm: Secondary | ICD-10-CM | POA: Diagnosis not present

## 2022-02-11 DIAGNOSIS — M542 Cervicalgia: Secondary | ICD-10-CM | POA: Diagnosis not present

## 2022-02-12 DIAGNOSIS — R6883 Chills (without fever): Secondary | ICD-10-CM | POA: Diagnosis not present

## 2022-02-12 DIAGNOSIS — R051 Acute cough: Secondary | ICD-10-CM | POA: Diagnosis not present

## 2022-02-12 DIAGNOSIS — B349 Viral infection, unspecified: Secondary | ICD-10-CM | POA: Diagnosis not present

## 2022-02-12 DIAGNOSIS — Z03818 Encounter for observation for suspected exposure to other biological agents ruled out: Secondary | ICD-10-CM | POA: Diagnosis not present

## 2022-02-12 DIAGNOSIS — R0982 Postnasal drip: Secondary | ICD-10-CM | POA: Diagnosis not present

## 2022-02-22 ENCOUNTER — Ambulatory Visit (INDEPENDENT_AMBULATORY_CARE_PROVIDER_SITE_OTHER): Payer: Medicare Other | Admitting: Internal Medicine

## 2022-02-22 ENCOUNTER — Encounter: Payer: Self-pay | Admitting: Internal Medicine

## 2022-02-22 ENCOUNTER — Other Ambulatory Visit: Payer: Self-pay

## 2022-02-22 VITALS — BP 106/54 | HR 81 | Temp 98.0°F | Resp 14 | Wt 126.2 lb

## 2022-02-22 DIAGNOSIS — J301 Allergic rhinitis due to pollen: Secondary | ICD-10-CM

## 2022-02-22 DIAGNOSIS — J4520 Mild intermittent asthma, uncomplicated: Secondary | ICD-10-CM

## 2022-02-22 DIAGNOSIS — J3089 Other allergic rhinitis: Secondary | ICD-10-CM

## 2022-02-22 DIAGNOSIS — J069 Acute upper respiratory infection, unspecified: Secondary | ICD-10-CM | POA: Diagnosis not present

## 2022-02-22 MED ORDER — CETIRIZINE HCL 10 MG PO TABS: 10.0000 mg | ORAL_TABLET | Freq: Every day | ORAL | 5 refills | Status: DC | PRN

## 2022-02-22 MED ORDER — ARNUITY ELLIPTA 100 MCG/ACT IN AEPB: INHALATION_SPRAY | RESPIRATORY_TRACT | 5 refills | Status: DC

## 2022-02-22 MED ORDER — FLUTICASONE PROPIONATE 50 MCG/ACT NA SUSP: 2.0000 | NASAL | 5 refills | Status: DC | PRN

## 2022-02-22 MED ORDER — VENTOLIN HFA 108 (90 BASE) MCG/ACT IN AERS: 2.0000 | INHALATION_SPRAY | RESPIRATORY_TRACT | 1 refills | Status: DC | PRN

## 2022-02-22 NOTE — Patient Instructions (Addendum)
Viral URI with cough  Mild Intermittent Asthma Allergic Rhinitis - With respiratory illness or flare up such as now, start Arnuity 139mg 1 puff daily for 1-2 weeks and then stop.  - Rescue inhaler: Albuterol 2 puffs via spacer every 4-6 hours as needed for respiratory symptoms of cough, shortness of breath, or wheezing. - Use nasal saline rinses before nose sprays such as with Neilmed Sinus Rinse.  Use distilled water.   - Use Flonase 2 sprays each nostril daily. Aim upward and outward. - Use Zyrtec 10 mg daily.  - Use Mucinex as needed from over the counter.   - If you develop high fevers, facial pain or purulent drainage, let u[place]ore[place]now as we will need to treat this with an antibiotic.   - REST REST REST!

## 2022-02-22 NOTE — Progress Notes (Signed)
FOLLOW UP Date of Service/Encounter:  02/22/22   Subjective:  Valerie Snyder (DOB: 01-Nov-1950) is a 72 y.o. female who [facility] to the [facility] on 02/22/2022 for follow up for an acute visit.   History obtained from: chart review and patient. Last seen by Dr. Neldon Mc 02/01/2022 for allergic rhinitis, mild intermittent asthma and hymenoptera allergy.  On PRN Arnuity/Albuterol/Flonase/Zyrtec.   About 10 days ago, she went to the [facility] as she was not doing well with upper respiratory infection symptoms like sore throat, drainage, congestion. Flu/COVID/RSV was negative and was told it was a viral infection and to rest and push plenty of fluids.  However, she still has not recovered and feels tired/fatigued, has sore throat, post nasal drainage, congestion, cough. No fevers, facial pain, purulent drainage, trouble breathing, wheezing.  She has tried Robitussin and Albuterol without relief.  Uses saline spray.  Has not tried Arnuity or Flonase.  She has not been resting much either because she has to cook, clean and do laundry around the house and is always active.    Past Medical History: Past Medical History:  Diagnosis Date   Abnormal uterine bleeding (AUB)    Allergy    Asthma    allergy related   Breast cancer of 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 breast ([facility]) 11/13/2014   CAD in native artery 01/15/2019   Asymptomatic coronary calcification on coronary calcium score.  72nd percentile 11/2018.   Complication of anesthesia    10 years ago- pt states she stopped breathing and "had to be bagged"   Concussion 2018   GERD (gastroesophageal reflux disease)    H/O bilateral inguinal hernia repair 1992   Hypercholesteremia    Hypothyroidism    Migraines    Personal history of radiation therapy    Plantar fasciitis, bilateral    Pure hypercholesterolemia 10/10/2018   Radiation 01/12/15-02/04/15   right breast 42.17 gray   Spinal stenosis    Thyroid  disease     Objective:  BP (!) 106/54   Pulse 81   Temp 98 F (36.7 C) (Temporal)   Resp 14   Wt 126 lb 3.2 oz (57.2 kg)   SpO2 98%   BMI 21.00 kg/m  Body mass index is 21 kg/m. Physical Exam: GEN: alert, well developed HEENT: clear conjunctiva, TM grey and translucent, nose with moderate inferior turbinate hypertrophy, pink nasal mucosa, clear rhinorrhea, + cobblestoning HEART: regular rate and rhythm, no murmur LUNGS: clear to auscultation bilaterally, no coughing, unlabored respiration SKIN: no rashes or lesions  Assessment/Plan  Viral URI with cough  Mild Intermittent Asthma Allergic Rhinitis - Discussed this is likely a viral illness and can result in prolonged cough afterwards.  If you develop high fevers, facial pain or purulent drainage, let us know as we will need to treat this with an antibiotic.  Low suspicion for asthma exacerbation- no wheezing/trouble breathing and no wheezing on exam today.  - With respiratory illness or flare up such as now, start Arnuity 158mg 1 puff daily for 1-2 weeks and then stop.  - Rescue inhaler: Albuterol 2 puffs via spacer every 4-6 hours as needed for respiratory symptoms of cough, shortness of breath, or wheezing. - Use nasal saline rinses before nose sprays such as with Neilmed Sinus Rinse.  Use distilled water.   - Use Flonase 2 sprays each nostril daily. Aim upward and outward. - Use Zyrtec 10 mg daily.  - Use Mucinex as needed from over the counter.  -  Symptomatic care discussed such as honey/lemon/ginger, warm tea, humidification.      No follow-ups on file. Harlon Flor, MD  [facility]

## 2022-03-04 IMAGING — MR MR BREAST BILAT WO/W CM
7 of 12 series · 30 of 48 positions shown · IV contrast (6ml Gadavist)
Comparison: Previous exam(s).

CLINICAL DATA: 69-year-old female with history of right breast DCIS
in 6324 status post lumpectomy and radiation. Patient is having
right-sided breast pain with yellow discharge.

LABS:  None.
EXAM:
BILATERAL BREAST MRI WITH AND WITHOUT CONTRAST
TECHNIQUE: Multiplanar, multisequence MR images of both breasts were obtained
prior to and following the intravenous administration of 6 ml of
Gadavist

[Series 4: fl3d pre-cm no · axial · non-contrast · 0.9mm · 0.86mm/px · z∈[-48,+109]mm · 5 of 176 slices shown]
[im 1/176]
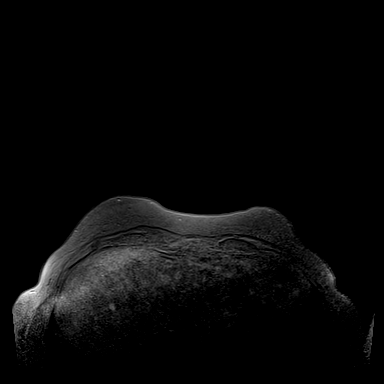
[im 44/176]
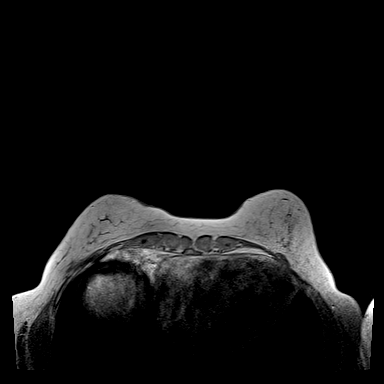
[im 88/176]
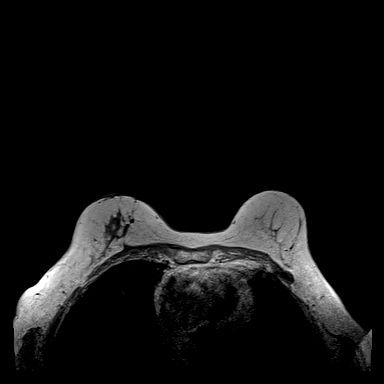
[im 132/176]
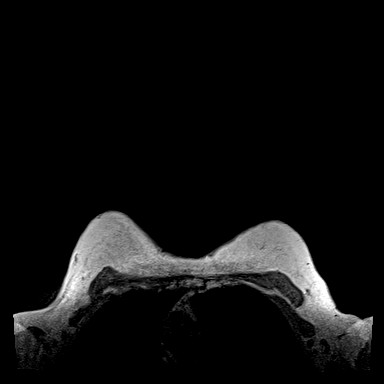
[im 176/176]
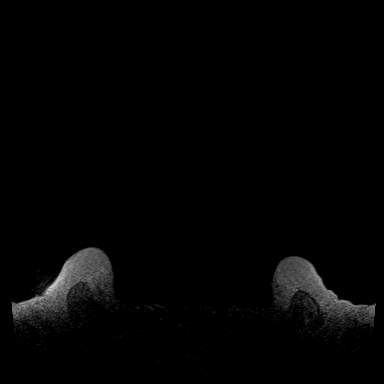

[Series 5: fl3d pre-cm · axial · non-contrast · 0.9mm · 0.79mm/px · z∈[-48,+109]mm · 5 of 176 slices shown]
[im 1/176]
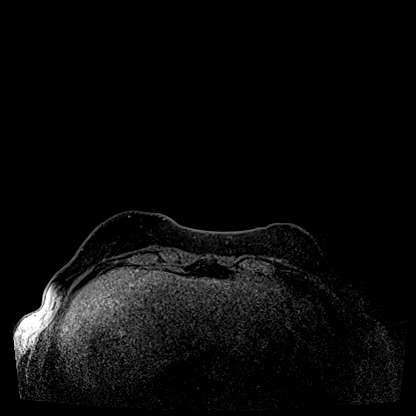
[im 44/176]
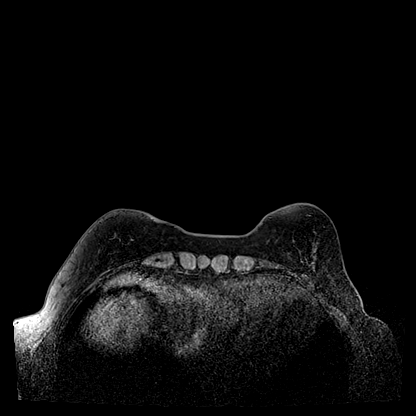
[im 88/176]
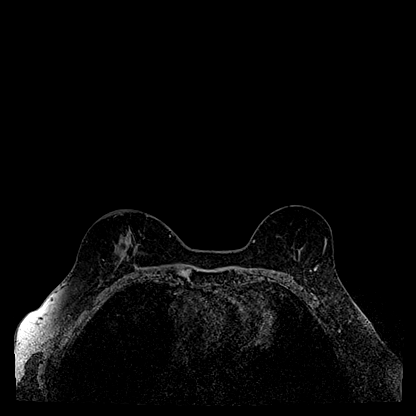
[im 132/176]
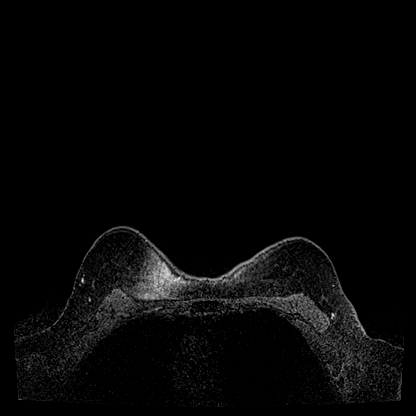
[im 176/176]
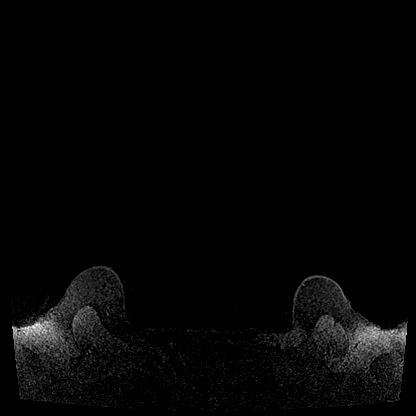

[Series 6: fl3d post-cm 20 · axial · 0.9mm · 0.79mm/px · z∈[-48,+109]mm · 5 of 176 slices shown (1 of 3)]
[im 1/176]
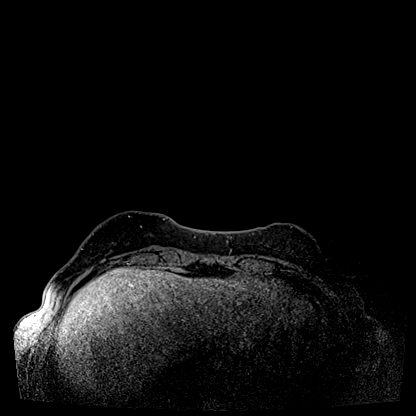
[im 44/176]
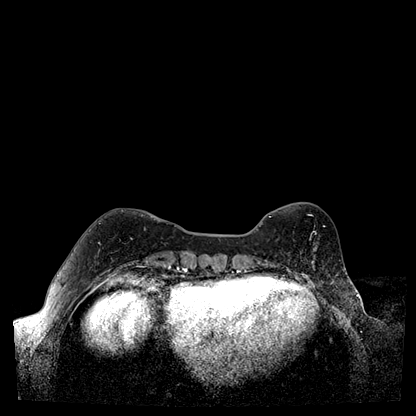
[im 88/176]
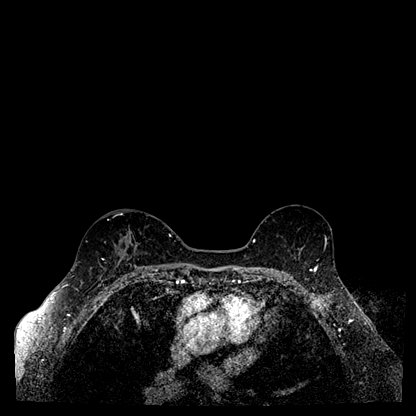
[im 132/176]
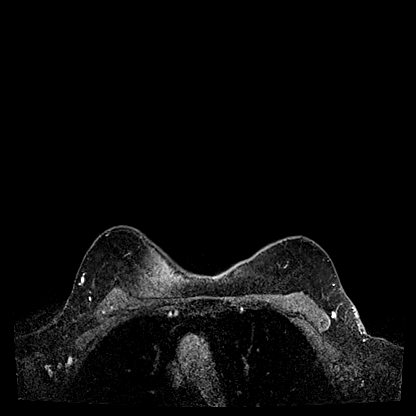
[im 176/176]
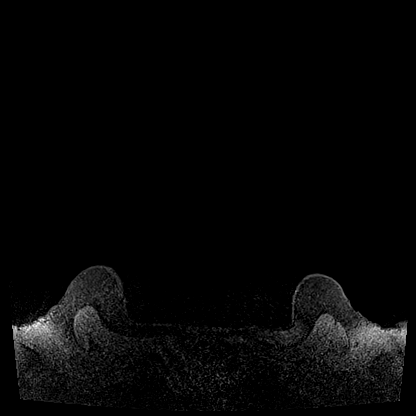

[Series 7: fl3d post-cm 20 · axial · 0.9mm · 0.79mm/px · z∈[-48,+109]mm · 5 of 176 slices shown (2 of 3)]
[im 1/176]
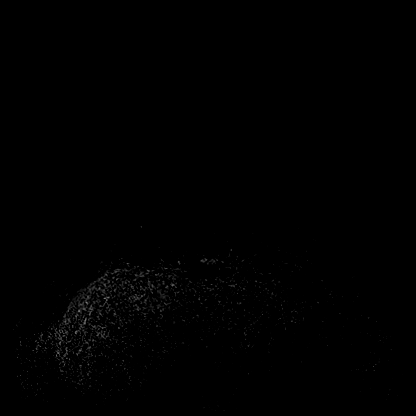
[im 44/176]
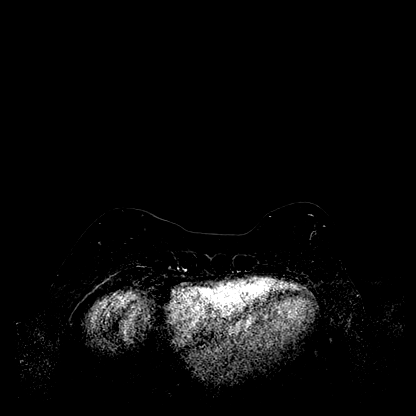
[im 88/176]
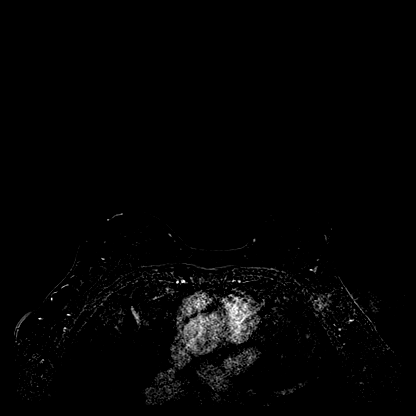
[im 132/176]
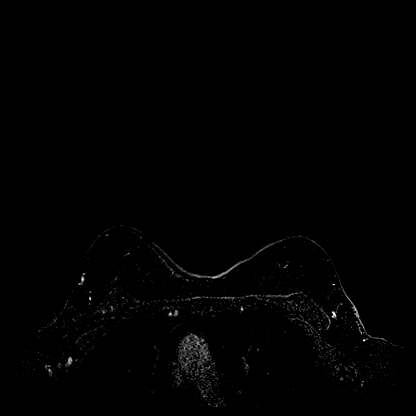
[im 176/176]
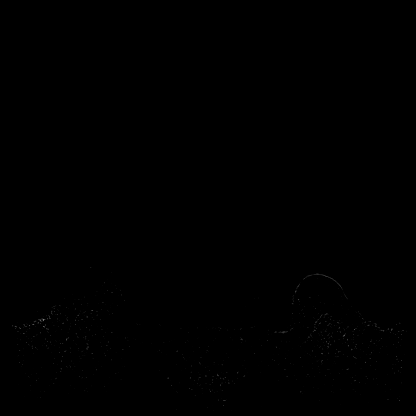

[Series 8: fl3d post-cm 20 · axial · 158.4mm · 0.79mm/px · 1 of 1 slices shown (3 of 3)]
[im 1/1]
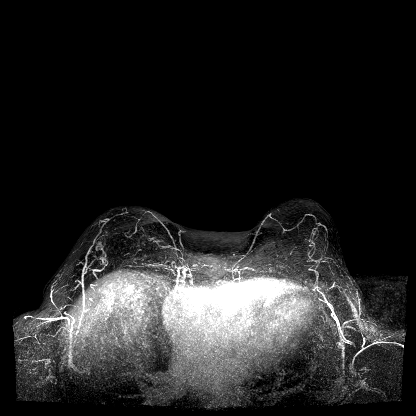

[Series 9: fl3d post-cm 3min · axial · 0.9mm · 0.79mm/px · z∈[-48,+109]mm · 5 of 176 slices shown]
[im 1/176]
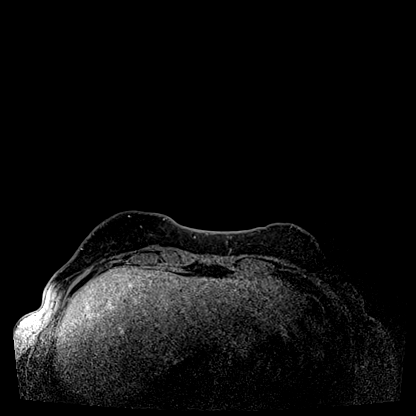
[im 44/176]
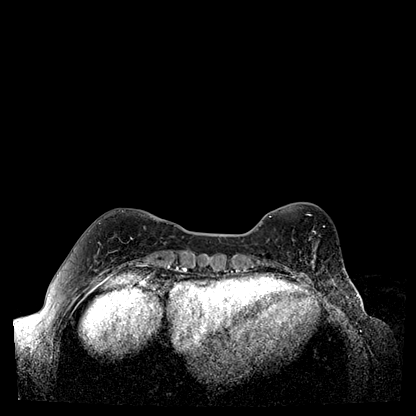
[im 88/176]
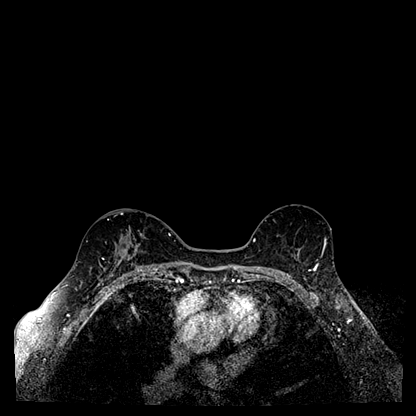
[im 132/176]
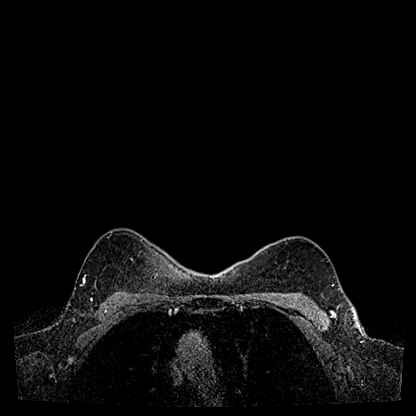
[im 176/176]
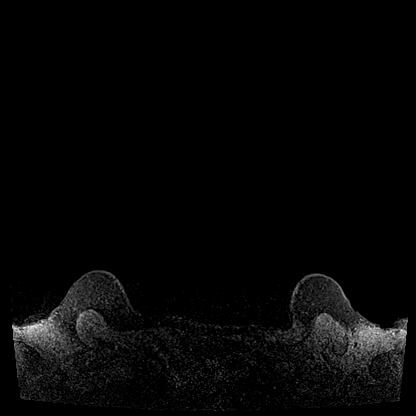

[Series 10: fl3d post-cm 3min_sub · axial · 0.9mm · 0.79mm/px · z∈[-48,+46]mm · 4 of 176 slices shown]
[im 1/176]
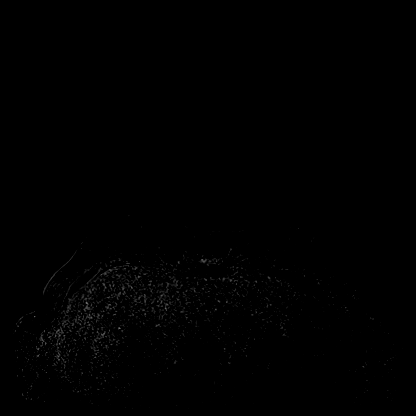
[im 36/176]
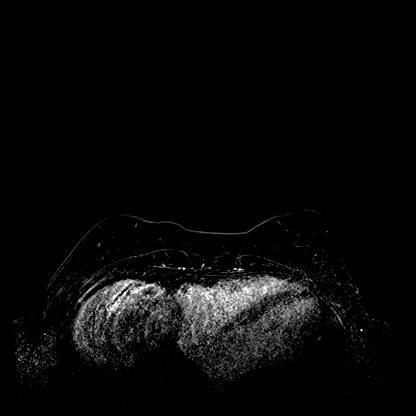
[im 71/176]
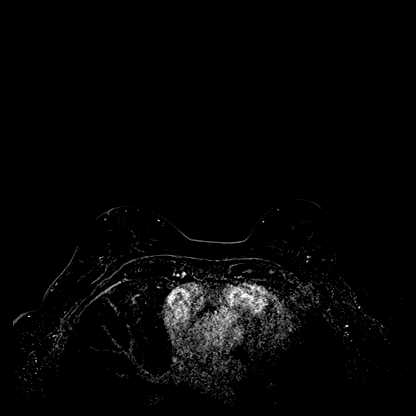
[im 106/176]
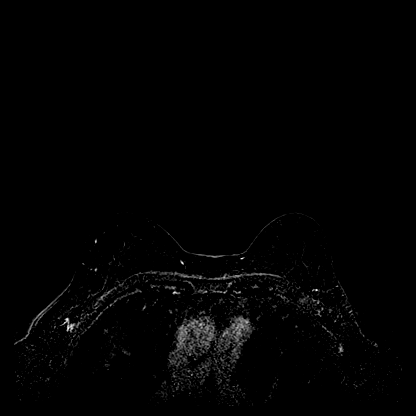

[30 of 48 positions shown; findings below may reference images not displayed]

Three-dimensional MR images were rendered by post-processing of the
original MR data on an independent workstation. The
three-dimensional MR images were interpreted, and findings are
reported in the following complete MRI report for this study. Three
dimensional images were evaluated at the independent interpreting
workstation using the DynaCAD thin client.
FINDINGS: Breast composition: c. Heterogeneous fibroglandular tissue.

Background parenchymal enhancement: Minimal

Right breast: No mass or abnormal enhancement. There are
postsurgical changes from prior lumpectomy in the upper inner right
breast.

Left breast: No mass or abnormal enhancement.

Lymph nodes: No abnormal appearing lymph nodes.

Ancillary findings:  None.
IMPRESSION: No MRI evidence of malignancy in either breast.

RECOMMENDATION:
1.  Continued annual screening mammography.

2. The American Cancer Society recommends annual MRI in patients
with an estimated lifetime risk of developing breast cancer greater
than 20 - 25%, or who are known or suspected to be positive for the
breast cancer gene.

BI-RADS CATEGORY  2: Benign.

## 2022-03-07 DIAGNOSIS — Z9189 Other specified personal risk factors, not elsewhere classified: Secondary | ICD-10-CM | POA: Diagnosis not present

## 2022-03-07 DIAGNOSIS — Z853 Personal history of malignant neoplasm of breast: Secondary | ICD-10-CM | POA: Diagnosis not present

## 2022-03-17 ENCOUNTER — Telehealth: Payer: Self-pay | Admitting: Podiatry

## 2022-03-17 NOTE — Telephone Encounter (Signed)
Pt was taken off the medication that you prescribe due to gasto intestinal  issues and since December when gastro doctor took her off.  She stated you put her on the medication for a long period of time and was not sure when you wanted to see her back.  She is not having any pain since getting the orthotics.

## 2022-03-18 DIAGNOSIS — I2510 Atherosclerotic heart disease of native coronary artery without angina pectoris: Secondary | ICD-10-CM | POA: Diagnosis not present

## 2022-03-18 DIAGNOSIS — I700 Atherosclerosis of aorta: Secondary | ICD-10-CM | POA: Diagnosis not present

## 2022-03-18 DIAGNOSIS — R109 Unspecified abdominal pain: Secondary | ICD-10-CM | POA: Diagnosis not present

## 2022-03-18 DIAGNOSIS — E7800 Pure hypercholesterolemia, unspecified: Secondary | ICD-10-CM | POA: Diagnosis not present

## 2022-04-14 DIAGNOSIS — L821 Other seborrheic keratosis: Secondary | ICD-10-CM | POA: Diagnosis not present

## 2022-04-14 DIAGNOSIS — Z85828 Personal history of other malignant neoplasm of skin: Secondary | ICD-10-CM | POA: Diagnosis not present

## 2022-04-14 DIAGNOSIS — I781 Nevus, non-neoplastic: Secondary | ICD-10-CM | POA: Diagnosis not present

## 2022-04-14 DIAGNOSIS — D1801 Hemangioma of skin and subcutaneous tissue: Secondary | ICD-10-CM | POA: Diagnosis not present

## 2022-04-14 DIAGNOSIS — L814 Other melanin hyperpigmentation: Secondary | ICD-10-CM | POA: Diagnosis not present

## 2022-04-15 ENCOUNTER — Other Ambulatory Visit (HOSPITAL_BASED_OUTPATIENT_CLINIC_OR_DEPARTMENT_OTHER): Payer: Self-pay | Admitting: Family

## 2022-04-15 DIAGNOSIS — I2510 Atherosclerotic heart disease of native coronary artery without angina pectoris: Secondary | ICD-10-CM

## 2022-04-15 DIAGNOSIS — E7800 Pure hypercholesterolemia, unspecified: Secondary | ICD-10-CM

## 2022-04-15 NOTE — Progress Notes (Signed)
Seen at time of her OV with her husband. Wishes to participate with her husband. Referral to PREP placed.  Loel Dubonnet, NP

## 2022-04-18 DIAGNOSIS — D23121 Other benign neoplasm of skin of left upper eyelid, including canthus: Secondary | ICD-10-CM | POA: Diagnosis not present

## 2022-04-22 ENCOUNTER — Telehealth: Payer: Self-pay

## 2022-04-22 NOTE — Telephone Encounter (Signed)
Called re: PREP program referral, left voicemail 

## 2022-04-29 ENCOUNTER — Telehealth: Payer: Self-pay

## 2022-04-29 NOTE — Telephone Encounter (Signed)
She returned my call on behalf of her and husband [phone] to PREP program; explained PREP, offered class dates in May/June for [employer]; they will discuss and call me back with dates they would like to attend.

## 2022-05-02 ENCOUNTER — Other Ambulatory Visit: Payer: Self-pay | Admitting: Cardiovascular Disease

## 2022-05-03 NOTE — Telephone Encounter (Signed)
Rx request sent to pharmacy.  

## 2022-05-05 DIAGNOSIS — R252 Cramp and spasm: Secondary | ICD-10-CM | POA: Diagnosis not present

## 2022-05-05 DIAGNOSIS — Z6821 Body mass index (BMI) 21.0-21.9, adult: Secondary | ICD-10-CM | POA: Diagnosis not present

## 2022-05-05 DIAGNOSIS — R5383 Other fatigue: Secondary | ICD-10-CM | POA: Diagnosis not present

## 2022-05-18 ENCOUNTER — Encounter (HOSPITAL_BASED_OUTPATIENT_CLINIC_OR_DEPARTMENT_OTHER): Payer: Self-pay | Admitting: Cardiovascular Disease

## 2022-05-19 NOTE — Telephone Encounter (Signed)
Please advise 

## 2022-05-20 DIAGNOSIS — R5383 Other fatigue: Secondary | ICD-10-CM | POA: Diagnosis not present

## 2022-05-23 ENCOUNTER — Ambulatory Visit (INDEPENDENT_AMBULATORY_CARE_PROVIDER_SITE_OTHER): Payer: Medicare Other | Admitting: Podiatry

## 2022-05-23 DIAGNOSIS — G629 Polyneuropathy, unspecified: Secondary | ICD-10-CM

## 2022-05-23 DIAGNOSIS — M722 Plantar fascial fibromatosis: Secondary | ICD-10-CM

## 2022-05-24 ENCOUNTER — Telehealth: Payer: Self-pay | Admitting: Allergy and Immunology

## 2022-05-24 NOTE — Telephone Encounter (Signed)
Patient called and said that her zyrtec works for about 8 to 10 hours but eyes are itching and what to know if she can take another one and if be safe. [phone].

## 2022-05-24 NOTE — Telephone Encounter (Signed)
Forwarding message to Dr. Lucie Leather for next step.

## 2022-05-25 NOTE — Progress Notes (Signed)
Subjective: Chief Complaint  Patient presents with   Plantar Fasciitis    Left foot flare out   72 year old female presents the office with above concerns.  She states that she has been doing well but she says unfortunately the orthotics.  She states does not feel as good as her Vionic sandals.  She is also stopped all of her supplements including alpha lipoic acid, nerve symptoms due to GI issues.  Overall states that she has been doing well otherwise.  Objective: AAO x3, NAD DP/PT pulses palpable bilaterally, CRT less than 3 seconds Some slight tenderness to palpation along the arch of the foot. I am not able to appreciate any area pinpoint tenderness.  No significant pain in the heel today.  Flexor, extensor tendons are intact.  MMT 5/5. No pain with calf compression, swelling, warmth, erythema  Assessment: 72 year old female with neuritis, arch pain  Plan: -All treatment options discussed with the patient including all alternatives, risks, complications.  -She is been doing well off of the alpha lipoic acid.  If she gets permission we discussed going to alpha lipoic acid 600 mg daily to help with the nerve symptoms.  Otherwise hold off on other medications for now until cleared to resume this.  I also think of trying to change her shoes to give her more stability with the orthotics would beneficial as opposed to her current [place] shoe.   Vivi Barrack DPM

## 2022-05-26 DIAGNOSIS — M154 Erosive (osteo)arthritis: Secondary | ICD-10-CM | POA: Diagnosis not present

## 2022-05-26 DIAGNOSIS — Z6822 Body mass index (BMI) 22.0-22.9, adult: Secondary | ICD-10-CM | POA: Diagnosis not present

## 2022-05-27 NOTE — Telephone Encounter (Signed)
Per Provider:  She can use zyrtec 1-2 times per day as long as she does not get sedated with the higher dose.   Called patient - DOB verified - advised of provider notation above.  Patient verbalized understanding, no further questions.

## 2022-06-10 DIAGNOSIS — E559 Vitamin D deficiency, unspecified: Secondary | ICD-10-CM | POA: Diagnosis not present

## 2022-06-10 DIAGNOSIS — R5383 Other fatigue: Secondary | ICD-10-CM | POA: Diagnosis not present

## 2022-06-17 DIAGNOSIS — E559 Vitamin D deficiency, unspecified: Secondary | ICD-10-CM | POA: Diagnosis not present

## 2022-06-17 DIAGNOSIS — R5383 Other fatigue: Secondary | ICD-10-CM | POA: Diagnosis not present

## 2022-06-20 DIAGNOSIS — G4719 Other hypersomnia: Secondary | ICD-10-CM | POA: Diagnosis not present

## 2022-06-20 DIAGNOSIS — E039 Hypothyroidism, unspecified: Secondary | ICD-10-CM | POA: Diagnosis not present

## 2022-06-22 ENCOUNTER — Other Ambulatory Visit: Payer: Self-pay | Admitting: Hematology and Oncology

## 2022-06-22 DIAGNOSIS — N6012 Diffuse cystic mastopathy of left breast: Secondary | ICD-10-CM

## 2022-06-23 DIAGNOSIS — R110 Nausea: Secondary | ICD-10-CM | POA: Diagnosis not present

## 2022-06-23 DIAGNOSIS — Z86010 Personal history of colonic polyps: Secondary | ICD-10-CM | POA: Diagnosis not present

## 2022-06-23 DIAGNOSIS — R140 Abdominal distension (gaseous): Secondary | ICD-10-CM | POA: Diagnosis not present

## 2022-06-23 DIAGNOSIS — K317 Polyp of stomach and duodenum: Secondary | ICD-10-CM | POA: Diagnosis not present

## 2022-06-30 DIAGNOSIS — J310 Chronic rhinitis: Secondary | ICD-10-CM | POA: Diagnosis not present

## 2022-06-30 DIAGNOSIS — J342 Deviated nasal septum: Secondary | ICD-10-CM | POA: Diagnosis not present

## 2022-06-30 DIAGNOSIS — J343 Hypertrophy of nasal turbinates: Secondary | ICD-10-CM | POA: Diagnosis not present

## 2022-06-30 DIAGNOSIS — H6123 Impacted cerumen, bilateral: Secondary | ICD-10-CM | POA: Diagnosis not present

## 2022-07-07 DIAGNOSIS — G4733 Obstructive sleep apnea (adult) (pediatric): Secondary | ICD-10-CM | POA: Diagnosis not present

## 2022-08-02 ENCOUNTER — Ambulatory Visit
Admission: RE | Admit: 2022-08-02 | Discharge: 2022-08-02 | Disposition: A | Discharge status: Home / Self Care | Payer: Medicare Other | Source: Ambulatory Visit | Attending: Hematology and Oncology | Admitting: Hematology and Oncology

## 2022-08-02 DIAGNOSIS — R921 Mammographic calcification found on diagnostic imaging of breast: Secondary | ICD-10-CM | POA: Diagnosis not present

## 2022-08-02 DIAGNOSIS — N6012 Diffuse cystic mastopathy of left breast: Secondary | ICD-10-CM

## 2022-08-22 ENCOUNTER — Ambulatory Visit: Payer: Medicare Other | Admitting: Podiatry

## 2022-08-22 ENCOUNTER — Encounter: Payer: Self-pay | Admitting: Podiatry

## 2022-08-22 DIAGNOSIS — M722 Plantar fascial fibromatosis: Secondary | ICD-10-CM

## 2022-08-22 DIAGNOSIS — M792 Neuralgia and neuritis, unspecified: Secondary | ICD-10-CM

## 2022-08-22 NOTE — Progress Notes (Signed)
Subjective: Chief Complaint  Patient presents with   Plantar Fasciitis    Pt came in for a follow up. Pt stated she has been doing well.     72 year old female presents the office with above concerns.  States he is doing well.  She does follow-up with her GI specialist and she was told that she can start to reintroduce alpha lipoic acid in the next couple of weeks.  She is still working but she is working on support as well.  No injuries or concerns otherwise.  Objective: AAO x3, NAD DP/PT pulses palpable bilaterally, CRT less than 3 seconds Some slight tenderness to palpation along the arch of the foot. I am not able to appreciate any area pinpoint tenderness.  There is no edema, erythema.  Flexor, extensor tendons are intact.  MMT pressure is 5.  No pain with calf compression, swelling, warmth, erythema  Assessment: 72 year old female with neuritis, arch pain  Plan: -All treatment options discussed with the patient including all alternatives, risks, complications.  -Once cleared by her GI specialist within the reintroduce alpha lipoic acid but will stick up to 60 mg daily monitor for any side effects. -Continue supportive shoes, good arch support.  Continue stretching exercises to help facilitate rehabilitation.  Vivi Barrack DPM

## 2022-08-24 DIAGNOSIS — Z961 Presence of intraocular lens: Secondary | ICD-10-CM | POA: Diagnosis not present

## 2022-08-24 DIAGNOSIS — H43813 Vitreous degeneration, bilateral: Secondary | ICD-10-CM | POA: Diagnosis not present

## 2022-08-24 DIAGNOSIS — H04123 Dry eye syndrome of bilateral lacrimal glands: Secondary | ICD-10-CM | POA: Diagnosis not present

## 2022-08-24 DIAGNOSIS — H0100A Unspecified blepharitis right eye, upper and lower eyelids: Secondary | ICD-10-CM | POA: Diagnosis not present

## 2022-08-24 DIAGNOSIS — Z79899 Other long term (current) drug therapy: Secondary | ICD-10-CM | POA: Diagnosis not present

## 2022-08-24 DIAGNOSIS — H0100B Unspecified blepharitis left eye, upper and lower eyelids: Secondary | ICD-10-CM | POA: Diagnosis not present

## 2022-09-12 DIAGNOSIS — G4733 Obstructive sleep apnea (adult) (pediatric): Secondary | ICD-10-CM | POA: Diagnosis not present

## 2022-09-23 DIAGNOSIS — N8110 Cystocele, unspecified: Secondary | ICD-10-CM | POA: Diagnosis not present

## 2022-09-23 DIAGNOSIS — N898 Other specified noninflammatory disorders of vagina: Secondary | ICD-10-CM | POA: Diagnosis not present

## 2022-09-23 DIAGNOSIS — N907 Vulvar cyst: Secondary | ICD-10-CM | POA: Diagnosis not present

## 2022-09-23 DIAGNOSIS — R102 Pelvic and perineal pain: Secondary | ICD-10-CM | POA: Diagnosis not present

## 2022-11-03 ENCOUNTER — Encounter: Payer: Self-pay | Admitting: Family Medicine

## 2022-11-03 ENCOUNTER — Ambulatory Visit: Payer: Medicare Other | Admitting: Family Medicine

## 2022-11-03 ENCOUNTER — Other Ambulatory Visit: Payer: Self-pay

## 2022-11-03 VITALS — BP 102/60 | HR 74 | Temp 97.6°F | Resp 20 | Wt 141.2 lb

## 2022-11-03 DIAGNOSIS — J301 Allergic rhinitis due to pollen: Secondary | ICD-10-CM | POA: Diagnosis not present

## 2022-11-03 DIAGNOSIS — Z91038 Other insect allergy status: Secondary | ICD-10-CM | POA: Diagnosis not present

## 2022-11-03 DIAGNOSIS — J3089 Other allergic rhinitis: Secondary | ICD-10-CM

## 2022-11-03 DIAGNOSIS — J4520 Mild intermittent asthma, uncomplicated: Secondary | ICD-10-CM | POA: Diagnosis not present

## 2022-11-03 DIAGNOSIS — K219 Gastro-esophageal reflux disease without esophagitis: Secondary | ICD-10-CM | POA: Insufficient documentation

## 2022-11-03 MED ORDER — VENTOLIN HFA 108 (90 BASE) MCG/ACT IN AERS: 2.0000 | INHALATION_SPRAY | RESPIRATORY_TRACT | 1 refills | Status: DC | PRN

## 2022-11-03 MED ORDER — FLUTICASONE PROPIONATE 50 MCG/ACT NA SUSP: 2.0000 | NASAL | 5 refills | Status: DC | PRN

## 2022-11-03 NOTE — Patient Instructions (Addendum)
  1. Avoidance measures - dust mite, pollens, cat, dog, stinging insects  2. Continue the following if needed:   A. Zyrtec 10 mg one tablet once or twice a day  B. Mucinex  C. Nasal saline followed by Flonase 1 spray in each nostril twice a day  D. Albuterol HFA 2 puffs every 4-6 hours  E. EpiPen  3. Can use Arnuity 200 - 1 inhalation daily starting 1 week prior to and during cat exposure.  4. Return to clinic in 1 year or earlier if problem.  Call the clinic if your cough worsens or if you develop a fever

## 2022-11-03 NOTE — Progress Notes (Signed)
[address] [place] [address] Dept: [phone]  FOLLOW UP NOTE  Patient ID: Valerie Snyder, Valerie Snyder    DOB: Feb 12, 1950  Age: 72 y.o. MRN: [id number] Date of Office Visit: 11/03/2022  Assessment  Chief Complaint: Follow-up  HPI Valerie Snyder is a 72 year old Valerie Snyder who presents to the clinic for follow-up visit.  She was last seen in this clinic on 02/22/2022 by Dr. Allena Katz for evaluation and treatment of an upper respiratory virus.  Prior to that she was seen in this clinic on 02/01/2022 by Dr. Lucie Leather for evaluation of asthma, allergic rhinitis, allergic conjunctivitis, reflux, and stinging insect allergy.  At today's visit, she reports that, over the last week, her allergic rhinitis has not been well controlled with thick post nasal drainage and cough producing thick clear mucus. She reports some nasal drainage and nasal congestion. She denies fever, sweats, chills, and sick contacts. She continues cetirizine 10 mg twice a day and uses saline nasal rinses twice a day. She reports that she has recently run out of Flonase. She reports excellent Flonase application technique.   Asthma is reported as moderately well controlled with cough producing clear phlegm as the main symptom. She reports shortness of breath only while coughing. She has used albuterol over the last few days with relief for a few moments only. She has previously used Arnuity 200 before exposure to a family member's cat. She reports that she has not used Arnuity 200 for almost 1 year.   Allergic conjunctivitis is reported as moderately well controlled with occasional red and itchy eyes. She continues Systane eyedrops up to 4 times a day with relief of symptoms.   Reflux is reported as well-controlled with no medical intervention.  She is taking a natural supplement at this time with complete relief of reflux or abdominal symptoms.  She continues to avoid stinging insects with no stings since her last visit to this  clinic.  Epinephrine autoinjector set is up-to-date.  Her current medications are listed in the chart.  Drug Allergies:  Allergies  Allergen Reactions   Bee Venom Anaphylaxis   Cephalexin Anaphylaxis    Puffy eyes and lips Other reaction(s): facial swelling   Ketorolac Tromethamine Anaphylaxis    Puffy lips and eyes Other reaction(s): anaphylaxis   Atorvastatin     Other reaction(s): muscle cramps   Rosuvastatin     Other reaction(s): muscle cramps Other reaction(s): Other (See Comments) Other reaction(s): muscle cramps   Simvastatin     Other reaction(s): muscle cramps Other reaction(s): muscle cramps, Other (See Comments) Other reaction(s): muscle cramps     Physical Exam: BP 102/60   Pulse 74   Temp 97.6 F (36.4 C) (Temporal)   Resp 20   Wt 141 lb 3.2 oz (64 kg)   SpO2 97%   BMI 23.50 kg/m    Physical Exam Vitals reviewed.  Constitutional:      Appearance: Normal appearance.  HENT:     Head: Normocephalic and atraumatic.     Right Ear: Tympanic membrane normal.     Left Ear: Tympanic membrane normal.     Nose:     Comments: Bilateral nares slightly erythematous with thin clear nasal drainage noted.  Slight septal deviation noted.  Pharynx normal.  Ears normal.  Eyes normal.    Mouth/Throat:     Pharynx: Oropharynx is clear.  Eyes:     Conjunctiva/sclera: Conjunctivae normal.  Cardiovascular:     Rate and Rhythm: Normal rate and regular rhythm.  Heart sounds: Normal heart sounds. No murmur heard. Pulmonary:     Effort: Pulmonary effort is normal.     Breath sounds: Normal breath sounds.     Comments: Lungs clear to auscultation Musculoskeletal:        General: Normal range of motion.     Cervical back: Normal range of motion and neck supple.  Skin:    General: Skin is warm and dry.  Neurological:     Mental Status: She is alert and oriented to person, place, and time.  Psychiatric:        Mood and Affect: Mood normal.        Behavior: Behavior  normal.        Thought Content: Thought content normal.        Judgment: Judgment normal.     Assessment and Plan: 1. Asthma, mild intermittent, well-controlled   2. Seasonal allergic rhinitis due to pollen   3. Perennial allergic rhinitis   4. Gastroesophageal reflux disease, unspecified whether esophagitis present   5. Hymenoptera allergy     Meds ordered this encounter  Medications   fluticasone (FLONASE) 50 MCG/ACT nasal spray    Sig: Place 2 sprays into both nostrils as needed.    Dispense:  16 g    Refill:  5   VENTOLIN HFA 108 (90 Base) MCG/ACT inhaler    Sig: Inhale 2 puffs into the lungs every 4 (four) hours as needed for wheezing or shortness of breath.    Dispense:  18 g    Refill:  1    Patient Instructions   1. Avoidance measures - dust mite, pollens, cat, dog, stinging insects  2. Continue the following if needed:   A. Zyrtec 10 mg one tablet once or twice a day  B. Mucinex  C. Nasal saline followed by Flonase 1 spray in each nostril twice a day  D. Albuterol HFA 2 puffs every 4-6 hours  E. EpiPen  3. Can use Arnuity 200 - 1 inhalation daily starting 1 week prior to and during cat exposure.  4. Return to clinic in 1 year or earlier if problem.  Call the clinic if your cough worsens or if you develop a fever    Return in about 1 year (around 11/03/2023), or if symptoms worsen or fail to improve.    Thank you for the opportunity to care for this patient.  Please do not hesitate to contact me with questions.  Thermon Leyland, FNP [facility]

## 2022-11-24 ENCOUNTER — Ambulatory Visit (INDEPENDENT_AMBULATORY_CARE_PROVIDER_SITE_OTHER): Payer: Medicare Other | Admitting: Podiatry

## 2022-11-24 ENCOUNTER — Ambulatory Visit (INDEPENDENT_AMBULATORY_CARE_PROVIDER_SITE_OTHER): Payer: Medicare Other

## 2022-11-24 ENCOUNTER — Encounter: Payer: Self-pay | Admitting: Podiatry

## 2022-11-24 DIAGNOSIS — S92514A Nondisplaced fracture of proximal phalanx of right lesser toe(s), initial encounter for closed fracture: Secondary | ICD-10-CM | POA: Diagnosis not present

## 2022-11-24 DIAGNOSIS — M792 Neuralgia and neuritis, unspecified: Secondary | ICD-10-CM

## 2022-11-24 DIAGNOSIS — M722 Plantar fascial fibromatosis: Secondary | ICD-10-CM

## 2022-11-24 NOTE — Progress Notes (Signed)
Subjective: Chief Complaint  Patient presents with   Routine Post Op    PATIENT STATES THAT SHE HIT HER RF TOES ON COFFEE TABLE AND THEY BEEN HURTING REALLY BAD , AD ANKLE  PATIENT STATES HER LF HAVING PAIN IN THE ARCH OF THE FOOT AND OUTSIDE OF ANKLE , SHE TAKES ALA FOR THE CONDITION.   72 year old female presents the office with above concerns.  She states that she had her toes on her left foot and Is able to palpate some swelling and pain to the toes particularly along the fourth toe.  She still has ongoing pain to the arch, heel.  As per the nursing that she is taking alpha lipoic acid again without any side effects.  Objective: AAO x3, NAD DP/PT pulses palpable bilaterally, CRT less than 3 seconds Hammertoes are present.  There is edema present of the toes rheumatoid tenderness is localized to the fourth toe.  There are no open lesions. There is ongoing discomfort in the arch of the foot on the course of the plantar fascia on the course of the arch of the foot as well as the plantar heel.  There is no erythema point tenderness. Negative Tinel sign. No pain with calf compression, swelling, warmth, erythema  Assessment: Plantar fasciitis, neuropathy, toe injury  Plan: -All treatment options discussed with the patient including all alternatives, risks, complications.  -X-rays obtained reviewed.  3 views of the routine.  Will continue the fourth toe consistent with likely fracture.  No displacement. -For the fracture discussed wearing stiffer, supportive shoe.  Discussed responding.  Ice, elevation. -Referral sent to PT for dry needling -Continue alpha-lipoic acid. Can also do B-complex vitamin to help with the nerves -Patient encouraged to call the office with any questions, concerns, change in symptoms.   Return in about 3 months (around 02/24/2023), or if symptoms worsen or fail to improve, for neuropathy .  Valerie Snyder DPM

## 2022-11-29 ENCOUNTER — Institutional Professional Consult (permissible substitution) (INDEPENDENT_AMBULATORY_CARE_PROVIDER_SITE_OTHER): Payer: Medicare Other

## 2022-11-30 DIAGNOSIS — Z6824 Body mass index (BMI) 24.0-24.9, adult: Secondary | ICD-10-CM | POA: Diagnosis not present

## 2022-11-30 DIAGNOSIS — M79641 Pain in right hand: Secondary | ICD-10-CM | POA: Diagnosis not present

## 2022-11-30 DIAGNOSIS — M19042 Primary osteoarthritis, left hand: Secondary | ICD-10-CM | POA: Diagnosis not present

## 2022-11-30 DIAGNOSIS — M19041 Primary osteoarthritis, right hand: Secondary | ICD-10-CM | POA: Diagnosis not present

## 2022-12-05 ENCOUNTER — Other Ambulatory Visit: Payer: Self-pay | Admitting: Family Medicine

## 2022-12-05 DIAGNOSIS — Z1231 Encounter for screening mammogram for malignant neoplasm of breast: Secondary | ICD-10-CM

## 2022-12-07 DIAGNOSIS — I700 Atherosclerosis of aorta: Secondary | ICD-10-CM | POA: Diagnosis not present

## 2022-12-07 DIAGNOSIS — E039 Hypothyroidism, unspecified: Secondary | ICD-10-CM | POA: Diagnosis not present

## 2022-12-07 DIAGNOSIS — E559 Vitamin D deficiency, unspecified: Secondary | ICD-10-CM | POA: Diagnosis not present

## 2022-12-07 DIAGNOSIS — R053 Chronic cough: Secondary | ICD-10-CM | POA: Diagnosis not present

## 2022-12-07 DIAGNOSIS — Z0000 Encounter for general adult medical examination without abnormal findings: Secondary | ICD-10-CM | POA: Diagnosis not present

## 2022-12-07 DIAGNOSIS — I2510 Atherosclerotic heart disease of native coronary artery without angina pectoris: Secondary | ICD-10-CM | POA: Diagnosis not present

## 2022-12-07 DIAGNOSIS — E7800 Pure hypercholesterolemia, unspecified: Secondary | ICD-10-CM | POA: Diagnosis not present

## 2022-12-07 DIAGNOSIS — G4733 Obstructive sleep apnea (adult) (pediatric): Secondary | ICD-10-CM | POA: Diagnosis not present

## 2022-12-08 ENCOUNTER — Encounter (INDEPENDENT_AMBULATORY_CARE_PROVIDER_SITE_OTHER): Payer: Self-pay

## 2022-12-08 ENCOUNTER — Ambulatory Visit (INDEPENDENT_AMBULATORY_CARE_PROVIDER_SITE_OTHER): Payer: Medicare Other | Admitting: Otolaryngology

## 2022-12-08 VITALS — Ht 64.0 in | Wt 141.0 lb

## 2022-12-08 DIAGNOSIS — R1310 Dysphagia, unspecified: Secondary | ICD-10-CM

## 2022-12-08 DIAGNOSIS — R0982 Postnasal drip: Secondary | ICD-10-CM | POA: Diagnosis not present

## 2022-12-08 DIAGNOSIS — T17308A Unspecified foreign body in larynx causing other injury, initial encounter: Secondary | ICD-10-CM

## 2022-12-08 DIAGNOSIS — R0989 Other specified symptoms and signs involving the circulatory and respiratory systems: Secondary | ICD-10-CM

## 2022-12-08 NOTE — Progress Notes (Signed)
Dear Dr. Atha Starks, Here is my assessment for our mutual patient, Valerie Snyder. Thank you for allowing me the opportunity to care for your patient. Please do not hesitate to contact me should you have any other questions. Sincerely, Dr. Jovita Kussmaul  Otolaryngology Clinic Note Referring provider: Dr. Atha Starks HPI:  Valerie Snyder is a 72 y.o. female kindly referred by Dr. Atha Starks for evaluation of dysphagia Has had more trouble swallowing recently. She reports that she seems to have trouble with solids, liquids, and even sometimes saliva --- some choking episodes. Also with some dry cough and throat clearing but no obvious triggers. She feels like when she is on the CPAP, her mucus is thicker. Also has some PND. She has had esophageal dilation x2 (few years ago most recently), and now due for Endoscopy in December 2024. It did help with her dysphagia prior. She does nasal saline spray, and flonase as well as uses Ventolin. Water: at least 90 oz/day  No hemoptysis, no odynophagia, no ear pain, no shortness of breath, normal voice, and no neck masses. No heimlich, no overt aspiration episodes, no regurgitation, no sinusitis cardinal symptoms Never had a swallow test before it appears.   She is on homeopathic called Atrantil for GERD/Bloating. Denies GERD sx now Dr. Vincente Poli did prescribed Pantoprazole - doing a course for 2 weeks, not really helping. She does see Allergy as well for Asthma, Reflux, and season AR  H&N Surgery: no Personal or FHx of bleeding dz or anesthesia difficulty: no  AP/AC: no  PMHx: Allergic rhinitis, Asthma, Allergic Conjuncitivitis, GERD, OSA on CPAP, Breast cancer (treated)   Tobacco: no. Alcohol: no.  Independent Review of Additional Tests or Records:  Referral notes reviewed I am unable to see review the GI notes [facility] 2018: unable to review images, but no noted significant sinus opacification noted with attention to nasal cavity and paranasal sinuses   MRI brain 2018: independent review, no signficant paranasal sinus disease noted PMH/Meds/All/SocHx/FamHx/ROS:   Past Medical History:  Diagnosis Date   Abnormal uterine bleeding (AUB)    Allergy    Asthma    allergy related   Breast cancer of 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  72nd percentile 11/2018.   Complication of anesthesia    10 years ago- pt states she stopped breathing and "had to be bagged"   Concussion 2018   GERD (gastroesophageal reflux disease)    H/O bilateral inguinal hernia repair 1992   Hypercholesteremia    Hypothyroidism    Migraines    Personal history of radiation therapy    Plantar fasciitis, bilateral    Pure hypercholesterolemia 10/10/2018   Radiation 01/12/15-02/04/15   right breast 42.17 gray   Spinal stenosis    Thyroid disease      Past Surgical History:  Procedure Laterality Date   BLADDER SUSPENSION     BLEPHAROPLASTY Bilateral    BREAST BIOPSY Left 01/28/2022   MM LT BREAST BX W LOC DEV 1ST LESION IMAGE BX SPEC STEREO GUIDE 01/28/2022 [facility]   BREAST LUMPECTOMY Right 12/10/2014   BREAST LUMPECTOMY WITH RADIOACTIVE SEED LOCALIZATION Right 12/10/2014   Procedure: RIGHT BREAST LUMPECTOMY WITH RADIOACTIVE SEED LOCALIZATION;  Surgeon: Chevis Pretty III, MD;  Location: [facility];  Service: General;  Laterality: Right;   CHOLECYSTECTOMY N/A 02/20/2021   Procedure: LAPAROSCOPIC CHOLECYSTECTOMY;  Surgeon: Axel Filler, MD;  Location[facility];  Service:  General;  Laterality: N/A;   DILATATION & CURETTAGE/HYSTEROSCOPY WITH MYOSURE N/A 07/15/2020   Procedure: DILATATION & CURETTAGE/HYSTEROSCOPY WITHPOLYPECTOMY USING MYOSURE;  Surgeon: Gerald Leitz, MD;  Location: [facility];  Service: Gynecology;  Laterality: N/A;   FOOT SURGERY     plantar fashiectomies from both feet   HERNIA REPAIR      HYSTEROSCOPY WITH D & C N/A 12/27/2017   Procedure: DILATATION AND CURETTAGE /HYSTEROSCOPY;  Surgeon: Myna Hidalgo, DO;  Location: [facility];  Service: Gynecology;  Laterality: N/A;   NASAL SEPTUM SURGERY     neuromas removed from right foot     osteoma  1991   removal from right orbital   POLYPECTOMY  12/27/2017   Procedure: POLYPECTOMY;  Surgeon: Myna Hidalgo, DO;  Location: [facility];  Service: Gynecology;;   removal of basal cell carcinoma (nose)     TONSILECTOMY/ADENOIDECTOMY WITH MYRINGOTOMY     TONSILLECTOMY      Family History  Problem Relation Age of Onset   Pancreatic cancer Maternal Aunt        dx. 40s-early 50s   Congestive Heart Failure Mother 23   Lung disease Mother    Colon polyps Mother        unspecified number   Dementia Father        Lewy Body Dementia   Kidney failure Father    Stroke Father    Depression Father    Multiple sclerosis Sister    Depression Sister    Aneurysm Maternal Grandmother        brain   Heart attack Maternal Grandfather    Emphysema Paternal Grandfather    Heart Problems Paternal Grandfather    Other Sister        3 sisters have history of cysts in uterus   Depression Sister    Breast cancer Cousin        dx. 14-50   Testicular cancer Cousin        dx. 30s   Colon polyps Daughter        unspecified number     Social Connections: Not on file      Current Outpatient Medications:    cetirizine (ZYRTEC) 10 MG tablet, Take 1 tablet (10 mg total) by mouth daily as needed for allergies., Disp: 30 tablet, Rfl: 5   cholecalciferol (VITAMIN D3) 25 MCG (1000 UNIT) tablet, Take 1,000 Units by mouth daily., Disp: , Rfl:    EPINEPHrine (EPIPEN 2-PAK) 0.3 mg/0.3 mL IJ SOAJ injection, Inject 0.3 mg into the muscle as needed for anaphylaxis., Disp: 1 each, Rfl: 1   fluticasone (FLONASE) 50 MCG/ACT nasal spray, Place 2 sprays into both nostrils as needed., Disp: 16 g, Rfl: 5   hydroxychloroquine  (PLAQUENIL) 200 MG tablet, Take 200 mg by mouth in the morning and at bedtime., Disp: , Rfl:    levothyroxine (SYNTHROID) 75 MCG tablet, Take 75 mcg by mouth daily before breakfast., Disp: , Rfl:    Misc Natural Products (ATRANTIL) CAPS, 1 capsule Orally Twice a day, Disp: , Rfl:    pantoprazole (PROTONIX) 40 MG tablet, Take 1 tablet by mouth daily at 12 noon., Disp: , Rfl:    PRALUENT 150 MG/ML SOAJ, INJECT 150 MG INTO THE SKIN EVERY 14 DAYS, Disp: 2 mL, Rfl: 11   VENTOLIN HFA 108 (90 Base) MCG/ACT inhaler, Inhale 2 puffs into the lungs every 4 (four) hours as needed for wheezing or shortness of breath., Disp: 18 g, Rfl: 1  fluticasone (VERAMYST) 27.5 MCG/SPRAY nasal spray, Place 1 spray into the nose daily., Disp: , Rfl:    Fluticasone Furoate (ARNUITY ELLIPTA) 100 MCG/ACT AEPB, 1 inhalation daily starting 1 week prior to and during cat exposure. With respiratory illness, start 1 puff daily for 1-2 weeks. (Patient not taking: Reported on 11/03/2022), Disp: 30 each, Rfl: 5   Probiotic Product (SOLUBLE FIBER/PROBIOTICS PO), Take 1 capsule by mouth daily. (Patient not taking: Reported on 12/08/2022), Disp: , Rfl:    traMADol (ULTRAM) 50 MG tablet, Take 1 tablet (50 mg total) by mouth every 6 (six) hours as needed for severe pain or moderate pain., Disp: 30 tablet, Rfl: 0   Wheat Dextrin (BENEFIBER) POWD, Take 10 mLs by mouth daily., Disp: , Rfl:    Physical Exam:   Ht 5\' 4"  (1.626 m)   Wt 141 lb (64 kg)   BMI 24.20 kg/m   Salient findings:  CN II-XII intact  Bilateral EAC clear and TM intact with well pneumatized middle ear spaces Anterior rhinoscopy: Septum relatively midline; bilateral inferior turbinates without significant hypertrophy No lesions of oral cavity/oropharynx No obviously palpable neck masses/lymphadenopathy/thyromegaly No respiratory distress or stridor; voice quality class I; given complaints, TFL was indicated for further evaluation and documented below  Seprately  Identifiable Procedures:  Procedure Note Pre-procedure diagnosis:  Dysphagia, post nasal drip, choking Post-procedure diagnosis: Same Procedure: Transnasal Fiberoptic Laryngoscopy, CPT 40981 - Mod 25 Indication: Dysphagia, post nasal drip, choking Complications: None apparent EBL: 0 mL Date: 12/08/2022  The procedure was undertaken to further evaluate the patient's complaint of dysphagia, post nasal drip, choking, with mirror exam inadequate for appropriate examination due to gag reflex and poor patient tolerance  Procedure:  Patient was identified as correct patient. Verbal consent was obtained. The nose was sprayed with oxymetazoline and 4% lidocaine. The The flexible laryngoscope was passed through the nose to view the nasal cavity, pharynx (oropharynx, hypopharynx) and larynx.  The larynx was examined at rest and during multiple phonatory tasks. Documentation was obtained and reviewed with patient. The scope was removed. The patient tolerated the procedure well.  Findings: The nasal cavity and nasopharynx did not reveal any masses or lesions, mucosa appeared to be without obvious lesions. The tongue base, pharyngeal walls, piriform sinuses, vallecula, epiglottis and postcricoid region are normal in appearance without significant retained secretions. The visualized portion of the subglottis and proximal trachea is widely patent. The vocal folds are mobile bilaterally. There are no lesions on the free edge of the vocal folds nor elsewhere in the larynx worrisome for malignancy.       Electronically signed by: Read Drivers, MD 12/10/2022 8:23 PM   Impression & Plans:  Valerie Snyder is a 72 y.o. female with history of dysphagia and GERD (follows with GI, prior EGDs - though unable to review) and prior esophageal dilations now with: Dysphagia and choking - worsening, now with intermittent choking episodes. No other symptoms, and TFL today is reassuring. Given her history, however, would  recommend undergoing further workup with MBS to rule out at least UES stricture or other pathology. - Continue GERD meds per GI - Has upcoming endo with GI per patient, will request their notes after Post nasal drip - Reports mostly in morning, using CPAP. Wonder if this is what is making her secretions thicker. No other sinonasal symptoms. Would start with increased humidfication with AYR gel q4h PRN (especially prior to sleeping) and continuing hydration as first step  - f/u 8 weeks  Thank you for allowing me the opportunity to care for your patient. Please do not hesitate to contact me should you have any other questions.  Sincerely, Jovita Kussmaul, MD Otolarynoglogist (ENT), [facility] Phone: [phone] Fax: [phone]  12/08/2022, 1:14 PM

## 2022-12-08 NOTE — Patient Instructions (Signed)
I have ordered an imaging study for you to complete prior to your next visit. Please call [facility] Scheduling at [phone] to schedule your imaging if you have not received a call within 24 hours. If you are unable to complete your imaging study prior to your next scheduled visit please call our office to let us know.

## 2022-12-09 ENCOUNTER — Telehealth (HOSPITAL_COMMUNITY): Payer: Self-pay | Admitting: *Deleted

## 2022-12-09 NOTE — Telephone Encounter (Signed)
Attempted to contact patient to schedule OP MBS. Left VM. RKEEL

## 2022-12-10 ENCOUNTER — Encounter (INDEPENDENT_AMBULATORY_CARE_PROVIDER_SITE_OTHER): Payer: Self-pay | Admitting: Otolaryngology

## 2022-12-12 ENCOUNTER — Other Ambulatory Visit (HOSPITAL_COMMUNITY): Payer: Self-pay | Admitting: *Deleted

## 2022-12-12 DIAGNOSIS — R059 Cough, unspecified: Secondary | ICD-10-CM

## 2022-12-12 DIAGNOSIS — R1310 Dysphagia, unspecified: Secondary | ICD-10-CM

## 2022-12-19 ENCOUNTER — Ambulatory Visit: Payer: Medicare Other | Attending: Podiatry

## 2022-12-19 ENCOUNTER — Other Ambulatory Visit: Payer: Self-pay

## 2022-12-19 DIAGNOSIS — M722 Plantar fascial fibromatosis: Secondary | ICD-10-CM | POA: Diagnosis not present

## 2022-12-19 DIAGNOSIS — R262 Difficulty in walking, not elsewhere classified: Secondary | ICD-10-CM | POA: Insufficient documentation

## 2022-12-19 NOTE — Therapy (Signed)
OUTPATIENT PHYSICAL THERAPY LOWER EXTREMITY EVALUATION   Patient Name: Valerie Snyder MRN: [id number] DOB:24-Oct-1950, 72 y.o., female Today's Date: 12/19/2022  END OF SESSION:  PT End of Session - 12/19/22 1659     Visit Number 1    Date for PT Re-Evaluation 01/30/23    Progress Note Due on Visit 10    PT Start Time 1430    PT Stop Time 1515    PT Time Calculation (min) 45 min    Activity Tolerance Patient tolerated treatment well    Behavior During Therapy [facility] for tasks assessed/performed             Past Medical History:  Diagnosis Date   Abnormal uterine bleeding (AUB)    Allergy    Asthma    allergy related   Breast cancer of 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  72nd percentile 11/2018.   Complication of anesthesia    10 years ago- pt states she stopped breathing and "had to be bagged"   Concussion 2018   GERD (gastroesophageal reflux disease)    H/O bilateral inguinal hernia repair 1992   Hypercholesteremia    Hypothyroidism    Migraines    Personal history of radiation therapy    Plantar fasciitis, bilateral    Pure hypercholesterolemia 10/10/2018   Radiation 01/12/15-02/04/15   right breast 42.17 gray   Spinal stenosis    Thyroid disease    Past Surgical History:  Procedure Laterality Date   BLADDER SUSPENSION     BLEPHAROPLASTY Bilateral    BREAST BIOPSY Left 01/28/2022   MM LT BREAST BX W LOC DEV 1ST LESION IMAGE BX SPEC STEREO GUIDE 01/28/2022 [facility]   BREAST LUMPECTOMY Right 12/10/2014   BREAST LUMPECTOMY WITH RADIOACTIVE SEED LOCALIZATION Right 12/10/2014   Procedure: RIGHT BREAST LUMPECTOMY WITH RADIOACTIVE SEED LOCALIZATION;  Surgeon: Chevis Pretty III, MD;  Location: [facility];  Service: General;  Laterality: Right;   CHOLECYSTECTOMY N/A 02/20/2021   Procedure: LAPAROSCOPIC CHOLECYSTECTOMY;   Surgeon: Axel Filler, MD;  Location: [facility] OR;  Service: General;  Laterality: N/A;   DILATATION & CURETTAGE/HYSTEROSCOPY WITH MYOSURE N/A 07/15/2020   Procedure: DILATATION & CURETTAGE/HYSTEROSCOPY Carollee Leitz USING MYOSURE;  Surgeon: Gerald Leitz, MD;  Location: [facility];  Service: Gynecology;  Laterality: N/A;   FOOT SURGERY     plantar fashiectomies from both feet   HERNIA REPAIR     HYSTEROSCOPY WITH D & C N/A 12/27/2017   Procedure: DILATATION AND CURETTAGE /HYSTEROSCOPY;  Surgeon: Myna Hidalgo, DO;  Location: [facility];  Service: Gynecology;  Laterality: N/A;   NASAL SEPTUM SURGERY     neuromas removed from right foot     osteoma  1991   removal from right orbital   POLYPECTOMY  12/27/2017   Procedure: POLYPECTOMY;  Surgeon: Myna Hidalgo, DO;  Location:  SURGERY CENTER;  Service: Gynecology;;   removal of basal cell carcinoma (nose)     TONSILECTOMY/ADENOIDECTOMY WITH MYRINGOTOMY     TONSILLECTOMY     Patient Active Problem List   Diagnosis Date Noted   Seasonal allergic rhinitis due to pollen 11/03/2022   Perennial allergic rhinitis 11/03/2022   Gastroesophageal reflux disease 11/03/2022   Hymenoptera allergy 11/03/2022   Cholecystitis 02/19/2021   CAD in native artery 01/15/2019   Pure hypercholesterolemia 10/10/2018   Genetic testing 12/10/2014   Breast cancer of upper-inner quadrant of  right female breast (HCC) 11/13/2014   Allergic rhinoconjunctivitis 10/22/2014   Asthma, well controlled 10/22/2014   History of basal cell cancer 03/21/2014   Visit for wound check 07/29/2013   Visit for suture removal 07/11/2013   Basal cell carcinoma of nasal tip 07/04/2013   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   Neoplasm of uncertain behavior of skin 06/19/2013   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   Lentigo 12/02/2011   Melanocytic nevi of trunk 12/02/2011   SK (seborrheic keratosis) 12/02/2011   BCC (basal cell carcinoma) 01/29/1898     PCP: Jackelyn Poling, DO  REFERRING PROVIDER: Ovid Curd, DPM  REFERRING DIAG: B plantarfasciitis  THERAPY DIAG:  Plantar fasciitis, bilateral - Plan: PT PLAN OF CARE CERT/RE-CERT  Difficulty in walking, not elsewhere classified - Plan: PT PLAN OF CARE CERT/RE-CERT  Rationale for Evaluation and Treatment: Rehabilitation  ONSET DATE: chronic, greater than 10 years  SUBJECTIVE:   SUBJECTIVE STATEMENT: B feet painful arch , more with walking uneven terrain and after long distances  PERTINENT HISTORY: Reports plantar fasciitis for years, had release surgery on one foot and extracorporal shock therapy on another in 2013.  Did undergo skilled PT a year ago and still utilizes the exercises provided at that encounter , primarily to maintain her ankle flexibility B . Was referred to PT by her podiatrist to address her foot pain, specifically requested trial of TPDN. PAIN:  Are you having pain? Yes: NPRS scale: 0 to5/10 Pain location: B plantar fascia and heel, plantar surface Pain description: deep sharp pain,with some radiating numbness into forefeet Aggravating factors: prolonged walking, especially walking on uneven surfaces, also getting in and out of husband's truck Relieving factors: ice, rolling frozen drink bottle under arch  PRECAUTIONS: None  RED FLAGS: None   WEIGHT BEARING RESTRICTIONS: No  FALLS:  Has patient fallen in last 6 months? No  LIVING ENVIRONMENT: Lives with: lives with their spouse Lives in: House/apartment Stairs: one flight in house Has following equipment at home: None  OCCUPATION: retired Runner, broadcasting/film/video  PLOF: Independent  PATIENT GOALS: improve pain feet, manage pain better, maintain fitness without foot pain  NEXT MD VISIT: unknown  OBJECTIVE:  Note: Objective measures were completed at Evaluation unless otherwise noted.  DIAGNOSTIC FINDINGS: radiology R foot with recent 5th MT head fx   PATIENT SURVEYS:  FAAM  66/84  COGNITION: Overall cognitive status: Within functional limits for tasks assessed     SENSATION: WFL L lateral lower leg with tingling per pt   EDEMA:  None noted  POSTURE: observation of B feet in standing with no navicular drop, rigid, high B arches  PALPATION: Tender, with thickened plantar fascial central B arch.  LOWER EXTREMITY ROM:B ankle dorsiflexion 15 degrees, all other ROM wnl  LOWER EXTREMITY MMT:  MMT Right eval Left eval  Hip flexion 4+/5 4/5  Hip extension    Hip abduction 4/5 4/5  Hip adduction    Hip internal rotation    Hip external rotation 4/5 4-/5  Knee flexion    Knee extension    Ankle dorsiflexion    Ankle plantarflexion    Ankle inversion    Ankle eversion     (Blank rows = wfl) Functional strength testing:  able to complete 15 reps unilateral heel raise each foot  Forward heel taps from 2" step, able to complete 15 with R LE, L 9 , both with increased sway, poor control Lock bridge, wnl B gluts FUNCTIONAL TESTS:  Unilateral standing L 13 sec, R  30 sec, noted much trunk sway each leg  GAIT: Distance walked: in clinic Assistive device utilized: None Level of assistance: Complete Independence Comments: mild decreased stance time R, tends to avoid toe off R in terminal stance   TODAY'S TREATMENT:                                                                                                                              DATE: 12/19/22: eval    PATIENT EDUCATION:  Education details: education in appropriate foot wear, also POC, goals Person educated: Patient Education method: Explanation, Demonstration, and Tactile cues Education comprehension: verbalized understanding, returned demonstration, and verbal cues required  HOME EXERCISE PROGRAM: TBD  ASSESSMENT:  CLINICAL IMPRESSION: Patient is a 72 y.o. female who was evaluated today by skilled physical therapy for dx of B plantar faciitis.  She does have a consistent routine  of stretching B ankle plantarflexors which she performs daily.  Findings today reveal excellent B ankle ROM, rigidity B mid and rear foot/  she has painful, thickened plantar fascia B.  Her strength B ankles is wfl, she does have deficits in lateral hip musculature which may be affecting her pain on walking on uneven surfaces.  She also demonstrates decreased stability with functional tasks such as unilateral balancing, and with eccentric lowering with forward step downs.   Would recommend skilled physical therapy to address her mid foot, rear foot mobility, as well as lateral hip strength and balance deficits so that she may have increased tolerance to prolonged weight bearing B feet. May benefit from dry needling B plantar foot and ankle musculature. OBJECTIVE IMPAIRMENTS: decreased activity tolerance, decreased balance, decreased endurance, decreased strength, hypomobility, increased fascial restrictions, postural dysfunction, and pain.   ACTIVITY LIMITATIONS: carrying and locomotion level  PARTICIPATION LIMITATIONS: cleaning, laundry, shopping, and community activity  PERSONAL FACTORS: Age, Behavior pattern, Time since onset of injury/illness/exacerbation, and 1-2 comorbidities: diffuse osteoarthritis, h/o plantar fascia release B   are also affecting patient's functional outcome.   REHAB POTENTIAL: Fair    CLINICAL DECISION MAKING: Evolving/moderate complexity  EVALUATION COMPLEXITY: Moderate   GOALS: Goals reviewed with patient? Yes  SHORT TERM GOALS: Target date: 2 weeks 01/02/23 I HEP  Baseline: Goal status: INITIAL  LONG TERM GOALS: Target date: 01/30/23, 6 weeks   FAAM 78/84 from 66/84 Baseline:  Goal status: INITIAL  2.  Able to unilaterally balance on each foot greater than 30 sec without sway Baseline: L 9 sec, R 30 sec Goal status: INITIAL  3.  Able to complete 15 forward heel taps from 4" step without UE support, demonstrating improved eccentric control for uneven  terrain Baseline:  Goal status: INITIAL  4.  B hip Er strength 5/5 Baseline: 4-/5 Goal status: INITIAL    PLAN:  PT FREQUENCY: 2x/week  PT DURATION: 6 weeks  PLANNED INTERVENTIONS: 97110-Therapeutic exercises, 97530- Therapeutic activity, 97112- Neuromuscular re-education, 97535- Self Care, 19147- Manual therapy, and Dry Needling  PLAN FOR  NEXT SESSION: instruct in lateral hip strengthening, ? TPDN, jt mobs mid foot B Balance training B   Rudolph Dobler L Jazminn Pomales, PT, DPT, OCS 12/19/2022, 5:04 PM

## 2022-12-21 ENCOUNTER — Ambulatory Visit: Payer: Medicare Other

## 2022-12-21 ENCOUNTER — Other Ambulatory Visit: Payer: Self-pay

## 2022-12-21 DIAGNOSIS — M722 Plantar fascial fibromatosis: Secondary | ICD-10-CM | POA: Diagnosis not present

## 2022-12-21 DIAGNOSIS — R262 Difficulty in walking, not elsewhere classified: Secondary | ICD-10-CM

## 2022-12-21 NOTE — Therapy (Signed)
OUTPATIENT PHYSICAL THERAPY LOWER EXTREMITY TREATMENT   Patient Name: Valerie Snyder MRN: [id number] DOB:11-Sep-1950, 72 y.o., female Today's Date: 12/21/2022  END OF SESSION:  PT End of Session - 12/21/22 1201     Visit Number 2    Date for PT Re-Evaluation 01/30/23    Progress Note Due on Visit 10    PT Start Time 1058    PT Stop Time 1150    PT Time Calculation (min) 52 min    Activity Tolerance Patient tolerated treatment well    Behavior During Therapy [facility] for tasks assessed/performed              Past Medical History:  Diagnosis Date   Abnormal uterine bleeding (AUB)    Allergy    Asthma    allergy related   Breast cancer of 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  72nd percentile 11/2018.   Complication of anesthesia    10 years ago- pt states she stopped breathing and "had to be bagged"   Concussion 2018   GERD (gastroesophageal reflux disease)    H/O bilateral inguinal hernia repair 1992   Hypercholesteremia    Hypothyroidism    Migraines    Personal history of radiation therapy    Plantar fasciitis, bilateral    Pure hypercholesterolemia 10/10/2018   Radiation 01/12/15-02/04/15   right breast 42.17 gray   Spinal stenosis    Thyroid disease    Past Surgical History:  Procedure Laterality Date   BLADDER SUSPENSION     BLEPHAROPLASTY Bilateral    BREAST BIOPSY Left 01/28/2022   MM LT BREAST BX W LOC DEV 1ST LESION IMAGE BX SPEC STEREO GUIDE 01/28/2022 [facility]   BREAST LUMPECTOMY Right 12/10/2014   BREAST LUMPECTOMY WITH RADIOACTIVE SEED LOCALIZATION Right 12/10/2014   Procedure: RIGHT BREAST LUMPECTOMY WITH RADIOACTIVE SEED LOCALIZATION;  Surgeon: Chevis Pretty III, MD;  Location: [facility];  Service: General;  Laterality: Right;   CHOLECYSTECTOMY N/A 02/20/2021   Procedure: LAPAROSCOPIC CHOLECYSTECTOMY;   Surgeon: Axel Filler, MD;  Location: [facility] OR;  Service: General;  Laterality: N/A;   DILATATION & CURETTAGE/HYSTEROSCOPY WITH MYOSURE N/A 07/15/2020   Procedure: DILATATION & CURETTAGE/HYSTEROSCOPY Carollee Leitz USING MYOSURE;  Surgeon: Gerald Leitz, MD;  Location: [facility];  Service: Gynecology;  Laterality: N/A;   FOOT SURGERY     plantar fashiectomies from both feet   HERNIA REPAIR     HYSTEROSCOPY WITH D & C N/A 12/27/2017   Procedure: DILATATION AND CURETTAGE /HYSTEROSCOPY;  Surgeon: Myna Hidalgo, DO;  Location: [facility];  Service: Gynecology;  Laterality: N/A;   NASAL SEPTUM SURGERY     neuromas removed from right foot     osteoma  1991   removal from right orbital   POLYPECTOMY  12/27/2017   Procedure: POLYPECTOMY;  Surgeon: Myna Hidalgo, DO;  Location: [facility];  Service: Gynecology;;   removal of basal cell carcinoma (nose)     TONSILECTOMY/ADENOIDECTOMY WITH MYRINGOTOMY     TONSILLECTOMY     Patient Active Problem List   Diagnosis Date Noted   Seasonal allergic rhinitis due to pollen 11/03/2022   Perennial allergic rhinitis 11/03/2022   Gastroesophageal reflux disease 11/03/2022   Hymenoptera allergy 11/03/2022   Cholecystitis 02/19/2021   CAD in native artery 01/15/2019   Pure hypercholesterolemia 10/10/2018   Genetic testing 12/10/2014   Breast cancer of upper-inner quadrant  of right female breast (HCC) 11/13/2014   Allergic rhinoconjunctivitis 10/22/2014   Asthma, well controlled 10/22/2014   History of basal cell cancer 03/21/2014   Visit for wound check 07/29/2013   Visit for suture removal 07/11/2013   Basal cell carcinoma of nasal tip 07/04/2013   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   Neoplasm of uncertain behavior of skin 06/19/2013   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   Lentigo 12/02/2011   Melanocytic nevi of trunk 12/02/2011   SK (seborrheic keratosis) 12/02/2011   BCC (basal cell carcinoma) 01/29/1898     PCP: Jackelyn Poling, DO  REFERRING PROVIDER: Ovid Curd, DPM  REFERRING DIAG: B plantarfasciitis  THERAPY DIAG:  Plantar fasciitis, bilateral  Difficulty in walking, not elsewhere classified  Rationale for Evaluation and Treatment: Rehabilitation  ONSET DATE: chronic, greater than 10 years  SUBJECTIVE:   SUBJECTIVE STATEMENT: 12/21/22:  Felt some abdominal soreness after initial treatment this week I think from all of the hip MMT  no difference in foot pain B feet painful arch , more with walking uneven terrain and after long distances  PERTINENT HISTORY: EVAL:Reports plantar fasciitis for years, had release surgery on one foot and extracorporal shock therapy on another in 2013.  Did undergo skilled PT a year ago and still utilizes the exercises provided at that encounter , primarily to maintain her ankle flexibility B . Was referred to PT by her podiatrist to address her foot pain, specifically requested trial of TPDN. PAIN:  Are you having pain? Yes: NPRS scale: 0 to5/10 Pain location: B plantar fascia and heel, plantar surface Pain description: deep sharp pain,with some radiating numbness into forefeet Aggravating factors: prolonged walking, especially walking on uneven surfaces, also getting in and out of husband's truck Relieving factors: ice, rolling frozen drink bottle under arch  PRECAUTIONS: None  RED FLAGS: None   WEIGHT BEARING RESTRICTIONS: No  FALLS:  Has patient fallen in last 6 months? No  LIVING ENVIRONMENT: Lives with: lives with their spouse Lives in: House/apartment Stairs: one flight in house Has following equipment at home: None  OCCUPATION: retired Runner, broadcasting/film/video  PLOF: Independent  PATIENT GOALS: improve pain feet, manage pain better, maintain fitness without foot pain  NEXT MD VISIT: unknown  OBJECTIVE:  Note: Objective measures were completed at Evaluation unless otherwise noted.  DIAGNOSTIC FINDINGS: radiology R foot with recent  5th MT head fx   PATIENT SURVEYS:  FAAM 66/84  COGNITION: Overall cognitive status: Within functional limits for tasks assessed     SENSATION: WFL L lateral lower leg with tingling per pt   EDEMA:  None noted  POSTURE: observation of B feet in standing with no navicular drop, rigid, high B arches  PALPATION: Tender, with thickened plantar fascial central B arch.  LOWER EXTREMITY ROM:B ankle dorsiflexion 15 degrees, all other ROM wnl  LOWER EXTREMITY MMT:  MMT Right eval Left eval  Hip flexion 4+/5 4/5  Hip extension    Hip abduction 4/5 4/5  Hip adduction    Hip internal rotation    Hip external rotation 4/5 4-/5  Knee flexion    Knee extension    Ankle dorsiflexion    Ankle plantarflexion    Ankle inversion    Ankle eversion     (Blank rows = wfl) Functional strength testing:  able to complete 15 reps unilateral heel raise each foot  Forward heel taps from 2" step, able to complete 15 with R LE, L 9 , both with increased sway, poor control Lock bridge,  wnl B gluts FUNCTIONAL TESTS:  Unilateral standing L 13 sec, R 30 sec, noted much trunk sway each leg  GAIT: Distance walked: in clinic Assistive device utilized: None Level of assistance: Complete Independence Comments: mild decreased stance time R, tends to avoid toe off R in terminal stance   TODAY'S TREATMENT:                                                                                                                              DATE: 12/19/22: eval  12/21/22: Prone for brief myfascial release B plantarflexors  Manual: Trigger Point Dry-Needling  Treatment instructions: Expect mild to moderate muscle soreness. S/S of pneumothorax if dry needled over a lung field, and to seek immediate medical attention should they occur. Patient verbalized understanding of these instructions and education. Patient Consent Given: Yes Education handout provided: No Muscles treated: B glastrocs med and lat, B  soleus Treatment response/outcome: Twitch Response Elicited and Palpable Increase in Muscle Length    Jt mobs, A /P mid foot, cuneiforms , navicular, gr 2, with cardboard roll under MT heads , to increase mid foot mobility Manual distraction each foot tarsal s from mid foot  Therex: Added the following: -Seated  medial /lateral in/ev stretch with foot on larger inflated ball, starting rearfoot, and progressing to forefoot, then with tennis ball -seated IN/EV stretch foot on smooth side of 1/2 foam roller -seated hip ER strengthening with green t band around thighs  PATIENT EDUCATION:  Education details: education in appropriate foot wear, also POC, goals Person educated: Patient Education method: Explanation, Demonstration, and Tactile cues Education comprehension: verbalized understanding, returned demonstration, and verbal cues required  HOME EXERCISE PROGRAM: TBD  ASSESSMENT:  CLINICAL IMPRESSION: Patient is a 72 y.o. female who participated today in skilled physical therapy for dx of B plantar faciitis.  Today addressed mid foot and rearfoot rigidity with jt mobs and therex, also introduced one strengthening ex for hip ER.  Will assess again next visit regarding her response today's techniques, and continue.    OBJECTIVE IMPAIRMENTS: decreased activity tolerance, decreased balance, decreased endurance, decreased strength, hypomobility, increased fascial restrictions, postural dysfunction, and pain.   ACTIVITY LIMITATIONS: carrying and locomotion level  PARTICIPATION LIMITATIONS: cleaning, laundry, shopping, and community activity  PERSONAL FACTORS: Age, Behavior pattern, Time since onset of injury/illness/exacerbation, and 1-2 comorbidities: diffuse osteoarthritis, h/o plantar fascia release B   are also affecting patient's functional outcome.   REHAB POTENTIAL: Fair    CLINICAL DECISION MAKING: Evolving/moderate complexity  EVALUATION COMPLEXITY: Moderate   GOALS: Goals  reviewed with patient? Yes  SHORT TERM GOALS: Target date: 2 weeks 01/02/23 I HEP  Baseline: Goal status: INITIAL  LONG TERM GOALS: Target date: 01/30/23, 6 weeks   FAAM 78/84 from 66/84 Baseline:  Goal status: INITIAL  2.  Able to unilaterally balance on each foot greater than 30 sec without sway Baseline: L 9 sec, R 30 sec Goal status: INITIAL  3.  Able to complete 15 forward heel taps from 4" step without UE support, demonstrating improved eccentric control for uneven terrain Baseline:  Goal status: INITIAL  4.  B hip Er strength 5/5 Baseline: 4-/5 Goal status: INITIAL    PLAN:  PT FREQUENCY: 2x/week  PT DURATION: 6 weeks  PLANNED INTERVENTIONS: 97110-Therapeutic exercises, 97530- Therapeutic activity, 97112- Neuromuscular re-education, 97535- Self Care, [id number]- Manual therapy, and Dry Needling  PLAN FOR NEXT SESSION: instruct in lateral hip strengthening, ? TPDN, jt mobs mid foot B Balance training B   Vivian Okelley L Balbina Depace, PT, DPT, OCS 12/21/2022, 12:02 PM

## 2022-12-23 ENCOUNTER — Ambulatory Visit (HOSPITAL_COMMUNITY)
Admission: RE | Admit: 2022-12-23 | Discharge: 2022-12-23 | Disposition: A | Discharge status: Home / Self Care | Payer: Medicare Other | Source: Ambulatory Visit | Attending: Family Medicine | Admitting: Family Medicine

## 2022-12-23 DIAGNOSIS — I2510 Atherosclerotic heart disease of native coronary artery without angina pectoris: Secondary | ICD-10-CM | POA: Diagnosis not present

## 2022-12-23 DIAGNOSIS — R059 Cough, unspecified: Secondary | ICD-10-CM | POA: Diagnosis not present

## 2022-12-23 DIAGNOSIS — J45909 Unspecified asthma, uncomplicated: Secondary | ICD-10-CM | POA: Diagnosis not present

## 2022-12-23 DIAGNOSIS — K219 Gastro-esophageal reflux disease without esophagitis: Secondary | ICD-10-CM | POA: Insufficient documentation

## 2022-12-23 DIAGNOSIS — Z853 Personal history of malignant neoplasm of breast: Secondary | ICD-10-CM | POA: Diagnosis not present

## 2022-12-23 DIAGNOSIS — R1310 Dysphagia, unspecified: Secondary | ICD-10-CM | POA: Insufficient documentation

## 2022-12-23 DIAGNOSIS — Z923 Personal history of irradiation: Secondary | ICD-10-CM | POA: Insufficient documentation

## 2022-12-23 DIAGNOSIS — R0989 Other specified symptoms and signs involving the circulatory and respiratory systems: Secondary | ICD-10-CM | POA: Insufficient documentation

## 2022-12-28 ENCOUNTER — Ambulatory Visit: Payer: Medicare Other

## 2023-01-03 ENCOUNTER — Ambulatory Visit: Payer: Medicare Other | Attending: Podiatry | Admitting: Physical Therapy

## 2023-01-03 ENCOUNTER — Encounter: Payer: Self-pay | Admitting: Physical Therapy

## 2023-01-03 DIAGNOSIS — M25571 Pain in right ankle and joints of right foot: Secondary | ICD-10-CM | POA: Insufficient documentation

## 2023-01-03 DIAGNOSIS — M722 Plantar fascial fibromatosis: Secondary | ICD-10-CM

## 2023-01-03 DIAGNOSIS — M25572 Pain in left ankle and joints of left foot: Secondary | ICD-10-CM | POA: Diagnosis not present

## 2023-01-03 DIAGNOSIS — R262 Difficulty in walking, not elsewhere classified: Secondary | ICD-10-CM | POA: Diagnosis not present

## 2023-01-03 NOTE — Therapy (Signed)
OUTPATIENT PHYSICAL THERAPY LOWER EXTREMITY TREATMENT   Patient Name: Farra Vonasek MRN: [id number] DOB:30-Jul-1950, 72 y.o., female Today's Date: 01/03/2023  END OF SESSION:  PT End of Session - 01/03/23 1457     Visit Number 3    Date for PT Re-Evaluation 01/30/23    Progress Note Due on Visit 10    PT Start Time 1432    PT Stop Time 1516    PT Time Calculation (min) 44 min    Activity Tolerance Patient tolerated treatment well    Behavior During Therapy [facility] for tasks assessed/performed               Past Medical History:  Diagnosis Date   Abnormal uterine bleeding (AUB)    Allergy    Asthma    allergy related   Breast cancer of 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  72nd percentile 11/2018.   Complication of anesthesia    10 years ago- pt states she stopped breathing and "had to be bagged"   Concussion 2018   GERD (gastroesophageal reflux disease)    H/O bilateral inguinal hernia repair 1992   Hypercholesteremia    Hypothyroidism    Migraines    Personal history of radiation therapy    Plantar fasciitis, bilateral    Pure hypercholesterolemia 10/10/2018   Radiation 01/12/15-02/04/15   right breast 42.17 gray   Spinal stenosis    Thyroid disease    Past Surgical History:  Procedure Laterality Date   BLADDER SUSPENSION     BLEPHAROPLASTY Bilateral    BREAST BIOPSY Left 01/28/2022   MM LT BREAST BX W LOC DEV 1ST LESION IMAGE BX SPEC STEREO GUIDE 01/28/2022 [facility]   BREAST LUMPECTOMY Right 12/10/2014   BREAST LUMPECTOMY WITH RADIOACTIVE SEED LOCALIZATION Right 12/10/2014   Procedure: RIGHT BREAST LUMPECTOMY WITH RADIOACTIVE SEED LOCALIZATION;  Surgeon: Chevis Pretty III, MD;  Location: [facility];  Service: General;  Laterality: Right;   CHOLECYSTECTOMY N/A 02/20/2021   Procedure: LAPAROSCOPIC CHOLECYSTECTOMY;   Surgeon: Axel Filler, MD;  Location: [facility];  Service: General;  Laterality: N/A;   DILATATION & CURETTAGE/HYSTEROSCOPY WITH MYOSURE N/A 07/15/2020   Procedure: DILATATION & CURETTAGE/HYSTEROSCOPY Carollee Leitz USING MYOSURE;  Surgeon: Gerald Leitz, MD;  Location: [facility];  Service: Gynecology;  Laterality: N/A;   FOOT SURGERY     plantar fashiectomies from both feet   HERNIA REPAIR     HYSTEROSCOPY WITH D & C N/A 12/27/2017   Procedure: DILATATION AND CURETTAGE /HYSTEROSCOPY;  Surgeon: Myna Hidalgo, DO;  Location: [facility];  Service: Gynecology;  Laterality: N/A;   NASAL SEPTUM SURGERY     neuromas removed from right foot     osteoma  1991   removal from right orbital   POLYPECTOMY  12/27/2017   Procedure: POLYPECTOMY;  Surgeon: Myna Hidalgo, DO;  Location: [facility];  Service: Gynecology;;   removal of basal cell carcinoma (nose)     TONSILECTOMY/ADENOIDECTOMY WITH MYRINGOTOMY     TONSILLECTOMY     Patient Active Problem List   Diagnosis Date Noted   Seasonal allergic rhinitis due to pollen 11/03/2022   Perennial allergic rhinitis 11/03/2022   Gastroesophageal reflux disease 11/03/2022   Hymenoptera allergy 11/03/2022   Cholecystitis 02/19/2021   CAD in native artery 01/15/2019   Pure hypercholesterolemia 10/10/2018   Genetic testing 12/10/2014   Breast cancer of upper-inner  quadrant of right female breast (HCC) 11/13/2014   Allergic rhinoconjunctivitis 10/22/2014   Asthma, well controlled 10/22/2014   History of basal cell cancer 03/21/2014   Visit for wound check 07/29/2013   Visit for suture removal 07/11/2013   Basal cell carcinoma of nasal tip 07/04/2013   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   Neoplasm of uncertain behavior of skin 06/19/2013   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 I brought some of the toys that I use and the noodle that Amy recommended.   PERTINENT HISTORY: EVAL:Reports plantar fasciitis for years, had release surgery on one foot and extracorporal shock therapy on another in 2013.  Did undergo skilled PT a year ago and still utilizes the exercises provided at that encounter , primarily to maintain her ankle flexibility B . Was referred to PT by her podiatrist to address her foot pain, specifically requested trial of TPDN. PAIN:  Are you having pain? Yes: NPRS scale: 0 to5/10 Pain location: B plantar fascia and heel, plantar surface Pain description: deep sharp pain,with some radiating numbness into forefeet Aggravating factors: prolonged walking, especially walking on uneven surfaces, also getting in and out of husband's truck Relieving factors: ice, rolling frozen drink bottle under arch  PRECAUTIONS: None  RED FLAGS: None   WEIGHT BEARING RESTRICTIONS: No  FALLS:  Has patient fallen in last 6 months? No  LIVING ENVIRONMENT: Lives with: lives with their spouse Lives in: House/apartment Stairs: one flight in house Has following equipment at home: None  OCCUPATION: retired Runner, broadcasting/film/video  PLOF: Independent  PATIENT GOALS: improve pain feet, manage pain better, maintain fitness without foot pain  NEXT MD VISIT: unknown  OBJECTIVE:  Note: Objective measures were completed at Evaluation unless otherwise noted.  DIAGNOSTIC FINDINGS: radiology R foot with recent 5th MT head fx   PATIENT SURVEYS:  FAAM  66/84  COGNITION: Overall cognitive status: Within functional limits for tasks assessed     SENSATION: WFL L lateral lower leg with tingling per pt   EDEMA:  None noted  POSTURE: observation of B feet in standing with no navicular drop, rigid, high B arches  PALPATION: Tender, with thickened plantar fascial central B arch.  LOWER EXTREMITY ROM:B ankle dorsiflexion 15 degrees, all other ROM wnl  LOWER EXTREMITY MMT:  MMT Right eval Left eval  Hip flexion 4+/5 4/5  Hip extension    Hip abduction 4/5 4/5  Hip adduction    Hip internal rotation    Hip external rotation 4/5 4-/5  Knee flexion    Knee extension    Ankle dorsiflexion    Ankle plantarflexion    Ankle inversion    Ankle eversion     (Blank rows = wfl) Functional strength testing:  able to complete 15 reps unilateral heel raise each foot  Forward heel taps from 2" step, able to complete 15 with R LE, L 9 , both with increased sway, poor control Lock bridge, wnl B gluts FUNCTIONAL TESTS:  Unilateral standing L 13 sec, R 30 sec, noted much trunk sway each leg  GAIT: Distance walked: in clinic Assistive device utilized: None Level of assistance: Complete Independence Comments: mild decreased stance time R, tends to avoid toe off R in terminal stance   TODAY'S TREATMENT:                                                                                                                              DATE:  01/03/23  TherEx  Nustep L3x8 minutes BLEs for w/u and tissue perfusion  Sidelying hip ABD x10 B 0#  Manual  Forefoot inversion/eversion, great toe/MTP movs, calcaneal mobs, ankle dorsiflexion mobs, PF stretching, gastroc stretching  B   Selfcare  Lots of discussion about shoes and orthotics, good shoe fit, wearing schedules if they are making her feet hurt, 500 mile shoe wear limit, role of general deconditioning and weakness on gait pattern and possible increased plantar fascia pain     12/19/22: eval  12/21/22: Prone for brief myfascial release B plantarflexors  Manual: Trigger Point Dry-Needling  Treatment instructions: Expect mild to moderate muscle soreness. S/S of pneumothorax if dry needled over a lung field, and to seek immediate medical attention should they occur. Patient verbalized understanding of these instructions and education. Patient Consent Given: Yes Education handout provided: No Muscles treated: B glastrocs med and lat, B soleus Treatment response/outcome: Twitch Response Elicited and Palpable Increase in Muscle Length    Jt mobs, A /P mid foot, cuneiforms , navicular, gr 2, with cardboard roll under MT heads , to increase mid foot mobility Manual distraction each foot tarsal s from mid foot  Therex: Added the following: -Seated  medial /lateral in/ev stretch with foot on larger inflated ball, starting rearfoot, and progressing to forefoot, then with tennis ball -seated IN/EV stretch foot on smooth side of 1/2 foam roller -seated hip ER strengthening with green t band around thighs  PATIENT EDUCATION:  Education details: education in appropriate foot wear, also POC, goals Person educated: Patient Education method: Explanation, Demonstration, and Tactile cues Education comprehension: verbalized understanding, returned demonstration, and verbal cues required  HOME EXERCISE PROGRAM: TBD  ASSESSMENT:  CLINICAL IMPRESSION:  Pt arrives today doing OK, we warmed up on Nustep then worked on some functional hip strengthening followed manual interventions to her feet today. Still having a lot of issues with plantar fasciitis pain but compliant with POC and home recommendations. Will continue efforts.   OBJECTIVE IMPAIRMENTS: decreased activity tolerance, decreased balance, decreased endurance, decreased strength, hypomobility, increased fascial restrictions, postural dysfunction, and pain.   ACTIVITY LIMITATIONS: carrying and locomotion  level  PARTICIPATION LIMITATIONS: cleaning, laundry, shopping, and community activity  PERSONAL FACTORS: Age, Behavior pattern, Time since onset of injury/illness/exacerbation, and 1-2 comorbidities: diffuse osteoarthritis, h/o plantar fascia release B   are also affecting patient's functional outcome.   REHAB POTENTIAL: Fair    CLINICAL DECISION MAKING: Evolving/moderate complexity  EVALUATION COMPLEXITY: Moderate  GOALS: Goals reviewed with patient? Yes  SHORT TERM GOALS: Target date: 2 weeks 01/02/23 I HEP  Baseline: Goal status: MET 01/03/23  LONG TERM GOALS: Target date: 01/30/23, 6 weeks   FAAM 78/84 from 66/84 Baseline:  Goal status: INITIAL  2.  Able to unilaterally balance on each foot greater than 30 sec without sway Baseline: L 9 sec, R 30 sec Goal status: INITIAL  3.  Able to complete 15 forward heel taps from 4" step without UE support, demonstrating improved eccentric control for uneven terrain Baseline:  Goal status: INITIAL  4.  B hip Er strength 5/5 Baseline: 4-/5 Goal status: INITIAL    PLAN:  PT FREQUENCY: 2x/week  PT DURATION: 6 weeks  PLANNED INTERVENTIONS: 97110-Therapeutic exercises, 97530- Therapeutic activity, 97112- Neuromuscular re-education, 97535- Self Care, 16109- Manual therapy, and Dry Needling  PLAN FOR NEXT SESSION: instruct in lateral hip strengthening, ? TPDN, jt mobs mid foot B Balance training B, consider re-trying K-tape and DN?   Nedra Hai, PT, DPT 01/03/23 3:16 PM

## 2023-01-04 ENCOUNTER — Telehealth: Payer: Self-pay | Admitting: Cardiovascular Disease

## 2023-01-04 DIAGNOSIS — E7800 Pure hypercholesterolemia, unspecified: Secondary | ICD-10-CM

## 2023-01-04 NOTE — Telephone Encounter (Signed)
Previous fasting lipid and CMP done in Dec 2023. Pt inquiring if needing labs prior to next appt. Attempted to call but no answer - sent a MyChart message informing her of new fasting lipid and CMP needed prior to March 2025 appt. Lab orders mailed.

## 2023-01-04 NOTE — Telephone Encounter (Signed)
Patient is scheduled for 04/27/23 with Dr. Duke Salvia and would like to know if she needs to have labs completed before her appt. Please advise

## 2023-01-05 ENCOUNTER — Ambulatory Visit: Payer: Medicare Other | Admitting: Physical Therapy

## 2023-01-05 DIAGNOSIS — M25571 Pain in right ankle and joints of right foot: Secondary | ICD-10-CM | POA: Diagnosis not present

## 2023-01-05 DIAGNOSIS — R262 Difficulty in walking, not elsewhere classified: Secondary | ICD-10-CM | POA: Diagnosis not present

## 2023-01-05 DIAGNOSIS — M25572 Pain in left ankle and joints of left foot: Secondary | ICD-10-CM | POA: Diagnosis not present

## 2023-01-05 DIAGNOSIS — M722 Plantar fascial fibromatosis: Secondary | ICD-10-CM | POA: Diagnosis not present

## 2023-01-05 NOTE — Therapy (Signed)
OUTPATIENT PHYSICAL THERAPY LOWER EXTREMITY TREATMENT   Patient Name: Valerie Snyder MRN: [id number] DOB:[birth year], 72 y.o., female Today's Date: 01/05/2023  END OF SESSION:  PT End of Session - 01/05/23 1420     Visit Number 4    Date for PT Re-Evaluation 01/30/23    Progress Note Due on Visit 10    PT Start Time 1430    PT Stop Time 1515    PT Time Calculation (min) 45 min    Activity Tolerance Patient tolerated treatment well    Behavior During Therapy [facility] for tasks assessed/performed              Past Medical History:  Diagnosis Date   Abnormal uterine bleeding (AUB)    Allergy    Asthma    allergy related   Breast cancer of 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  72nd percentile 11/2018.   Complication of anesthesia    10 years ago- pt states she stopped breathing and "had to be bagged"   Concussion 2018   GERD (gastroesophageal reflux disease)    H/O bilateral inguinal hernia repair 1992   Hypercholesteremia    Hypothyroidism    Migraines    Personal history of radiation therapy    Plantar fasciitis, bilateral    Pure hypercholesterolemia 10/10/2018   Radiation 01/12/15-02/04/15   right breast 42.17 gray   Spinal stenosis    Thyroid disease    Past Surgical History:  Procedure Laterality Date   BLADDER SUSPENSION     BLEPHAROPLASTY Bilateral    BREAST BIOPSY Left 01/28/2022   MM LT BREAST BX W LOC DEV 1ST LESION IMAGE BX SPEC STEREO GUIDE 01/28/2022 [facility]   BREAST LUMPECTOMY Right 12/10/2014   BREAST LUMPECTOMY WITH RADIOACTIVE SEED LOCALIZATION Right 12/10/2014   Procedure: RIGHT BREAST LUMPECTOMY WITH RADIOACTIVE SEED LOCALIZATION;  Surgeon: Chevis Pretty III, MD;  Location: [facility];  Service: General;  Laterality: Right;   CHOLECYSTECTOMY N/A 02/20/2021   Procedure: LAPAROSCOPIC CHOLECYSTECTOMY;   Surgeon: Axel Filler, MD;  Location: [facility];  Service: General;  Laterality: N/A;   DILATATION & CURETTAGE/HYSTEROSCOPY WITH MYOSURE N/A 07/15/2020   Procedure: DILATATION & CURETTAGE/HYSTEROSCOPY Carollee Leitz USING MYOSURE;  Surgeon: Gerald Leitz, MD;  Location: [facility];  Service: Gynecology;  Laterality: N/A;   FOOT SURGERY     plantar fashiectomies from both feet   HERNIA REPAIR     HYSTEROSCOPY WITH D & C N/A 12/27/2017   Procedure: DILATATION AND CURETTAGE /HYSTEROSCOPY;  Surgeon: Myna Hidalgo, DO;  Location: [facility];  Service: Gynecology;  Laterality: N/A;   NASAL SEPTUM SURGERY     neuromas removed from right foot     osteoma  1991   removal from right orbital   POLYPECTOMY  12/27/2017   Procedure: POLYPECTOMY;  Surgeon: Myna Hidalgo, DO;  Location: [facility];  Service: Gynecology;;   removal of basal cell carcinoma (nose)     TONSILECTOMY/ADENOIDECTOMY WITH MYRINGOTOMY     TONSILLECTOMY     Patient Active Problem List   Diagnosis Date Noted   Seasonal allergic rhinitis due to pollen 11/03/2022   Perennial allergic rhinitis 11/03/2022   Gastroesophageal reflux disease 11/03/2022   Hymenoptera allergy 11/03/2022   Cholecystitis 02/19/2021   CAD in native artery 01/15/2019   Pure hypercholesterolemia 10/10/2018   Genetic testing 12/10/2014   Breast cancer of upper-inner quadrant  of right female breast (HCC) 11/13/2014   Allergic rhinoconjunctivitis 10/22/2014   Asthma, well controlled 10/22/2014   History of basal cell cancer 03/21/2014   Visit for wound check 07/29/2013   Visit for suture removal 07/11/2013   Basal cell carcinoma of nasal tip 07/04/2013   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  Rationale for Evaluation and Treatment: Rehabilitation  ONSET DATE: chronic, greater than 10 years  SUBJECTIVE:   SUBJECTIVE STATEMENT:  Pt states she felt like last session helped a bit. Feels that the needling had helped as well. Feels it most in her L foot only.  PERTINENT HISTORY: EVAL:Reports plantar fasciitis for years, had release surgery on one foot and extracorporal shock therapy on another in 2013.  Did undergo skilled PT a year ago and still utilizes the exercises provided at that encounter , primarily to maintain her ankle flexibility B . Was referred to PT by her podiatrist to address her foot pain, specifically requested trial of TPDN. PAIN:  Are you having pain? Yes: NPRS scale: 0 to 5/10 Pain location: B plantar fascia and heel, plantar surface Pain description: deep sharp pain,with some radiating numbness into forefeet Aggravating factors: prolonged walking, especially walking on uneven surfaces, also getting in and out of husband's truck Relieving factors: ice, rolling frozen drink bottle under arch  PRECAUTIONS: None  RED FLAGS: None   WEIGHT BEARING RESTRICTIONS: No  FALLS:  Has patient fallen in last 6 months? No  LIVING ENVIRONMENT: Lives with: lives with their spouse Lives in: House/apartment Stairs: one flight in house Has following equipment at home: None  OCCUPATION: retired Runner, broadcasting/film/video  PLOF: Independent  PATIENT GOALS: improve pain feet, manage pain better, maintain fitness without foot pain  NEXT MD VISIT: unknown  OBJECTIVE:  Note: Objective measures were completed at Evaluation unless otherwise noted.  DIAGNOSTIC FINDINGS: radiology R foot with recent 5th MT head fx   PATIENT SURVEYS:  FAAM 66/84  COGNITION: Overall cognitive status: Within functional limits for tasks  assessed     SENSATION: WFL L lateral lower leg with tingling per pt   EDEMA:  None noted  POSTURE: observation of B feet in standing with no navicular drop, rigid, high B arches  PALPATION: Tender, with thickened plantar fascial central B arch.  LOWER EXTREMITY ROM:B ankle dorsiflexion 15 degrees, all other ROM wnl  LOWER EXTREMITY MMT:  MMT Right eval Left eval  Hip flexion 4+/5 4/5  Hip extension    Hip abduction 4/5 4/5  Hip adduction    Hip internal rotation    Hip external rotation 4/5 4-/5  Knee flexion    Knee extension    Ankle dorsiflexion    Ankle plantarflexion    Ankle inversion    Ankle eversion     (Blank rows = wfl) Functional strength testing:  able to complete 15 reps unilateral heel raise each foot  Forward heel taps from 2" step, able to complete 15 with R LE, L 9 , both with increased sway, poor control Lock bridge, wnl B gluts  FUNCTIONAL TESTS:  Unilateral standing L 13 sec, R 30 sec, noted much trunk  sway each leg  GAIT: Distance walked: in clinic Assistive device utilized: None Level of assistance: Complete Independence Comments: mild decreased stance time R, tends to avoid toe off R in terminal stance   TODAY'S TREATMENT:                                                                                                                              DATE: 01/05/23 Therex Nustep L5 x 5 min LEs only Slant board gastroc stretch 2x 30" Slant board soleus stretch 2x 30" Towel scrunch 2x10 Toe abd 2x10 Arch lift 2x10  Manual Therapy STM & TPR gastroc/soleus Talocrural distraction, mobs for DF Skilled assessment and palpation for TPDN Trigger Point Dry-Needling  Treatment instructions: Expect mild to moderate muscle soreness. S/S of pneumothorax if dry needled over a lung field, and to seek immediate medical attention should they occur. Patient verbalized understanding of these instructions and education.  Patient Consent Given:  Yes Education handout provided: Previously provided Muscles treated: L glastrocs med and lat, L soleus Electrical stimulation performed: No Parameters: N/A Treatment response/outcome: Decreased muscle tension   01/03/23 TherEx Nustep L3x8 minutes BLEs for w/u and tissue perfusion  Sidelying hip ABD x10 B 0#  Manual Forefoot inversion/eversion, great toe/MTP movs, calcaneal mobs, ankle dorsiflexion mobs, PF stretching, gastroc stretching  B   Selfcare Lots of discussion about shoes and orthotics, good shoe fit, wearing schedules if they are making her feet hurt, 500 mile shoe wear limit, role of general deconditioning and weakness on gait pattern and possible increased plantar fascia pain    12/21/22: Prone for brief myfascial release B plantarflexors  Manual: Trigger Point Dry-Needling  Treatment instructions: Expect mild to moderate muscle soreness. S/S of pneumothorax if dry needled over a lung field, and to seek immediate medical attention should they occur. Patient verbalized understanding of these instructions and education. Patient Consent Given: Yes Education handout provided: No Muscles treated: B glastrocs med and lat, B soleus Treatment response/outcome: Twitch Response Elicited and Palpable Increase in Muscle Length    Jt mobs, A /P mid foot, cuneiforms , navicular, gr 2, with cardboard roll under MT heads , to increase mid foot mobility Manual distraction each foot tarsal s from mid foot  Therex: Added the following: -Seated  medial /lateral in/ev stretch with foot on larger inflated ball, starting rearfoot, and progressing to forefoot, then with tennis ball -seated IN/EV stretch foot on smooth side of 1/2 foam roller -seated hip ER strengthening with green t band around thighs  12/19/22: eval    PATIENT EDUCATION:  Education details: education in appropriate foot wear, also POC, goals Person educated: Patient Education method: Explanation, Demonstration, and  Tactile cues Education comprehension: verbalized understanding, returned demonstration, and verbal cues required  HOME EXERCISE PROGRAM: TBD  ASSESSMENT:  CLINICAL IMPRESSION: Very weak foot intrinsics. Worked on arch lifting for post tib strengthening as well as toe abduction. Reviewed hip abductor exercises.   OBJECTIVE IMPAIRMENTS: decreased  activity tolerance, decreased balance, decreased endurance, decreased strength, hypomobility, increased fascial restrictions, postural dysfunction, and pain.     GOALS: Goals reviewed with patient? Yes  SHORT TERM GOALS: Target date: 2 weeks 01/02/23 I HEP  Baseline: Goal status: MET 01/03/23  LONG TERM GOALS: Target date: 01/30/23, 6 weeks   FAAM 78/84 from 66/84 Baseline:  Goal status: INITIAL  2.  Able to unilaterally balance on each foot greater than 30 sec without sway Baseline: L 9 sec, R 30 sec Goal status: INITIAL  3.  Able to complete 15 forward heel taps from 4" step without UE support, demonstrating improved eccentric control for uneven terrain Baseline:  Goal status: INITIAL  4.  B hip Er strength 5/5 Baseline: 4-/5 Goal status: INITIAL    PLAN:  PT FREQUENCY: 2x/week  PT DURATION: 6 weeks  PLANNED INTERVENTIONS: 97110-Therapeutic exercises, 97530- Therapeutic activity, 97112- Neuromuscular re-education, 97535- Self Care, 62130- Manual therapy, and Dry Needling  PLAN FOR NEXT SESSION: instruct in lateral hip strengthening, ? TPDN, jt mobs mid foot B Balance training B, consider re-trying K-tape and DN?   Vici Novick April Ma L Railynn Ballo, PT, DPT 01/05/23 2:20 PM

## 2023-01-09 ENCOUNTER — Ambulatory Visit
Admission: RE | Admit: 2023-01-09 | Discharge: 2023-01-09 | Disposition: A | Discharge status: Home / Self Care | Payer: Medicare Other | Source: Ambulatory Visit | Attending: Family Medicine

## 2023-01-09 DIAGNOSIS — Z1231 Encounter for screening mammogram for malignant neoplasm of breast: Secondary | ICD-10-CM

## 2023-01-11 ENCOUNTER — Other Ambulatory Visit: Payer: Self-pay

## 2023-01-11 ENCOUNTER — Ambulatory Visit: Payer: Medicare Other

## 2023-01-11 DIAGNOSIS — R262 Difficulty in walking, not elsewhere classified: Secondary | ICD-10-CM | POA: Diagnosis not present

## 2023-01-11 DIAGNOSIS — M25571 Pain in right ankle and joints of right foot: Secondary | ICD-10-CM | POA: Diagnosis not present

## 2023-01-11 DIAGNOSIS — M722 Plantar fascial fibromatosis: Secondary | ICD-10-CM | POA: Diagnosis not present

## 2023-01-11 DIAGNOSIS — M25572 Pain in left ankle and joints of left foot: Secondary | ICD-10-CM

## 2023-01-11 NOTE — Therapy (Signed)
OUTPATIENT PHYSICAL THERAPY LOWER EXTREMITY TREATMENT   Patient Name: Valerie Snyder MRN: [id number] DOB:03-Jun-1950, 72 y.o., female Today's Date: 01/11/2023  END OF SESSION:  PT End of Session - 01/11/23 1433     Visit Number 5    Date for PT Re-Evaluation 01/30/23    Progress Note Due on Visit 10    PT Start Time 1430    PT Stop Time 1515    PT Time Calculation (min) 45 min    Activity Tolerance Patient tolerated treatment well    Behavior During Therapy [facility] for tasks assessed/performed               Past Medical History:  Diagnosis Date   Abnormal uterine bleeding (AUB)    Allergy    Asthma    allergy related   Breast cancer of 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  72nd percentile 11/2018.   Complication of anesthesia    10 years ago- pt states she stopped breathing and "had to be bagged"   Concussion 2018   GERD (gastroesophageal reflux disease)    H/O bilateral inguinal hernia repair 1992   Hypercholesteremia    Hypothyroidism    Migraines    Personal history of radiation therapy    Plantar fasciitis, bilateral    Pure hypercholesterolemia 10/10/2018   Radiation 01/12/15-02/04/15   right breast 42.17 gray   Spinal stenosis    Thyroid disease    Past Surgical History:  Procedure Laterality Date   BLADDER SUSPENSION     BLEPHAROPLASTY Bilateral    BREAST BIOPSY Left 01/28/2022   MM LT BREAST BX W LOC DEV 1ST LESION IMAGE BX SPEC STEREO GUIDE 01/28/2022 [facility]   BREAST LUMPECTOMY Right 12/10/2014   BREAST LUMPECTOMY WITH RADIOACTIVE SEED LOCALIZATION Right 12/10/2014   Procedure: RIGHT BREAST LUMPECTOMY WITH RADIOACTIVE SEED LOCALIZATION;  Surgeon: Chevis Pretty III, MD;  Location: [facility];  Service: General;  Laterality: Right;   CHOLECYSTECTOMY N/A 02/20/2021   Procedure: LAPAROSCOPIC CHOLECYSTECTOMY;   Surgeon: Axel Filler, MD;  Location: [facility];  Service: General;  Laterality: N/A;   DILATATION & CURETTAGE/HYSTEROSCOPY WITH MYOSURE N/A 07/15/2020   Procedure: DILATATION & CURETTAGE/HYSTEROSCOPY Carollee Leitz USING MYOSURE;  Surgeon: Gerald Leitz, MD;  Location: [facility];  Service: Gynecology;  Laterality: N/A;   FOOT SURGERY     plantar fashiectomies from both feet   HERNIA REPAIR     HYSTEROSCOPY WITH D & C N/A 12/27/2017   Procedure: DILATATION AND CURETTAGE /HYSTEROSCOPY;  Surgeon: Myna Hidalgo, DO;  Location: [facility];  Service: Gynecology;  Laterality: N/A;   NASAL SEPTUM SURGERY     neuromas removed from right foot     osteoma  1991   removal from right orbital   POLYPECTOMY  12/27/2017   Procedure: POLYPECTOMY;  Surgeon: Myna Hidalgo, DO;  Location: [facility];  Service: Gynecology;;   removal of basal cell carcinoma (nose)     TONSILECTOMY/ADENOIDECTOMY WITH MYRINGOTOMY     TONSILLECTOMY     Patient Active Problem List   Diagnosis Date Noted   Seasonal allergic rhinitis due to pollen 11/03/2022   Perennial allergic rhinitis 11/03/2022   Gastroesophageal reflux disease 11/03/2022   Hymenoptera allergy 11/03/2022   Cholecystitis 02/19/2021   CAD in native artery 01/15/2019   Pure hypercholesterolemia 10/10/2018   Genetic testing 12/10/2014   Breast cancer of upper-inner  quadrant of right female breast (HCC) 11/13/2014   Allergic rhinoconjunctivitis 10/22/2014   Asthma, well controlled 10/22/2014   History of basal cell cancer 03/21/2014   Visit for wound check 07/29/2013   Visit for suture removal 07/11/2013   Basal cell carcinoma of nasal tip 07/04/2013   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   Neoplasm of uncertain behavior of skin 06/19/2013   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   Lentigo 12/02/2011   Melanocytic nevi of trunk 12/02/2011   SK (seborrheic keratosis) 12/02/2011   BCC (basal cell carcinoma) 01/29/1898     PCP: Jackelyn Poling, DO  REFERRING PROVIDER: Ovid Curd, DPM  REFERRING DIAG: B plantarfasciitis  THERAPY DIAG:  Plantar fasciitis, bilateral  Difficulty in walking, not elsewhere classified  Pain in left ankle and joints of left foot  Pain in right ankle and joints of right foot  Rationale for Evaluation and Treatment: Rehabilitation  ONSET DATE: chronic, greater than 10 years  SUBJECTIVE:   SUBJECTIVE STATEMENT:  Pt states she felt like last session helped a bit. Feels that the needling had helped as well. Feels it most in her L foot only.  PERTINENT HISTORY: EVAL:Reports plantar fasciitis for years, had release surgery on one foot and extracorporal shock therapy on another in 2013.  Did undergo skilled PT a year ago and still utilizes the exercises provided at that encounter , primarily to maintain her ankle flexibility B . Was referred to PT by her podiatrist to address her foot pain, specifically requested trial of TPDN. PAIN:  Are you having pain? Yes: NPRS scale: 0 to 5/10 Pain location: B plantar fascia and heel, plantar surface Pain description: deep sharp pain,with some radiating numbness into forefeet Aggravating factors: prolonged walking, especially walking on uneven surfaces, also getting in and out of husband's truck Relieving factors: ice, rolling frozen drink bottle under arch  PRECAUTIONS: None  RED FLAGS: None   WEIGHT BEARING RESTRICTIONS: No  FALLS:  Has patient fallen in last 6 months? No  LIVING ENVIRONMENT: Lives with: lives with their spouse Lives in: House/apartment Stairs: one flight in house Has following equipment at home: None  OCCUPATION: retired Runner, broadcasting/film/video  PLOF: Independent  PATIENT GOALS: improve pain feet, manage pain better, maintain fitness without foot pain  NEXT MD VISIT: unknown  OBJECTIVE:  Note: Objective measures were completed at Evaluation unless otherwise noted.  DIAGNOSTIC FINDINGS: radiology R foot  with recent 5th MT head fx   PATIENT SURVEYS:  FAAM 66/84  COGNITION: Overall cognitive status: Within functional limits for tasks assessed     SENSATION: WFL L lateral lower leg with tingling per pt   EDEMA:  None noted  POSTURE: observation of B feet in standing with no navicular drop, rigid, high B arches  PALPATION: Tender, with thickened plantar fascial central B arch.  LOWER EXTREMITY ROM:B ankle dorsiflexion 15 degrees, all other ROM wnl  LOWER EXTREMITY MMT:  MMT Right eval Left eval  Hip flexion 4+/5 4/5  Hip extension    Hip abduction 4/5 4/5  Hip adduction    Hip internal rotation    Hip external rotation 4/5 4-/5  Knee flexion    Knee extension    Ankle dorsiflexion    Ankle plantarflexion    Ankle inversion    Ankle eversion     (Blank rows = wfl) Functional strength testing:  able to complete 15 reps unilateral heel raise each foot  Forward heel taps from 2" step, able to complete 15 with R LE, L  9 , both with increased sway, poor control Lock bridge, wnl B gluts  FUNCTIONAL TESTS:  Unilateral standing L 13 sec, R 30 sec, noted much trunk sway each leg  GAIT: Distance walked: in clinic Assistive device utilized: None Level of assistance: Complete Independence Comments: mild decreased stance time R, tends to avoid toe off R in terminal stance   TODAY'S TREATMENT:                                                                                                                              DATE: 01/11/23: Therex: Nustep level 5 x 8 min Seated inv/ ev stretch with arch on pool noodle, noodle at 45 degree angle , in sitting, cues to keep the noodle in midfoot area Prone for kinesiotaping L arch/ heel,using the tape to bunch up her fat pad under heel and increase cushioning.  Did show her examples of various ankle/ arch support compression socks to replicate the taping today.  We reviewed her activities, recommended aquatic exercise due to her  complaints overall of stiffness legs, lower back   01/05/23 Therex Nustep L5 x 5 min LEs only Slant board gastroc stretch 2x 30" Slant board soleus stretch 2x 30" Towel scrunch 2x10 Toe abd 2x10 Arch lift 2x10  Manual Therapy STM & TPR gastroc/soleus Talocrural distraction, mobs for DF Skilled assessment and palpation for TPDN Trigger Point Dry-Needling  Treatment instructions: Expect mild to moderate muscle soreness. S/S of pneumothorax if dry needled over a lung field, and to seek immediate medical attention should they occur. Patient verbalized understanding of these instructions and education.  Patient Consent Given: Yes Education handout provided: Previously provided Muscles treated: L glastrocs med and lat, L soleus Electrical stimulation performed: No Parameters: N/A Treatment response/outcome: Decreased muscle tension   01/03/23 TherEx Nustep L3x8 minutes BLEs for w/u and tissue perfusion  Sidelying hip ABD x10 B 0#  Manual Forefoot inversion/eversion, great toe/MTP movs, calcaneal mobs, ankle dorsiflexion mobs, PF stretching, gastroc stretching  B   Selfcare Lots of discussion about shoes and orthotics, good shoe fit, wearing schedules if they are making her feet hurt, 500 mile shoe wear limit, role of general deconditioning and weakness on gait pattern and possible increased plantar fascia pain    12/21/22: Prone for brief myfascial release B plantarflexors  Manual: Trigger Point Dry-Needling  Treatment instructions: Expect mild to moderate muscle soreness. S/S of pneumothorax if dry needled over a lung field, and to seek immediate medical attention should they occur. Patient verbalized understanding of these instructions and education. Patient Consent Given: Yes Education handout provided: No Muscles treated: B glastrocs med and lat, B soleus Treatment response/outcome: Twitch Response Elicited and Palpable Increase in Muscle Length    Jt mobs, A /P mid  foot, cuneiforms , navicular, gr 2, with cardboard roll under MT heads , to increase mid foot mobility Manual distraction each foot tarsal s from mid foot  Therex: Added the following: -Seated  medial /lateral in/ev stretch with foot on larger inflated ball, starting rearfoot, and progressing to forefoot, then with tennis ball -seated IN/EV stretch foot on smooth side of 1/2 foam roller -seated hip ER strengthening with green t band around thighs  12/19/22: eval    PATIENT EDUCATION:  Education details: education in appropriate foot wear, also POC, goals Person educated: Patient Education method: Explanation, Demonstration, and Tactile cues Education comprehension: verbalized understanding, returned demonstration, and verbal cues required  HOME EXERCISE PROGRAM: TBD  ASSESSMENT:  CLINICAL IMPRESSION: In discussion today the pt indicates that she has a good knowledge base to draw from for her therex and management of B foot pain.  Did add specific INV/ EV stretching with noodle under arch to improve mid and rearfoot mobility.  Also made other recommendations regarding a sock/arch support.  We will DC PT at this time per pt request, did not perform her balance outcome tests to assess goals as she determined to dc at end of today;s session.  OBJECTIVE IMPAIRMENTS: decreased activity tolerance, decreased balance, decreased endurance, decreased strength, hypomobility, increased fascial restrictions, postural dysfunction, and pain.     GOALS: Goals reviewed with patient? Yes  SHORT TERM GOALS: Target date: 2 weeks 01/02/23 I HEP  Baseline: Goal status: MET 01/03/23  LONG TERM GOALS: Target date: 01/30/23, 6 weeks   FAAM 78/84 from 66/84 Baseline:  Goal status: FAAM Activities of Daily Living Subscale: 53 / 84 or 63 %, 01/11/23 declined since initial eval  2.  Able to unilaterally balance on each foot greater than 30 sec without sway Baseline: L 9 sec, R 30 sec Goal status:  INITIAL  3.  Able to complete 15 forward heel taps from 4" step without UE support, demonstrating improved eccentric control for uneven terrain Baseline:  Goal status: INITIAL  4.  B hip Er strength 5/5 Baseline: 4-/5 Goal status: INITIAL    PLAN:  PT FREQUENCY: 2x/week  PT DURATION: 6 weeks  PLANNED INTERVENTIONS: 97110-Therapeutic exercises, 97530- Therapeutic activity, 97112- Neuromuscular re-education, 97535- Self Care, 16109- Manual therapy, and Dry Needling  PLAN FOR NEXT SESSION: DC Nyemah Watton L Eshawn Coor, PT, DPT, OCS 01/11/23 2:34 PM

## 2023-01-13 DIAGNOSIS — Z23 Encounter for immunization: Secondary | ICD-10-CM | POA: Diagnosis not present

## 2023-01-16 IMAGING — CT CT CHEST W/ CM
2 of 4 series · 15 of 36 positions shown, 18 images · IV contrast (omnipaque)
Comparison: Chest CT 01/22/2020.

CLINICAL DATA: 70-year-old female with history of breast cancer.
Follow-up study.

EXAM:
CT CHEST WITH CONTRAST
TECHNIQUE: Multidetector CT imaging of the chest was performed during
intravenous contrast administration.
CONTRAST:  60mL OMNIPAQUE IOHEXOL 350 MG/ML SOLN

[Series 2: axial st · axial · 0.72mm/px · z∈[-274,-36]mm · 12 of 141 slices shown, 15 images]
[im 11/141  mediastinal]
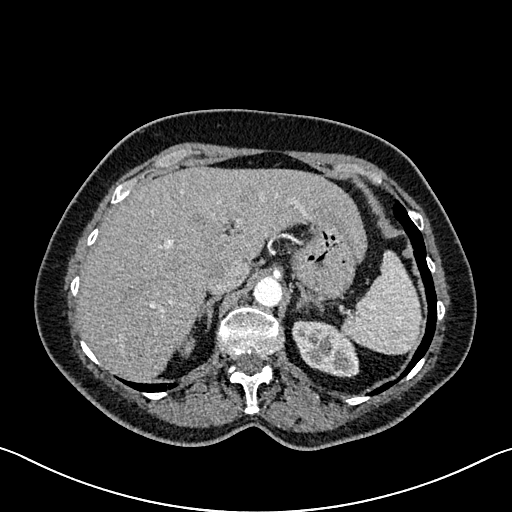
[im 11/141  lung]
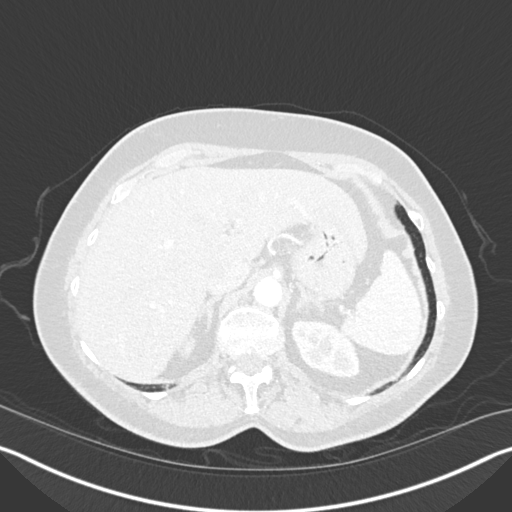
[im 22/141  lung]
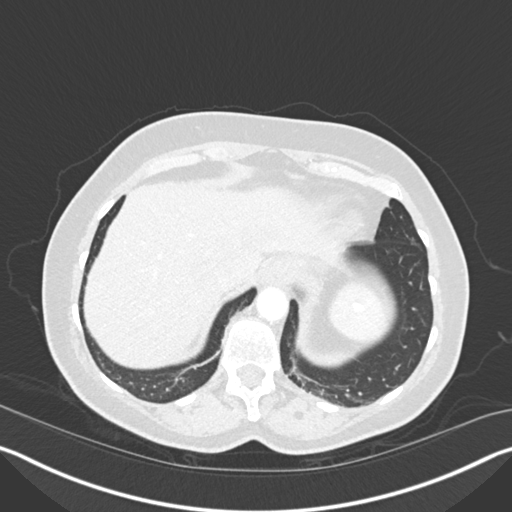
[im 33/141  lung]
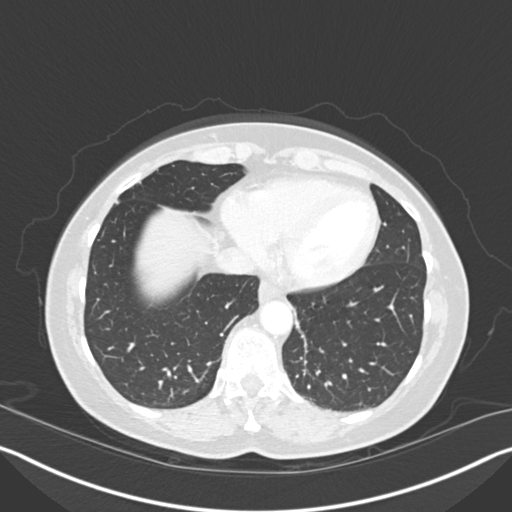
[im 44/141  lung]
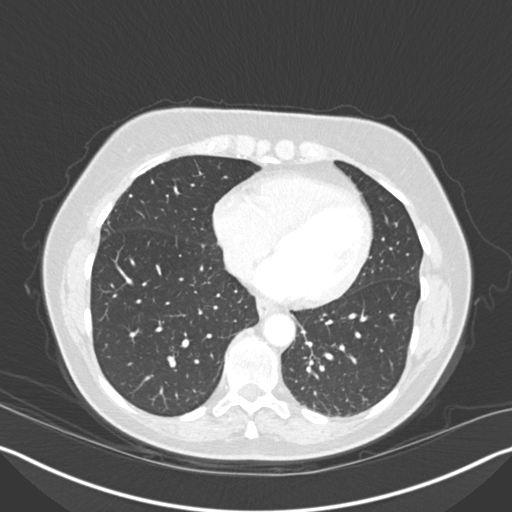
[im 54/141  mediastinal]
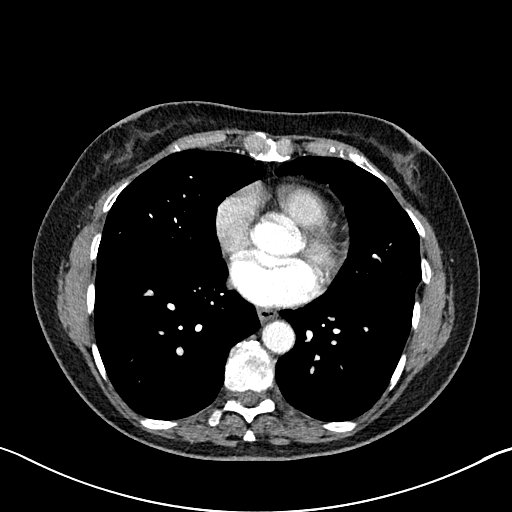
[im 54/141  lung]
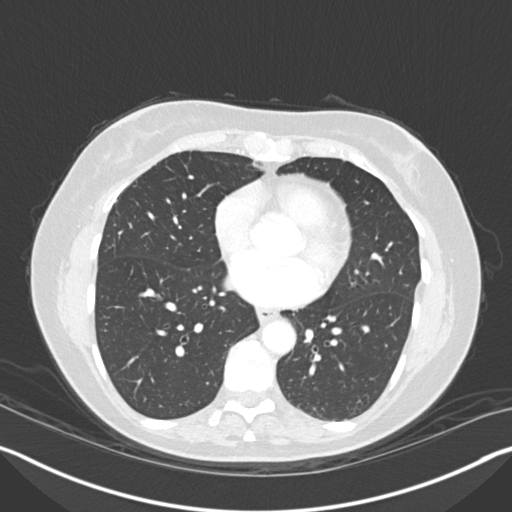
[im 65/141  lung]
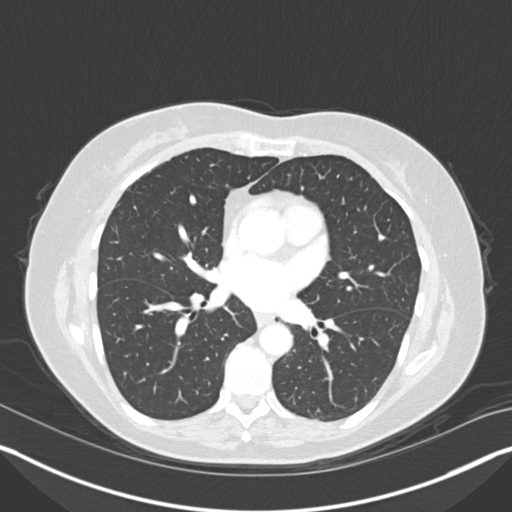
[im 76/141  lung]
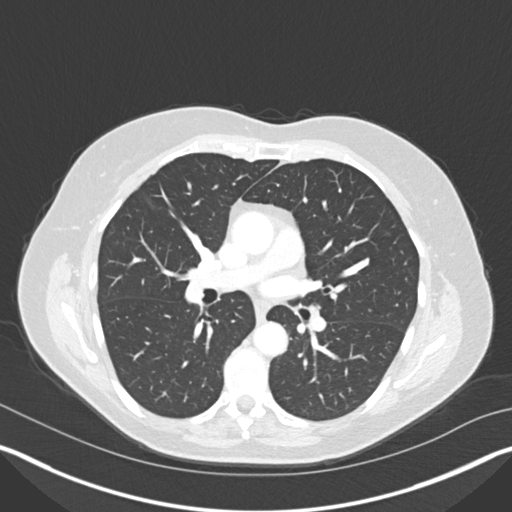
[im 87/141  lung]
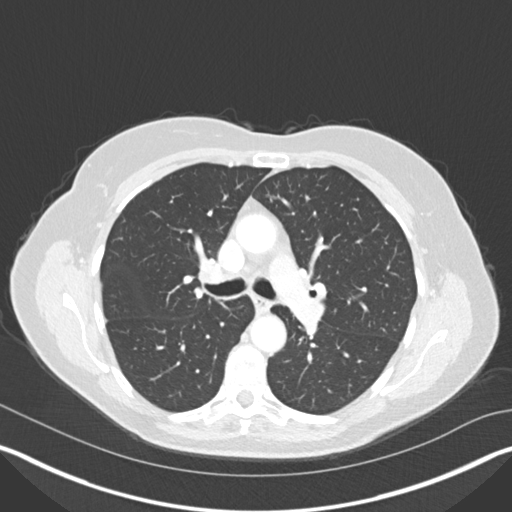
[im 97/141  mediastinal]
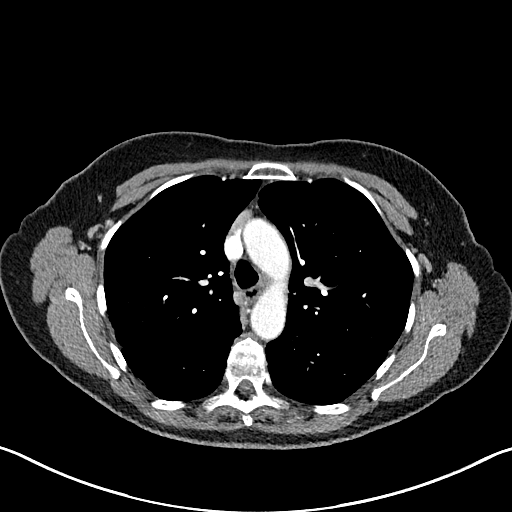
[im 97/141  lung]
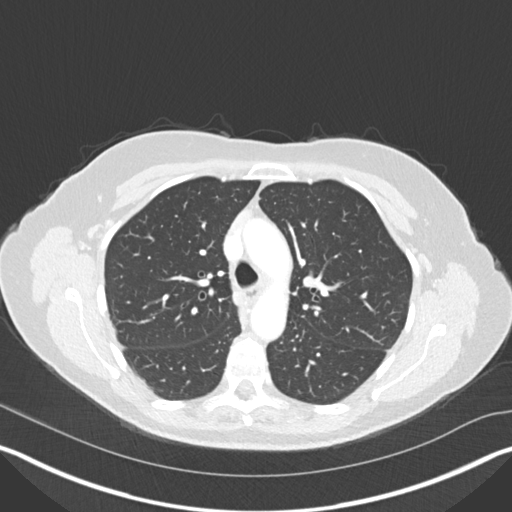
[im 108/141  lung]
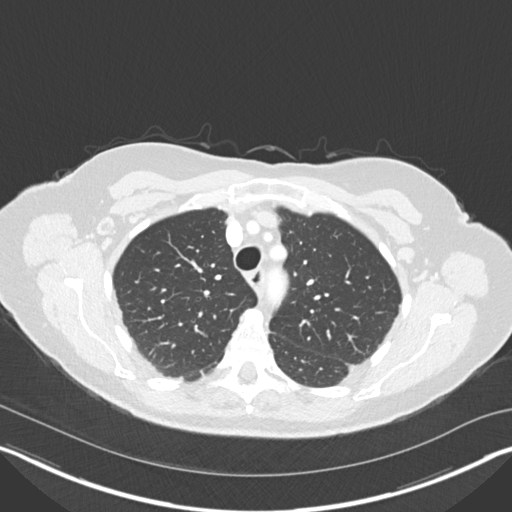
[im 119/141  lung]
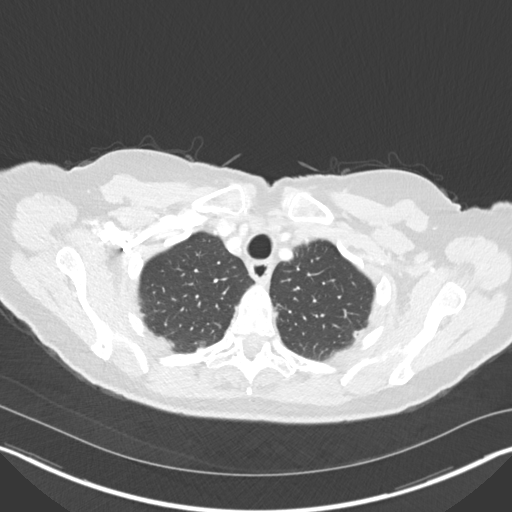
[im 130/141  lung]
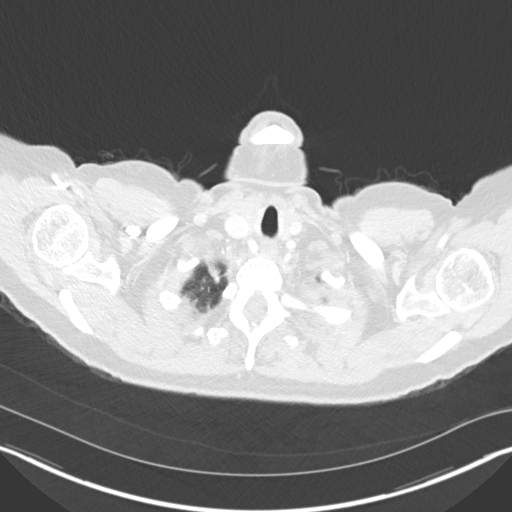

[Series 4: coronal · coronal · 0.62mm/px · 3 of 123 slices shown]
[im 25/123  lung]
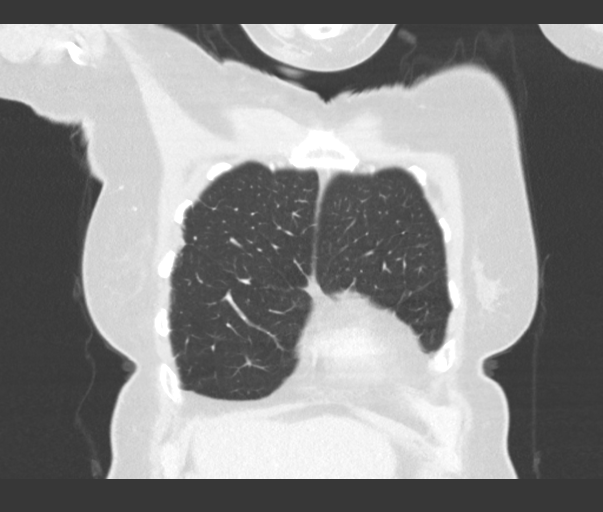
[im 49/123  lung]
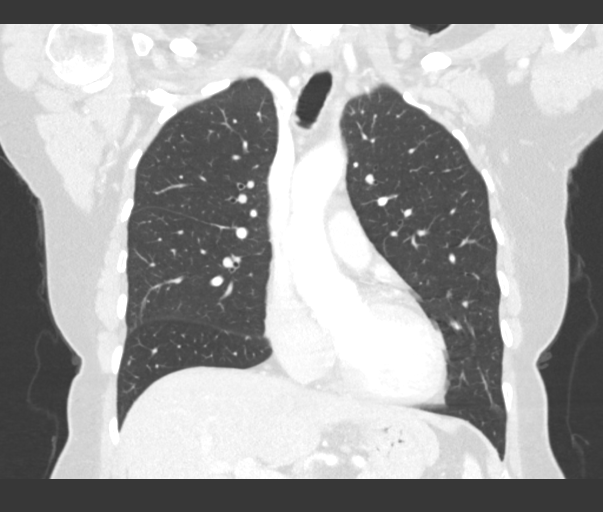
[im 74/123  lung]
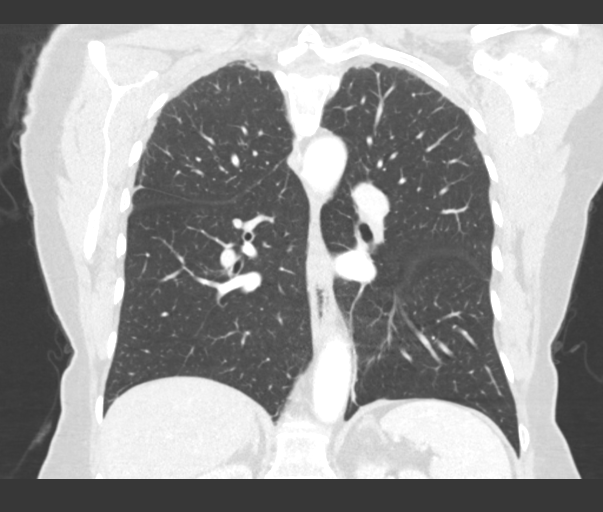

[15 of 36 positions shown; findings below may reference images not displayed]

FINDINGS: Cardiovascular: Heart size is normal. There is no significant
pericardial fluid, thickening or pericardial calcification. There is
aortic atherosclerosis, as well as atherosclerosis of the great
vessels of the mediastinum and the coronary arteries, including
calcified atherosclerotic plaque in the left main, left anterior
descending and left circumflex coronary arteries.

Mediastinum/Nodes: No pathologically enlarged mediastinal, internal
mammary or hilar lymph nodes. Esophagus is unremarkable in
appearance. No axillary lymphadenopathy.

Lungs/Pleura: 3 mm left upper lobe pulmonary nodule (axial image 52
of series 7), stable, considered benign. No other larger more
suspicious appearing pulmonary nodules or masses are noted. No acute
consolidative airspace disease. No pleural effusions. Areas of mild
chronic linear scarring are noted in the medial aspects of the lower
lobes of the lungs bilaterally.

Upper Abdomen: Calcified granuloma in the spleen incidentally noted.
Diffuse low attenuation throughout the visualized hepatic
parenchyma, suggestive of hepatic steatosis (difficult to say for
certain on today's contrast enhanced examination).

Musculoskeletal: There are no aggressive appearing lytic or blastic
lesions noted in the visualized portions of the skeleton.
IMPRESSION: 1. No definite evidence of metastatic disease in the thorax.
2. Previously noted left upper lobe 3 mm pulmonary nodule is stable
and considered benign at this time.
3. Aortic atherosclerosis, in addition to left main and 2 vessel
coronary artery disease. Assessment for potential risk factor
modification, dietary therapy or pharmacologic therapy may be
warranted, if clinically indicated.
4. Probable mild hepatic steatosis.

Aortic Atherosclerosis ([id number]).

## 2023-01-26 DIAGNOSIS — K219 Gastro-esophageal reflux disease without esophagitis: Secondary | ICD-10-CM | POA: Diagnosis not present

## 2023-01-26 DIAGNOSIS — Z8719 Personal history of other diseases of the digestive system: Secondary | ICD-10-CM | POA: Diagnosis not present

## 2023-01-27 ENCOUNTER — Other Ambulatory Visit (HOSPITAL_COMMUNITY): Payer: Self-pay

## 2023-01-27 ENCOUNTER — Telehealth: Payer: Self-pay | Admitting: Pharmacist Clinician (PhC)/ Clinical Pharmacy Specialist

## 2023-01-27 NOTE — Telephone Encounter (Signed)
Patient called, was unable to pick up Praluent rx at pharmacy, told not on plan.  She is not changing plans for 2025.  Will check with prior auth team to see if her PA is expired or if this was a refill to soon at the pharmacy.   She will also need to have her [employer] grant renewed, will do this once we get updated prior auth.

## 2023-01-31 ENCOUNTER — Ambulatory Visit: Payer: Medicare Other | Admitting: Physical Therapy

## 2023-02-02 DIAGNOSIS — Z79899 Other long term (current) drug therapy: Secondary | ICD-10-CM | POA: Diagnosis not present

## 2023-02-02 NOTE — Therapy (Signed)
 OUTPATIENT PHYSICAL THERAPY LOWER EXTREMITY TREATMENT   Patient Name: Valerie Snyder MRN: [id number] DOB:1950-02-19, 73 y.o., female Today's Date: 02/03/2023  END OF SESSION:  PT End of Session - 02/03/23 1056     Visit Number 6    Date for PT Re-Evaluation 01/30/23    Progress Note Due on Visit 10    PT Start Time 1055    PT Stop Time 1115    PT Time Calculation (min) 20 min    Activity Tolerance Patient tolerated treatment well    Behavior During Therapy [facility] for tasks assessed/performed                Past Medical History:  Diagnosis Date   Abnormal uterine bleeding (AUB)    Allergy     Asthma    allergy  related   Breast cancer of 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  72nd percentile 11/2018.   Complication of anesthesia    10 years ago- pt states she stopped breathing and had to be bagged   Concussion 2018   GERD (gastroesophageal reflux disease)    H/O bilateral inguinal hernia repair 1992   Hypercholesteremia    Hypothyroidism    Migraines    Personal history of radiation therapy    Plantar fasciitis, bilateral    Pure hypercholesterolemia 10/10/2018   Radiation 01/12/15-02/04/15   right breast 42.17 gray   Spinal stenosis    Thyroid  disease    Past Surgical History:  Procedure Laterality Date   BLADDER SUSPENSION     BLEPHAROPLASTY Bilateral    BREAST BIOPSY Left 01/28/2022   MM LT BREAST BX W LOC DEV 1ST LESION IMAGE BX SPEC STEREO GUIDE 01/28/2022 [facility]GRAP[facility]   BREAST LUMPECTOMY Right 12/10/2014   BREAST LUMPECTOMY WITH RADIOACTIVE SEED LOCALIZATION Right 12/10/2014   Procedure: RIGHT BREAST LUMPECTOMY WITH RADIOACTIVE SEED LOCALIZATION;  Surgeon: Deward Null III, MD;  Location: [facility];  Service: General;  Laterality: Right;   CHOLECYSTECTOMY N/A 02/20/2021   Procedure: LAPAROSCOPIC CHOLECYSTECTOMY;   Surgeon: Rubin Calamity, MD;  Location: [facility] [place] [facility];  Service: General;  Laterality: N/A;   DILATATION & CURETTAGE/HYSTEROSCOPY WITH MYOSURE N/A 07/15/2020   Procedure: DILATATION & CURETTAGE/HYSTEROSCOPY WITHPOLYPECTOMY USING MYOSURE;  Surgeon: Rosalva Sawyer, MD;  Location: [facility];  Service: Gynecology;  Laterality: N/A;   FOOT SURGERY     plantar fashiectomies from both feet   HERNIA REPAIR     HYSTEROSCOPY WITH D & C N/A 12/27/2017   Procedure: DILATATION AND CURETTAGE /HYSTEROSCOPY;  Surgeon: Marilynn Nest, DO;  Location: [facility];  Service: Gynecology;  Laterality: N/A;   NASAL SEPTUM SURGERY     neuromas removed from right foot     osteoma  1991   removal from right orbital   POLYPECTOMY  12/27/2017   Procedure: POLYPECTOMY;  Surgeon: Marilynn Nest, DO;  Location: [facility];  Service: Gynecology;;   removal of basal cell carcinoma (nose)     TONSILECTOMY/ADENOIDECTOMY WITH MYRINGOTOMY     TONSILLECTOMY     Patient Active Problem List   Diagnosis Date Noted   Seasonal allergic rhinitis due to pollen 11/03/2022   Perennial allergic rhinitis 11/03/2022   Gastroesophageal reflux disease 11/03/2022   Hymenoptera allergy  11/03/2022   Cholecystitis 02/19/2021   CAD in native artery 01/15/2019   Pure hypercholesterolemia 10/10/2018   Genetic testing 12/10/2014   Breast cancer of  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 breast (HCC) 11/13/2014   Allergic rhinoconjunctivitis 10/22/2014   Asthma, well controlled 10/22/2014   History of basal cell cancer 03/21/2014   Visit for wound check 07/29/2013   Visit for suture removal 07/11/2013   Basal cell carcinoma of nasal tip 07/04/2013   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 I thought it was being discharged too. I was confused and felt bad so I came in.   PERTINENT HISTORY: EVAL:Reports plantar fasciitis for years, had release surgery on one foot and extracorporal shock therapy on another in 2013.  Did undergo skilled PT a year ago and still utilizes the exercises provided at that encounter , primarily to maintain her ankle flexibility B . Was referred to PT by her podiatrist to address her foot pain, specifically requested trial of TPDN. PAIN:  Are you having pain? Yes: NPRS scale: 0 to 5/10 Pain location: B plantar fascia and heel, plantar surface Pain description: deep sharp pain,with some radiating numbness into forefeet Aggravating factors: prolonged walking, especially walking on uneven surfaces, also getting in and out of husband's truck Relieving factors: ice, rolling frozen drink bottle under arch  PRECAUTIONS: None  RED FLAGS: None   WEIGHT BEARING RESTRICTIONS: No  FALLS:  Has patient fallen in last 6 months? No  LIVING ENVIRONMENT: Lives with: lives with their spouse Lives in: House/apartment Stairs: one flight in house Has following equipment at home: None  OCCUPATION: retired runner, broadcasting/film/video  PLOF: Independent  PATIENT GOALS: improve pain feet, manage pain better, maintain fitness without foot pain  NEXT MD VISIT: unknown  OBJECTIVE:  Note: Objective measures were completed at Evaluation unless otherwise noted.  DIAGNOSTIC FINDINGS: radiology R foot with recent 5th MT head fx   PATIENT SURVEYS:  FAAM 66/84  COGNITION: Overall cognitive status: Within functional limits for tasks  assessed     SENSATION: WFL L lateral lower leg with tingling per pt   EDEMA:  None noted  POSTURE: observation of B feet in standing with no navicular drop, rigid, high B arches  PALPATION: Tender, with thickened plantar fascial central B arch.  LOWER EXTREMITY ROM:B ankle dorsiflexion 15 degrees, all other ROM wnl  LOWER EXTREMITY MMT:  MMT Right eval Left eval  Hip flexion 4+/5 4/5  Hip extension    Hip abduction 4/5 4/5  Hip adduction    Hip internal rotation    Hip external rotation 4/5 4-/5  Knee flexion    Knee extension    Ankle dorsiflexion    Ankle plantarflexion    Ankle inversion    Ankle eversion     (Blank rows = wfl) Functional strength testing:  able to complete 15 reps unilateral heel raise each foot  Forward heel taps from 2 step, able to complete 15 with R LE, L 9 , both with increased sway, poor control Lock bridge, wnl B gluts  FUNCTIONAL TESTS:  Unilateral standing L 13 sec,  R 30 sec, noted much trunk sway each leg  GAIT: Distance walked: in clinic Assistive device utilized: None Level of assistance: Complete Independence Comments: mild decreased stance time R, tends to avoid toe off R in terminal stance   TODAY'S TREATMENT:                                                                                                                              DATE: 02/03/23 Review goals  Reviewed HEP and pain management to continue to work on at home    01/11/23: Therex: Nustep level 5 x 8 min Seated inv/ ev stretch with arch on pool noodle, noodle at 45 degree angle , in sitting, cues to keep the noodle in midfoot area Prone for kinesiotaping L arch/ heel,using the tape to bunch up her fat pad under heel and increase cushioning.  Did show her examples of various ankle/ arch support compression socks to replicate the taping today.  We reviewed her activities, recommended aquatic exercise due to her complaints overall of stiffness legs, lower  back   01/05/23 Therex Nustep L5 x 5 min LEs only Slant board gastroc stretch 2x 30 Slant board soleus stretch 2x 30 Towel scrunch 2x10 Toe abd 2x10 Arch lift 2x10  Manual Therapy STM & TPR gastroc/soleus Talocrural distraction, mobs for DF Skilled assessment and palpation for TPDN Trigger Point Dry-Needling  Treatment instructions: Expect mild to moderate muscle soreness. S/S of pneumothorax if dry needled over a lung field, and to seek immediate medical attention should they occur. Patient verbalized understanding of these instructions and education.  Patient Consent Given: Yes Education handout provided: Previously provided Muscles treated: L glastrocs med and lat, L soleus Electrical stimulation performed: No Parameters: N/A Treatment response/outcome: Decreased muscle tension   01/03/23 TherEx Nustep L3x8 minutes BLEs for w/u and tissue perfusion  Sidelying hip ABD x10 B 0#  Manual Forefoot inversion/eversion, great toe/MTP movs, calcaneal mobs, ankle dorsiflexion mobs, PF stretching, gastroc stretching  B   Selfcare Lots of discussion about shoes and orthotics, good shoe fit, wearing schedules if they are making her feet hurt, 500 mile shoe wear limit, role of general deconditioning and weakness on gait pattern and possible increased plantar fascia pain    12/21/22: Prone for brief myfascial release B plantarflexors  Manual: Trigger Point Dry-Needling  Treatment instructions: Expect mild to moderate muscle soreness. S/S of pneumothorax if dry needled over a lung field, and to seek immediate medical attention should they occur. Patient verbalized understanding of these instructions and education. Patient Consent Given: Yes Education handout provided: No Muscles treated: B glastrocs med and lat, B soleus Treatment response/outcome: Twitch Response Elicited and Palpable Increase in Muscle Length    Jt mobs, A /P mid foot, cuneiforms , navicular, gr 2, with  cardboard roll under MT heads , to increase mid foot mobility Manual distraction each foot tarsal s from mid foot  Therex: Added the following: -Seated  medial /lateral  in/ev stretch with foot on larger inflated ball, starting rearfoot, and progressing to forefoot, then with tennis ball -seated IN/EV stretch foot on smooth side of 1/2 foam roller -seated hip ER strengthening with green t band around thighs  12/19/22: eval    PATIENT EDUCATION:  Education details: education in appropriate foot wear, also POC, goals Person educated: Patient Education method: Explanation, Demonstration, and Tactile cues Education comprehension: verbalized understanding, returned demonstration, and verbal cues required  HOME EXERCISE PROGRAM: TBD  ASSESSMENT:  CLINICAL IMPRESSION: Patient returns to therapy mainly to clarify whether or not she was discharged from therapy. She feels that she has the necessary tools she needs to continue working on her pain management outside of PT at this time.   In discussion today the pt indicates that she has a good knowledge base to draw from for her therex and management of B foot pain.  We will DC PT at this time per pt request, did not perform her balance outcome tests to assess goals as she determined to dc at end of today;s session.    OBJECTIVE IMPAIRMENTS: decreased activity tolerance, decreased balance, decreased endurance, decreased strength, hypomobility, increased fascial restrictions, postural dysfunction, and pain.     GOALS: Goals reviewed with patient? Yes  SHORT TERM GOALS: Target date: 2 weeks 01/02/23 I HEP  Baseline: Goal status: MET 01/03/23  LONG TERM GOALS: Target date: 01/30/23, 6 weeks   FAAM 78/84 from 66/84 Baseline:  Goal status: FAAM Activities of Daily Living Subscale: 53 / 84 or 63 %, 01/11/23 declined since initial eval  2.  Able to unilaterally balance on each foot greater than 30 sec without sway Baseline: L 9 sec, R 30  sec Goal status: 15s on L PARTIALLY MET  3.  Able to complete 15 forward heel taps from 4 step without UE support, demonstrating improved eccentric control for uneven terrain Baseline:  Goal status: MET 02/03/23  4.  B hip Er strength 5/5 Baseline: 4-/5 Goal status: MET 02/03/23    PLAN:  PT FREQUENCY: 2x/week  PT DURATION: 6 weeks  PLANNED INTERVENTIONS: 97110-Therapeutic exercises, 97530- Therapeutic activity, [id number]- Neuromuscular re-education, 97535- Self Care, [id number]- Manual therapy, and Dry Needling  PLAN FOR NEXT SESSION: DC Almetta Fam, PT, DPT, OCS 02/03/23 11:19 AM

## 2023-02-03 ENCOUNTER — Ambulatory Visit: Payer: Medicare Other | Attending: Podiatry

## 2023-02-03 DIAGNOSIS — M722 Plantar fascial fibromatosis: Secondary | ICD-10-CM | POA: Diagnosis not present

## 2023-02-07 ENCOUNTER — Encounter (INDEPENDENT_AMBULATORY_CARE_PROVIDER_SITE_OTHER): Payer: Self-pay

## 2023-02-07 ENCOUNTER — Ambulatory Visit (INDEPENDENT_AMBULATORY_CARE_PROVIDER_SITE_OTHER): Payer: Medicare Other | Admitting: Otolaryngology

## 2023-02-07 VITALS — BP 123/79 | HR 78 | Resp 19 | Ht 64.0 in | Wt 141.0 lb

## 2023-02-07 DIAGNOSIS — R1310 Dysphagia, unspecified: Secondary | ICD-10-CM | POA: Diagnosis not present

## 2023-02-07 DIAGNOSIS — R0982 Postnasal drip: Secondary | ICD-10-CM

## 2023-02-07 NOTE — Progress Notes (Signed)
 Dear Dr. Dayna, Here is my assessment for our mutual patient, Valerie Snyder. Thank you for allowing me the opportunity to care for your patient. Please do not hesitate to contact me should you have any other questions. Sincerely, Dr. Eldora Blanch  Otolaryngology Clinic Note Referring provider: Dr. Dayna HPI:  Valerie Snyder is a 73 y.o. female kindly referred by Dr. Dayna for evaluation of dysphagia Initial visit (12/08/2022): Reports Has had more trouble swallowing recently. She reports that she seems to have trouble with solids, liquids, and even sometimes saliva --- some choking episodes. Also with some dry cough and throat clearing but no obvious triggers. She feels like when she is on the CPAP, her mucus is thicker. Also has some PND. She has had esophageal dilation x2 (few years ago most recently), and now due for Endoscopy in December 2024. It did help with her dysphagia prior. She does nasal saline spray, and flonase  as well as uses Ventolin . Water: at least 90 oz/day  No hemoptysis, no odynophagia, no ear pain, no shortness of breath, normal voice, and no neck masses. No heimlich, no overt aspiration episodes, no regurgitation, no sinusitis cardinal symptoms Never had a swallow test before it appears.   She is on homeopathic called Atrantil for GERD/Bloating. Denies GERD sx now Dr. Earlyne did prescribed Pantoprazole  - doing a course for 2 weeks, not really helping. She does see Allergy  as well for Asthma, Reflux, and season AR --------------------------------------------------- We decided to obtain an MBS and manage her conservatively; since sx appear to be related to her CPAP, increased humdification and also started ayr gel. She follows up today Follow up (02/07/2023): CPAP humidification has increased, and this has helped with thicker mucus, thinner mucus now and this has helped some of the coughing and throat clearing. Of note, her swallowing is also much better now  and she thinks maybe dryness contributed - no choking episodes. We did review her MBS. Of note, she does have an upcoming EGD with possible dilation with GI. Using saline and ventolin .  Not using flonase  --------------------------------  Personal or FHx of bleeding dz or anesthesia difficulty: no  AP/AC: no  PMHx: Allergic rhinitis, Asthma, Allergic Conjuncitivitis, GERD, OSA on CPAP, Breast cancer (treated), Basal ganglia infarct   Tobacco: no. Alcohol: no.  Independent Review of Additional Tests or Records:  Referral notes reviewed [facility] 01/2023 reviewed: noted dysphagia, improved; GERD, refil pantoprazole , EGD upcoming  EGD 12/2020: for dysphagia, gastric polyps, repeat EGD in 12/2022. CTH 2018: unable to review images, but no noted significant sinus opacification noted with attention to nasal cavity and paranasal sinuses  MRI brain 2018: independent review, no signficant paranasal sinus disease noted MBS 12/23/2022 independently reviewed: cleared for regular diet, noted normal OP swallow, no aspiration or penetration. No significant stricture noted  PMH/Meds/All/SocHx/FamHx/ROS:   Past Medical History:  Diagnosis Date   Abnormal uterine bleeding (AUB)    Allergy     Asthma    allergy  related   Breast cancer of 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  72nd percentile 11/2018.   Complication of anesthesia    10 years ago- pt states she stopped breathing and had to be bagged   Concussion 2018   GERD (gastroesophageal reflux disease)    H/O bilateral inguinal hernia repair 1992   Hypercholesteremia    Hypothyroidism    Migraines    Personal history of radiation therapy  Plantar fasciitis, bilateral    Pure hypercholesterolemia 10/10/2018   Radiation 01/12/15-02/04/15   right breast 42.17 gray   Spinal stenosis    Thyroid  disease      Past Surgical  History:  Procedure Laterality Date   BLADDER SUSPENSION     BLEPHAROPLASTY Bilateral    BREAST BIOPSY Left 01/28/2022   MM LT BREAST BX W LOC DEV 1ST LESION IMAGE BX SPEC STEREO GUIDE 01/28/2022 [facility]   BREAST LUMPECTOMY Right 12/10/2014   BREAST LUMPECTOMY WITH RADIOACTIVE SEED LOCALIZATION Right 12/10/2014   Procedure: RIGHT BREAST LUMPECTOMY WITH RADIOACTIVE SEED LOCALIZATION;  Surgeon: Deward Null III, MD;  Location: [facility];  Service: General;  Laterality: Right;   CHOLECYSTECTOMY N/A 02/20/2021   Procedure: LAPAROSCOPIC CHOLECYSTECTOMY;  Surgeon: Rubin Calamity, MD;  Location: [facility] OR;  Service: General;  Laterality: N/A;   DILATATION & CURETTAGE/HYSTEROSCOPY WITH MYOSURE N/A 07/15/2020   Procedure: DILATATION & CURETTAGE/HYSTEROSCOPY MARGARETE USING MYOSURE;  Surgeon: Rosalva Sawyer, MD;  Location: [facility];  Service: Gynecology;  Laterality: N/A;   FOOT SURGERY     plantar fashiectomies from both feet   HERNIA REPAIR     HYSTEROSCOPY WITH D & C N/A 12/27/2017   Procedure: DILATATION AND CURETTAGE /HYSTEROSCOPY;  Surgeon: Marilynn Nest, DO;  Location: [facility];  Service: Gynecology;  Laterality: N/A;   NASAL SEPTUM SURGERY     neuromas removed from right foot     osteoma  1991   removal from right orbital   POLYPECTOMY  12/27/2017   Procedure: POLYPECTOMY;  Surgeon: Marilynn Nest, DO;  Location: [facility];  Service: Gynecology;;   removal of basal cell carcinoma (nose)     TONSILECTOMY/ADENOIDECTOMY WITH MYRINGOTOMY     TONSILLECTOMY      Family History  Problem Relation Age of Onset   Pancreatic cancer Maternal Aunt        dx. 40s-early 50s   Congestive Heart Failure Mother 47   Lung disease Mother    Colon polyps Mother        unspecified number   Dementia Father        Lewy Body Dementia   Kidney failure Father    Stroke Father    Depression Father    Multiple sclerosis Sister     Depression Sister    Aneurysm Maternal Grandmother        brain   Heart attack Maternal Grandfather    Emphysema Paternal Grandfather    Heart Problems Paternal Grandfather    Other Sister        3 sisters have history of cysts in uterus   Depression Sister    Breast cancer Cousin        dx. 54-50   Testicular cancer Cousin        dx. 30s   Colon polyps Daughter        unspecified number     Social Connections: Not on file      Current Outpatient Medications:    cetirizine  (ZYRTEC ) 10 MG tablet, Take 1 tablet (10 mg total) by mouth daily as needed for allergies., Disp: 30 tablet, Rfl: 5   cholecalciferol (VITAMIN D3) 25 MCG (1000 UNIT) tablet, Take 1,000 Units by mouth daily., Disp: , Rfl:    EPINEPHrine  (EPIPEN  2-PAK) 0.3 mg/0.3 mL IJ SOAJ injection, Inject 0.3 mg into the muscle as needed for anaphylaxis., Disp: 1 each, Rfl: 1   fluticasone  (FLONASE ) 50 MCG/ACT nasal spray, Place 2 sprays  into both nostrils as needed., Disp: 16 g, Rfl: 5   hydroxychloroquine (PLAQUENIL) 200 MG tablet, Take 200 mg by mouth in the morning and at bedtime., Disp: , Rfl:    levothyroxine  (SYNTHROID ) 75 MCG tablet, Take 75 mcg by mouth daily before breakfast., Disp: , Rfl:    Misc Natural Products (ATRANTIL) CAPS, 1 capsule Orally Twice a day, Disp: , Rfl:    PRALUENT  150 MG/ML SOAJ, INJECT 150 MG INTO THE SKIN EVERY 14 DAYS, Disp: 2 mL, Rfl: 11   VENTOLIN  HFA 108 (90 Base) MCG/ACT inhaler, Inhale 2 puffs into the lungs every 4 (four) hours as needed for wheezing or shortness of breath., Disp: 18 g, Rfl: 1   Wheat Dextrin (BENEFIBER) POWD, Take 10 mLs by mouth daily., Disp: , Rfl:    fluticasone  (VERAMYST) 27.5 MCG/SPRAY nasal spray, Place 1 spray into the nose daily., Disp: , Rfl:    Fluticasone  Furoate (ARNUITY ELLIPTA ) 100 MCG/ACT AEPB, 1 inhalation daily starting 1 week prior to and during cat exposure. With respiratory illness, start 1 puff daily for 1-2 weeks. (Patient not taking: Reported on  11/03/2022), Disp: 30 each, Rfl: 5   pantoprazole  (PROTONIX ) 40 MG tablet, Take 1 tablet by mouth daily at 12 noon., Disp: , Rfl:    Probiotic Product (SOLUBLE FIBER/PROBIOTICS PO), Take 1 capsule by mouth daily. (Patient not taking: Reported on 12/08/2022), Disp: , Rfl:    traMADol  (ULTRAM ) 50 MG tablet, Take 1 tablet (50 mg total) by mouth every 6 (six) hours as needed for severe pain or moderate pain., Disp: 30 tablet, Rfl: 0   Physical Exam:   BP 123/79 (BP Location: Right Arm, Patient Position: Sitting, Cuff Size: Normal)   Pulse 78   Resp 19   Ht 5' 4 (1.626 m)   Wt 141 lb (64 kg)   SpO2 97%   BMI 24.20 kg/m   Salient findings:  CN II-XII intact  Bilateral EAC clear and TM intact with well pneumatized middle ear spaces Anterior rhinoscopy: Septum relatively midline; bilateral inferior turbinates without significant hypertrophy No lesions of oral cavity/oropharynx No obviously palpable neck masses/lymphadenopathy/thyromegaly No respiratory distress or stridor; voice quality class I; easily tolerating secretions  Seprately Identifiable Procedures:  Procedure Note Prior, not today Findings: The nasal cavity and nasopharynx did not reveal any masses or lesions, mucosa appeared to be without obvious lesions. The tongue base, pharyngeal walls, piriform sinuses, vallecula, epiglottis and postcricoid region are normal in appearance without significant retained secretions. The visualized portion of the subglottis and proximal trachea is widely patent. The vocal folds are mobile bilaterally. There are no lesions on the free edge of the vocal folds nor elsewhere in the larynx worrisome for malignancy.       Electronically signed by: Eldora KATHEE Blanch, MD 02/12/2023 11:30 AM   Impression & Plans:  Janijah Symons is a 73 y.o. female with history of dysphagia and GERD (follows with GI, prior EGDs) and prior esophageal dilation x2 now with: Dysphagia and choking - much improved, symptoms  were worst at night time and after increasing CPAP humidification and increasing nasal humdification; MBS reassuring. No other symptoms, and prior TFL is reassuring. GMBS reassuring - improved significantly on ayr spray, now much improved since she thinks her mucus is much less thin  - Continue GERD meds per GI; upcoming dilation - MBS reassuring; will f/u PRN per preference Post nasal drip - Reports mostly in morning, using CPAP. No other sinonasal symptoms - Resolved with humidification  -  d/w pt f/u, opted PRN   Thank you for allowing me the opportunity to care for your patient. Please do not hesitate to contact me should you have any other questions.  Sincerely, Eldora Blanch, MD Otolarynoglogist (ENT), [facility] Phone: [phone] Fax: [phone]  02/12/2023, 11:30 AM   MDM:  Level 4: 99213 Complexity/Problems addressed: low Data complexity: mod - independent MBS review - Morbidity: low  - Prescription Drug prescribed or managed: no

## 2023-02-12 ENCOUNTER — Emergency Department (HOSPITAL_BASED_OUTPATIENT_CLINIC_OR_DEPARTMENT_OTHER)
Admission: EM | Admit: 2023-02-12 | Discharge: 2023-02-13 | Disposition: A | Discharge status: Home / Self Care | Payer: Medicare Other | Attending: Emergency Medicine | Admitting: Emergency Medicine

## 2023-02-12 ENCOUNTER — Encounter (HOSPITAL_BASED_OUTPATIENT_CLINIC_OR_DEPARTMENT_OTHER): Payer: Self-pay | Admitting: Emergency Medicine

## 2023-02-12 DIAGNOSIS — M79662 Pain in left lower leg: Secondary | ICD-10-CM | POA: Insufficient documentation

## 2023-02-12 NOTE — ED Triage Notes (Signed)
 Pt c/o BLE feeling heavy since around Tues; LLE is worse and "can't get comfortable"; LT calf pain; also describes "needly, tingly" feeling in LLE

## 2023-02-13 ENCOUNTER — Telehealth (HOSPITAL_BASED_OUTPATIENT_CLINIC_OR_DEPARTMENT_OTHER): Payer: Self-pay | Admitting: Emergency Medicine

## 2023-02-13 ENCOUNTER — Other Ambulatory Visit (HOSPITAL_BASED_OUTPATIENT_CLINIC_OR_DEPARTMENT_OTHER): Payer: Self-pay | Admitting: Emergency Medicine

## 2023-02-13 ENCOUNTER — Ambulatory Visit (HOSPITAL_BASED_OUTPATIENT_CLINIC_OR_DEPARTMENT_OTHER)
Admission: RE | Admit: 2023-02-13 | Discharge: 2023-02-13 | Disposition: A | Discharge status: Home / Self Care | Payer: Medicare Other | Source: Ambulatory Visit | Attending: Emergency Medicine | Admitting: Emergency Medicine

## 2023-02-13 DIAGNOSIS — M79662 Pain in left lower leg: Secondary | ICD-10-CM | POA: Insufficient documentation

## 2023-02-13 MED ORDER — LIDOCAINE 5 % EX PTCH
3.0000 | MEDICATED_PATCH | CUTANEOUS | Status: DC
  Administered 2023-02-13: 3 via TRANSDERMAL
  Filled 2023-02-13: qty 3

## 2023-02-13 MED ORDER — METHOCARBAMOL 500 MG PO TABS
1000.0000 mg | ORAL_TABLET | Freq: Once | ORAL | Status: AC
  Administered 2023-02-13: 1000 mg via ORAL
  Filled 2023-02-13: qty 2

## 2023-02-13 MED ORDER — LIDOCAINE 5 % EX PTCH: 1.0000 | MEDICATED_PATCH | CUTANEOUS | 0 refills | Status: DC

## 2023-02-13 NOTE — ED Provider Notes (Signed)
 [place] [facility] Provider Note   CSN: [id number] Arrival date & time: 02/12/23  2141     History  Chief Complaint  Patient presents with   Leg Pain    Valerie Snyder is a 73 y.o. female.  The history is provided by the patient.  Leg Pain Location:  Leg Time since incident:  1 day Injury: no   Leg location:  L lower leg Pain details:    Quality:  Cramping   Severity:  Moderate   Timing:  Constant   Progression:  Unchanged Relieved by:  Nothing Worsened by:  Nothing Associated symptoms: no back pain, no decreased ROM, no muscle weakness, no stiffness and no swelling   Patient with known neuropathy of BLE presents with calf pain.  Patient reports that she has been doing home exercises since stopping PT.  She has noticed worsening pain in the left calf and cramping pain that she has not had since stopping Statins.  She has seen vascular in the past but is concerned for DVT.  No recent travel.       Home Medications Prior to Admission medications   Medication Sig Start Date End Date Taking? Authorizing Provider  cetirizine  (ZYRTEC ) 10 MG tablet Take 1 tablet (10 mg total) by mouth daily as needed for allergies. 02/22/22   Tobie Arleta SQUIBB, MD  cholecalciferol (VITAMIN D3) 25 MCG (1000 UNIT) tablet Take 1,000 Units by mouth daily.    [provider]  EPINEPHrine  (EPIPEN  2-PAK) 0.3 mg/0.3 mL IJ SOAJ injection Inject 0.3 mg into the muscle as needed for anaphylaxis. 03/30/21   Kozlow, Camellia PARAS, MD  fluticasone  (FLONASE ) 50 MCG/ACT nasal spray Place 2 sprays into both nostrils as needed. 11/03/22   Ambs, Arlean HERO, FNP  fluticasone  (VERAMYST) 27.5 MCG/SPRAY nasal spray Place 1 spray into the nose daily.    [provider]  Fluticasone  Furoate (ARNUITY ELLIPTA ) 100 MCG/ACT AEPB 1 inhalation daily starting 1 week prior to and during cat exposure. With respiratory illness, start 1 puff daily for 1-2 weeks. Patient not taking:  Reported on 11/03/2022 02/22/22   Tobie Arleta SQUIBB, MD  hydroxychloroquine (PLAQUENIL) 200 MG tablet Take 200 mg by mouth in the morning and at bedtime. 11/27/19   [provider]  levothyroxine  (SYNTHROID ) 75 MCG tablet Take 75 mcg by mouth daily before breakfast. 12/20/19   [provider]  Misc Natural Products (ATRANTIL) CAPS 1 capsule Orally Twice a day    [provider]  pantoprazole  (PROTONIX ) 40 MG tablet Take 1 tablet by mouth daily at 12 noon. 06/17/21   [provider]  PRALUENT  150 MG/ML SOAJ INJECT 150 MG INTO THE SKIN EVERY 14 DAYS 05/03/22   Raford Riggs, MD  Probiotic Product (SOLUBLE FIBER/PROBIOTICS PO) Take 1 capsule by mouth daily. Patient not taking: Reported on 12/08/2022    [provider]  traMADol  (ULTRAM ) 50 MG tablet Take 1 tablet (50 mg total) by mouth every 6 (six) hours as needed for severe pain or moderate pain. 02/08/22   Molpus, John, MD  VENTOLIN  HFA 108 (90 Base) MCG/ACT inhaler Inhale 2 puffs into the lungs every 4 (four) hours as needed for wheezing or shortness of breath. 11/03/22   Ambs, Arlean HERO, FNP  Wheat Dextrin (BENEFIBER) POWD Take 10 mLs by mouth daily.    [provider]      Allergies    Bee venom, Cephalexin, Ketorolac  tromethamine , Atorvastatin, Rosuvastatin, and Simvastatin    Review  of Systems   Review of Systems  HENT:  Negative for facial swelling.   Respiratory:  Negative for shortness of breath, wheezing and stridor.   Cardiovascular:  Negative for chest pain and leg swelling.  Musculoskeletal:  Negative for back pain and stiffness.  All other systems reviewed and are negative.   Physical Exam Updated Vital Signs BP 132/67 (BP Location: Right Arm)   Pulse 86   Temp 97.9 F (36.6 C)   Resp 20   Ht 5' 4 (1.626 m)   Wt 64 kg   SpO2 98%   BMI 24.22 kg/m  Physical Exam Vitals and nursing note reviewed.  Constitutional:      General: She is not in acute distress.    Appearance:  She is well-developed.  HENT:     Head: Normocephalic and atraumatic.  Eyes:     Pupils: Pupils are equal, round, and reactive to light.  Cardiovascular:     Rate and Rhythm: Normal rate and regular rhythm.     Pulses: Normal pulses.     Heart sounds: Normal heart sounds.  Pulmonary:     Effort: Pulmonary effort is normal. No respiratory distress.     Breath sounds: Normal breath sounds.  Abdominal:     General: Bowel sounds are normal. There is no distension.     Palpations: Abdomen is soft.     Tenderness: There is no abdominal tenderness. There is no guarding or rebound.  Musculoskeletal:        General: Normal range of motion.     Cervical back: Neck supple.     Right lower leg: No deformity, tenderness or bony tenderness.     Left lower leg: No deformity, tenderness or bony tenderness.     Right ankle: Normal.     Right Achilles Tendon: Normal.     Left ankle: Normal.     Left Achilles Tendon: Normal.     Right foot: Normal capillary refill. No crepitus. Normal pulse.     Left foot: Normal capillary refill. No crepitus. Normal pulse.  Skin:    General: Skin is warm and dry.     Capillary Refill: Capillary refill takes less than 2 seconds.     Findings: No erythema or rash.  Neurological:     General: No focal deficit present.     Deep Tendon Reflexes: Reflexes normal.  Psychiatric:        Mood and Affect: Mood normal.     ED Results / Procedures / Treatments   Labs (all labs ordered are listed, but only abnormal results are displayed) Labs Reviewed - No data to display  EKG None  Radiology No results found.  Procedures Procedures    Medications Ordered in ED Medications  lidocaine  (LIDODERM ) 5 % 3 patch (has no administration in time range)  methocarbamol  (ROBAXIN ) tablet 1,000 mg (has no administration in time range)    ED Course/ Medical Decision Making/ A&P                                 Medical Decision Making Patient is here for concern for  DVT  Amount and/or Complexity of Data Reviewed Independent Historian: spouse    Details: See above  External Data Reviewed: notes.    Details: Previous notes reviewed   Risk Prescription drug management. Risk Details:  B pulses intact, cap refill < 2 seconds to all digits.  No swelling negative  Homan's sign.  No CP or SOB.  I have ordered DVT study for AM and have advised close follow up with PMD and vascular.  Patient verbalizes understanding and agrees to follow up     Final Clinical Impression(s) / ED Diagnoses Final diagnoses:  Pain of left calf   Return for intractable cough, coughing up blood, fevers > 100.4 unrelieved by medication, shortness of breath, intractable vomiting, chest pain, shortness of breath, weakness, numbness, changes in speech, facial asymmetry, abdominal pain, passing out, Inability to tolerate liquids or food, cough, altered mental status or any concerns. No signs of systemic illness or infection. The patient is nontoxic-appearing on exam and vital signs are within normal limits.  I have reviewed the triage vital signs and the nursing notes. Pertinent labs & imaging results that were available during my care of the patient were reviewed by me and considered in my medical decision making (see chart for details). After history, exam, and medical workup I feel the patient has been appropriately medically screened and is safe for discharge home. Pertinent diagnoses were discussed with the patient. Patient was given return precautions.  Rx / DC Orders ED Discharge Orders          Ordered    US  Venous Img Lower Unilateral Left        02/13/23 0012              Indica Marcott, MD 02/13/23 9980

## 2023-02-13 NOTE — Telephone Encounter (Signed)
 Nmedication sent to new pharmacy

## 2023-02-13 NOTE — Telephone Encounter (Cosign Needed)
 Patient was seen overnight by Dr. Nettie, ultrasound of the left calf ordered secondary to left calf pain.  I independently interpreted the venous ultrasound of the left lower extremity which shows no DVT, I called and told the patient the results.  Patient understands and will follow-up if any ongoing calf pain with her PCP.

## 2023-02-17 DIAGNOSIS — R252 Cramp and spasm: Secondary | ICD-10-CM | POA: Diagnosis not present

## 2023-02-17 DIAGNOSIS — Z6823 Body mass index (BMI) 23.0-23.9, adult: Secondary | ICD-10-CM | POA: Diagnosis not present

## 2023-02-19 DIAGNOSIS — R1031 Right lower quadrant pain: Secondary | ICD-10-CM | POA: Diagnosis not present

## 2023-02-19 DIAGNOSIS — N200 Calculus of kidney: Secondary | ICD-10-CM | POA: Diagnosis not present

## 2023-02-23 DIAGNOSIS — K317 Polyp of stomach and duodenum: Secondary | ICD-10-CM | POA: Diagnosis not present

## 2023-02-28 DIAGNOSIS — K317 Polyp of stomach and duodenum: Secondary | ICD-10-CM | POA: Diagnosis not present

## 2023-03-17 DIAGNOSIS — R140 Abdominal distension (gaseous): Secondary | ICD-10-CM | POA: Diagnosis not present

## 2023-03-17 DIAGNOSIS — R319 Hematuria, unspecified: Secondary | ICD-10-CM | POA: Diagnosis not present

## 2023-03-17 DIAGNOSIS — G629 Polyneuropathy, unspecified: Secondary | ICD-10-CM | POA: Diagnosis not present

## 2023-03-20 ENCOUNTER — Encounter: Payer: Self-pay | Admitting: Neurology

## 2023-03-20 ENCOUNTER — Other Ambulatory Visit: Payer: Self-pay | Admitting: Family Medicine

## 2023-03-20 DIAGNOSIS — R140 Abdominal distension (gaseous): Secondary | ICD-10-CM | POA: Diagnosis not present

## 2023-03-20 DIAGNOSIS — R109 Unspecified abdominal pain: Secondary | ICD-10-CM

## 2023-03-21 DIAGNOSIS — G4733 Obstructive sleep apnea (adult) (pediatric): Secondary | ICD-10-CM | POA: Diagnosis not present

## 2023-03-28 DIAGNOSIS — Z9189 Other specified personal risk factors, not elsewhere classified: Secondary | ICD-10-CM | POA: Diagnosis not present

## 2023-03-28 DIAGNOSIS — Z853 Personal history of malignant neoplasm of breast: Secondary | ICD-10-CM | POA: Diagnosis not present

## 2023-03-29 DIAGNOSIS — R319 Hematuria, unspecified: Secondary | ICD-10-CM | POA: Diagnosis not present

## 2023-04-01 ENCOUNTER — Encounter: Payer: Self-pay | Admitting: Urgent Care

## 2023-04-01 ENCOUNTER — Ambulatory Visit
Admission: EM | Admit: 2023-04-01 | Discharge: 2023-04-01 | Disposition: A | Discharge status: Home / Self Care | Attending: Family Medicine | Admitting: Family Medicine

## 2023-04-01 DIAGNOSIS — B349 Viral infection, unspecified: Secondary | ICD-10-CM

## 2023-04-01 LAB — POC COVID19/FLU A&B COMBO
Covid Antigen, POC: NEGATIVE
Influenza A Antigen, POC: NEGATIVE
Influenza B Antigen, POC: NEGATIVE

## 2023-04-01 LAB — POCT RAPID STREP A (OFFICE): Rapid Strep A Screen: NEGATIVE

## 2023-04-01 MED ORDER — PROMETHAZINE-DM 6.25-15 MG/5ML PO SYRP: 2.5000 mL | ORAL_SOLUTION | Freq: Three times a day (TID) | ORAL | 0 refills | Status: DC | PRN

## 2023-04-01 NOTE — ED Provider Notes (Signed)
 [facility]  Note:  This document was prepared using Conservation officer, historic buildings and may include unintentional dictation errors.  MRN: [id number] DOB: [birth year]  Subjective:   Valerie Snyder is a 73 y.o. female presenting for 1 day history of acute coughing, malaise, fatigue, throat pain, sinus congestion, bilateral ear pain. No fever, chest pain, shob, wheezing. Would like COVID and flu test to see if she can go to church.   No current facility-administered medications for this encounter.  Current Outpatient Medications:    cetirizine (ZYRTEC) 10 MG tablet, Take 1 tablet (10 mg total) by mouth daily as needed for allergies., Disp: 30 tablet, Rfl: 5   cholecalciferol (VITAMIN D3) 25 MCG (1000 UNIT) tablet, Take 1,000 Units by mouth daily., Disp: , Rfl:    EPINEPHrine (EPIPEN 2-PAK) 0.3 mg/0.3 mL IJ SOAJ injection, Inject 0.3 mg into the muscle as needed for anaphylaxis., Disp: 1 each, Rfl: 1   fluticasone (FLONASE) 50 MCG/ACT nasal spray, Place 2 sprays into both nostrils as needed., Disp: 16 g, Rfl: 5   Fluticasone Furoate (ARNUITY ELLIPTA) 100 MCG/ACT AEPB, 1 inhalation daily starting 1 week prior to and during cat exposure. With respiratory illness, start 1 puff daily for 1-2 weeks. (Patient not taking: Reported on 11/03/2022), Disp: 30 each, Rfl: 5   hydroxychloroquine (PLAQUENIL) 200 MG tablet, Take 200 mg by mouth in the morning and at bedtime., Disp: , Rfl:    levothyroxine (SYNTHROID) 75 MCG tablet, Take 75 mcg by mouth daily before breakfast., Disp: , Rfl:    Misc Natural Products (ATRANTIL) CAPS, 1 capsule Orally Twice a day, Disp: , Rfl:    pantoprazole (PROTONIX) 40 MG tablet, Take 1 tablet by mouth daily at 12 noon., Disp: , Rfl:    PRALUENT 150 MG/ML SOAJ, INJECT 150 MG INTO THE SKIN EVERY 14 DAYS, Disp: 2 mL, Rfl: 11   Probiotic Product (SOLUBLE FIBER/PROBIOTICS PO), Take 1 capsule by mouth daily. (Patient not taking: Reported on 12/08/2022),  Disp: , Rfl:    VENTOLIN HFA 108 (90 Base) MCG/ACT inhaler, Inhale 2 puffs into the lungs every 4 (four) hours as needed for wheezing or shortness of breath., Disp: 18 g, Rfl: 1   Wheat Dextrin (BENEFIBER) POWD, Take 10 mLs by mouth daily., Disp: , Rfl:    Allergies  Allergen Reactions   Bee Venom Anaphylaxis   Cephalexin Anaphylaxis and Hives    Puffy eyes and lips  Other reaction(s): facial swelling   Ketorolac Tromethamine Anaphylaxis    Puffy lips and eyes Other reaction(s): anaphylaxis   Atorvastatin     Other reaction(s): muscle cramps   Rosuvastatin Other (See Comments)    Other reaction(s): muscle cramps  Other reaction(s): Other (See Comments)   Simvastatin     Other reaction(s): muscle cramps Other reaction(s): muscle cramps, Other (See Comments) Other reaction(s): muscle cramps     Past Medical History:  Diagnosis Date   Abnormal uterine bleeding (AUB)    Allergy    Asthma    allergy related   Breast cancer of 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  72nd percentile 11/2018.   Complication of anesthesia    10 years ago- pt states she stopped breathing and "had to be bagged"   Concussion 2018   GERD (gastroesophageal reflux disease)    H/O bilateral inguinal hernia repair 1992   Hypercholesteremia    Hypothyroidism  Migraines    Personal history of radiation therapy    Plantar fasciitis, bilateral    Pure hypercholesterolemia 10/10/2018   Radiation 01/12/15-02/04/15   right breast 42.17 gray   Spinal stenosis    Thyroid disease      Past Surgical History:  Procedure Laterality Date   BLADDER SUSPENSION     BLEPHAROPLASTY Bilateral    BREAST BIOPSY Left 01/28/2022   MM LT BREAST BX W LOC DEV 1ST LESION IMAGE BX SPEC STEREO GUIDE 01/28/2022 [facility]   BREAST LUMPECTOMY Right 12/10/2014   BREAST LUMPECTOMY WITH RADIOACTIVE  SEED LOCALIZATION Right 12/10/2014   Procedure: RIGHT BREAST LUMPECTOMY WITH RADIOACTIVE SEED LOCALIZATION;  Surgeon: Chevis Pretty III, MD;  Location: [facility];  Service: General;  Laterality: Right;   CHOLECYSTECTOMY N/A 02/20/2021   Procedure: LAPAROSCOPIC CHOLECYSTECTOMY;  Surgeon: Axel Filler, MD;  Location: [facility] OR;  Service: General;  Laterality: N/A;   DILATATION & CURETTAGE/HYSTEROSCOPY WITH MYOSURE N/A 07/15/2020   Procedure: DILATATION & CURETTAGE/HYSTEROSCOPY Carollee Leitz USING MYOSURE;  Surgeon: Gerald Leitz, MD;  Location: [facility];  Service: Gynecology;  Laterality: N/A;   FOOT SURGERY     plantar fashiectomies from both feet   HERNIA REPAIR     HYSTEROSCOPY WITH D & C N/A 12/27/2017   Procedure: DILATATION AND CURETTAGE /HYSTEROSCOPY;  Surgeon: Myna Hidalgo, DO;  Location: [facility];  Service: Gynecology;  Laterality: N/A;   NASAL SEPTUM SURGERY     neuromas removed from right foot     osteoma  1991   removal from right orbital   POLYPECTOMY  12/27/2017   Procedure: POLYPECTOMY;  Surgeon: Myna Hidalgo, DO;  Location: [facility];  Service: Gynecology;;   removal of basal cell carcinoma (nose)     TONSILECTOMY/ADENOIDECTOMY WITH MYRINGOTOMY     TONSILLECTOMY      Family History  Problem Relation Age of Onset   Pancreatic cancer Maternal Aunt        dx. 40s-early 50s   Congestive Heart Failure Mother 36   Lung disease Mother    Colon polyps Mother        unspecified number   Dementia Father        Lewy Body Dementia   Kidney failure Father    Stroke Father    Depression Father    Multiple sclerosis Sister    Depression Sister    Aneurysm Maternal Grandmother        brain   Heart attack Maternal Grandfather    Emphysema Paternal Grandfather    Heart Problems Paternal Grandfather    Other Sister        3 sisters have history of cysts in uterus   Depression Sister    Breast cancer Cousin         dx. 62-50   Testicular cancer Cousin        dx. 30s   Colon polyps Daughter        unspecified number    Social History   Tobacco Use   Smoking status: Never   Smokeless tobacco: Never  Vaping Use   Vaping status: Never Used  Substance Use Topics   Alcohol use: No   Drug use: No    ROS   Objective:   Vitals: There were no vitals taken for this visit.  Physical Exam  No results found for this or any previous visit (from the past 24 hours).  Assessment and Plan :   PDMP not reviewed  this encounter.  No diagnosis found.

## 2023-04-01 NOTE — ED Triage Notes (Signed)
 C/O cough and sneezing x 1 day.

## 2023-04-01 NOTE — Discharge Instructions (Signed)
 We will manage this as a viral syndrome. For sore throat or cough try using a honey-based tea. Use 3 teaspoons of honey with juice squeezed from half lemon. Place shaved pieces of ginger into 1/2-1 cup of water and warm over stove top. Then mix the ingredients and repeat every 4 hours as needed. Please take Tylenol 500mg -650mg  once every 6 hours for fevers, aches and pains. Hydrate very well with at least 2 liters (64 ounces) of water. Eat light meals such as soups (chicken and noodles, chicken wild rice, vegetable).  Do not eat any foods that you are allergic to.  Start an antihistamine like Zyrtec (10mg  daily) for postnasal drainage, sinus congestion.  You can take this together with cough syrup as needed.

## 2023-04-05 DIAGNOSIS — E663 Overweight: Secondary | ICD-10-CM | POA: Diagnosis not present

## 2023-04-05 DIAGNOSIS — M79642 Pain in left hand: Secondary | ICD-10-CM | POA: Diagnosis not present

## 2023-04-05 DIAGNOSIS — M79641 Pain in right hand: Secondary | ICD-10-CM | POA: Diagnosis not present

## 2023-04-05 DIAGNOSIS — M154 Erosive (osteo)arthritis: Secondary | ICD-10-CM | POA: Diagnosis not present

## 2023-04-05 DIAGNOSIS — Z6825 Body mass index (BMI) 25.0-25.9, adult: Secondary | ICD-10-CM | POA: Diagnosis not present

## 2023-04-10 ENCOUNTER — Ambulatory Visit: Payer: Medicare Other | Admitting: Podiatry

## 2023-04-11 DIAGNOSIS — M19042 Primary osteoarthritis, left hand: Secondary | ICD-10-CM | POA: Diagnosis not present

## 2023-04-11 DIAGNOSIS — G5601 Carpal tunnel syndrome, right upper limb: Secondary | ICD-10-CM | POA: Diagnosis not present

## 2023-04-12 DIAGNOSIS — R350 Frequency of micturition: Secondary | ICD-10-CM | POA: Diagnosis not present

## 2023-04-12 DIAGNOSIS — R3121 Asymptomatic microscopic hematuria: Secondary | ICD-10-CM | POA: Diagnosis not present

## 2023-04-17 DIAGNOSIS — E7800 Pure hypercholesterolemia, unspecified: Secondary | ICD-10-CM | POA: Diagnosis not present

## 2023-04-18 DIAGNOSIS — I781 Nevus, non-neoplastic: Secondary | ICD-10-CM | POA: Diagnosis not present

## 2023-04-18 DIAGNOSIS — L814 Other melanin hyperpigmentation: Secondary | ICD-10-CM | POA: Diagnosis not present

## 2023-04-18 DIAGNOSIS — L821 Other seborrheic keratosis: Secondary | ICD-10-CM | POA: Diagnosis not present

## 2023-04-18 DIAGNOSIS — Z85828 Personal history of other malignant neoplasm of skin: Secondary | ICD-10-CM | POA: Diagnosis not present

## 2023-04-18 LAB — COMPREHENSIVE METABOLIC PANEL
ALT: 14 IU/L (ref 0–32)
AST: 15 IU/L (ref 0–40)
Albumin: 4.3 g/dL (ref 3.8–4.8)
Alkaline Phosphatase: 64 IU/L (ref 44–121)
BUN/Creatinine Ratio: 12 (ref 12–28)
BUN: 8 mg/dL (ref 8–27)
Bilirubin Total: 0.4 mg/dL (ref 0.0–1.2)
CO2: 24 mmol/L (ref 20–29)
Calcium: 9.4 mg/dL (ref 8.7–10.3)
Chloride: 103 mmol/L (ref 96–106)
Creatinine, Ser: 0.66 mg/dL (ref 0.57–1.00)
Globulin, Total: 2.3 g/dL (ref 1.5–4.5)
Glucose: 96 mg/dL (ref 70–99)
Potassium: 4.5 mmol/L (ref 3.5–5.2)
Sodium: 140 mmol/L (ref 134–144)
Total Protein: 6.6 g/dL (ref 6.0–8.5)
eGFR: 92 mL/min/{1.73_m2} (ref >59)

## 2023-04-18 LAB — LIPID PANEL
Chol/HDL Ratio: 2.1 ratio (ref 0.0–4.4)
Cholesterol, Total: 134 mg/dL (ref 100–199)
HDL: 65 mg/dL (ref >39)
LDL Chol Calc (NIH): 55 mg/dL (ref 0–99)
Triglycerides: 67 mg/dL (ref 0–149)
VLDL Cholesterol Cal: 14 mg/dL (ref 5–40)

## 2023-04-19 ENCOUNTER — Ambulatory Visit
Admission: RE | Admit: 2023-04-19 | Discharge: 2023-04-19 | Disposition: A | Discharge status: Home / Self Care | Payer: Medicare Other | Source: Ambulatory Visit | Attending: Family Medicine

## 2023-04-19 DIAGNOSIS — R140 Abdominal distension (gaseous): Secondary | ICD-10-CM

## 2023-04-19 DIAGNOSIS — R109 Unspecified abdominal pain: Secondary | ICD-10-CM

## 2023-04-19 DIAGNOSIS — R1030 Lower abdominal pain, unspecified: Secondary | ICD-10-CM | POA: Diagnosis not present

## 2023-04-19 MED ORDER — IOPAMIDOL (ISOVUE-300) INJECTION 61%
100.0000 mL | Freq: Once | INTRAVENOUS | Status: AC | PRN
  Administered 2023-04-19: 100 mL via INTRAVENOUS

## 2023-04-21 ENCOUNTER — Ambulatory Visit (INDEPENDENT_AMBULATORY_CARE_PROVIDER_SITE_OTHER): Payer: Medicare Other | Admitting: Podiatry

## 2023-04-21 ENCOUNTER — Encounter: Payer: Self-pay | Admitting: Podiatry

## 2023-04-21 DIAGNOSIS — M722 Plantar fascial fibromatosis: Secondary | ICD-10-CM

## 2023-04-21 DIAGNOSIS — G629 Polyneuropathy, unspecified: Secondary | ICD-10-CM | POA: Diagnosis not present

## 2023-04-24 ENCOUNTER — Other Ambulatory Visit (HOSPITAL_BASED_OUTPATIENT_CLINIC_OR_DEPARTMENT_OTHER): Payer: Self-pay | Admitting: Cardiovascular Disease

## 2023-04-24 DIAGNOSIS — I2510 Atherosclerotic heart disease of native coronary artery without angina pectoris: Secondary | ICD-10-CM

## 2023-04-24 DIAGNOSIS — E7800 Pure hypercholesterolemia, unspecified: Secondary | ICD-10-CM

## 2023-04-25 ENCOUNTER — Ambulatory Visit
Admission: EM | Admit: 2023-04-25 | Discharge: 2023-04-25 | Disposition: A | Discharge status: Home / Self Care | Attending: Family Medicine | Admitting: Family Medicine

## 2023-04-25 DIAGNOSIS — J018 Other acute sinusitis: Secondary | ICD-10-CM

## 2023-04-25 MED ORDER — PREDNISONE 20 MG PO TABS: 20.0000 mg | ORAL_TABLET | Freq: Every day | ORAL | 0 refills | Status: DC

## 2023-04-25 MED ORDER — DOXYCYCLINE HYCLATE 100 MG PO CAPS: 100.0000 mg | ORAL_CAPSULE | Freq: Two times a day (BID) | ORAL | 0 refills | Status: DC

## 2023-04-25 NOTE — ED Provider Notes (Signed)
 [facility]  Note:  This document was prepared using Conservation officer, historic buildings and may include unintentional dictation errors.  MRN: [id number] DOB: Aug 23, 1950  Subjective:   Valerie Snyder is a 73 y.o. female presenting for recheck on ongoing upper respiratory symptoms.  Patient reports a history of sinus infections.  She was initially seen 04/01/2023.  Was advised supportive care for viral respiratory infection.  Testing was negative.  She has continued to have mucus and drainage.  Wakes up with right-sided facial pain and pressure.  Has had productive bloody mucus from her sinuses at times.  No chest pain, shortness of breath or wheezing.  No confusion, vision changes, weakness, numbness or tingling.  No current facility-administered medications for this encounter.  Current Outpatient Medications:    Alirocumab (PRALUENT) 150 MG/ML SOAJ, INJECT UNDER THE SKIN 1 ML EVERY 14 DAYS, Disp: 2 mL, Rfl: 0   cetirizine (ZYRTEC) 10 MG tablet, Take 1 tablet (10 mg total) by mouth daily as needed for allergies., Disp: 30 tablet, Rfl: 5   cholecalciferol (VITAMIN D3) 25 MCG (1000 UNIT) tablet, Take 1,000 Units by mouth daily., Disp: , Rfl:    EPINEPHrine (EPIPEN 2-PAK) 0.3 mg/0.3 mL IJ SOAJ injection, Inject 0.3 mg into the muscle as needed for anaphylaxis., Disp: 1 each, Rfl: 1   fluticasone (FLONASE) 50 MCG/ACT nasal spray, Place 2 sprays into both nostrils as needed., Disp: 16 g, Rfl: 5   Fluticasone Furoate (ARNUITY ELLIPTA) 100 MCG/ACT AEPB, 1 inhalation daily starting 1 week prior to and during cat exposure. With respiratory illness, start 1 puff daily for 1-2 weeks. (Patient not taking: Reported on 04/21/2023), Disp: 30 each, Rfl: 5   hydroxychloroquine (PLAQUENIL) 200 MG tablet, Take 200 mg by mouth in the morning and at bedtime., Disp: , Rfl:    levothyroxine (SYNTHROID) 75 MCG tablet, Take 75 mcg by mouth daily before breakfast., Disp: , Rfl:    Misc Natural  Products (ATRANTIL) CAPS, 1 capsule Orally Twice a day, Disp: , Rfl:    pantoprazole (PROTONIX) 40 MG tablet, Take 1 tablet by mouth daily at 12 noon., Disp: , Rfl:    Probiotic Product (SOLUBLE FIBER/PROBIOTICS PO), Take 1 capsule by mouth daily., Disp: , Rfl:    promethazine-dextromethorphan (PROMETHAZINE-DM) 6.25-15 MG/5ML syrup, Take 2.5 mLs by mouth 3 (three) times daily as needed for cough., Disp: 100 mL, Rfl: 0   VENTOLIN HFA 108 (90 Base) MCG/ACT inhaler, Inhale 2 puffs into the lungs every 4 (four) hours as needed for wheezing or shortness of breath., Disp: 18 g, Rfl: 1   Wheat Dextrin (BENEFIBER) POWD, Take 10 mLs by mouth daily., Disp: , Rfl:    Allergies  Allergen Reactions   Bee Venom Anaphylaxis   Cephalexin Anaphylaxis and Hives    Puffy eyes and lips  Other reaction(s): facial swelling   Ketorolac Tromethamine Anaphylaxis    Puffy lips and eyes Other reaction(s): anaphylaxis   Atorvastatin     Other reaction(s): muscle cramps   Rosuvastatin Other (See Comments)    Other reaction(s): muscle cramps  Other reaction(s): Other (See Comments)   Simvastatin     Other reaction(s): muscle cramps Other reaction(s): muscle cramps, Other (See Comments) Other reaction(s): muscle cramps     Past Medical History:  Diagnosis Date   Abnormal uterine bleeding (AUB)    Allergy    Asthma    allergy related   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  72nd percentile 11/2018.   Complication of anesthesia    10 years ago- pt states she stopped breathing and "had to be bagged"   Concussion 2018   GERD (gastroesophageal reflux disease)    H/O bilateral inguinal hernia repair 1992   Hypercholesteremia    Hypothyroidism    Migraines    Personal history of radiation therapy    Plantar fasciitis, bilateral    Pure hypercholesterolemia 10/10/2018    Radiation 01/12/15-02/04/15   right breast 42.17 gray   Spinal stenosis    Thyroid disease      Past Surgical History:  Procedure Laterality Date   BLADDER SUSPENSION     BLEPHAROPLASTY Bilateral    BREAST BIOPSY Left 01/28/2022   MM LT BREAST BX W LOC DEV 1ST LESION IMAGE BX SPEC STEREO GUIDE 01/28/2022 [facility]   BREAST LUMPECTOMY Right 12/10/2014   BREAST LUMPECTOMY WITH RADIOACTIVE SEED LOCALIZATION Right 12/10/2014   Procedure: RIGHT BREAST LUMPECTOMY WITH RADIOACTIVE SEED LOCALIZATION;  Surgeon: Chevis Pretty III, MD;  Location: [facility];  Service: General;  Laterality: Right;   CHOLECYSTECTOMY N/A 02/20/2021   Procedure: LAPAROSCOPIC CHOLECYSTECTOMY;  Surgeon: Axel Filler, MD;  Location: [facility] OR;  Service: General;  Laterality: N/A;   DILATATION & CURETTAGE/HYSTEROSCOPY WITH MYOSURE N/A 07/15/2020   Procedure: DILATATION & CURETTAGE/HYSTEROSCOPY Carollee Leitz USING MYOSURE;  Surgeon: Gerald Leitz, MD;  Location: [facility];  Service: Gynecology;  Laterality: N/A;   FOOT SURGERY     plantar fashiectomies from both feet   HERNIA REPAIR     HYSTEROSCOPY WITH D & C N/A 12/27/2017   Procedure: DILATATION AND CURETTAGE /HYSTEROSCOPY;  Surgeon: Myna Hidalgo, DO;  Location: [facility];  Service: Gynecology;  Laterality: N/A;   NASAL SEPTUM SURGERY     neuromas removed from right foot     osteoma  1991   removal from right orbital   POLYPECTOMY  12/27/2017   Procedure: POLYPECTOMY;  Surgeon: Myna Hidalgo, DO;  Location: [facility];  Service: Gynecology;;   removal of basal cell carcinoma (nose)     TONSILECTOMY/ADENOIDECTOMY WITH MYRINGOTOMY     TONSILLECTOMY      Family History  Problem Relation Age of Onset   Pancreatic cancer Maternal Aunt        dx. 40s-early 50s   Congestive Heart Failure Mother 77   Lung disease Mother    Colon polyps Mother        unspecified number   Dementia Father        Lewy  Body Dementia   Kidney failure Father    Stroke Father    Depression Father    Multiple sclerosis Sister    Depression Sister    Aneurysm Maternal Grandmother        brain   Heart attack Maternal Grandfather    Emphysema Paternal Grandfather    Heart Problems Paternal Grandfather    Other Sister        3 sisters have history of cysts in uterus   Depression Sister    Breast cancer Cousin        dx. 53-50   Testicular cancer Cousin        dx. 30s   Colon polyps Daughter        unspecified number    Social History   Tobacco Use   Smoking status: Never   Smokeless tobacco: Never  Vaping  Use   Vaping status: Never Used  Substance Use Topics   Alcohol use: No   Drug use: No    ROS   Objective:   Vitals: BP 119/74 (BP Location: Left Arm)   Pulse 79   Temp 98.1 F (36.7 C) (Oral)   Resp 16   Ht 5\' 5"  (1.651 m)   Wt 146 lb (66.2 kg)   SpO2 94%   BMI 24.30 kg/m   Physical Exam Constitutional:      General: She is not in acute distress.    Appearance: Normal appearance. She is well-developed and normal weight. She is not ill-appearing, toxic-appearing or diaphoretic.  HENT:     Head: Normocephalic and atraumatic.     Right Ear: Tympanic membrane, ear canal and external ear normal. No drainage or tenderness. No middle ear effusion. There is no impacted cerumen. Tympanic membrane is not erythematous or bulging.     Left Ear: Tympanic membrane, ear canal and external ear normal. No drainage or tenderness.  No middle ear effusion. There is no impacted cerumen. Tympanic membrane is not erythematous or bulging.     Nose: Congestion present. No rhinorrhea.     Mouth/Throat:     Mouth: Mucous membranes are moist. No oral lesions.     Pharynx: No pharyngeal swelling, oropharyngeal exudate, posterior oropharyngeal erythema or uvula swelling.     Tonsils: No tonsillar exudate or tonsillar abscesses.  Eyes:     General: No scleral icterus.       Right eye: No discharge.         Left eye: No discharge.     Extraocular Movements: Extraocular movements intact.     Right eye: Normal extraocular motion.     Left eye: Normal extraocular motion.     Conjunctiva/sclera: Conjunctivae normal.  Cardiovascular:     Rate and Rhythm: Normal rate and regular rhythm.     Heart sounds: Normal heart sounds. No murmur heard.    No friction rub. No gallop.  Pulmonary:     Effort: Pulmonary effort is normal. No respiratory distress.     Breath sounds: No stridor. No wheezing, rhonchi or rales.  Chest:     Chest wall: No tenderness.  Musculoskeletal:     Cervical back: Normal range of motion and neck supple.  Lymphadenopathy:     Cervical: No cervical adenopathy.  Skin:    General: Skin is warm and dry.  Neurological:     General: No focal deficit present.     Mental Status: She is alert and oriented to person, place, and time.  Psychiatric:        Mood and Affect: Mood normal.        Behavior: Behavior normal.     Assessment and Plan :   PDMP not reviewed this encounter.  1. Acute non-recurrent sinusitis of other sinus    Will start empiric treatment for sinusitis with doxycycline given medication allergies to beta-lactams.  Will also recommend 20 mg prednisone to help with her allergic rhinitis, chronic sinus inflammation.  Recommended supportive care otherwise. Counseled patient on potential for adverse effects with medications prescribed/recommended today, ER and return-to-clinic precautions discussed, patient verbalized understanding.    Wallis Bamberg, PA-C 04/25/23 1800

## 2023-04-25 NOTE — Discharge Instructions (Addendum)
 We will manage this as a sinus infection with doxycycline. For sore throat or cough try using a honey-based tea. Use 3 teaspoons of honey with juice squeezed from half lemon. Place shaved pieces of ginger into 1/2-1 cup of water and warm over stove top. Then mix the ingredients and repeat every 4 hours as needed. Please take Tylenol 650mg  every 6 hours for throat pain, fevers, aches and pains. Hydrate very well with at least 2 liters of water. Eat light meals such as soups (chicken and noodles, vegetable, chicken and wild rice).  Do not eat foods that you are allergic to.  Taking an antihistamine like Zyrtec can help against postnasal drainage, sinus congestion which can cause sinus pain, sinus headaches, throat pain, painful swallowing, coughing.  You can take this together with prednisone to help with your sinus inflammation and mucus plugging.  Use cough medication as needed.

## 2023-04-25 NOTE — Progress Notes (Signed)
 Subjective: Chief Complaint  Patient presents with   Plantar Fasciitis    RM#13 Follow up on plantar fasciitis bilateral has completed physical therapy and still continues the exercises.    73 year old female presents the office with above concerns.  States that she is having problems with both of her legs and her doctors ordered a nerve conduction test to check both legs.  She is still continuing alpha lipoic acid.  Still has ongoing pain to the heel, arch.  She has completed physical therapy.  No recent injuries or new concerns today.  Objective: AAO x3, NAD-presents with husband DP/PT pulses palpable bilaterally, CRT less than 3 seconds Hammertoes are present.  Unable to appreciate any area pinpoint tenderness bilaterally.  There is still some ongoing discomfort noted on the arch along the course of the plantar fascia.  There is no edema, erythema.  Flexor, sensory tendons appear to be intact.  MMT 5/5.  Negative Tinel sign. No pain with calf compression, swelling, warmth, erythema  Assessment: Plantar fasciitis, neuropathy  Plan: -All treatment options discussed with the patient including all alternatives, risks, complications.  -We again reviewed her previous circulation test as well as nerve biopsy results.  She has not yet had conduction test pending given increased discomfort to both of her legs.  She has good pulses and I feel this is more neurological in origin.  Although she is completed formal physical therapy like for her to continue home stretching, rehab exercises.  Discussed shoes, good arch support.  Continue with alpha lipoic acid.  Discussed possibility of further repeat nerve biopsy in the future if needed.  Return in about 3 months (around 07/22/2023) for foot pain .  Vivi Barrack DPM

## 2023-04-25 NOTE — ED Triage Notes (Addendum)
 Patient presents with watery eyes, pain behind eyes, headache, cough and red/brown mucus x 1 week. Patient states symptoms has gotten worse since her last visit. Treating with Guaifenesin 400 mg BID and Tylenol.

## 2023-04-27 ENCOUNTER — Ambulatory Visit (HOSPITAL_BASED_OUTPATIENT_CLINIC_OR_DEPARTMENT_OTHER): Payer: Medicare Other | Admitting: Cardiovascular Disease

## 2023-04-27 ENCOUNTER — Other Ambulatory Visit: Payer: Self-pay | Admitting: Family

## 2023-04-27 ENCOUNTER — Encounter (HOSPITAL_BASED_OUTPATIENT_CLINIC_OR_DEPARTMENT_OTHER): Payer: Self-pay | Admitting: Cardiovascular Disease

## 2023-04-27 VITALS — BP 116/70 | HR 73 | Ht 65.0 in | Wt 153.8 lb

## 2023-04-27 DIAGNOSIS — I2510 Atherosclerotic heart disease of native coronary artery without angina pectoris: Secondary | ICD-10-CM

## 2023-04-27 DIAGNOSIS — E7800 Pure hypercholesterolemia, unspecified: Secondary | ICD-10-CM | POA: Diagnosis not present

## 2023-04-27 NOTE — Patient Instructions (Signed)
 Medication Instructions:  Your physician recommends that you continue on your current medications as directed. Please refer to the Current Medication list given to you today.   *If you need a refill on your cardiac medications before your next appointment, please call your pharmacy*  Lab Work: NONE  Testing/Procedures: NONE  Follow-Up: At [facility], you and your health needs are our priority.  As part of our continuing mission to provide you with exceptional heart care, we have created designated Provider Care Teams.  These Care Teams include your primary Cardiologist (physician) and Advanced Practice Providers (APPs -  Physician Assistants and Nurse Practitioners) who all work together to provide you with the care you need, when you need it.  We recommend signing up for the patient portal called "MyChart".  Sign up information is provided on this After Visit Summary.  MyChart is used to connect with patients for Virtual Visits (Telemedicine).  Patients are able to view lab/test results, encounter notes, upcoming appointments, etc.  Non-urgent messages can be sent to your provider as well.   To learn more about what you can do with MyChart, go to [email or web address].    Your next appointment:   12 month(s)  Provider:   Chilton Si, MD, Eligha Bridegroom, NP, or Gillian Shields, NP

## 2023-04-27 NOTE — Progress Notes (Signed)
 " Cardiology Office Note:  .   Date:  04/28/2023  ID:  Valerie Snyder, DOB August 26, 1950, MRN [id number] PCP: Dayna Motto, DO   HeartCare Providers Cardiologist:  Annabella Scarce, MD    History of Present Illness: .    Valerie Snyder is a 73 y.o. female with asymptomatic coronary calcification and hyperlipidemia here for follow up.  In April 2020 she developed tachycardia while dancing.   She dances daily for at least 20 minutes.  One day she started feeling like heart was racing while dancing.  The episode lasted for around an hour.  She called her PCP who recommended that she be seen in the ED.  She had no chest pressure, shortness of breath, lightheadedness or dizziness.  By the time she got to the ED her symptoms had subsided.  Telemetry was unremarkable. She has a longstanding history of hyperlipidemia despite having an excellent diet and exercising regularly.  She has a strong family history of CAD and atrial fibrillation.     Valerie Snyder had a coronary calcium score 11/2018 that was 72nd perecentile for age/gender.  She started on Zetia  and has tolerated it well.  In the past she tried atorvastatin and rosuvastatin but developed myalgias.  She thinks she has tried other statins as well but cannot remember them.  Her lipids were above goal so pravastatin  was added to her regimen. She tolerated this well with no cramping. Valerie Snyder was seen in the ED 01/18/2021 with right lower back pain since the night prior. She had not been taking Celebrex  for several days due to withholding it for a scheduled procedure. It was thought her symptoms were related to muscle spasm.   She had a laparoscopic cholecystectomy 01/2021.  She was seen 08/2021 and was doing well on Praluent .  Zetia  was discontinued.  She was referred to the PREP program 04/2022.    Discussed the use of AI scribe software for clinical note transcription with the patient, who gave verbal consent to proceed.  History of  Present Illness Valerie Snyder is accompanied by her husband, [name].  She experiences discomfort in her chest and left side of her back, which she associates with lymphedema on the operative side from her previous breast cancer surgery. This discomfort is exacerbated by certain movements, such as reaching under a cabinet, and she has been doing lymphedema physical therapy exercises. She also reports a sensation of fullness and pressure in her chest, which she describes as feeling like her 'skeletal system is too small for my lungs.' This occurs without a clear trigger and is not associated with any specific food or activity. No heart-related symptoms during physical activity.  She reports experiencing heaviness in her legs, describing them as feeling like 'cement over shoes,' which has been ongoing for a couple of months. This sensation has made it challenging to go up and down stairs, although she continues to exercise daily. 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  She has ongoing gastrointestinal issues, which have been more controlled recently with the use of an over-the-counter homeopathic remedy called Atrantil, recommended by her gastroenterologist. She had a prolonged recovery from a gallbladder removal surgery two years ago, which initially caused significant bloating. She follows a Mediterranean diet and drinks between 64 and 96 ounces of water daily. Reports bloating and distention, but recent CT scan  was normal.  She has a history of osteoarthritis, which she describes as rapidly advancing, affecting her fingers and potentially her spine. She takes hydroxychloroquine (Plaquenil) for this condition. She experiences a band-like sensation of discomfort that wraps around her back, which she suspects may be related to her arthritis.  She is currently on  Praluent  for cholesterol management, which has been effective in maintaining her cholesterol levels. Her recent blood pressure readings have been good, and she continues to engage in regular physical activity, including walking and physical therapy exercises.  ROS:  As per HPI  Studies Reviewed: SABRA       Calcium score 11/2018: IMPRESSION: Coronary calcium score of 95.93. This was 72nd percentile for age and sex matched control.   Risk Assessment/Calculations:             Physical Exam:   VS:  BP 116/70   Pulse 73   Ht 5' 5 (1.651 m)   Wt 153 lb 12.8 oz (69.8 kg)   SpO2 97%   BMI 25.59 kg/m  , BMI Body mass index is 25.59 kg/m. GENERAL:  Well appearing HEENT: Pupils equal round and reactive, fundi not visualized, oral mucosa unremarkable NECK:  No jugular venous distention, waveform within normal limits, carotid upstroke brisk and symmetric, no bruits, no thyromegaly LUNGS:  Clear to auscultation bilaterally HEART:  RRR.  PMI not displaced or sustained,S1 and S2 within normal limits, no S3, no S4, no clicks, no rubs, no murmurs ABD:  Flat, positive bowel sounds normal in frequency in pitch, no bruits, no rebound, no guarding, no midline pulsatile mass, no hepatomegaly, no splenomegaly EXT:  2 plus pulses throughout, no edema, no cyanosis no clubbing SKIN:  No rashes no nodules NEURO:  Cranial nerves II through XII grossly intact, motor grossly intact throughout PSYCH:  Cognitively intact, oriented to person place and time   ASSESSMENT AND PLAN: .    Assessment & Plan # Chest discomfort Intermittent chest discomfort and left-sided back pain with normal CT scan results, indicating no cardiac involvement. Symptoms may be related to musculoskeletal issues or nerve impingement, possibly due to scar tissue from previous surgery. - Refer to [facility] for spine evaluation to assess for potential nerve impingement or musculoskeletal issues.  # Non-obstructive CAD:  # Aortic  Atherosclerosis Mild atherosclerosis. Praluent  effectively manages cholesterol levels, reducing cardiovascular event risk. She adheres to a Mediterranean diet and regular exercise.  # Lymphedema Lymphedema on the right side, the operative side from previous breast cancer surgery. Pain and twisting sensation during certain movements, likely related to scar tissue or nerve impingement. - Continue lymphedema physical therapy exercises.  # Osteoarthritis Rapidly advancing osteoarthritis, particularly affecting the hands. She is on hydroxychloroquine (Plaquenil) for management.  General Health Maintenance Blood pressure and cholesterol levels are well-controlled. She follows a Mediterranean diet and maintains regular physical activity.  Follow-up - Follow-up appointment scheduled for June.      Dispo: f/u in 1 year  Signed, Annabella Scarce, MD   "

## 2023-04-28 ENCOUNTER — Encounter (HOSPITAL_BASED_OUTPATIENT_CLINIC_OR_DEPARTMENT_OTHER): Payer: Self-pay | Admitting: Cardiovascular Disease

## 2023-05-01 ENCOUNTER — Ambulatory Visit: Payer: Medicare Other | Admitting: Neurology

## 2023-05-03 DIAGNOSIS — E663 Overweight: Secondary | ICD-10-CM | POA: Diagnosis not present

## 2023-05-03 DIAGNOSIS — M79642 Pain in left hand: Secondary | ICD-10-CM | POA: Diagnosis not present

## 2023-05-03 DIAGNOSIS — Z6825 Body mass index (BMI) 25.0-25.9, adult: Secondary | ICD-10-CM | POA: Diagnosis not present

## 2023-05-03 DIAGNOSIS — M79641 Pain in right hand: Secondary | ICD-10-CM | POA: Diagnosis not present

## 2023-05-03 DIAGNOSIS — M154 Erosive (osteo)arthritis: Secondary | ICD-10-CM | POA: Diagnosis not present

## 2023-05-14 ENCOUNTER — Encounter (HOSPITAL_BASED_OUTPATIENT_CLINIC_OR_DEPARTMENT_OTHER): Payer: Self-pay | Admitting: Cardiovascular Disease

## 2023-05-16 ENCOUNTER — Ambulatory Visit (INDEPENDENT_AMBULATORY_CARE_PROVIDER_SITE_OTHER): Admitting: Allergy and Immunology

## 2023-05-16 ENCOUNTER — Encounter: Payer: Self-pay | Admitting: Allergy and Immunology

## 2023-05-16 ENCOUNTER — Other Ambulatory Visit: Payer: Self-pay

## 2023-05-16 VITALS — BP 114/60 | HR 76 | Temp 97.9°F | Resp 12 | Ht 64.17 in | Wt 157.5 lb

## 2023-05-16 DIAGNOSIS — H1013 Acute atopic conjunctivitis, bilateral: Secondary | ICD-10-CM

## 2023-05-16 DIAGNOSIS — J301 Allergic rhinitis due to pollen: Secondary | ICD-10-CM

## 2023-05-16 DIAGNOSIS — Z91038 Other insect allergy status: Secondary | ICD-10-CM

## 2023-05-16 DIAGNOSIS — J4520 Mild intermittent asthma, uncomplicated: Secondary | ICD-10-CM | POA: Diagnosis not present

## 2023-05-16 DIAGNOSIS — J3089 Other allergic rhinitis: Secondary | ICD-10-CM | POA: Diagnosis not present

## 2023-05-16 DIAGNOSIS — R140 Abdominal distension (gaseous): Secondary | ICD-10-CM | POA: Diagnosis not present

## 2023-05-16 DIAGNOSIS — H1010 Acute atopic conjunctivitis, unspecified eye: Secondary | ICD-10-CM

## 2023-05-16 MED ORDER — EPINEPHRINE 0.3 MG/0.3ML IJ SOAJ: 0.3000 mg | INTRAMUSCULAR | 1 refills | Status: AC | PRN

## 2023-05-16 MED ORDER — CETIRIZINE HCL 10 MG PO TABS: 10.0000 mg | ORAL_TABLET | Freq: Every day | ORAL | 3 refills | Status: AC | PRN

## 2023-05-16 MED ORDER — FLUTICASONE PROPIONATE 50 MCG/ACT NA SUSP: 2.0000 | NASAL | 3 refills | Status: AC | PRN

## 2023-05-16 MED ORDER — VENTOLIN HFA 108 (90 BASE) MCG/ACT IN AERS: 2.0000 | INHALATION_SPRAY | RESPIRATORY_TRACT | 1 refills | Status: AC | PRN

## 2023-05-16 MED ORDER — OLOPATADINE HCL 0.2 % OP SOLN: 1.0000 [drp] | OPHTHALMIC | 3 refills | Status: DC

## 2023-05-16 MED ORDER — SYSTANE 0.4-0.3 % OP GEL: 1.0000 | OPHTHALMIC | 3 refills | Status: AC | PRN

## 2023-05-16 MED ORDER — LOTEPREDNOL ETABONATE 0.5 % OP SUSP: 1.0000 [drp] | Freq: Every day | OPHTHALMIC | 0 refills | Status: DC

## 2023-05-16 NOTE — Progress Notes (Unsigned)
 [place] - [place] - [place] - [place] - [place]   Follow-up Note  Referring Provider: Jackelyn Poling, DO Primary Provider: Jackelyn Poling, DO Date of Office Visit: 05/16/2023  Subjective:   Valerie Snyder (DOB: 24-May-1950) is a 73 y.o. female who [facility] in re-evaluation of the following:  HPI: Georgeanne presents to this clinic in evaluation of asthma, allergic rhinitis, allergic conjunctivitis, reflux, and stinging insect allergy.  I last saw her in this clinic 01 February 2022.  Overall she has done pretty well until the very beginning of March.  At that point in time she developed nasal symptoms with nasal congestion and fullness and runny nose and coughing and just feeling very fatigued and had to use her short acting bronchodilator for which she went to the urgent care center was diagnosed with a virus.  She did not get any better and then she went to another urgent care center was diagnosed with a bacterial infection and given an antibiotic.  Overall her airway issue has improved but she is now left with eye issues.  She has itchy watery eyes and she rubs her eyes and she gets swelling around her eyes.  She is using some Systane.  She is not using any other eyedrops.  She has not really had any significant upper airway symptoms at this point in time.  Specifically, she has not any ugly nasal discharge or headaches or anosmia.  She has very little lower airway symptoms and does not need to use the short acting bronchodilator.  This fall she had to visit her sister up in the [place] about a cat and she started some inhaled steroids prior to that exposure and throughout that exposure and that plan just worked great.  She was able to have a cat exposure without precipitating any asthma issues and without the need for any short acting bronchodilator.  Allergies as of 05/16/2023       Reactions   Bee Venom Anaphylaxis   Cephalexin  Anaphylaxis, Hives   Puffy eyes and lips Other reaction(s): facial swelling   Ketorolac Tromethamine Anaphylaxis   Puffy lips and eyes Other reaction(s): anaphylaxis   Atorvastatin    Other reaction(s): muscle cramps   Rosuvastatin Other (See Comments)   Other reaction(s): muscle cramps Other reaction(s): Other (See Comments)   Simvastatin    Other reaction(s): muscle cramps Other reaction(s): muscle cramps, Other (See Comments) Other reaction(s): muscle cramps        Medication List    acetaminophen 500 MG tablet Commonly known as: TYLENOL Take 500 mg by mouth as needed.   Arnuity Ellipta 200 MCG/ACT Aepb Generic drug: Fluticasone Furoate INHALE 1 PUFF BY MOUTH DAILY   Atrantil Caps 1 capsule Orally Twice a day   Benefiber Powd Take 10 mLs by mouth daily.   cetirizine 10 MG tablet Commonly known as: ZYRTEC Take 1 tablet (10 mg total) by mouth daily as needed for allergies.   cholecalciferol 25 MCG (1000 UNIT) tablet Commonly known as: VITAMIN D3 Take 1,000 Units by mouth daily.   doxycycline 100 MG capsule Commonly known as: VIBRAMYCIN Take 1 capsule (100 mg total) by mouth 2 (two) times daily.   EPINEPHrine 0.3 mg/0.3 mL Soaj injection Commonly known as: EpiPen 2-Pak Inject 0.3 mg into the muscle as needed for anaphylaxis.   fluticasone 50 MCG/ACT nasal spray Commonly known as: FLONASE Place 2 sprays into both nostrils as needed.   hydroxychloroquine 200 MG tablet  Commonly known as: PLAQUENIL Take 200 mg by mouth in the morning and at bedtime.   levothyroxine 75 MCG tablet Commonly known as: SYNTHROID Take 75 mcg by mouth daily before breakfast.   OVER THE COUNTER MEDICATION Alpha Lipotic Acid 300 mg capsule  taking 1 twice a day with meals   pantoprazole 40 MG tablet Commonly known as: PROTONIX Take 1 tablet by mouth daily at 12 noon.   Praluent 150 MG/ML Soaj Generic drug: Alirocumab INJECT UNDER THE SKIN 1 ML EVERY 14 DAYS    promethazine-dextromethorphan 6.25-15 MG/5ML syrup Commonly known as: PROMETHAZINE-DM Take 2.5 mLs by mouth 3 (three) times daily as needed for cough.   Ventolin HFA 108 (90 Base) MCG/ACT inhaler Generic drug: albuterol Inhale 2 puffs into the lungs every 4 (four) hours as needed for wheezing or shortness of breath.    Past Medical History:  Diagnosis Date   Abnormal uterine bleeding (AUB)    Allergy    Asthma    allergy related   Breast cancer of 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  72nd percentile 11/2018.   Complication of anesthesia    10 years ago- pt states she stopped breathing and "had to be bagged"   Concussion 2018   GERD (gastroesophageal reflux disease)    H/O bilateral inguinal hernia repair 1992   Hypercholesteremia    Hypothyroidism    Migraines    Personal history of radiation therapy    Plantar fasciitis, bilateral    Pure hypercholesterolemia 10/10/2018   Radiation 01/12/15-02/04/15   right breast 42.17 gray   Spinal stenosis    Thyroid disease     Past Surgical History:  Procedure Laterality Date   BLADDER SUSPENSION     BLEPHAROPLASTY Bilateral    BREAST BIOPSY Left 01/28/2022   MM LT BREAST BX W LOC DEV 1ST LESION IMAGE BX SPEC STEREO GUIDE 01/28/2022 [facility]   BREAST LUMPECTOMY Right 12/10/2014   BREAST LUMPECTOMY WITH RADIOACTIVE SEED LOCALIZATION Right 12/10/2014   Procedure: RIGHT BREAST LUMPECTOMY WITH RADIOACTIVE SEED LOCALIZATION;  Surgeon: Chevis Pretty III, MD;  Location: [facility];  Service: General;  Laterality: Right;   CHOLECYSTECTOMY N/A 02/20/2021   Procedure: LAPAROSCOPIC CHOLECYSTECTOMY;  Surgeon: Axel Filler, MD;  Location: [facility] OR;  Service: General;  Laterality: N/A;   DILATATION & CURETTAGE/HYSTEROSCOPY WITH MYOSURE N/A 07/15/2020   Procedure: DILATATION & CURETTAGE/HYSTEROSCOPY  Carollee Leitz USING MYOSURE;  Surgeon: Gerald Leitz, MD;  Location: [facility];  Service: Gynecology;  Laterality: N/A;   FOOT SURGERY     plantar fashiectomies from both feet   HERNIA REPAIR     HYSTEROSCOPY WITH D & C N/A 12/27/2017   Procedure: DILATATION AND CURETTAGE /HYSTEROSCOPY;  Surgeon: Myna Hidalgo, DO;  Location: [facility];  Service: Gynecology;  Laterality: N/A;   NASAL SEPTUM SURGERY     neuromas removed from right foot     osteoma  1991   removal from right orbital   POLYPECTOMY  12/27/2017   Procedure: POLYPECTOMY;  Surgeon: Myna Hidalgo, DO;  Location: [facility];  Service: Gynecology;;   removal of basal cell carcinoma (nose)     TONSILECTOMY/ADENOIDECTOMY WITH MYRINGOTOMY     TONSILLECTOMY      Review of systems negative except as noted in HPI / PMHx or noted below:  Review of Systems  Constitutional: Negative.   HENT: Negative.  Eyes: Negative.   Respiratory: Negative.    Cardiovascular: Negative.   Gastrointestinal: Negative.   Genitourinary: Negative.   Musculoskeletal: Negative.   Skin: Negative.   Neurological: Negative.   Endo/Heme/Allergies: Negative.   Psychiatric/Behavioral: Negative.       Objective:   Vitals:   05/16/23 1357  BP: 114/60  Pulse: 76  Resp: 12  Temp: 97.9 F (36.6 C)  SpO2: 98%   Height: 5' 4.17" (163 cm)  Weight: 157 lb 8 oz (71.4 kg)   Physical Exam Constitutional:      Appearance: She is not diaphoretic.  HENT:     Head: Normocephalic.     Right Ear: Tympanic membrane, ear canal and external ear normal.     Left Ear: Tympanic membrane, ear canal and external ear normal.     Nose: Nose normal. No mucosal edema or rhinorrhea.     Mouth/Throat:     Pharynx: Uvula midline. No oropharyngeal exudate.  Eyes:     Conjunctiva/sclera: Conjunctivae normal.  Neck:     Thyroid: No thyromegaly.     Trachea: Trachea normal. No tracheal tenderness or tracheal deviation.   Cardiovascular:     Rate and Rhythm: Normal rate and regular rhythm.     Heart sounds: Normal heart sounds, S1 normal and S2 normal. No murmur heard. Pulmonary:     Effort: No respiratory distress.     Breath sounds: Normal breath sounds. No stridor. No wheezing or rales.  Lymphadenopathy:     Head:     Right side of head: No tonsillar adenopathy.     Left side of head: No tonsillar adenopathy.     Cervical: No cervical adenopathy.  Skin:    Findings: No erythema or rash.     Nails: There is no clubbing.  Neurological:     Mental Status: She is alert.     Diagnostics: Spirometry was performed and demonstrated an FEV1 of 1.85 at 72 % of predicted.   Assessment and Plan:   1. Asthma, mild intermittent, well-controlled   2. Seasonal allergic rhinitis due to pollen   3. Perennial allergic rhinitis   4. Seasonal allergic conjunctivitis   5. Hymenoptera allergy     1. Avoidance measures - dust mite, pollens, cat, dog, stinging insects  2. Continue the following if needed:   A. Zyrtec 10 mg - 1 tablet 1-2 times per day  B. Mucinex  C. Nasal saline followed by Flonase 1 spray in each nostril twice a day  D. Albuterol HFA 2 puffs every 4-6 hours  E. EpiPen  3. Can use Arnuity 200 - 1 inhalation daily starting 1 week prior to and during cat exposure.  4. For this spring event:   A. Never, ever rub / touch eyes  B. Pataday - 1 drop each eye 1 time per day  C. Lotemax - 1 drop each eye 1 time per day  D. Systane - multiple times per day  5. Food testing can be performed when not requiring antihistamines  6. Influenza = Tamiflu. Covid = Paxlovid  7. Return to clinic in 1 year or earlier if problem  There is a plan to help Celesta's eye issue as she goes through the spring as noted above which includes a drop of Pataday and a drop of Lotemax and this should result in very good control of her eye issue and if not she can contact me for further evaluation and we will make a  determination about what exactly what  therapy she will require to have her overcome this spring time pollination season.  If she does well we can see her back in this clinic in 1 year or earlier if there is a problem.  Schuyler Custard, MD Allergy / Immunology [facility]

## 2023-05-16 NOTE — Patient Instructions (Addendum)
  1. Avoidance measures - dust mite, pollens, cat, dog, stinging insects  2. Continue the following if needed:   A. Zyrtec 10 mg - 1 tablet 1-2 times per day  B. Mucinex  C. Nasal saline followed by Flonase 1 spray in each nostril twice a day  D. Albuterol HFA 2 puffs every 4-6 hours  E. EpiPen  3. Can use Arnuity 200 - 1 inhalation daily starting 1 week prior to and during cat exposure.  4. For this spring event:   A. Never, ever rub / touch eyes  B. Pataday - 1 drop each eye 1 time per day  C. Lotemax - 1 drop each eye 1 time per day  D. Systane - multiple times per day  5. Food testing can be performed when not requiring antihistamines  6. Influenza = Tamiflu. Covid = Paxlovid  7. Return to clinic in 1 year or earlier if problem

## 2023-05-17 ENCOUNTER — Encounter: Payer: Self-pay | Admitting: Allergy and Immunology

## 2023-05-19 ENCOUNTER — Other Ambulatory Visit: Payer: Self-pay

## 2023-05-19 ENCOUNTER — Ambulatory Visit
Admission: RE | Admit: 2023-05-19 | Discharge: 2023-05-19 | Disposition: A | Discharge status: Home / Self Care | Source: Ambulatory Visit | Attending: Physician Assistant

## 2023-05-19 VITALS — BP 124/75 | HR 80 | Temp 98.1°F | Resp 18

## 2023-05-19 DIAGNOSIS — J302 Other seasonal allergic rhinitis: Secondary | ICD-10-CM

## 2023-05-19 DIAGNOSIS — J069 Acute upper respiratory infection, unspecified: Secondary | ICD-10-CM | POA: Diagnosis not present

## 2023-05-19 LAB — POC COVID19/FLU A&B COMBO
Covid Antigen, POC: NEGATIVE
Influenza A Antigen, POC: NEGATIVE
Influenza B Antigen, POC: NEGATIVE

## 2023-05-19 LAB — POC RSV: RSV Antigen, POC: NEGATIVE

## 2023-05-19 LAB — POCT RAPID STREP A (OFFICE): Rapid Strep A Screen: NEGATIVE

## 2023-05-19 NOTE — Discharge Instructions (Addendum)
 Your testing was negative for Strep, Flu A &B and COVID, and RSV It is difficult to say whether or not your symptoms are due to your allergies or if you have a viral illness.  At this time I recommend continuing with your allergy  regimen as these medications would provide symptom relief for allergies as well as a viral illness.  If at any point you start to develop significant trouble breathing, wheezing, fever or chills that are not responding to medications, confusion, passing out please go to the emergency room as these can be signs of medical emergency.

## 2023-05-19 NOTE — ED Provider Notes (Signed)
 [facility]    CSN: [id number] Arrival date & time: 05/19/23  1457      History   Chief Complaint Chief Complaint  Patient presents with   Chills    Entered by patient   Generalized Body Aches    HPI Valerie Snyder is a 73 y.o. female.   HPI  She reports seeing her allergy  specialist a few days ago and was told to continue her regimen as directed She reports she is having severe allergy  symptoms from seasonal allergies  She states since she was seen by allergist she has been having chills, brown nasal mucus, headache, and sinus pressure She has been having these symptoms since 05/16/23 and is concerned for potential infection    Past Medical History:  Diagnosis Date   Abnormal uterine bleeding (AUB)    Allergy     Asthma    allergy  related   Breast cancer of 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  72nd percentile 11/2018.   Complication of anesthesia    10 years ago- pt states she stopped breathing and "had to be bagged"   Concussion 2018   GERD (gastroesophageal reflux disease)    H/O bilateral inguinal hernia repair 1992   Hypercholesteremia    Hypothyroidism    Migraines    Personal history of radiation therapy    Plantar fasciitis, bilateral    Pure hypercholesterolemia 10/10/2018   Radiation 01/12/15-02/04/15   right breast 42.17 gray   Spinal stenosis    Thyroid  disease     Patient Active Problem List   Diagnosis Date Noted   Seasonal allergic rhinitis due to pollen 11/03/2022   Perennial allergic rhinitis 11/03/2022   Gastroesophageal reflux disease 11/03/2022   Hymenoptera allergy  11/03/2022   Cholecystitis 02/19/2021   CAD in native artery 01/15/2019   Pure hypercholesterolemia 10/10/2018   Genetic testing 12/10/2014   Breast cancer of 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 breast (HCC) 11/13/2014   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   Asthma, well controlled 10/22/2014   History of basal cell cancer 03/21/2014   Visit for wound check 07/29/2013   Visit for suture removal 07/11/2013   Basal cell carcinoma of nasal tip 07/04/2013   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   Neoplasm of uncertain behavior of skin 06/19/2013   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   Lentigo 12/02/2011   Melanocytic nevi of trunk 12/02/2011   SK (seborrheic keratosis) 12/02/2011   BCC (basal cell carcinoma) 01/29/1898    Past Surgical History:  Procedure Laterality Date   BLADDER SUSPENSION     BLEPHAROPLASTY Bilateral    BREAST BIOPSY Left 01/28/2022   MM LT BREAST BX W LOC DEV 1ST LESION IMAGE BX SPEC STEREO GUIDE 01/28/2022 [facility]   BREAST LUMPECTOMY Right 12/10/2014   BREAST LUMPECTOMY WITH RADIOACTIVE SEED LOCALIZATION Right 12/10/2014   Procedure: RIGHT BREAST LUMPECTOMY WITH RADIOACTIVE SEED LOCALIZATION;  Surgeon: Lillette Reid III, MD;  Location: [facility];  Service: General;  Laterality: Right;   CHOLECYSTECTOMY N/A 02/20/2021   Procedure: LAPAROSCOPIC CHOLECYSTECTOMY;  Surgeon: Shela Derby, MD;  Location: [facility] OR;  Service: General;  Laterality: N/A;   DILATATION & CURETTAGE/HYSTEROSCOPY WITH MYOSURE N/A 07/15/2020   Procedure: DILATATION & CURETTAGE/HYSTEROSCOPY WITHPOLYPECTOMY USING MYOSURE;  Surgeon: Arlee Lace, MD;  Location: [facility];  Service: Gynecology;  Laterality: N/A;   FOOT SURGERY     plantar fashiectomies from both  feet   HERNIA REPAIR     HYSTEROSCOPY WITH D & C N/A 12/27/2017   Procedure: DILATATION AND CURETTAGE /HYSTEROSCOPY;  Surgeon: Ozan, Jennifer, DO;  Location: [facility];  Service: Gynecology;  Laterality: N/A;   NASAL SEPTUM SURGERY     neuromas removed from right foot     osteoma  1991   removal from right orbital   POLYPECTOMY  12/27/2017   Procedure: POLYPECTOMY;  Surgeon: Keene Pastures, DO;  Location: [facility];  Service: Gynecology;;   removal of basal cell carcinoma (nose)     TONSILECTOMY/ADENOIDECTOMY WITH MYRINGOTOMY     TONSILLECTOMY      OB History   No obstetric history on file.      Home Medications    Prior to Admission medications   Medication Sig Start Date End Date Taking? Authorizing Provider  acetaminophen  (TYLENOL ) 500 MG tablet Take 500 mg by mouth as needed. 04/23/22   [provider]  Alirocumab  (PRALUENT ) 150 MG/ML SOAJ INJECT UNDER THE SKIN 1 ML EVERY 14 DAYS 04/25/23   Maudine Sos, MD  ARNUITY ELLIPTA  200 MCG/ACT AEPB INHALE 1 PUFF BY MOUTH DAILY 05/01/23   Tinnie Forehand, FNP  cetirizine  (ZYRTEC ) 10 MG tablet Take 1 tablet (10 mg total) by mouth daily as needed for allergies (Can take an extra dose during flare ups.). 05/16/23   Kozlow, Eric J, MD  cholecalciferol (VITAMIN D3) 25 MCG (1000 UNIT) tablet Take 1,000 Units by mouth daily.    [provider]  doxycycline  (VIBRAMYCIN ) 100 MG capsule Take 1 capsule (100 mg total) by mouth 2 (two) times daily. 04/25/23   Adolph Hoop, PA-C  EPINEPHrine  (EPIPEN  2-PAK) 0.3 mg/0.3 mL IJ SOAJ injection Inject 0.3 mg into the muscle as needed for anaphylaxis. 05/16/23   Kozlow, Rema Care, MD  fluticasone  (FLONASE ) 50 MCG/ACT nasal spray Place 2 sprays into both nostrils as needed. 05/16/23   Kozlow, Eric J, MD  hydroxychloroquine (PLAQUENIL) 200 MG tablet Take 200 mg by mouth in the morning and at bedtime. 11/27/19   [provider]  levothyroxine  (SYNTHROID ) 75 MCG tablet Take 75 mcg by mouth daily before breakfast. 12/20/19   [provider]  loteprednol  (LOTEMAX ) 0.5 % ophthalmic suspension Place 1 drop into both eyes daily. 05/16/23   Kozlow, Rema Care, MD  Misc Natural Products (ATRANTIL) CAPS 1 capsule Orally Twice a day    [provider]  Olopatadine  HCl (PATADAY ) 0.2 % SOLN Place 1 drop into both eyes 1 day or 1 dose. 05/16/23   Kozlow, Rema Care, MD  OVER THE COUNTER MEDICATION  Alpha Lipotic Acid 300 mg capsule  taking 1 twice a day with meals    [provider]  pantoprazole  (PROTONIX ) 40 MG tablet Take 1 tablet by mouth daily at 12 noon. 06/17/21   [provider]  Polyethyl Glycol-Propyl Glycol (SYSTANE) 0.4-0.3 % GEL ophthalmic gel Place 1 Application into both eyes every 4 (four) hours as needed. 05/16/23   Kozlow, Rema Care, MD  predniSONE  (DELTASONE ) 20 MG tablet Take 1 tablet (20 mg total) by mouth daily with breakfast. 04/25/23   Adolph Hoop, PA-C  promethazine -dextromethorphan (PROMETHAZINE -DM) 6.25-15 MG/5ML syrup Take 2.5 mLs by mouth 3 (three) times daily as needed for cough. 04/01/23   Adolph Hoop, PA-C  VENTOLIN  HFA 108 (90 Base) MCG/ACT inhaler Inhale 2 puffs into the lungs every 4 (four) hours as needed for wheezing or shortness of breath. 05/16/23   Kozlow, Rema Care, MD  Wheat Dextrin (BENEFIBER) POWD Take 10 mLs by mouth daily.    [provider]    Family History Family History  Problem Relation Age of Onset   Pancreatic cancer Maternal Aunt        dx. 40s-early 53s   Congestive Heart Failure Mother 38   Lung disease Mother    Colon polyps Mother        unspecified number   Dementia Father        Lewy Body Dementia   Kidney failure Father    Stroke Father    Depression Father    Multiple sclerosis Sister    Depression Sister    Aneurysm Maternal Grandmother        brain   Heart attack Maternal Grandfather    Emphysema Paternal Grandfather    Heart Problems Paternal Grandfather    Other Sister        3 sisters have history of cysts in uterus   Depression Sister    Breast cancer Cousin        dx. 32-50   Testicular cancer Cousin        dx. 30s   Colon polyps Daughter        unspecified number    Social History Social History   Tobacco Use   Smoking status: Never   Smokeless tobacco: Never  Vaping Use   Vaping status: Never Used  Substance Use Topics   Alcohol use: No   Drug use: No     Allergies   Bee  venom, Cephalexin, Ketorolac  tromethamine , Atorvastatin, Rosuvastatin, and Simvastatin   Review of Systems Review of Systems  Constitutional:  Positive for chills. Negative for fever.  HENT:  Positive for congestion, postnasal drip, sinus pressure and sinus pain. Negative for sore throat.   Respiratory:  Positive for cough and wheezing. Negative for chest tightness and shortness of breath.   Gastrointestinal:  Negative for diarrhea, nausea and vomiting.  Musculoskeletal:  Positive for myalgias.  Neurological:  Positive for headaches.     Physical Exam Triage Vital Signs ED Triage Vitals [05/19/23 1528]  Encounter Vitals Group     BP 124/75     Systolic BP Percentile      Diastolic BP Percentile      Pulse Rate 80     Resp 18     Temp 98.1 F (36.7 C)     Temp Source Oral     SpO2 95 %     Weight      Height      Head Circumference      Peak Flow      Pain Score 6     Pain Loc      Pain Education      Exclude from Growth Chart    No data found.  Updated Vital Signs BP 124/75 (BP Location: Right Arm)   Pulse 80   Temp 98.1 F (36.7 C) (Oral)   Resp 18   SpO2 95%   Visual Acuity Right Eye Distance:   Left Eye Distance:   Bilateral Distance:    Right Eye Near:   Left Eye Near:    Bilateral Near:     Physical Exam Vitals reviewed.  Constitutional:      General: She is awake.     Appearance: Normal appearance. She is well-developed and well-groomed.  HENT:     Head: Normocephalic and atraumatic.     Right Ear: Hearing, tympanic membrane and ear canal normal.  Left Ear: Hearing, tympanic membrane and ear canal normal.     Mouth/Throat:     Lips: Pink.     Mouth: Mucous membranes are moist.     Pharynx: Oropharynx is clear. Uvula midline. No pharyngeal swelling, oropharyngeal exudate, posterior oropharyngeal erythema, uvula swelling or postnasal drip.  Cardiovascular:     Rate and Rhythm: Normal rate and regular rhythm.     Pulses: Normal pulses.           Radial pulses are 2+ on the right side and 2+ on the left side.     Heart sounds: Normal heart sounds. No murmur heard.    No friction rub. No gallop.  Pulmonary:     Effort: Pulmonary effort is normal.     Breath sounds: Normal breath sounds. No decreased air movement. No decreased breath sounds, wheezing, rhonchi or rales.  Musculoskeletal:     Cervical back: Normal range of motion and neck supple.  Lymphadenopathy:     Head:     Right side of head: No submental, submandibular or preauricular adenopathy.     Left side of head: No submental, submandibular or preauricular adenopathy.     Cervical:     Right cervical: No superficial cervical adenopathy.    Left cervical: No superficial cervical adenopathy.     Upper Body:     Right upper body: No supraclavicular adenopathy.     Left upper body: No supraclavicular adenopathy.  Neurological:     General: No focal deficit present.     Mental Status: She is alert and oriented to person, place, and time.  Psychiatric:        Attention and Perception: Attention and perception normal.        Mood and Affect: Mood and affect normal.        Speech: Speech normal.        Behavior: Behavior normal. Behavior is cooperative.        Thought Content: Thought content normal.        Judgment: Judgment normal.      UC Treatments / Results  Labs (all labs ordered are listed, but only abnormal results are displayed) Labs Reviewed  POC COVID19/FLU A&B COMBO - Normal  POCT RAPID STREP A (OFFICE) - Normal  POC RSV - Normal    EKG   Radiology No results found.  Procedures Procedures (including critical care time)  Medications Ordered in UC Medications - No data to display  Initial Impression / Assessment and Plan / UC Course  I have reviewed the triage vital signs and the nursing notes.  Pertinent labs & imaging results that were available during my care of the patient were reviewed by me and considered in my medical decision  making (see chart for details).      Final Clinical Impressions(s) / UC Diagnoses   Final diagnoses:  Upper respiratory tract infection, unspecified type  Seasonal allergies   Patient presents today with concerns of chills, nasal congestion, postnasal drainage, coughing and bodyaches for the past few days.  She reports that she has been taking her allergy  regimen as directed by her allergy  specialist but her symptoms have not been improving.  She is concerned that she may have viral infection and would like testing today.  Rapid flu, COVID, strep and RSV test were all negative.  These results were discussed with patient during her appointment.  At this time is difficult to say whether or not her symptoms are due to an allergy   exacerbation versus viral illness.  At this time recommend continued use of her allergy  regimen and close monitoring for more severe symptoms.  Her medication regimen already includes Mucinex, antihistamines and nasal sprays which would likely provide relief for viral illness as well.  ED and return precautions were reviewed and provided in after visit summary.  Follow-up as needed.    Discharge Instructions      Your testing was negative for Strep, Flu A &B and COVID, and RSV It is difficult to say whether or not your symptoms are due to your allergies or if you have a viral illness.  At this time I recommend continuing with your allergy  regimen as these medications would provide symptom relief for allergies as well as a viral illness.  If at any point you start to develop significant trouble breathing, wheezing, fever or chills that are not responding to medications, confusion, passing out please go to the emergency room as these can be signs of medical emergency.       ED Prescriptions   None    PDMP not reviewed this encounter.   Jerona Mooring, PA-C 05/19/23 1717

## 2023-05-19 NOTE — ED Triage Notes (Signed)
 Pt presents with complaints of chills, fatigue, sensitivity to touch, generalized muscle aches, headaches, and coughing up red/yellow mucus x 3 days. Pt denies fevers at home.   Pt currently rates her overall pain a 6/10. Pt is requesting a COVID + Flu test this visit. Zyrtec , Mucinex, and nasal saline taken for symptoms with no improvement.

## 2023-05-20 ENCOUNTER — Ambulatory Visit
Admission: EM | Admit: 2023-05-20 | Discharge: 2023-05-20 | Disposition: A | Discharge status: Home / Self Care | Attending: Family Medicine | Admitting: Family Medicine

## 2023-05-20 DIAGNOSIS — J0101 Acute recurrent maxillary sinusitis: Secondary | ICD-10-CM

## 2023-05-20 MED ORDER — DOXYCYCLINE HYCLATE 100 MG PO CAPS: 100.0000 mg | ORAL_CAPSULE | Freq: Two times a day (BID) | ORAL | 0 refills | Status: DC

## 2023-05-20 NOTE — ED Triage Notes (Signed)
 Pt presents with c/o rt eye soreness. Pt states she has seasonal allergies. Has been using eye drops along with zyrtec . Pt states her eyes have worsened.

## 2023-05-20 NOTE — ED Provider Notes (Signed)
 [facility]    CSN: [id number] Arrival date & time: 05/20/23  1425      History   Chief Complaint Chief Complaint  Patient presents with   Nasal Congestion    HPI Valerie Snyder is a 73 y.o. female  presents for evaluation of URI symptoms for 7 days. Patient reports associated symptoms of right sided maxillary sinus pressure/pain with swelling and aching teeth/eye pressure. Denies N/V/D, fevers, sore throat, cough, body aches, shortness of breath. 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  Has not inhaler but has not needed to use since symptom onset.  Patient is not an active smoker.   She did go to urgent care yesterday for similar symptoms.  She had a negative rapid COVID, flu, RSV, and strep testing.  She was advised to continue her allergy  medications.  Patient report her sinus pressure has worsened.   Pt has no other concerns at this time.   HPI  Past Medical History:  Diagnosis Date   Abnormal uterine bleeding (AUB)    Allergy     Asthma    allergy  related   Breast cancer of 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  72nd percentile 11/2018.   Complication of anesthesia    10 years ago- pt states she stopped breathing and "had to be bagged"   Concussion 2018   GERD (gastroesophageal reflux disease)    H/O bilateral inguinal hernia repair 1992   Hypercholesteremia    Hypothyroidism    Migraines    Personal history of radiation therapy    Plantar fasciitis, bilateral    Pure hypercholesterolemia 10/10/2018   Radiation 01/12/15-02/04/15   right breast 42.17 gray   Spinal stenosis    Thyroid  disease     Patient Active Problem List   Diagnosis Date Noted   Seasonal allergic rhinitis due to pollen 11/03/2022   Perennial allergic rhinitis 11/03/2022   Gastroesophageal reflux disease 11/03/2022   Hymenoptera allergy  11/03/2022    Cholecystitis 02/19/2021   CAD in native artery 01/15/2019   Pure hypercholesterolemia 10/10/2018   Genetic testing 12/10/2014   Breast cancer of 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 breast (HCC) 11/13/2014   Allergic rhinoconjunctivitis 10/22/2014   Asthma, well controlled 10/22/2014   History of basal cell cancer 03/21/2014   Visit for wound check 07/29/2013   Visit for suture removal 07/11/2013   Basal cell carcinoma of nasal tip 07/04/2013   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   Neoplasm of uncertain behavior of skin 06/19/2013   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   Lentigo 12/02/2011   Melanocytic nevi of trunk 12/02/2011   SK (seborrheic keratosis) 12/02/2011   BCC (basal cell carcinoma) 01/29/1898    Past Surgical History:  Procedure Laterality Date   BLADDER SUSPENSION     BLEPHAROPLASTY Bilateral    BREAST BIOPSY Left 01/28/2022   MM LT BREAST BX W LOC DEV 1ST LESION IMAGE BX SPEC STEREO GUIDE 01/28/2022 [facility]   BREAST LUMPECTOMY Right 12/10/2014   BREAST LUMPECTOMY WITH RADIOACTIVE SEED LOCALIZATION Right 12/10/2014   Procedure: RIGHT BREAST LUMPECTOMY WITH RADIOACTIVE SEED LOCALIZATION;  Surgeon: Lillette Reid III, MD;  Location: [facility];  Service: General;  Laterality: Right;   CHOLECYSTECTOMY N/A 02/20/2021   Procedure: LAPAROSCOPIC CHOLECYSTECTOMY;  Surgeon: Shela Derby, MD;  Location: [facility];  Service: General;  Laterality: N/A;   DILATATION & CURETTAGE/HYSTEROSCOPY WITH MYOSURE N/A  07/15/2020   Procedure: DILATATION & CURETTAGE/HYSTEROSCOPY WITHPOLYPECTOMY USING MYOSURE;  Surgeon: Arlee Lace, MD;  Location: [facility];  Service: Gynecology;  Laterality: N/A;   FOOT SURGERY     plantar fashiectomies from both feet   HERNIA REPAIR     HYSTEROSCOPY WITH D & C N/A 12/27/2017   Procedure: DILATATION AND CURETTAGE /HYSTEROSCOPY;  Surgeon: Keene Pastures, DO;  Location: [facility];  Service: Gynecology;  Laterality: N/A;    NASAL SEPTUM SURGERY     neuromas removed from right foot     osteoma  1991   removal from right orbital   POLYPECTOMY  12/27/2017   Procedure: POLYPECTOMY;  Surgeon: Keene Pastures, DO;  Location: [facility];  Service: Gynecology;;   removal of basal cell carcinoma (nose)     TONSILECTOMY/ADENOIDECTOMY WITH MYRINGOTOMY     TONSILLECTOMY      OB History   No obstetric history on file.      Home Medications    Prior to Admission medications   Medication Sig Start Date End Date Taking? Authorizing Provider  doxycycline  (VIBRAMYCIN ) 100 MG capsule Take 1 capsule (100 mg total) by mouth 2 (two) times daily. 05/20/23  Yes Naylee Frankowski, Jodi R, NP  acetaminophen  (TYLENOL ) 500 MG tablet Take 500 mg by mouth as needed. 04/23/22   [provider]  Alirocumab  (PRALUENT ) 150 MG/ML SOAJ INJECT UNDER THE SKIN 1 ML EVERY 14 DAYS 04/25/23   Maudine Sos, MD  ARNUITY ELLIPTA  200 MCG/ACT AEPB INHALE 1 PUFF BY MOUTH DAILY 05/01/23   Tinnie Forehand, FNP  cetirizine  (ZYRTEC ) 10 MG tablet Take 1 tablet (10 mg total) by mouth daily as needed for allergies (Can take an extra dose during flare ups.). 05/16/23   Kozlow, Eric J, MD  cholecalciferol (VITAMIN D3) 25 MCG (1000 UNIT) tablet Take 1,000 Units by mouth daily.    [provider]  EPINEPHrine  (EPIPEN  2-PAK) 0.3 mg/0.3 mL IJ SOAJ injection Inject 0.3 mg into the muscle as needed for anaphylaxis. 05/16/23   Kozlow, Rema Care, MD  fluticasone  (FLONASE ) 50 MCG/ACT nasal spray Place 2 sprays into both nostrils as needed. 05/16/23   Kozlow, Eric J, MD  hydroxychloroquine (PLAQUENIL) 200 MG tablet Take 200 mg by mouth in the morning and at bedtime. 11/27/19   [provider]  levothyroxine  (SYNTHROID ) 75 MCG tablet Take 75 mcg by mouth daily before breakfast. 12/20/19   [provider]  loteprednol  (LOTEMAX ) 0.5 % ophthalmic suspension Place 1 drop into both eyes daily. 05/16/23   Kozlow, Rema Care, MD  Misc Natural  Products (ATRANTIL) CAPS 1 capsule Orally Twice a day    [provider]  Olopatadine  HCl (PATADAY ) 0.2 % SOLN Place 1 drop into both eyes 1 day or 1 dose. 05/16/23   Kozlow, Rema Care, MD  OVER THE COUNTER MEDICATION Alpha Lipotic Acid 300 mg capsule  taking 1 twice a day with meals    [provider]  pantoprazole  (PROTONIX ) 40 MG tablet Take 1 tablet by mouth daily at 12 noon. 06/17/21   [provider]  Polyethyl Glycol-Propyl Glycol (SYSTANE) 0.4-0.3 % GEL ophthalmic gel Place 1 Application into both eyes every 4 (four) hours as needed. 05/16/23   Kozlow, Rema Care, MD  predniSONE  (DELTASONE ) 20 MG tablet Take 1 tablet (20 mg total) by mouth daily with breakfast. 04/25/23   Adolph Hoop, PA-C  promethazine -dextromethorphan (PROMETHAZINE -DM) 6.25-15 MG/5ML syrup Take 2.5 mLs by mouth 3 (three) times daily as needed for  cough. 04/01/23   Adolph Hoop, PA-C  VENTOLIN  HFA 108 (90 Base) MCG/ACT inhaler Inhale 2 puffs into the lungs every 4 (four) hours as needed for wheezing or shortness of breath. 05/16/23   Kozlow, Rema Care, MD  Wheat Dextrin (BENEFIBER) POWD Take 10 mLs by mouth daily.    [provider]    Family History Family History  Problem Relation Age of Onset   Pancreatic cancer Maternal Aunt        dx. 40s-early 48s   Congestive Heart Failure Mother 62   Lung disease Mother    Colon polyps Mother        unspecified number   Dementia Father        Lewy Body Dementia   Kidney failure Father    Stroke Father    Depression Father    Multiple sclerosis Sister    Depression Sister    Aneurysm Maternal Grandmother        brain   Heart attack Maternal Grandfather    Emphysema Paternal Grandfather    Heart Problems Paternal Grandfather    Other Sister        3 sisters have history of cysts in uterus   Depression Sister    Breast cancer Cousin        dx. 30-50   Testicular cancer Cousin        dx. 30s   Colon polyps Daughter        unspecified number     Social History Social History   Tobacco Use   Smoking status: Never   Smokeless tobacco: Never  Vaping Use   Vaping status: Never Used  Substance Use Topics   Alcohol use: No   Drug use: No     Allergies   Bee venom, Cephalexin, Ketorolac  tromethamine , Atorvastatin, Rosuvastatin, and Simvastatin   Review of Systems Review of Systems  HENT:  Positive for congestion, sinus pressure and sinus pain.      Physical Exam Triage Vital Signs ED Triage Vitals [05/20/23 1442]  Encounter Vitals Group     BP 111/70     Systolic BP Percentile      Diastolic BP Percentile      Pulse Rate 75     Resp 18     Temp 98.3 F (36.8 C)     Temp Source Oral     SpO2 95 %     Weight      Height      Head Circumference      Peak Flow      Pain Score 3     Pain Loc      Pain Education      Exclude from Growth Chart    No data found.  Updated Vital Signs BP 111/70 (BP Location: Left Arm)   Pulse 75   Temp 98.3 F (36.8 C) (Oral)   Resp 18   SpO2 95%   Visual Acuity Right Eye Distance:   Left Eye Distance:   Bilateral Distance:    Right Eye Near:   Left Eye Near:    Bilateral Near:     Physical Exam Vitals and nursing note reviewed.  Constitutional:      General: She is not in acute distress.    Appearance: She is well-developed. She is not ill-appearing.  HENT:     Head: Normocephalic and atraumatic.     Right Ear: Tympanic membrane and ear canal normal.     Left Ear: Tympanic membrane and  ear canal normal.     Nose: Congestion present.     Right Turbinates: Swollen and pale.     Left Turbinates: Not swollen or pale.     Right Sinus: Maxillary sinus tenderness present. No frontal sinus tenderness.     Left Sinus: No maxillary sinus tenderness or frontal sinus tenderness.     Mouth/Throat:     Mouth: Mucous membranes are moist.     Pharynx: Oropharynx is clear. Uvula midline. No oropharyngeal exudate or posterior oropharyngeal erythema.     Tonsils: No  tonsillar exudate or tonsillar abscesses.  Eyes:     Conjunctiva/sclera: Conjunctivae normal.     Pupils: Pupils are equal, round, and reactive to light.  Cardiovascular:     Rate and Rhythm: Normal rate and regular rhythm.     Heart sounds: Normal heart sounds.  Pulmonary:     Effort: Pulmonary effort is normal.     Breath sounds: Normal breath sounds. No wheezing, rhonchi or rales.  Musculoskeletal:     Cervical back: Normal range of motion and neck supple.  Lymphadenopathy:     Cervical: No cervical adenopathy.  Skin:    General: Skin is warm and dry.  Neurological:     General: No focal deficit present.     Mental Status: She is alert and oriented to person, place, and time.  Psychiatric:        Mood and Affect: Mood normal.        Behavior: Behavior normal.      UC Treatments / Results  Labs (all labs ordered are listed, but only abnormal results are displayed) Labs Reviewed - No data to display  EKG   Radiology No results found.  Procedures Procedures (including critical care time)  Medications Ordered in UC Medications - No data to display  Initial Impression / Assessment and Plan / UC Course  I have reviewed the triage vital signs and the nursing notes.  Pertinent labs & imaging results that were available during my care of the patient were reviewed by me and considered in my medical decision making (see chart for details).     Reviewed exam and symptoms with patient.  Will start doxycycline  twice daily for 10 days.  She is to continue her allergy  medication regimen as well as sinus rinses as needed.  PCP follow-up 2 to 3 days for recheck.  ER precautions reviewed and patient verbalized understanding. Final Clinical Impressions(s) / UC Diagnoses   Final diagnoses:  Acute recurrent maxillary sinusitis     Discharge Instructions      Start doxycycline  twice daily for 10 days.  Continue your Flonase , allergy  medicine, and allergy  eyedrops as  previously prescribed.  Lots of rest and fluids.  Please follow-up with your PCP in 2 to 3 days for recheck.  Please go to the ER for any worsening symptoms.  Hope you feel better soon!    ED Prescriptions     Medication Sig Dispense Auth. Provider   doxycycline  (VIBRAMYCIN ) 100 MG capsule Take 1 capsule (100 mg total) by mouth 2 (two) times daily. 20 capsule Alette Kataoka, Jodi R, NP      PDMP not reviewed this encounter.   Alleen Arbour, NP 05/20/23 [phone]

## 2023-05-20 NOTE — Discharge Instructions (Addendum)
 Start doxycycline  twice daily for 10 days.  Continue your Flonase , allergy  medicine, and allergy  eyedrops as previously prescribed.  Lots of rest and fluids.  Please follow-up with your PCP in 2 to 3 days for recheck.  Please go to the ER for any worsening symptoms.  Hope you feel better soon!

## 2023-05-22 ENCOUNTER — Other Ambulatory Visit (HOSPITAL_BASED_OUTPATIENT_CLINIC_OR_DEPARTMENT_OTHER): Payer: Self-pay | Admitting: Cardiovascular Disease

## 2023-05-22 DIAGNOSIS — E7800 Pure hypercholesterolemia, unspecified: Secondary | ICD-10-CM

## 2023-05-22 DIAGNOSIS — I2510 Atherosclerotic heart disease of native coronary artery without angina pectoris: Secondary | ICD-10-CM

## 2023-05-25 ENCOUNTER — Telehealth: Payer: Self-pay | Admitting: Allergy and Immunology

## 2023-05-25 NOTE — Telephone Encounter (Signed)
 Carle called and wants someone to call her about the length of time she should be using the medications that she was prescribed on her 4/15 visit.

## 2023-05-26 ENCOUNTER — Encounter: Payer: Self-pay | Admitting: Neurology

## 2023-05-29 NOTE — Telephone Encounter (Signed)
Patient advised of directions.

## 2023-05-29 NOTE — Telephone Encounter (Signed)
 Patient called and stated that she would like a call back to let her know when she can stop the current medications that she is taking from 4/15. Patient left 3 numbers that she can be reached on [phone] or [phone] or [phone]

## 2023-05-31 DIAGNOSIS — E039 Hypothyroidism, unspecified: Secondary | ICD-10-CM | POA: Diagnosis not present

## 2023-05-31 DIAGNOSIS — I2510 Atherosclerotic heart disease of native coronary artery without angina pectoris: Secondary | ICD-10-CM | POA: Diagnosis not present

## 2023-05-31 DIAGNOSIS — E7800 Pure hypercholesterolemia, unspecified: Secondary | ICD-10-CM | POA: Diagnosis not present

## 2023-06-21 ENCOUNTER — Ambulatory Visit

## 2023-06-21 NOTE — Progress Notes (Signed)
 Insoles fit into shoes once insole was removed  Britton Cane

## 2023-07-01 DIAGNOSIS — E039 Hypothyroidism, unspecified: Secondary | ICD-10-CM | POA: Diagnosis not present

## 2023-07-01 DIAGNOSIS — E7800 Pure hypercholesterolemia, unspecified: Secondary | ICD-10-CM | POA: Diagnosis not present

## 2023-07-01 DIAGNOSIS — I2510 Atherosclerotic heart disease of native coronary artery without angina pectoris: Secondary | ICD-10-CM | POA: Diagnosis not present

## 2023-07-03 ENCOUNTER — Encounter: Payer: Self-pay | Admitting: Neurology

## 2023-07-03 ENCOUNTER — Ambulatory Visit (INDEPENDENT_AMBULATORY_CARE_PROVIDER_SITE_OTHER): Admitting: Neurology

## 2023-07-03 VITALS — BP 127/69 | HR 75 | Ht 65.0 in | Wt 154.0 lb

## 2023-07-03 DIAGNOSIS — R29898 Other symptoms and signs involving the musculoskeletal system: Secondary | ICD-10-CM | POA: Diagnosis not present

## 2023-07-03 DIAGNOSIS — R292 Abnormal reflex: Secondary | ICD-10-CM

## 2023-07-03 NOTE — Progress Notes (Signed)
 [facility] Neurology Division Clinic Note - Initial Visit   Date: 07/03/2023   Sheena Donegan MRN: [id number] DOB: 1951-01-22   Dear Dr. Irine Manning:  Thank you for your kind referral of Azhia Hellinger for consultation of small fiber neuropathy. Although her history is well known to you, please allow us  to reiterate it for the purpose of our medical record. The patient was accompanied to the clinic by self.    Kailyn Adelstein is a 73 y.o. 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   IMPRESSION/PLAN: Exertional leg heaviness concerning for neurogenic claudication.  She also has hyperesthesia over the left lateral leg, which can be seen with L5 radiculopathy.   - MRI lumbar spine wo contrast  2.  Small fiber neuropathy, she reports having biopsy by podiatry, but I am unable to locate the pathology results in Epic.  She currently denies numbness, tingling, or painful paresthesias.  Further recommendations pending results.   ------------------------------------------------------------- History of present illness: Starting around late 2024, she began having sensation of bilateral leg heaviness, which is worse with prolonged activity.  Despite this, she trying to stay active.  She denies leg weakness or imbalance. No numbness or tingling.  She has soreness/burning over the left lateral lower leg.  She had prior NCS/EMG of the left leg in 2022 which was normal and had skin biopsy for small fiber neuropathy with podiatry, which showed neuropathy (pathology results not available).   Out-side paper records, electronic medical record, and images have been reviewed where available and summarized as:  NCS/EMG of the left leg 12/17/2020: This is a normal study of the left lower extremity.  In particular, there is no evidence of tarsal tunnel syndrome,  sensorimotor polyneuropathy, or a lumbosacral radiculopathy.   Lab Results  Component Value Date   VITAMINB12 674 01/19/2021    Past Medical History:  Diagnosis Date   Abnormal uterine bleeding (AUB)    Allergy     Asthma    allergy  related   Breast cancer of 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  72nd percentile 11/2018.   Complication of anesthesia    10 years ago- pt states she stopped breathing and "had to be bagged"   Concussion 2018   GERD (gastroesophageal reflux disease)    H/O bilateral inguinal hernia repair 1992   Hypercholesteremia    Hypothyroidism    Migraines    Personal history of radiation therapy    Plantar fasciitis, bilateral    Pure hypercholesterolemia 10/10/2018   Radiation 01/12/15-02/04/15   right breast 42.17 gray   Spinal stenosis    Thyroid  disease     Past Surgical History:  Procedure Laterality Date   BLADDER SUSPENSION     BLEPHAROPLASTY Bilateral    BREAST BIOPSY Left 01/28/2022   MM LT BREAST BX W LOC DEV 1ST LESION IMAGE BX SPEC STEREO GUIDE 01/28/2022 [facility]   BREAST LUMPECTOMY Right 12/10/2014   BREAST LUMPECTOMY WITH RADIOACTIVE SEED LOCALIZATION Right 12/10/2014   Procedure: RIGHT BREAST LUMPECTOMY WITH RADIOACTIVE SEED LOCALIZATION;  Surgeon: Lillette Reid III, MD;  Location: [facility];  Service: General;  Laterality: Right;   CHOLECYSTECTOMY N/A 02/20/2021   Procedure: LAPAROSCOPIC CHOLECYSTECTOMY;  Surgeon: Shela Derby, MD;  Location: [facility];  Service: General;  Laterality: N/A;   DILATATION & CURETTAGE/HYSTEROSCOPY WITH MYOSURE N/A  07/15/2020   Procedure: DILATATION & CURETTAGE/HYSTEROSCOPY WITHPOLYPECTOMY USING MYOSURE;  Surgeon: Arlee Lace, MD;  Location: [facility];  Service: Gynecology;  Laterality: N/A;   FOOT SURGERY     plantar fashiectomies from both feet   HERNIA  REPAIR     HYSTEROSCOPY WITH D & C N/A 12/27/2017   Procedure: DILATATION AND CURETTAGE /HYSTEROSCOPY;  Surgeon: Keene Pastures, DO;  Location: [facility];  Service: Gynecology;  Laterality: N/A;   NASAL SEPTUM SURGERY     neuromas removed from right foot     osteoma  1991   removal from right orbital   POLYPECTOMY  12/27/2017   Procedure: POLYPECTOMY;  Surgeon: Keene Pastures, DO;  Location: [facility];  Service: Gynecology;;   removal of basal cell carcinoma (nose)     TONSILECTOMY/ADENOIDECTOMY WITH MYRINGOTOMY     TONSILLECTOMY       Medications:  Outpatient Encounter Medications as of 07/03/2023  Medication Sig   acetaminophen  (TYLENOL ) 500 MG tablet Take 500 mg by mouth as needed.   Alirocumab  (PRALUENT ) 150 MG/ML SOAJ INJECT 1 MILLILITER UNDER THE SKIN EVERY 14 DAYS   cetirizine  (ZYRTEC ) 10 MG tablet Take 1 tablet (10 mg total) by mouth daily as needed for allergies (Can take an extra dose during flare ups.).   cholecalciferol (VITAMIN D3) 25 MCG (1000 UNIT) tablet Take 1,000 Units by mouth daily.   EPINEPHrine  (EPIPEN  2-PAK) 0.3 mg/0.3 mL IJ SOAJ injection Inject 0.3 mg into the muscle as needed for anaphylaxis.   fluticasone  (FLONASE ) 50 MCG/ACT nasal spray Place 2 sprays into both nostrils as needed.   hydroxychloroquine (PLAQUENIL) 200 MG tablet Take 200 mg by mouth in the morning and at bedtime.   levothyroxine  (SYNTHROID ) 75 MCG tablet Take 75 mcg by mouth daily before breakfast.   Misc Natural Products (ATRANTIL) CAPS 1 capsule Orally Twice a day   OVER THE COUNTER MEDICATION Alpha Lipotic Acid 300 mg capsule  taking 1 twice a day with meals   pantoprazole  (PROTONIX ) 40 MG tablet Take 1 tablet by mouth daily at 12 noon.   Polyethyl Glycol-Propyl Glycol (SYSTANE) 0.4-0.3 % GEL ophthalmic gel Place 1 Application into both eyes every 4 (four) hours as needed.   VENTOLIN  HFA 108 (90 Base) MCG/ACT inhaler Inhale 2 puffs into the lungs every 4 (four)  hours as needed for wheezing or shortness of breath.   [DISCONTINUED] ARNUITY ELLIPTA  200 MCG/ACT AEPB INHALE 1 PUFF BY MOUTH DAILY   [DISCONTINUED] doxycycline  (VIBRAMYCIN ) 100 MG capsule Take 1 capsule (100 mg total) by mouth 2 (two) times daily. (Patient not taking: Reported on 07/03/2023)   [DISCONTINUED] loteprednol  (LOTEMAX ) 0.5 % ophthalmic suspension Place 1 drop into both eyes daily.   [DISCONTINUED] Olopatadine  HCl (PATADAY ) 0.2 % SOLN Place 1 drop into both eyes 1 day or 1 dose.   [DISCONTINUED] predniSONE  (DELTASONE ) 20 MG tablet Take 1 tablet (20 mg total) by mouth daily with breakfast.   [DISCONTINUED] promethazine -dextromethorphan (PROMETHAZINE -DM) 6.25-15 MG/5ML syrup Take 2.5 mLs by mouth 3 (three) times daily as needed for cough.   [DISCONTINUED] Wheat Dextrin (BENEFIBER) POWD Take 10 mLs by mouth daily. (Patient not taking: Reported on 07/03/2023)   No facility-administered encounter medications on file as of 07/03/2023.    Allergies:  Allergies  Allergen Reactions   Bee Venom Anaphylaxis   Cephalexin Anaphylaxis and Hives    Puffy eyes and lips  Other reaction(s): facial swelling   Ketorolac  Tromethamine  Anaphylaxis    Puffy lips and  eyes Other reaction(s): anaphylaxis   Atorvastatin     Other reaction(s): muscle cramps   Rosuvastatin Other (See Comments)    Other reaction(s): muscle cramps  Other reaction(s): Other (See Comments)   Simvastatin     Other reaction(s): muscle cramps Other reaction(s): muscle cramps, Other (See Comments) Other reaction(s): muscle cramps     Family History: Family History  Problem Relation Age of Onset   Pancreatic cancer Maternal Aunt        dx. 40s-early 97s   Congestive Heart Failure Mother 93   Lung disease Mother    Colon polyps Mother        unspecified number   Dementia Father        Lewy Body Dementia   Kidney failure Father    Stroke Father    Depression Father    Multiple sclerosis Sister    Depression Sister     Aneurysm Maternal Grandmother        brain   Heart attack Maternal Grandfather    Emphysema Paternal Grandfather    Heart Problems Paternal Grandfather    Other Sister        3 sisters have history of cysts in uterus   Depression Sister    Breast cancer Cousin        dx. 71-50   Testicular cancer Cousin        dx. 30s   Colon polyps Daughter        unspecified number    Social History: Social History   Tobacco Use   Smoking status: Never   Smokeless tobacco: Never  Vaping Use   Vaping status: Never Used  Substance Use Topics   Alcohol use: No   Drug use: No   Social History   Social History Narrative   Not on file    Vital Signs:  BP 127/69   Pulse 75   Ht 5\' 5"  (1.651 m)   Wt 154 lb (69.9 kg)   SpO2 96%   BMI 25.63 kg/m   Neurological Exam: MENTAL STATUS including orientation to time, place, person, recent and remote memory, attention span and concentration, language, and fund of knowledge is normal.  Speech is not dysarthric.  CRANIAL NERVES: II:  No visual field defects.     III-IV-VI: Pupils equal round and reactive to light.  Normal conjugate, extra-ocular eye movements in all directions of gaze.  No nystagmus.  No ptosis.   V:  Normal facial sensation.    VII:  Normal facial symmetry and movements.   VIII:  Normal hearing and vestibular function.   IX-X:  Normal palatal movement.   XI:  Normal shoulder shrug and head rotation.   XII:  Normal tongue strength and range of motion, no deviation or fasciculation.  MOTOR:  No atrophy, fasciculations or abnormal movements.  No pronator drift.   Upper Extremity:  Right  Left  Deltoid  5/5   5/5   Biceps  5/5   5/5   Triceps  5/5   5/5   Wrist extensors  5/5   5/5   Wrist flexors  5/5   5/5   Finger extensors  5/5   5/5   Finger flexors  5/5   5/5   Dorsal interossei  5/5   5/5   Abductor pollicis  5/5   5/5   Tone (Ashworth scale)  0  0   Lower Extremity:  Right  Left  Hip flexors  5/5   5/5  Knee flexors  5/5   5/5   Knee extensors  5/5   5/5   Dorsiflexors  5/5   5/5   Plantarflexors  5/5   5/5   Toe extensors  5/5   5/5   Toe flexors  5/5   5/5   Tone (Ashworth scale)  0  0   MSRs:                                           Right        Left brachioradialis 2+  2+  biceps 2+  2+  triceps 2+  2+  patellar 3+  3+  ankle jerk 2+  2+  Hoffman no  no  plantar response down  down   SENSORY:  Normal and symmetric perception of light touch, pinprick, vibration, and temperature.  Romberg's sign absent.   COORDINATION/GAIT: Normal finger-to- nose-finger.  Intact rapid alternating movements bilaterally.  Able to rise from a chair without using arms.  Gait narrow based and stable.     Thank you for allowing me to participate in patient's care.  If I can answer any additional questions, I would be pleased to do so.    Sincerely,    Damico Partin K. Lydia Sams, DO

## 2023-07-04 ENCOUNTER — Encounter (INDEPENDENT_AMBULATORY_CARE_PROVIDER_SITE_OTHER): Payer: Self-pay | Admitting: Otolaryngology

## 2023-07-04 ENCOUNTER — Ambulatory Visit (INDEPENDENT_AMBULATORY_CARE_PROVIDER_SITE_OTHER): Payer: Medicare Other | Admitting: Otolaryngology

## 2023-07-04 VITALS — BP 125/76 | HR 74 | Ht 64.0 in | Wt 149.0 lb

## 2023-07-04 DIAGNOSIS — H6123 Impacted cerumen, bilateral: Secondary | ICD-10-CM

## 2023-07-04 DIAGNOSIS — R0981 Nasal congestion: Secondary | ICD-10-CM

## 2023-07-04 DIAGNOSIS — J342 Deviated nasal septum: Secondary | ICD-10-CM

## 2023-07-04 DIAGNOSIS — J310 Chronic rhinitis: Secondary | ICD-10-CM | POA: Diagnosis not present

## 2023-07-04 DIAGNOSIS — J343 Hypertrophy of nasal turbinates: Secondary | ICD-10-CM | POA: Diagnosis not present

## 2023-07-04 DIAGNOSIS — R0982 Postnasal drip: Secondary | ICD-10-CM

## 2023-07-04 NOTE — Progress Notes (Unsigned)
 Patient ID: Valerie Snyder, female   DOB: 11/23/1950, 73 y.o.   MRN: [id number]  Follow-up: Nasal congestion, nasal drainage, recurrent cerumen impaction  HPI: The patient is a 73 year old female who returns today for her follow-up evaluation.  The patient has a history of chronic nasal congestion and nasal drainage.  She was previously treated with Flonase  nasal spray and Zyrtec  daily.  According to the patient, she has noted increasing allergy  symptoms during the spring season.  She was seen by Dr. Kozlow, and was continued on nasal saline irrigation, Flonase , and Zyrtec .  She denies any recent sinusitis.  Currently she denies any facial pain, fever, or visual change.  She is still chronically congested.  Exam: General: Communicates without difficulty, well nourished, no acute distress. Head: Normocephalic, no evidence injury, no tenderness, facial buttresses intact without stepoff. Face/sinus: No tenderness to palpation and percussion. Facial movement is normal and symmetric. Eyes: PERRL, EOMI. No scleral icterus, conjunctivae clear. Neuro: CN II exam reveals vision grossly intact.  No nystagmus at any point of gaze. Ears: Auricles well formed without lesions.  Bilateral cerumen impaction.  Nose: External evaluation reveals normal support and skin without lesions.  Dorsum is intact.  Anterior rhinoscopy reveals congested mucosa over anterior aspect of inferior turbinates and deviated septum.  No purulence noted. Oral:  Oral cavity and oropharynx are intact, symmetric, without erythema or edema.  Mucosa is moist without lesions. Neck: Full range of motion without pain.  There is no significant lymphadenopathy.  No masses palpable.  Thyroid  bed within normal limits to palpation.  Parotid glands and submandibular glands equal bilaterally without mass.  Trachea is midline. Neuro:  CN 2-12 grossly intact.   Procedure: Bilateral cerumen disimpaction Anesthesia: None Description: Under the operating  microscope, the cerumen is carefully removed with a combination of cerumen currette, alligator forceps, and suction catheters.  After the cerumen is removed, the TMs are noted to be normal.  No mass, erythema, or lesions. The patient tolerated the procedure well.   Assessment: 1.  Chronic rhinitis with nasal mucosal congestion, nasal septal deviation, and bilateral inferior turbinate hypertrophy.  No polyps, mass, or lesion is noted. 2.  Incidental finding of bilateral cerumen impaction. 3.  No acute infection is noted today.  Plan: 1.  The physical exam findings are reviewed with the patient. 2.  Otomicroscopy with bilateral cerumen disimpaction. 3.  Continue with Flonase , Zyrtec , and nasal saline irrigation daily. 4.  The patient will return for reevaluation in 1 year.

## 2023-07-05 DIAGNOSIS — R0982 Postnasal drip: Secondary | ICD-10-CM | POA: Insufficient documentation

## 2023-07-05 DIAGNOSIS — H6123 Impacted cerumen, bilateral: Secondary | ICD-10-CM | POA: Insufficient documentation

## 2023-07-05 DIAGNOSIS — J310 Chronic rhinitis: Secondary | ICD-10-CM | POA: Insufficient documentation

## 2023-07-05 DIAGNOSIS — J343 Hypertrophy of nasal turbinates: Secondary | ICD-10-CM | POA: Insufficient documentation

## 2023-07-05 DIAGNOSIS — J342 Deviated nasal septum: Secondary | ICD-10-CM | POA: Insufficient documentation

## 2023-07-07 IMAGING — DX DG ABDOMEN 2V
2 series · 2 of 2 positions shown · non-contrast
Comparison: None.

CLINICAL DATA: Constipation

EXAM:
ABDOMEN - 2 VIEW

[dg abd 2 views (1 of 2)]
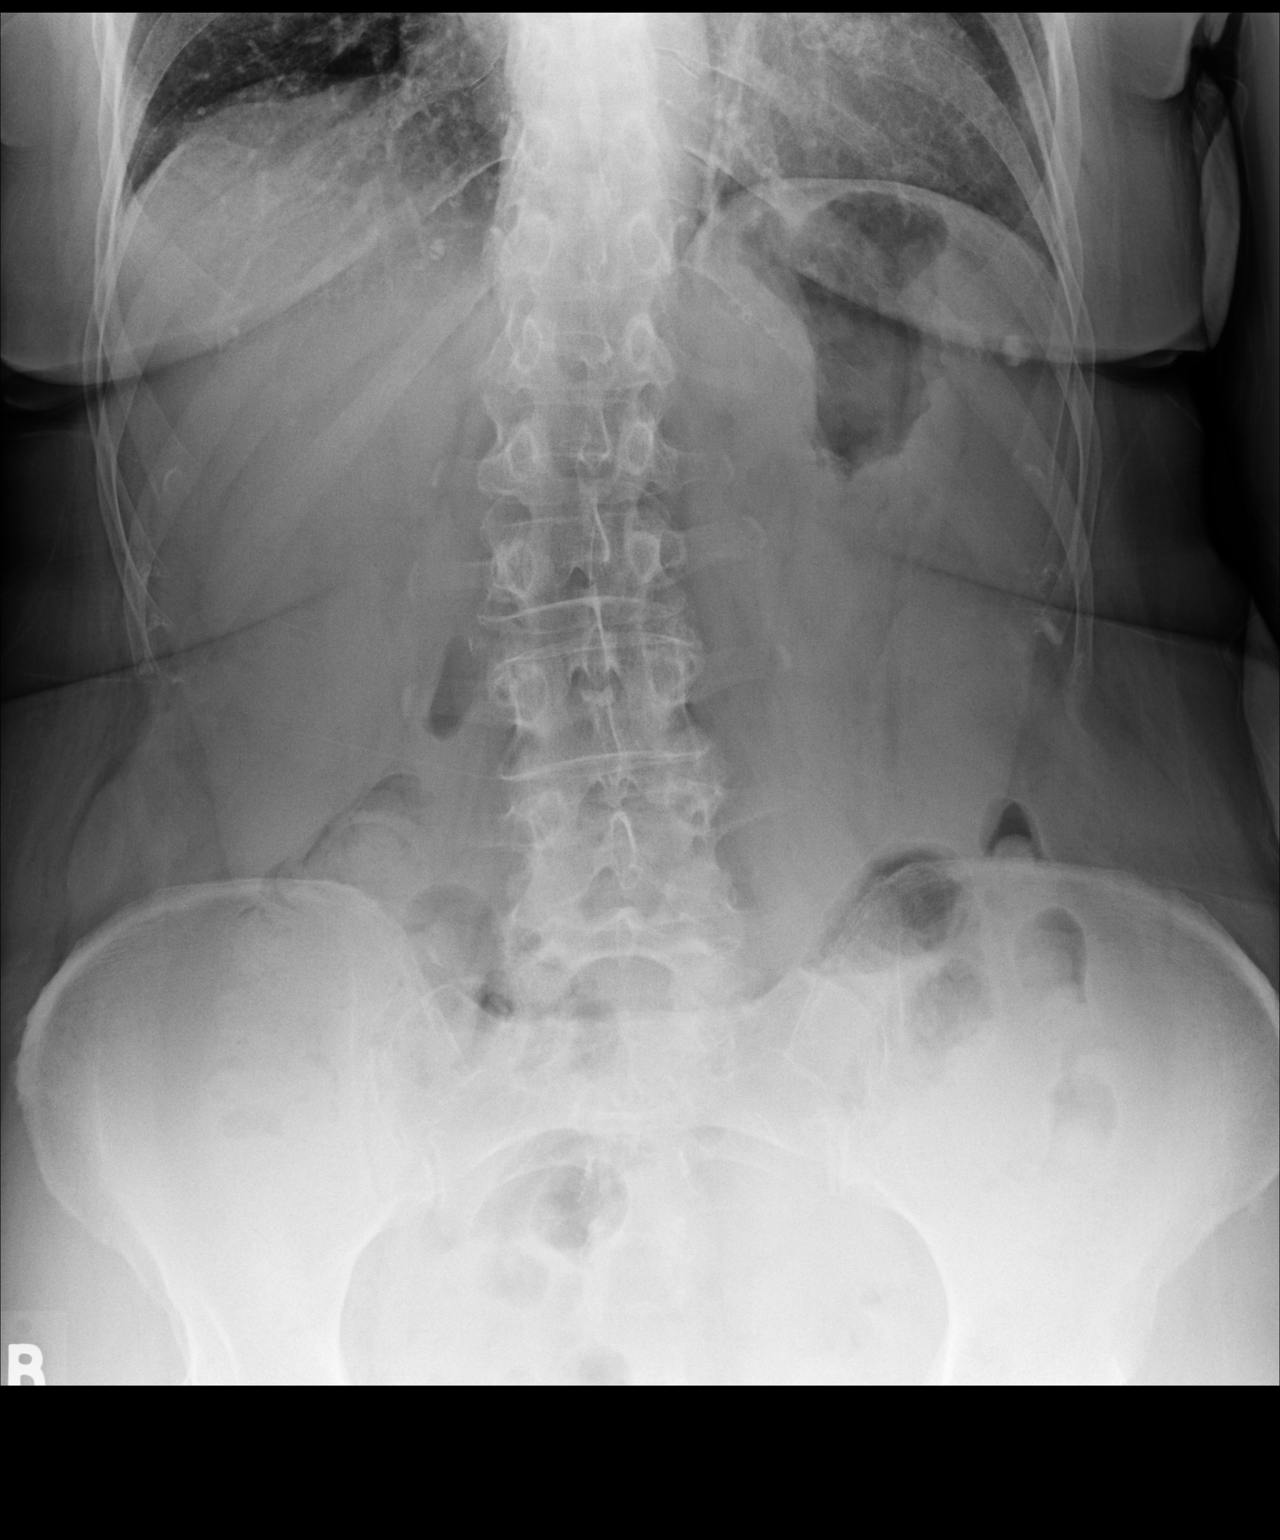

[dg abd 2 views (2 of 2)]
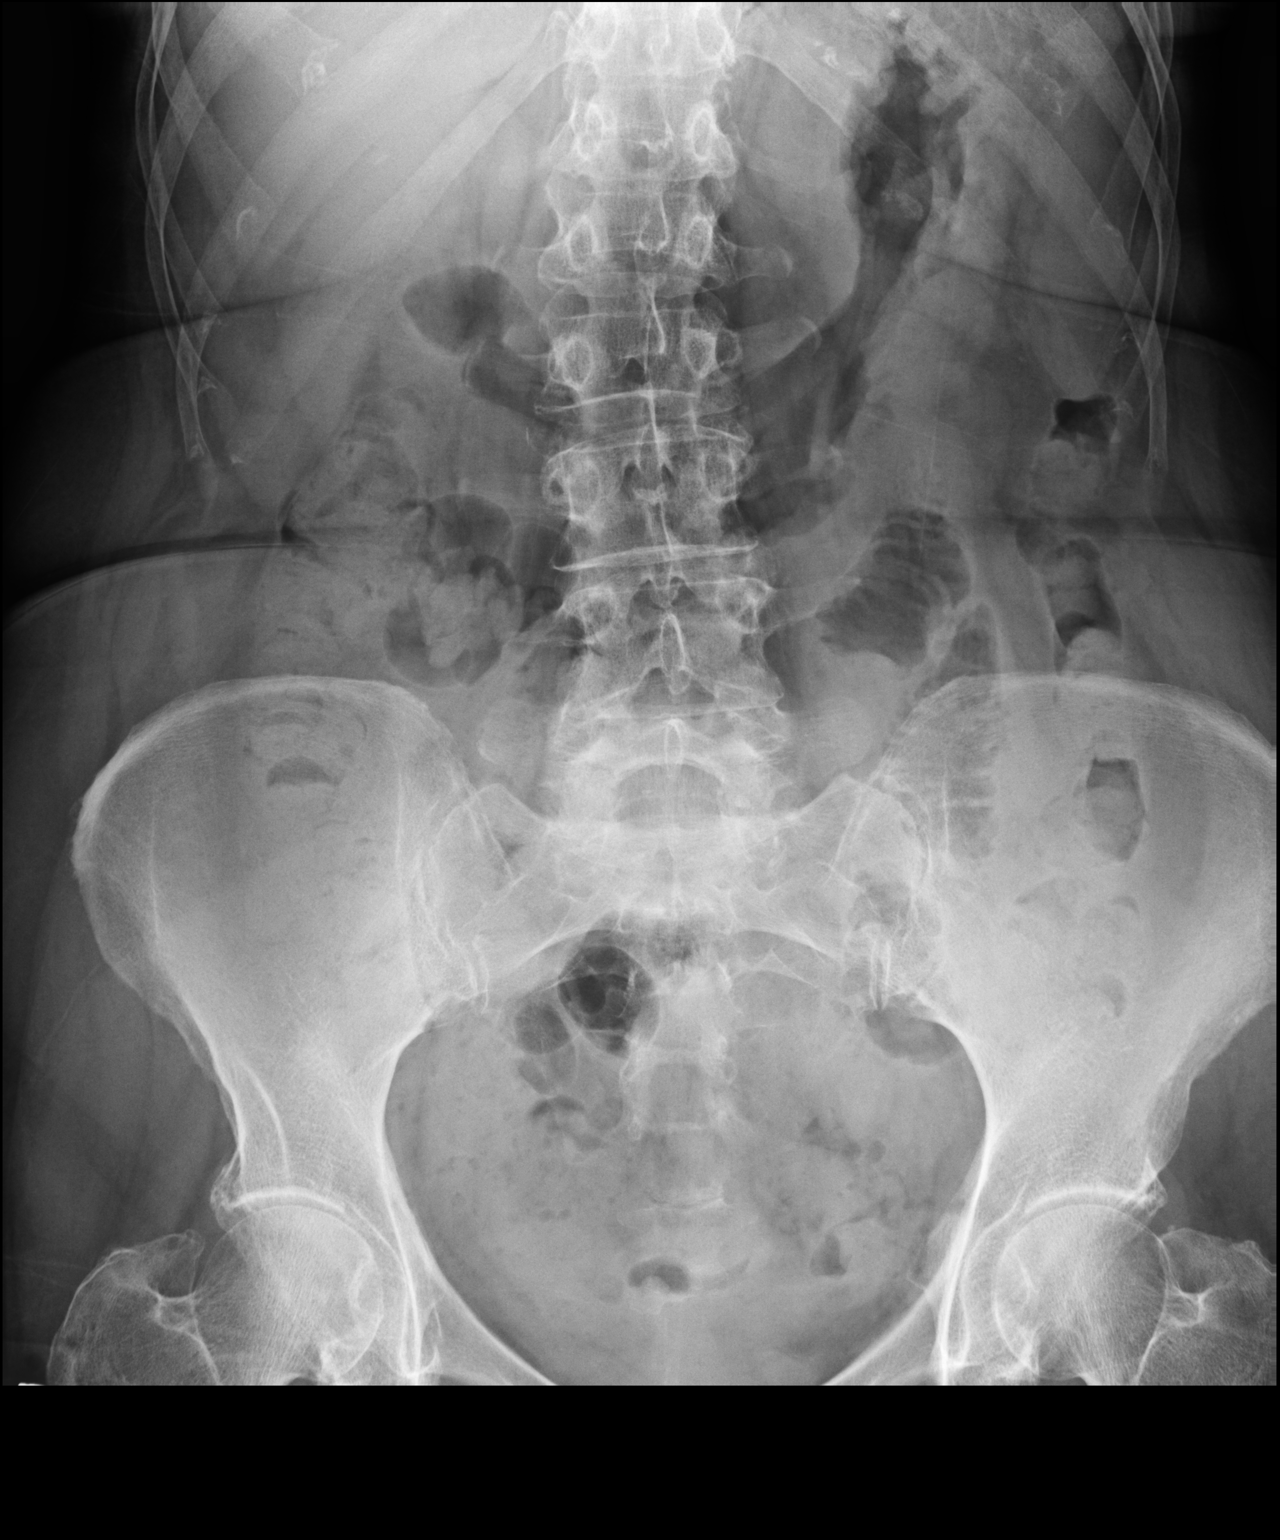

[2 of 2 positions shown; findings below may reference images not displayed]

FINDINGS: No dilated large or small bowel. Normal stool volume. Gas and stool
in the rectum. No pathologic calcifications. No organomegaly.
IMPRESSION: 1. No evidence of bowel obstruction.
2. Normal stool volume.

## 2023-07-25 DIAGNOSIS — G4733 Obstructive sleep apnea (adult) (pediatric): Secondary | ICD-10-CM | POA: Diagnosis not present

## 2023-07-27 DIAGNOSIS — E039 Hypothyroidism, unspecified: Secondary | ICD-10-CM | POA: Diagnosis not present

## 2023-07-27 DIAGNOSIS — G4733 Obstructive sleep apnea (adult) (pediatric): Secondary | ICD-10-CM | POA: Diagnosis not present

## 2023-07-27 DIAGNOSIS — E559 Vitamin D deficiency, unspecified: Secondary | ICD-10-CM | POA: Diagnosis not present

## 2023-07-27 DIAGNOSIS — I2510 Atherosclerotic heart disease of native coronary artery without angina pectoris: Secondary | ICD-10-CM | POA: Diagnosis not present

## 2023-07-27 DIAGNOSIS — R109 Unspecified abdominal pain: Secondary | ICD-10-CM | POA: Diagnosis not present

## 2023-07-27 DIAGNOSIS — I700 Atherosclerosis of aorta: Secondary | ICD-10-CM | POA: Diagnosis not present

## 2023-07-27 DIAGNOSIS — E7800 Pure hypercholesterolemia, unspecified: Secondary | ICD-10-CM | POA: Diagnosis not present

## 2023-07-28 ENCOUNTER — Ambulatory Visit (INDEPENDENT_AMBULATORY_CARE_PROVIDER_SITE_OTHER): Admitting: Podiatry

## 2023-07-28 DIAGNOSIS — G629 Polyneuropathy, unspecified: Secondary | ICD-10-CM | POA: Diagnosis not present

## 2023-07-28 DIAGNOSIS — M722 Plantar fascial fibromatosis: Secondary | ICD-10-CM | POA: Diagnosis not present

## 2023-07-28 NOTE — Progress Notes (Signed)
 Subjective: Chief Complaint  Patient presents with   Foot Pain    RM#13 Follow up on foot pain patient states doing good   73 year old female presents the office with above concerns.  She states that she is doing well.  She is very happy with the inserts.  She is still taking the alpha lipoic acid. She is doing home stretching/rehab exercises and wearing good supportive shoes. No new concerns.   Objective: AAO x3, NAD DP/PT pulses palpable bilaterally, CRT less than 3 seconds Hammertoes are present.  There are no areas of pinpoint tenderness bilaterally.  There is no discomfort noted on the arch along the course of the plantar fascia.  There is no edema, erythema.  Flexor, sensory tendons appear to be intact.  MMT 5/5.  Negative Tinel sign.  Mild hyperkeratotic tissue noted submetatarsal 3, 5 without any underlying ulceration, drainage or signs of infection. No pain with calf compression, swelling, warmth, erythema  Assessment: Plantar fasciitis, neuropathy  Plan: -All treatment options discussed with the patient including all alternatives, risks, complications.  -As a courtesy I debrided the calluses without any complications or bleeding.  Overall she is doing much better.  Continue with current dose of alpha lipoic acid as well as her home stretching, rehab exercises.  Orthotics to be fitting well.    Return if symptoms worsen or fail to improve.  Donnice JONELLE Fees DPM

## 2023-08-01 ENCOUNTER — Ambulatory Visit
Admission: RE | Admit: 2023-08-01 | Discharge: 2023-08-01 | Disposition: A | Discharge status: Home / Self Care | Source: Ambulatory Visit | Attending: Neurology

## 2023-08-01 DIAGNOSIS — M4316 Spondylolisthesis, lumbar region: Secondary | ICD-10-CM | POA: Diagnosis not present

## 2023-08-01 DIAGNOSIS — M47816 Spondylosis without myelopathy or radiculopathy, lumbar region: Secondary | ICD-10-CM | POA: Diagnosis not present

## 2023-08-01 DIAGNOSIS — R292 Abnormal reflex: Secondary | ICD-10-CM

## 2023-08-03 ENCOUNTER — Ambulatory Visit: Payer: Self-pay | Admitting: Neurology

## 2023-08-03 DIAGNOSIS — H15102 Unspecified episcleritis, left eye: Secondary | ICD-10-CM | POA: Diagnosis not present

## 2023-08-03 DIAGNOSIS — R202 Paresthesia of skin: Secondary | ICD-10-CM

## 2023-08-03 DIAGNOSIS — R29898 Other symptoms and signs involving the musculoskeletal system: Secondary | ICD-10-CM

## 2023-08-07 ENCOUNTER — Emergency Department (HOSPITAL_BASED_OUTPATIENT_CLINIC_OR_DEPARTMENT_OTHER)

## 2023-08-07 ENCOUNTER — Other Ambulatory Visit: Payer: Self-pay

## 2023-08-07 ENCOUNTER — Encounter (HOSPITAL_BASED_OUTPATIENT_CLINIC_OR_DEPARTMENT_OTHER): Payer: Self-pay | Admitting: Emergency Medicine

## 2023-08-07 ENCOUNTER — Emergency Department (HOSPITAL_BASED_OUTPATIENT_CLINIC_OR_DEPARTMENT_OTHER)
Admission: EM | Admit: 2023-08-07 | Discharge: 2023-08-07 | Disposition: A | Discharge status: Home / Self Care | Attending: Emergency Medicine | Admitting: Emergency Medicine

## 2023-08-07 DIAGNOSIS — K5730 Diverticulosis of large intestine without perforation or abscess without bleeding: Secondary | ICD-10-CM | POA: Diagnosis not present

## 2023-08-07 DIAGNOSIS — R140 Abdominal distension (gaseous): Secondary | ICD-10-CM | POA: Diagnosis not present

## 2023-08-07 DIAGNOSIS — J45909 Unspecified asthma, uncomplicated: Secondary | ICD-10-CM | POA: Insufficient documentation

## 2023-08-07 DIAGNOSIS — E039 Hypothyroidism, unspecified: Secondary | ICD-10-CM | POA: Insufficient documentation

## 2023-08-07 DIAGNOSIS — N949 Unspecified condition associated with female genital organs and menstrual cycle: Secondary | ICD-10-CM | POA: Diagnosis not present

## 2023-08-07 DIAGNOSIS — R5383 Other fatigue: Secondary | ICD-10-CM | POA: Insufficient documentation

## 2023-08-07 DIAGNOSIS — I2510 Atherosclerotic heart disease of native coronary artery without angina pectoris: Secondary | ICD-10-CM | POA: Insufficient documentation

## 2023-08-07 DIAGNOSIS — N939 Abnormal uterine and vaginal bleeding, unspecified: Secondary | ICD-10-CM | POA: Diagnosis present

## 2023-08-07 DIAGNOSIS — I700 Atherosclerosis of aorta: Secondary | ICD-10-CM | POA: Diagnosis not present

## 2023-08-07 DIAGNOSIS — K838 Other specified diseases of biliary tract: Secondary | ICD-10-CM | POA: Diagnosis not present

## 2023-08-07 DIAGNOSIS — N898 Other specified noninflammatory disorders of vagina: Secondary | ICD-10-CM | POA: Diagnosis not present

## 2023-08-07 DIAGNOSIS — R42 Dizziness and giddiness: Secondary | ICD-10-CM | POA: Diagnosis not present

## 2023-08-07 DIAGNOSIS — R109 Unspecified abdominal pain: Secondary | ICD-10-CM | POA: Insufficient documentation

## 2023-08-07 DIAGNOSIS — Z853 Personal history of malignant neoplasm of breast: Secondary | ICD-10-CM | POA: Insufficient documentation

## 2023-08-07 DIAGNOSIS — R102 Pelvic and perineal pain: Secondary | ICD-10-CM | POA: Diagnosis not present

## 2023-08-07 DIAGNOSIS — R3129 Other microscopic hematuria: Secondary | ICD-10-CM | POA: Diagnosis not present

## 2023-08-07 LAB — COMPREHENSIVE METABOLIC PANEL WITH GFR
ALT: 19 U/L (ref 0–44)
AST: 23 U/L (ref 15–41)
Albumin: 4.6 g/dL (ref 3.5–5.0)
Alkaline Phosphatase: 61 U/L (ref 38–126)
Anion gap: 12 (ref 5–15)
BUN: 14 mg/dL (ref 8–23)
CO2: 24 mmol/L (ref 22–32)
Calcium: 9.6 mg/dL (ref 8.9–10.3)
Chloride: 105 mmol/L (ref 98–111)
Creatinine, Ser: 0.66 mg/dL (ref 0.44–1.00)
GFR, Estimated: >60 mL/min (ref >60)
Glucose, Bld: 97 mg/dL (ref 70–99)
Potassium: 4.6 mmol/L (ref 3.5–5.1)
Sodium: 142 mmol/L (ref 135–145)
Total Bilirubin: 0.5 mg/dL (ref 0.0–1.2)
Total Protein: 7.1 g/dL (ref 6.5–8.1)

## 2023-08-07 LAB — CBC
HCT: 41.2 % (ref 36.0–46.0)
Hemoglobin: 13.6 g/dL (ref 12.0–15.0)
MCH: 30.4 pg (ref 26.0–34.0)
MCHC: 33 g/dL (ref 30.0–36.0)
MCV: 92.2 fL (ref 80.0–100.0)
Platelets: 199 K/uL (ref 150–400)
RBC: 4.47 MIL/uL (ref 3.87–5.11)
RDW: 12.7 % (ref 11.5–15.5)
WBC: 4.3 K/uL (ref 4.0–10.5)
nRBC: 0 % (ref 0.0–0.2)

## 2023-08-07 LAB — TROPONIN T, HIGH SENSITIVITY: Troponin T High Sensitivity: <15 ng/L (ref <19)

## 2023-08-07 LAB — CBG MONITORING, ED: Glucose-Capillary: 101 mg/dL — ABNORMAL HIGH (ref 70–99)

## 2023-08-07 LAB — RESP PANEL BY RT-PCR (RSV, FLU A&B, COVID)  RVPGX2
Influenza A by PCR: NEGATIVE
Influenza B by PCR: NEGATIVE
Resp Syncytial Virus by PCR: NEGATIVE
SARS Coronavirus 2 by RT PCR: NEGATIVE

## 2023-08-07 MED ORDER — IOHEXOL 300 MG/ML  SOLN
80.0000 mL | Freq: Once | INTRAMUSCULAR | Status: AC | PRN
  Administered 2023-08-07: 80 mL via INTRAVENOUS

## 2023-08-07 MED ORDER — ACETAMINOPHEN 500 MG PO TABS
1000.0000 mg | ORAL_TABLET | Freq: Once | ORAL | Status: DC
  Filled 2023-08-07: qty 2

## 2023-08-07 MED ORDER — LACTATED RINGERS IV BOLUS
1000.0000 mL | Freq: Once | INTRAVENOUS | Status: AC
  Administered 2023-08-07: 1000 mL via INTRAVENOUS

## 2023-08-07 NOTE — ED Notes (Signed)
 Pt had UA performed today. Handed paperwork with results to EDP. Verbal order to DC ours.

## 2023-08-07 NOTE — ED Notes (Signed)
 Dc instructions given, pt verbalized understanding. PIV removed. Pt out of ED with steady gait with husband not in visible distress.

## 2023-08-07 NOTE — ED Provider Notes (Signed)
 [facility] Provider Note  CSN: [id number] Arrival date & time: 08/07/23 1234  Chief Complaint(s) Vaginal Discharge and Dizziness  HPI Valerie Snyder is a 73 y.o. female who is here today for about 2 weeks of abdominal bloating.  She also endorses some intermittent vaginal discharge.  She went to her PCPs office today.  While there, she had a urinalysis, pelvic exam with swabs  done.  Patient states that she just been feeling unwell recently and fatigued.   Past Medical History Past Medical History:  Diagnosis Date   Abnormal uterine bleeding (AUB)    Allergy     Asthma    allergy  related   Breast cancer of 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  72nd percentile 11/2018.   Complication of anesthesia    10 years ago- pt states she stopped breathing and had to be bagged   Concussion 2018   GERD (gastroesophageal reflux disease)    H/O bilateral inguinal hernia repair 1992   Hypercholesteremia    Hypothyroidism    Migraines    Personal history of radiation therapy    Plantar fasciitis, bilateral    Pure hypercholesterolemia 10/10/2018   Radiation 01/12/15-02/04/15   right breast 42.17 gray   Spinal stenosis    Thyroid  disease    Patient Active Problem List   Diagnosis Date Noted   Impacted cerumen of both ears 07/05/2023   Chronic rhinitis 07/05/2023   Deviated nasal septum 07/05/2023   Hypertrophy of nasal turbinates 07/05/2023   Postnasal drip 07/05/2023   Seasonal allergic rhinitis due to pollen 11/03/2022   Perennial allergic rhinitis 11/03/2022   Gastroesophageal reflux disease 11/03/2022   Hymenoptera allergy  11/03/2022   Cholecystitis 02/19/2021   CAD in native artery 01/15/2019   Pure hypercholesterolemia 10/10/2018   Genetic testing 12/10/2014   Breast cancer of upper-inner quadrant of right  female breast (HCC) 11/13/2014   Allergic rhinoconjunctivitis 10/22/2014   Asthma, well controlled 10/22/2014   History of basal cell cancer 03/21/2014   Visit for wound check 07/29/2013   Visit for suture removal 07/11/2013   Basal cell carcinoma of nasal tip 07/04/2013   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 Authorizing Provider  acetaminophen  (TYLENOL ) 500 MG tablet Take 500 mg by mouth as needed. 04/23/22   [provider]  Alirocumab  (PRALUENT ) 150 MG/ML SOAJ INJECT 1 MILLILITER UNDER THE SKIN EVERY 14 DAYS 05/23/23   Raford Riggs, MD  cetirizine  (ZYRTEC ) 10 MG tablet Take 1 tablet (10 mg total) by mouth daily as needed for allergies (Can take an extra dose during flare ups.). 05/16/23   Kozlow, Eric J, MD  cholecalciferol (VITAMIN D3) 25 MCG (1000 UNIT) tablet Take 1,000 Units by mouth daily.    [provider]  EPINEPHrine  (EPIPEN  2-PAK) 0.3 mg/0.3 mL IJ SOAJ injection Inject 0.3 mg into the muscle as needed for anaphylaxis. 05/16/23   Kozlow, Camellia PARAS, MD  fluticasone  (FLONASE ) 50 MCG/ACT nasal spray Place 2 sprays into both nostrils as needed. 05/16/23   Kozlow, Camellia PARAS, MD  hydroxychloroquine (PLAQUENIL) 200 MG tablet  Take 200 mg by mouth in the morning and at bedtime. 11/27/19   [provider]  levothyroxine  (SYNTHROID ) 75 MCG tablet Take 75 mcg by mouth daily before breakfast. 12/20/19   [provider]  Misc Natural Products (ATRANTIL) CAPS 1 capsule Orally Twice a day    [provider]  OVER THE COUNTER MEDICATION Alpha Lipotic Acid 300 mg capsule  taking 1 twice a day with meals    [provider]  pantoprazole   (PROTONIX ) 40 MG tablet Take 1 tablet by mouth daily at 12 noon. 06/17/21   [provider]  Polyethyl Glycol-Propyl Glycol (SYSTANE) 0.4-0.3 % GEL ophthalmic gel Place 1 Application into both eyes every 4 (four) hours as needed. 05/16/23   Kozlow, Camellia PARAS, MD  VENTOLIN  HFA 108 (90 Base) MCG/ACT inhaler Inhale 2 puffs into the lungs every 4 (four) hours as needed for wheezing or shortness of breath. 05/16/23   Kozlow, Camellia PARAS, MD                                                                                                                                    Past Surgical History Past Surgical History:  Procedure Laterality Date   BLADDER SUSPENSION     BLEPHAROPLASTY Bilateral    BREAST BIOPSY Left 01/28/2022   MM LT BREAST BX W LOC DEV 1ST LESION IMAGE BX SPEC STEREO GUIDE 01/28/2022 [facility]   BREAST LUMPECTOMY Right 12/10/2014   BREAST LUMPECTOMY WITH RADIOACTIVE SEED LOCALIZATION Right 12/10/2014   Procedure: RIGHT BREAST LUMPECTOMY WITH RADIOACTIVE SEED LOCALIZATION;  Surgeon: Deward Null III, MD;  Location: [facility];  Service: General;  Laterality: Right;   CHOLECYSTECTOMY N/A 02/20/2021   Procedure: LAPAROSCOPIC CHOLECYSTECTOMY;  Surgeon: Rubin Calamity, MD;  Location: [facility];  Service: General;  Laterality: N/A;   DILATATION & CURETTAGE/HYSTEROSCOPY WITH MYOSURE N/A 07/15/2020   Procedure: DILATATION & CURETTAGE/HYSTEROSCOPY MARGARETE USING MYOSURE;  Surgeon: Rosalva Sawyer, MD;  Location: [facility];  Service: Gynecology;  Laterality: N/A;   FOOT SURGERY     plantar fashiectomies from both feet   HERNIA REPAIR     HYSTEROSCOPY WITH D & C N/A 12/27/2017   Procedure: DILATATION AND CURETTAGE /HYSTEROSCOPY;  Surgeon: Marilynn Nest, DO;  Location: [facility];  Service: Gynecology;  Laterality: N/A;   NASAL SEPTUM SURGERY     neuromas removed from right foot     osteoma  1991   removal from right orbital   POLYPECTOMY   12/27/2017   Procedure: POLYPECTOMY;  Surgeon: Marilynn Nest, DO;  Location: [facility];  Service: Gynecology;;   removal of basal cell carcinoma (nose)     TONSILECTOMY/ADENOIDECTOMY WITH MYRINGOTOMY     TONSILLECTOMY     Family History Family History  Problem Relation Age of Onset   Pancreatic cancer Maternal Aunt        dx. 40s-early 68s  Congestive Heart Failure Mother 31   Lung disease Mother    Colon polyps Mother        unspecified number   Dementia Father        Lewy Body Dementia   Kidney failure Father    Stroke Father    Depression Father    Multiple sclerosis Sister    Depression Sister    Aneurysm Maternal Grandmother        brain   Heart attack Maternal Grandfather    Emphysema Paternal Grandfather    Heart Problems Paternal Grandfather    Other Sister        3 sisters have history of cysts in uterus   Depression Sister    Breast cancer Cousin        dx. 1-50   Testicular cancer Cousin        dx. 30s   Colon polyps Daughter        unspecified number    Social History Social History   Tobacco Use   Smoking status: Never   Smokeless tobacco: Never  Vaping Use   Vaping status: Never Used  Substance Use Topics   Alcohol use: No   Drug use: No   Allergies Bee venom, Cephalexin, Ketorolac  tromethamine , Atorvastatin, Rosuvastatin, and Simvastatin  Review of Systems Review of Systems  Physical Exam Vital Signs  I have reviewed the triage vital signs BP (!) 131/119   Pulse 66   Temp 98.2 F (36.8 C) (Oral)   Resp 14   Ht 5' 4 (1.626 m)   Wt 68 kg   SpO2 99%   BMI 25.75 kg/m   Physical Exam Vitals and nursing note reviewed.  HENT:     Head: Normocephalic.  Cardiovascular:     Rate and Rhythm: Normal rate.  Pulmonary:     Effort: Pulmonary effort is normal.  Abdominal:     General: Abdomen is flat.     Palpations: Abdomen is soft.     Tenderness: There is no abdominal tenderness. There is no guarding.     Hernia:  No hernia is present.  Neurological:     General: No focal deficit present.     Mental Status: She is alert.     ED Results and Treatments Labs (all labs ordered are listed, but only abnormal results are displayed) Labs Reviewed  CBG MONITORING, ED - Abnormal; Notable for the following components:      Result Value   Glucose-Capillary 101 (*)    All other components within normal limits  RESP PANEL BY RT-PCR (RSV, FLU A&B, COVID)  RVPGX2  COMPREHENSIVE METABOLIC PANEL WITH GFR  CBC                                                                                                                          Radiology No results found.  Pertinent labs & imaging results that were available during my care of the patient were reviewed by me and  considered in my medical decision making (see MDM for details).  Medications Ordered in ED Medications  acetaminophen  (TYLENOL ) tablet 1,000 mg (1,000 mg Oral Patient Refused/Not Given 08/07/23 1736)  lactated ringers  bolus 1,000 mL (1,000 mLs Intravenous New Bag/Given 08/07/23 1744)                                                                                                                                     Procedures Procedures  (including critical care time)  Medical Decision Making / ED Course   This patient presents to the ED for concern of abdominal bloating intermittent, fatigue, this involves an extensive number of treatment options, and is a complaint that carries with it a high risk of complications and morbidity.  The differential diagnosis includes viral syndrome, fatigue, dehydration, intra-abdominal infection, enteritis, considered ACS.  MDM: Patient overall well-appearing.  Has normal vital signs.  Has a soft abdomen.  Patient had pelvic exam and swabs  done at her PCPs office, do not believe need to repeat.  Urinalysis reviewed from PCPs office, negative.  Will check basic blood work on the patient obtain imaging of her abdomen  pelvis.  EKG and troponin ordered in the event that her fatigue is cardiac related.  No chest pain or shortness of breath.  Reassessment 9 PM-patient CT imaging of the abdomen pelvis negative.  Troponin negative.  Workup overall reassuring.  She is going to follow her wet prep done by her PCP.   Additional history obtained: -Additional history obtained from husband at bedside -External records from outside source obtained and reviewed including: Chart review including previous notes, labs, imaging, consultation notes   Lab Tests: -I ordered, reviewed, and interpreted labs.   The pertinent results include:   Labs Reviewed  CBG MONITORING, ED - Abnormal; Notable for the following components:      Result Value   Glucose-Capillary 101 (*)    All other components within normal limits  RESP PANEL BY RT-PCR (RSV, FLU A&B, COVID)  RVPGX2  COMPREHENSIVE METABOLIC PANEL WITH GFR  CBC      EKG my independent review of the patient's EKG shows no ST segment depressions or elevations, no T wave inversions, no evidence of acute ischemia.  EKG Interpretation Date/Time:  Monday August 07 2023 13:28:42 EDT Ventricular Rate:  72 PR Interval:  155 QRS Duration:  88 QT Interval:  414 QTC Calculation: 454 R Axis:   70  Text Interpretation: Sinus rhythm Confirmed by Mannie Pac [phone]) on 08/07/2023 5:07:09 PM         Imaging Studies ordered: I ordered imaging studies including CT abdomen pelvis I independently visualized and interpreted imaging. I agree with the radiologist interpretation   Medicines ordered and prescription drug management: Meds ordered this encounter  Medications   lactated ringers  bolus 1,000 mL   acetaminophen  (TYLENOL ) tablet 1,000 mg    -I have reviewed the patients home medicines and have  made adjustments as needed  Cardiac Monitoring: The patient was maintained on a cardiac monitor.  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  72nd percentile 11/2018.   Complication of anesthesia    10 years ago- pt states she stopped breathing and had to be bagged   Concussion 2018   GERD (gastroesophageal reflux disease)    H/O bilateral inguinal hernia repair 1992   Hypercholesteremia    Hypothyroidism    Migraines    Personal history of radiation therapy    Plantar fasciitis, bilateral    Pure hypercholesterolemia 10/10/2018   Radiation 01/12/15-02/04/15   right breast 42.17 gray   Spinal stenosis    Thyroid  disease       Dispostion: I considered admission for this patient, however with reassuring workup and exam she is appropriate for outpatient follow-up.     Final Clinical Impression(s) / ED Diagnoses Final diagnoses:  None     @PCDICTATION @    Mannie Pac T, DO 08/07/23 2105

## 2023-08-07 NOTE — Discharge Instructions (Addendum)
 While you are in the emergency room, you have blood work done that was normal.  He also the CT scan done of your abdomen and pelvis that was normal.  At this time, I do not have a clear cause for your symptoms.  Please follow-up with your primary care doctor.  Return to the emergency department for worsening pain in your abdomen, inability to eat or drink.

## 2023-08-07 NOTE — ED Triage Notes (Signed)
 Pt POv in wheelchair- c/o flu-like sx x 3 days. Also c/o pelvic pain and vaginal d/c worsening x2 weeks, seen at pcp for same.   Also c/o dizziness upon standing at the drs office during pelvic exam.  Reports good po intake. Denies dysuria.   Also c/o feeling bloated, intermittent  x2 years.

## 2023-08-10 DIAGNOSIS — H15102 Unspecified episcleritis, left eye: Secondary | ICD-10-CM | POA: Diagnosis not present

## 2023-08-10 DIAGNOSIS — H5712 Ocular pain, left eye: Secondary | ICD-10-CM | POA: Diagnosis not present

## 2023-08-10 NOTE — Telephone Encounter (Signed)
 Patient called and would like to move forward with the ABI. Patient would like order sent to  [facility] in [place]. Patient aware to call and let us  know if she has any issues. Patient aware I am sending referral.

## 2023-08-24 ENCOUNTER — Encounter: Payer: Self-pay | Admitting: Emergency Medicine

## 2023-08-24 ENCOUNTER — Ambulatory Visit
Admission: EM | Admit: 2023-08-24 | Discharge: 2023-08-24 | Disposition: A | Discharge status: Home / Self Care | Attending: Family Medicine | Admitting: Family Medicine

## 2023-08-24 DIAGNOSIS — R350 Frequency of micturition: Secondary | ICD-10-CM | POA: Diagnosis not present

## 2023-08-24 DIAGNOSIS — N760 Acute vaginitis: Secondary | ICD-10-CM | POA: Diagnosis not present

## 2023-08-24 DIAGNOSIS — N898 Other specified noninflammatory disorders of vagina: Secondary | ICD-10-CM | POA: Insufficient documentation

## 2023-08-24 LAB — POCT URINALYSIS DIP (MANUAL ENTRY)
Bilirubin, UA: NEGATIVE
Glucose, UA: NEGATIVE mg/dL
Ketones, POC UA: NEGATIVE mg/dL
Nitrite, UA: NEGATIVE
Protein Ur, POC: NEGATIVE mg/dL
Spec Grav, UA: 1.025 (ref 1.010–1.025)
Urobilinogen, UA: 0.2 U/dL
pH, UA: 5.5 (ref 5.0–8.0)

## 2023-08-24 MED ORDER — FLUCONAZOLE 150 MG PO TABS: 150.0000 mg | ORAL_TABLET | ORAL | 0 refills | Status: AC

## 2023-08-24 NOTE — ED Provider Notes (Signed)
 [facility]    CSN: [id number] Arrival date & time: 08/24/23  1453      History   Chief Complaint Chief Complaint  Patient presents with   Vaginal Itching    HPI Valerie Snyder is a 73 y.o. female.   Valerie Snyder is a 73 y.o. female presenting for chief complaint of persistent vaginal itching and brown vaginal discharge that started 2-3 weeks ago.  Additionally reports intermittent urinary frequency. Discharge is thin, without odor, and vaginal region is very irritated. Denies rash to the vaginal region. Reports associated abdominal bloating and suprapubic/central pelvic discomfort associated with symptoms. She went to the ER for this 2 weeks ago where workup was mostly unremarkable, CT abdomen pelvis showed no explanation for patient's pain. She denies dysuria, urinary urgency, gross hematuria, and flank pain. No recent fevers, chills, n/v/d, or blood/mucous to the stools. Stooling normally without constipation. Wet prep in the ER was negative.    Vaginal Itching    Past Medical History:  Diagnosis Date   Abnormal uterine bleeding (AUB)    Allergy     Asthma    allergy  related   Breast cancer of 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  72nd percentile 11/2018.   Complication of anesthesia    10 years ago- pt states she stopped breathing and had to be bagged   Concussion 2018   GERD (gastroesophageal reflux disease)    H/O bilateral inguinal hernia repair 1992   Hypercholesteremia    Hypothyroidism    Migraines    Personal history of radiation therapy    Plantar fasciitis, bilateral    Pure hypercholesterolemia 10/10/2018   Radiation 01/12/15-02/04/15   right breast 42.17 gray   Spinal stenosis    Thyroid  disease     Patient Active Problem List   Diagnosis Date Noted   Impacted cerumen of both ears 07/05/2023   Chronic  rhinitis 07/05/2023   Deviated nasal septum 07/05/2023   Hypertrophy of nasal turbinates 07/05/2023   Postnasal drip 07/05/2023   Seasonal allergic rhinitis due to pollen 11/03/2022   Perennial allergic rhinitis 11/03/2022   Gastroesophageal reflux disease 11/03/2022   Hymenoptera allergy  11/03/2022   Cholecystitis 02/19/2021   CAD in native artery 01/15/2019   Pure hypercholesterolemia 10/10/2018   Genetic testing 12/10/2014   Breast cancer of 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 breast (HCC) 11/13/2014   Allergic rhinoconjunctivitis 10/22/2014   Asthma, well controlled 10/22/2014   History of basal cell cancer 03/21/2014   Visit for wound check 07/29/2013   Visit for suture removal 07/11/2013   Basal cell carcinoma of nasal tip 07/04/2013   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   Neoplasm of uncertain behavior of skin 06/19/2013   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   Lentigo 12/02/2011   Melanocytic nevi of trunk 12/02/2011   SK (seborrheic keratosis) 12/02/2011   BCC (basal cell carcinoma) 01/29/1898    Past Surgical History:  Procedure Laterality Date   BLADDER SUSPENSION     BLEPHAROPLASTY Bilateral    BREAST BIOPSY Left 01/28/2022   MM LT BREAST BX W LOC DEV 1ST LESION IMAGE BX SPEC STEREO GUIDE 01/28/2022 [facility]   BREAST LUMPECTOMY Right 12/10/2014   BREAST LUMPECTOMY WITH RADIOACTIVE SEED LOCALIZATION Right 12/10/2014   Procedure: RIGHT BREAST LUMPECTOMY WITH RADIOACTIVE SEED LOCALIZATION;  Surgeon: Deward Null III, MD;  Location: [facility];  Service: General;  Laterality: Right;  CHOLECYSTECTOMY N/A 02/20/2021   Procedure: LAPAROSCOPIC CHOLECYSTECTOMY;  Surgeon: Rubin Calamity, MD;  Location: [facility];  Service: General;  Laterality: N/A;   DILATATION & CURETTAGE/HYSTEROSCOPY WITH MYOSURE N/A 07/15/2020   Procedure: DILATATION & CURETTAGE/HYSTEROSCOPY MARGARETE USING MYOSURE;  Surgeon: Rosalva Sawyer, MD;  Location: [facility];  Service:  Gynecology;  Laterality: N/A;   FOOT SURGERY     plantar fashiectomies from both feet   HERNIA REPAIR     HYSTEROSCOPY WITH D & C N/A 12/27/2017   Procedure: DILATATION AND CURETTAGE /HYSTEROSCOPY;  Surgeon: Marilynn Nest, DO;  Location: [facility];  Service: Gynecology;  Laterality: N/A;   NASAL SEPTUM SURGERY     neuromas removed from right foot     osteoma  1991   removal from right orbital   POLYPECTOMY  12/27/2017   Procedure: POLYPECTOMY;  Surgeon: Marilynn Nest, DO;  Location: [facility];  Service: Gynecology;;   removal of basal cell carcinoma (nose)     TONSILECTOMY/ADENOIDECTOMY WITH MYRINGOTOMY     TONSILLECTOMY      OB History   No obstetric history on file.      Home Medications    Prior to Admission medications   Medication Sig Start Date End Date Taking? Authorizing Provider  fluconazole  (DIFLUCAN ) 150 MG tablet Take 1 tablet (150 mg total) by mouth every 3 (three) days. 08/24/23  Yes Enedelia Dorna HERO, FNP  acetaminophen  (TYLENOL ) 500 MG tablet Take 500 mg by mouth as needed. 04/23/22   [provider]  Alirocumab  (PRALUENT ) 150 MG/ML SOAJ INJECT 1 MILLILITER UNDER THE SKIN EVERY 14 DAYS 05/23/23   Raford Riggs, MD  cetirizine  (ZYRTEC ) 10 MG tablet Take 1 tablet (10 mg total) by mouth daily as needed for allergies (Can take an extra dose during flare ups.). 05/16/23   Kozlow, Eric J, MD  cholecalciferol (VITAMIN D3) 25 MCG (1000 UNIT) tablet Take 1,000 Units by mouth daily.    [provider]  EPINEPHrine  (EPIPEN  2-PAK) 0.3 mg/0.3 mL IJ SOAJ injection Inject 0.3 mg into the muscle as needed for anaphylaxis. 05/16/23   Kozlow, Camellia PARAS, MD  fluticasone  (FLONASE ) 50 MCG/ACT nasal spray Place 2 sprays into both nostrils as needed. 05/16/23   Kozlow, Eric J, MD  hydroxychloroquine (PLAQUENIL) 200 MG tablet Take 200 mg by mouth in the morning and at bedtime. 11/27/19   [provider]  levothyroxine  (SYNTHROID )  75 MCG tablet Take 75 mcg by mouth daily before breakfast. 12/20/19   [provider]  Misc Natural Products (ATRANTIL) CAPS 1 capsule Orally Twice a day    [provider]  OVER THE COUNTER MEDICATION Alpha Lipotic Acid 300 mg capsule  taking 1 twice a day with meals    [provider]  pantoprazole  (PROTONIX ) 40 MG tablet Take 1 tablet by mouth daily at 12 noon. 06/17/21   [provider]  Polyethyl Glycol-Propyl Glycol (SYSTANE) 0.4-0.3 % GEL ophthalmic gel Place 1 Application into both eyes every 4 (four) hours as needed. 05/16/23   Kozlow, Camellia PARAS, MD  VENTOLIN  HFA 108 (90 Base) MCG/ACT inhaler Inhale 2 puffs into the lungs every 4 (four) hours as needed for wheezing or shortness of breath. 05/16/23   Kozlow, Camellia PARAS, MD    Family History Family History  Problem Relation Age of Onset   Pancreatic cancer Maternal Aunt        dx. 40s-early 65s   Congestive Heart Failure Mother 77   Lung disease Mother  Colon polyps Mother        unspecified number   Dementia Father        Lewy Body Dementia   Kidney failure Father    Stroke Father    Depression Father    Multiple sclerosis Sister    Depression Sister    Aneurysm Maternal Grandmother        brain   Heart attack Maternal Grandfather    Emphysema Paternal Grandfather    Heart Problems Paternal Grandfather    Other Sister        3 sisters have history of cysts in uterus   Depression Sister    Breast cancer Cousin        dx. 74-50   Testicular cancer Cousin        dx. 30s   Colon polyps Daughter        unspecified number    Social History Social History   Tobacco Use   Smoking status: Never   Smokeless tobacco: Never  Vaping Use   Vaping status: Never Used  Substance Use Topics   Alcohol use: No   Drug use: No     Allergies   Bee venom, Cephalexin, Ketorolac  tromethamine , Atorvastatin, Rosuvastatin, and Simvastatin   Review of Systems Review of Systems Per HPI  Physical  Exam Triage Vital Signs ED Triage Vitals  Encounter Vitals Group     BP 08/24/23 1508 113/73     Girls Systolic BP Percentile --      Girls Diastolic BP Percentile --      Boys Systolic BP Percentile --      Boys Diastolic BP Percentile --      Pulse Rate 08/24/23 1508 73     Resp 08/24/23 1508 17     Temp 08/24/23 1510 97.7 F (36.5 C)     Temp Source 08/24/23 1510 Oral     SpO2 08/24/23 1508 96 %     Weight --      Height --      Head Circumference --      Peak Flow --      Pain Score 08/24/23 1508 0     Pain Loc --      Pain Education --      Exclude from Growth Chart --    No data found.  Updated Vital Signs BP 113/73 (BP Location: Right Arm)   Pulse 73   Temp 97.7 F (36.5 C) (Oral)   Resp 17   SpO2 96%   Visual Acuity Right Eye Distance:   Left Eye Distance:   Bilateral Distance:    Right Eye Near:   Left Eye Near:    Bilateral Near:     Physical Exam Vitals and nursing note reviewed.  Constitutional:      Appearance: She is not ill-appearing or toxic-appearing.  HENT:     Head: Normocephalic and atraumatic.     Right Ear: Hearing and external ear normal.     Left Ear: Hearing and external ear normal.     Nose: Nose normal.     Mouth/Throat:     Lips: Pink.  Eyes:     General: Lids are normal. Vision grossly intact. Gaze aligned appropriately.     Extraocular Movements: Extraocular movements intact.     Conjunctiva/sclera: Conjunctivae normal.  Cardiovascular:     Rate and Rhythm: Normal rate and regular rhythm.     Heart sounds: Normal heart sounds, S1 normal and S2 normal.  Pulmonary:  Effort: Pulmonary effort is normal. No respiratory distress.     Breath sounds: Normal breath sounds and air entry.  Abdominal:     General: Bowel sounds are normal.     Palpations: Abdomen is soft.     Tenderness: There is no abdominal tenderness. There is no right CVA tenderness, left CVA tenderness or guarding.  Musculoskeletal:     Cervical back: Neck  supple.  Skin:    General: Skin is warm and dry.     Capillary Refill: Capillary refill takes less than 2 seconds.     Findings: No rash.  Neurological:     General: No focal deficit present.     Mental Status: She is alert and oriented to person, place, and time. Mental status is at baseline.     Cranial Nerves: No dysarthria or facial asymmetry.  Psychiatric:        Mood and Affect: Mood normal.        Speech: Speech normal.        Behavior: Behavior normal.        Thought Content: Thought content normal.        Judgment: Judgment normal.      UC Treatments / Results  Labs (all labs ordered are listed, but only abnormal results are displayed) Labs Reviewed  URINE CULTURE - Abnormal; Notable for the following components:      Result Value   Culture   (*)    Value: <10,000 COLONIES/mL INSIGNIFICANT GROWTH Performed at [facility], [address], [place], [place] [address]    All other components within normal limits  POCT URINALYSIS DIP (MANUAL ENTRY) - Abnormal; Notable for the following components:   Blood, UA moderate (*)    Leukocytes, UA Trace (*)    All other components within normal limits  CERVICOVAGINAL ANCILLARY ONLY    EKG   Radiology   Procedures Procedures (including critical care time)  Medications Ordered in UC Medications - No data to display  Initial Impression / Assessment and Plan / UC Course  I have reviewed the triage vital signs and the nursing notes.  Pertinent labs & imaging results that were available during my care of the patient were reviewed by me and considered in my medical decision making (see chart for details).   1. Urinary frequency, vaginal itching, acute vaginitis Urinalysis shows moderate blood and trace leukocytes, will culture urine and treat per protocol based on urine culture results. Doubt UTI, pyelonephritis, nephrolithiasis/ureteral calculus, and acute abdomen. Abdominal exam is without peritoneal  signs.  Symptoms are suspicious for acute yeast vaginitis.  Vaginal swab is pending, diflucan  ordered for empiric treatment.  Staff will treat per protocol for other positive infections (BV) if clinically indicated.  Recommend follow-up with PCP for further evaluation and management of symptoms. ER precautions discussed.  Counseled patient on potential for adverse effects with medications prescribed/recommended today, strict ER and return-to-clinic precautions discussed, patient verbalized understanding.    Final Clinical Impressions(s) / UC Diagnoses   Final diagnoses:  Urinary frequency  Acute vaginitis  Vaginal itching     Discharge Instructions      Take Diflucan  1 pill daily, then again in 3 days.  This is treating for a vaginal yeast infection which I believe is what is causing your vaginal itching and your vaginal discharge.  I have also sent your urine for a culture underneath the microscope to evaluate for urinary tract infection.  Staff will call if your urine culture is  abnormal.  Follow-up with gastroenterology as scheduled next week.     ED Prescriptions     Medication Sig Dispense Auth. Provider   fluconazole  (DIFLUCAN ) 150 MG tablet Take 1 tablet (150 mg total) by mouth every 3 (three) days. 2 tablet Enedelia Dorna HERO, FNP      PDMP not reviewed this encounter.   Enedelia Dorna HERO, [place] 08/31/23 2209

## 2023-08-24 NOTE — ED Triage Notes (Signed)
 Pt c/o vaginal itching and brownish discharge for 3-4 weeks.

## 2023-08-24 NOTE — Discharge Instructions (Addendum)
 Take Diflucan  1 pill daily, then again in 3 days.  This is treating for a vaginal yeast infection which I believe is what is causing your vaginal itching and your vaginal discharge.  I have also sent your urine for a culture underneath the microscope to evaluate for urinary tract infection.  Staff will call if your urine culture is abnormal.  Follow-up with gastroenterology as scheduled next week.

## 2023-08-25 LAB — CERVICOVAGINAL ANCILLARY ONLY
Bacterial Vaginitis (gardnerella): NEGATIVE
Candida Glabrata: NEGATIVE
Candida Vaginitis: NEGATIVE
Comment: NEGATIVE
Comment: NEGATIVE
Comment: NEGATIVE

## 2023-08-25 LAB — URINE CULTURE: Culture: <10000 — AB

## 2023-08-25 NOTE — Progress Notes (Signed)
 In response to coding query, viral panel ordered to see if patient had specific upper respiratory virus.

## 2023-08-28 ENCOUNTER — Ambulatory Visit (HOSPITAL_COMMUNITY): Payer: Self-pay

## 2023-08-30 DIAGNOSIS — R109 Unspecified abdominal pain: Secondary | ICD-10-CM | POA: Diagnosis not present

## 2023-08-30 DIAGNOSIS — M79621 Pain in right upper arm: Secondary | ICD-10-CM | POA: Diagnosis not present

## 2023-08-31 ENCOUNTER — Other Ambulatory Visit: Payer: Self-pay | Admitting: Gastroenterology

## 2023-08-31 ENCOUNTER — Ambulatory Visit
Admission: RE | Admit: 2023-08-31 | Discharge: 2023-08-31 | Disposition: A | Discharge status: Home / Self Care | Source: Ambulatory Visit | Attending: Gastroenterology | Admitting: Gastroenterology

## 2023-08-31 DIAGNOSIS — K5900 Constipation, unspecified: Secondary | ICD-10-CM

## 2023-08-31 DIAGNOSIS — R109 Unspecified abdominal pain: Secondary | ICD-10-CM | POA: Diagnosis not present

## 2023-08-31 DIAGNOSIS — I2510 Atherosclerotic heart disease of native coronary artery without angina pectoris: Secondary | ICD-10-CM | POA: Diagnosis not present

## 2023-08-31 DIAGNOSIS — E7800 Pure hypercholesterolemia, unspecified: Secondary | ICD-10-CM | POA: Diagnosis not present

## 2023-08-31 DIAGNOSIS — E039 Hypothyroidism, unspecified: Secondary | ICD-10-CM | POA: Diagnosis not present

## 2023-08-31 DIAGNOSIS — R140 Abdominal distension (gaseous): Secondary | ICD-10-CM | POA: Diagnosis not present

## 2023-09-05 DIAGNOSIS — H43813 Vitreous degeneration, bilateral: Secondary | ICD-10-CM | POA: Diagnosis not present

## 2023-09-05 DIAGNOSIS — H26492 Other secondary cataract, left eye: Secondary | ICD-10-CM | POA: Diagnosis not present

## 2023-09-05 DIAGNOSIS — H0100B Unspecified blepharitis left eye, upper and lower eyelids: Secondary | ICD-10-CM | POA: Diagnosis not present

## 2023-09-05 DIAGNOSIS — Z79899 Other long term (current) drug therapy: Secondary | ICD-10-CM | POA: Diagnosis not present

## 2023-09-05 DIAGNOSIS — H04123 Dry eye syndrome of bilateral lacrimal glands: Secondary | ICD-10-CM | POA: Diagnosis not present

## 2023-09-05 DIAGNOSIS — H0100A Unspecified blepharitis right eye, upper and lower eyelids: Secondary | ICD-10-CM | POA: Diagnosis not present

## 2023-09-06 DIAGNOSIS — G4733 Obstructive sleep apnea (adult) (pediatric): Secondary | ICD-10-CM | POA: Diagnosis not present

## 2023-09-07 ENCOUNTER — Telehealth: Payer: Self-pay

## 2023-09-07 ENCOUNTER — Other Ambulatory Visit: Payer: Self-pay | Admitting: Vascular Surgery

## 2023-09-07 DIAGNOSIS — I83893 Varicose veins of bilateral lower extremities with other complications: Secondary | ICD-10-CM

## 2023-09-07 NOTE — Telephone Encounter (Signed)
 Pt called c/o bilateral LE heaviness.    F/u with PA and LE Venous Reflux scheduled.

## 2023-10-01 DIAGNOSIS — I2510 Atherosclerotic heart disease of native coronary artery without angina pectoris: Secondary | ICD-10-CM | POA: Diagnosis not present

## 2023-10-01 DIAGNOSIS — E039 Hypothyroidism, unspecified: Secondary | ICD-10-CM | POA: Diagnosis not present

## 2023-10-01 DIAGNOSIS — E7800 Pure hypercholesterolemia, unspecified: Secondary | ICD-10-CM | POA: Diagnosis not present

## 2023-10-11 ENCOUNTER — Encounter (HOSPITAL_BASED_OUTPATIENT_CLINIC_OR_DEPARTMENT_OTHER): Payer: Self-pay | Admitting: Cardiovascular Disease

## 2023-10-31 DIAGNOSIS — I2510 Atherosclerotic heart disease of native coronary artery without angina pectoris: Secondary | ICD-10-CM | POA: Diagnosis not present

## 2023-10-31 DIAGNOSIS — E039 Hypothyroidism, unspecified: Secondary | ICD-10-CM | POA: Diagnosis not present

## 2023-10-31 DIAGNOSIS — E7800 Pure hypercholesterolemia, unspecified: Secondary | ICD-10-CM | POA: Diagnosis not present

## 2023-11-03 DIAGNOSIS — R140 Abdominal distension (gaseous): Secondary | ICD-10-CM | POA: Diagnosis not present

## 2023-11-03 DIAGNOSIS — Z860100 Personal history of colon polyps, unspecified: Secondary | ICD-10-CM | POA: Diagnosis not present

## 2023-11-03 DIAGNOSIS — K5900 Constipation, unspecified: Secondary | ICD-10-CM | POA: Diagnosis not present

## 2023-11-09 DIAGNOSIS — M154 Erosive (osteo)arthritis: Secondary | ICD-10-CM | POA: Diagnosis not present

## 2023-11-09 DIAGNOSIS — Z6826 Body mass index (BMI) 26.0-26.9, adult: Secondary | ICD-10-CM | POA: Diagnosis not present

## 2023-11-09 DIAGNOSIS — M79642 Pain in left hand: Secondary | ICD-10-CM | POA: Diagnosis not present

## 2023-11-09 DIAGNOSIS — E663 Overweight: Secondary | ICD-10-CM | POA: Diagnosis not present

## 2023-11-09 DIAGNOSIS — M79641 Pain in right hand: Secondary | ICD-10-CM | POA: Diagnosis not present

## 2023-11-29 ENCOUNTER — Emergency Department (HOSPITAL_BASED_OUTPATIENT_CLINIC_OR_DEPARTMENT_OTHER)

## 2023-11-29 ENCOUNTER — Other Ambulatory Visit: Payer: Self-pay

## 2023-11-29 ENCOUNTER — Encounter (HOSPITAL_BASED_OUTPATIENT_CLINIC_OR_DEPARTMENT_OTHER): Payer: Self-pay | Admitting: Emergency Medicine

## 2023-11-29 ENCOUNTER — Emergency Department (HOSPITAL_BASED_OUTPATIENT_CLINIC_OR_DEPARTMENT_OTHER)
Admission: EM | Admit: 2023-11-29 | Discharge: 2023-11-29 | Disposition: A | Discharge status: Home / Self Care | Attending: Emergency Medicine | Admitting: Emergency Medicine

## 2023-11-29 DIAGNOSIS — R0789 Other chest pain: Secondary | ICD-10-CM | POA: Diagnosis not present

## 2023-11-29 DIAGNOSIS — R079 Chest pain, unspecified: Secondary | ICD-10-CM | POA: Diagnosis not present

## 2023-11-29 DIAGNOSIS — R918 Other nonspecific abnormal finding of lung field: Secondary | ICD-10-CM | POA: Diagnosis not present

## 2023-11-29 LAB — CBC
HCT: 36.8 % (ref 36.0–46.0)
Hemoglobin: 12.3 g/dL (ref 12.0–15.0)
MCH: 30.8 pg (ref 26.0–34.0)
MCHC: 33.4 g/dL (ref 30.0–36.0)
MCV: 92 fL (ref 80.0–100.0)
Platelets: 207 K/uL (ref 150–400)
RBC: 4 MIL/uL (ref 3.87–5.11)
RDW: 12.5 % (ref 11.5–15.5)
WBC: 4.3 K/uL (ref 4.0–10.5)
nRBC: 0 % (ref 0.0–0.2)

## 2023-11-29 LAB — BASIC METABOLIC PANEL WITH GFR
Anion gap: 11 (ref 5–15)
BUN: 12 mg/dL (ref 8–23)
CO2: 26 mmol/L (ref 22–32)
Calcium: 9.4 mg/dL (ref 8.9–10.3)
Chloride: 105 mmol/L (ref 98–111)
Creatinine, Ser: 0.68 mg/dL (ref 0.44–1.00)
GFR, Estimated: >60 mL/min (ref >60)
Glucose, Bld: 107 mg/dL — ABNORMAL HIGH (ref 70–99)
Potassium: 4 mmol/L (ref 3.5–5.1)
Sodium: 142 mmol/L (ref 135–145)

## 2023-11-29 LAB — TROPONIN T, HIGH SENSITIVITY: Troponin T High Sensitivity: <15 ng/L (ref 0–19)

## 2023-11-29 NOTE — ED Notes (Signed)
 Pt transferred from WR to ED RM 3. Assuming pt care at this time.

## 2023-11-29 NOTE — ED Provider Notes (Signed)
 [place] [facility] Provider Note   CSN: [id number] Arrival date & time: 11/29/23  2103     Patient presents with: Chest Pain   Valerie Snyder is a 73 y.o. female.  She said she was shopping when she began experiencing some substernal upper abdominal fullness and discomfort associated with some sharp stabbing pain.  Lasted about 20 minutes.  Has fully resolved.  Occurred around 5 PM today.  She said she has acid reflux but it usually does not bother her during the day.  She has also had some generalized abdominal bloating that she is following with GI for.  She does see Dr. Raford for cardiology.  {Add pertinent medical, surgical, social history, OB history to YEP:[id number]} The history is provided by the patient.  Chest Pain Pain location:  Substernal area and epigastric Pain quality: pressure   Pain radiates to:  Does not radiate Progression:  Resolved Chronicity:  New Associated symptoms: heartburn   Associated symptoms: no diaphoresis, no fever and no shortness of breath        Prior to Admission medications   Medication Sig Start Date End Date Taking? Authorizing Provider  acetaminophen  (TYLENOL ) 500 MG tablet Take 500 mg by mouth as needed. 04/23/22   [provider]  Alirocumab  (PRALUENT ) 150 MG/ML SOAJ INJECT 1 MILLILITER UNDER THE SKIN EVERY 14 DAYS 05/23/23   Raford Riggs, MD  cetirizine  (ZYRTEC ) 10 MG tablet Take 1 tablet (10 mg total) by mouth daily as needed for allergies (Can take an extra dose during flare ups.). 05/16/23   Kozlow, Eric J, MD  cholecalciferol (VITAMIN D3) 25 MCG (1000 UNIT) tablet Take 1,000 Units by mouth daily.    [provider]  EPINEPHrine  (EPIPEN  2-PAK) 0.3 mg/0.3 mL IJ SOAJ injection Inject 0.3 mg into the muscle as needed for anaphylaxis. 05/16/23   Kozlow, Camellia PARAS, MD  fluconazole  (DIFLUCAN ) 150 MG tablet Take 1 tablet (150 mg total) by mouth every 3 (three) days. 08/24/23   Enedelia Dorna HERO, FNP  fluticasone  (FLONASE ) 50 MCG/ACT nasal spray Place 2 sprays into both nostrils as needed. 05/16/23   Kozlow, Eric J, MD  hydroxychloroquine (PLAQUENIL) 200 MG tablet Take 200 mg by mouth in the morning and at bedtime. 11/27/19   [provider]  levothyroxine  (SYNTHROID ) 75 MCG tablet Take 75 mcg by mouth daily before breakfast. 12/20/19   [provider]  Misc Natural Products (ATRANTIL) CAPS 1 capsule Orally Twice a day    [provider]  OVER THE COUNTER MEDICATION Alpha Lipotic Acid 300 mg capsule  taking 1 twice a day with meals    [provider]  pantoprazole  (PROTONIX ) 40 MG tablet Take 1 tablet by mouth daily at 12 noon. 06/17/21   [provider]  Polyethyl Glycol-Propyl Glycol (SYSTANE) 0.4-0.3 % GEL ophthalmic gel Place 1 Application into both eyes every 4 (four) hours as needed. 05/16/23   Kozlow, Camellia PARAS, MD  VENTOLIN  HFA 108 (90 Base) MCG/ACT inhaler Inhale 2 puffs into the lungs every 4 (four) hours as needed for wheezing or shortness of breath. 05/16/23   Kozlow, Camellia PARAS, MD    Allergies: Bee venom, Cephalexin, Ketorolac  tromethamine , Atorvastatin, Rosuvastatin, and Simvastatin    Review of Systems  Constitutional:  Negative for diaphoresis and fever.  Respiratory:  Negative for shortness of breath.   Cardiovascular:  Positive for chest pain.  Gastrointestinal:  Positive for heartburn.    Updated Vital Signs BP (!) 151/67  Pulse 80   Temp 98.6 F (37 C)   Resp 16   Ht 5' 4 (1.626 m)   Wt 68 kg   SpO2 97%   BMI 25.75 kg/m   Physical Exam Vitals and nursing note reviewed.  Constitutional:      General: She is not in acute distress.    Appearance: Normal appearance. She is well-developed.  HENT:     Head: Normocephalic and atraumatic.  Eyes:     Conjunctiva/sclera: Conjunctivae normal.  Cardiovascular:     Rate and Rhythm: Normal rate and regular rhythm.     Heart sounds: Normal heart sounds. No  murmur heard. Pulmonary:     Effort: Pulmonary effort is normal. No respiratory distress.     Breath sounds: Normal breath sounds. No stridor. No wheezing.  Abdominal:     Palpations: Abdomen is soft.     Tenderness: There is no abdominal tenderness. There is no guarding or rebound.  Musculoskeletal:        General: No tenderness or deformity. Normal range of motion.     Cervical back: Neck supple.     Right lower leg: No tenderness.     Left lower leg: No tenderness.  Skin:    General: Skin is warm and dry.  Neurological:     General: No focal deficit present.     Mental Status: She is alert.     GCS: GCS eye subscore is 4. GCS verbal subscore is 5. GCS motor subscore is 6.     (all labs ordered are listed, but only abnormal results are displayed) Labs Reviewed  BASIC METABOLIC PANEL WITH GFR  CBC  TROPONIN T, HIGH SENSITIVITY    EKG: EKG Interpretation Date/Time:  Wednesday November 29 2023 21:10:54 EDT Ventricular Rate:  81 PR Interval:  155 QRS Duration:  107 QT Interval:  383 QTC Calculation: 445 R Axis:   60  Text Interpretation: Sinus rhythm No significant change since prior 7/25 Confirmed by Towana Sharper [phone]) on 11/29/2023 9:17:11 PM  Radiology: [facility] Chest 2 View Result Date: 11/29/2023 CLINICAL DATA:  Chest pain EXAM: CHEST - 2 VIEW COMPARISON:  02/08/2022 FINDINGS: The heart size and mediastinal contours are within normal limits. Both lungs are clear. The visualized skeletal structures are unremarkable. Vague rounded opacities over the upper lungs probably reflect tcardiac monitoring leads. IMPRESSION: No active cardiopulmonary disease. Electronically Signed   By: Luke Bun M.D.   On: 11/29/2023 21:29    {Document cardiac monitor, telemetry assessment procedure when appropriate:32947} Procedures   Medications Ordered in the ED - No data to display    {Click here for ABCD2, HEART and other calculators REFRESH Note before signing:1}                               Medical Decision Making Amount and/or Complexity of Data Reviewed Labs: ordered. Radiology: ordered.   This patient complains of ***; this involves an extensive number of treatment Options and is a complaint that carries with it a high risk of complications and morbidity. The differential includes ***  I ordered, reviewed and interpreted labs, which included *** I ordered medication *** and reviewed PMP when indicated. I ordered imaging studies which included *** and I independently    visualized and interpreted imaging which showed *** Additional history obtained from *** Previous records obtained and reviewed *** I consulted *** and discussed lab and imaging findings and discussed disposition.  Cardiac monitoring reviewed, *** Social determinants considered, *** Critical Interventions: ***  After the interventions stated above, I reevaluated the patient and found *** Admission and further testing considered, ***   {Document critical care time when appropriate  Document review of labs and clinical decision tools ie CHADS2VASC2, etc  Document your independent review of radiology images and any outside records  Document your discussion with family members, caretakers and with consultants  Document social determinants of health affecting pt's care  Document your decision making why or why not admission, treatments were needed:32947:::1}   Final diagnoses:  None    ED Discharge Orders     None

## 2023-11-29 NOTE — ED Notes (Signed)
 Dc instructions given, pt verbalized understanding. PIV removed. Out of ED with steady gait, not in visible distress.

## 2023-11-29 NOTE — ED Triage Notes (Signed)
 Pt c/o chest pain starting appx 1730, full feeling in chest and sharp pain.  Also reports feeling light headed at that time. Pain subsided after appx 30 min.   Pt also reports hx of lymphedema on R flank, c/o ongoing discomfort.   Hx of acid reflux, pantoprazole  PRN

## 2023-12-01 DIAGNOSIS — I2510 Atherosclerotic heart disease of native coronary artery without angina pectoris: Secondary | ICD-10-CM | POA: Diagnosis not present

## 2023-12-01 DIAGNOSIS — E7800 Pure hypercholesterolemia, unspecified: Secondary | ICD-10-CM | POA: Diagnosis not present

## 2023-12-01 DIAGNOSIS — E039 Hypothyroidism, unspecified: Secondary | ICD-10-CM | POA: Diagnosis not present

## 2023-12-12 ENCOUNTER — Other Ambulatory Visit: Payer: Self-pay | Admitting: Family Medicine

## 2023-12-12 DIAGNOSIS — Z1231 Encounter for screening mammogram for malignant neoplasm of breast: Secondary | ICD-10-CM

## 2023-12-13 ENCOUNTER — Ambulatory Visit (HOSPITAL_COMMUNITY)
Admission: RE | Admit: 2023-12-13 | Discharge: 2023-12-13 | Disposition: A | Discharge status: Home / Self Care | Source: Ambulatory Visit | Attending: Surgery | Admitting: Surgery

## 2023-12-13 ENCOUNTER — Ambulatory Visit (INDEPENDENT_AMBULATORY_CARE_PROVIDER_SITE_OTHER): Admitting: Physician Assistant

## 2023-12-13 VITALS — BP 138/80 | HR 78 | Temp 98.1°F | Wt 162.3 lb

## 2023-12-13 DIAGNOSIS — I83893 Varicose veins of bilateral lower extremities with other complications: Secondary | ICD-10-CM | POA: Insufficient documentation

## 2023-12-13 NOTE — Patient Instructions (Signed)

## 2023-12-13 NOTE — Progress Notes (Signed)
 Office Note     CC:  follow up Requesting Provider:  Dayna Motto, DO  HPI: Valerie Snyder is a 73 y.o. (Oct 22, 1950) female who presents for evaluation of bilateral lower extremity edema and itching.  She denies any history of DVT, venous ulcerations, trauma, or prior vascular interventions.  She wears compression socks on a regular basis.  She exercises on a daily basis.  She is complaining of itching especially when wearing compression.  She does not focus on leg elevation during the day.  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   Past Medical History:  Diagnosis Date   Abnormal uterine bleeding (AUB)    Allergy     Asthma    allergy  related   Breast cancer of 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  72nd percentile 11/2018.   Complication of anesthesia    10 years ago- pt states she stopped breathing and had to be bagged   Concussion 2018   GERD (gastroesophageal reflux disease)    H/O bilateral inguinal hernia repair 1992   Hypercholesteremia    Hypothyroidism    Migraines    Personal history of radiation therapy    Plantar fasciitis, bilateral    Pure hypercholesterolemia 10/10/2018   Radiation 01/12/15-02/04/15   right breast 42.17 gray   Spinal stenosis    Thyroid  disease     Past Surgical History:  Procedure Laterality Date   BLADDER SUSPENSION     BLEPHAROPLASTY Bilateral    BREAST BIOPSY Left 01/28/2022   MM LT BREAST BX W LOC DEV 1ST LESION IMAGE BX SPEC STEREO GUIDE 01/28/2022 [facility]   BREAST LUMPECTOMY Right 12/10/2014   BREAST LUMPECTOMY WITH RADIOACTIVE SEED LOCALIZATION Right 12/10/2014   Procedure: RIGHT BREAST LUMPECTOMY WITH RADIOACTIVE SEED LOCALIZATION;  Surgeon: Deward Null III, MD;  Location: [facility];  Service: General;   Laterality: Right;   CHOLECYSTECTOMY N/A 02/20/2021   Procedure: LAPAROSCOPIC CHOLECYSTECTOMY;  Surgeon: Rubin Calamity, MD;  Location: [facility] OR;  Service: General;  Laterality: N/A;   DILATATION & CURETTAGE/HYSTEROSCOPY WITH MYOSURE N/A 07/15/2020   Procedure: DILATATION & CURETTAGE/HYSTEROSCOPY WITHPOLYPECTOMY USING MYOSURE;  Surgeon: Rosalva Sawyer, MD;  Location: [facility];  Service: Gynecology;  Laterality: N/A;   FOOT SURGERY     plantar fashiectomies from both feet   HERNIA REPAIR     HYSTEROSCOPY WITH D & C N/A 12/27/2017   Procedure: DILATATION AND CURETTAGE /HYSTEROSCOPY;  Surgeon: Marilynn Nest, DO;  Location: [facility];  Service: Gynecology;  Laterality: N/A;   NASAL SEPTUM SURGERY     neuromas removed from right foot     osteoma  1991   removal from right orbital   POLYPECTOMY  12/27/2017   Procedure: POLYPECTOMY;  Surgeon: Marilynn Nest, DO;  Location: [facility];  Service: Gynecology;;   removal of basal cell carcinoma (nose)     TONSILECTOMY/ADENOIDECTOMY WITH MYRINGOTOMY     TONSILLECTOMY      Social History   Socioeconomic History   Marital status: Married    Spouse name: Not on file   Number of children: 2   Years of education: Not on file   Highest education level: Not on file  Occupational History   Occupation: retired high school special education teacher  Tobacco Use   Smoking status: Never   Smokeless tobacco:  Never  Vaping Use   Vaping status: Never Used  Substance and Sexual Activity   Alcohol use: No   Drug use: No   Sexual activity: Not on file  Other Topics Concern   Not on file  Social History Narrative   Are you right handed or left handed? Right handed   Are you currently employed ? No    What is your current occupation? Retired    Do you live at home alone? No    Who lives with you? Husband    What type of home do you live in: 1 story or 2 story? Lives in a two story home. Looking to put a  elevator in their home.         Exercises every morning        Social Drivers of Health   Financial Resource Strain: Not on file  Food Insecurity: Not on file  Transportation Needs: Not on file  Physical Activity: Not on file  Stress: Not on file  Social Connections: Not on file  Intimate Partner Violence: Unknown (05/27/2021)   Received from [facility]   Intimate Partner Violence    Fear of Current or Ex-Partner: Not on file    Family History  Problem Relation Age of Onset   Pancreatic cancer Maternal Aunt        dx. 40s-early 82s   Congestive Heart Failure Mother 32   Lung disease Mother    Colon polyps Mother        unspecified number   Dementia Father        Lewy Body Dementia   Kidney failure Father    Stroke Father    Depression Father    Multiple sclerosis Sister    Depression Sister    Aneurysm Maternal Grandmother        brain   Heart attack Maternal Grandfather    Emphysema Paternal Grandfather    Heart Problems Paternal Grandfather    Other Sister        3 sisters have history of cysts in uterus   Depression Sister    Breast cancer Cousin        dx. 45-50   Testicular cancer Cousin        dx. 30s   Colon polyps Daughter        unspecified number    Current Outpatient Medications  Medication Sig Dispense Refill   acetaminophen  (TYLENOL ) 500 MG tablet Take 500 mg by mouth as needed.     Alirocumab  (PRALUENT ) 150 MG/ML SOAJ INJECT 1 MILLILITER UNDER THE SKIN EVERY 14 DAYS 2 mL 11   cetirizine  (ZYRTEC ) 10 MG tablet Take 1 tablet (10 mg total) by mouth daily as needed for allergies (Can take an extra dose during flare ups.). 180 tablet 3   cholecalciferol (VITAMIN D3) 25 MCG (1000 UNIT) tablet Take 1,000 Units by mouth daily.     EPINEPHrine  (EPIPEN  2-PAK) 0.3 mg/0.3 mL IJ SOAJ injection Inject 0.3 mg into the muscle as needed for anaphylaxis. 1 each 1   fluconazole  (DIFLUCAN ) 150 MG tablet Take 1 tablet (150 mg total) by mouth every 3 (three) days. 2  tablet 0   fluticasone  (FLONASE ) 50 MCG/ACT nasal spray Place 2 sprays into both nostrils as needed. 48 g 3   hydroxychloroquine (PLAQUENIL) 200 MG tablet Take 200 mg by mouth in the morning and at bedtime.     levothyroxine  (SYNTHROID ) 75 MCG tablet Take 75 mcg by mouth daily before breakfast.  Misc Natural Products (ATRANTIL) CAPS 1 capsule Orally Twice a day     OVER THE COUNTER MEDICATION Alpha Lipotic Acid 300 mg capsule  taking 1 twice a day with meals     pantoprazole  (PROTONIX ) 40 MG tablet Take 1 tablet by mouth daily at 12 noon.     Polyethyl Glycol-Propyl Glycol (SYSTANE) 0.4-0.3 % GEL ophthalmic gel Place 1 Application into both eyes every 4 (four) hours as needed. 30 mL 3   VENTOLIN  HFA 108 (90 Base) MCG/ACT inhaler Inhale 2 puffs into the lungs every 4 (four) hours as needed for wheezing or shortness of breath. 18 g 1   No current facility-administered medications for this visit.    Allergies  Allergen Reactions   Bee Venom Anaphylaxis   Cephalexin Anaphylaxis and Hives    Puffy eyes and lips  Other reaction(s): facial swelling   Ketorolac  Tromethamine  Anaphylaxis    Puffy lips and eyes Other reaction(s): anaphylaxis   Atorvastatin     Other reaction(s): muscle cramps   Rosuvastatin Other (See Comments)    Other reaction(s): muscle cramps  Other reaction(s): Other (See Comments)   Simvastatin     Other reaction(s): muscle cramps Other reaction(s): muscle cramps, Other (See Comments) Other reaction(s): muscle cramps      REVIEW OF SYSTEMS:  Negative unless noted in HPI [X]  denotes positive finding, [ ]  denotes negative finding Cardiac  Comments:  Chest pain or chest pressure:    Shortness of breath upon exertion:    Short of breath when lying flat:    Irregular heart rhythm:        Vascular    Pain in calf, thigh, or hip brought on by ambulation:    Pain in feet at night that wakes you up from your sleep:     Blood clot in your veins:    Leg swelling:          Pulmonary    Oxygen at home:    Productive cough:     Wheezing:         Neurologic    Sudden weakness in arms or legs:     Sudden numbness in arms or legs:     Sudden onset of difficulty speaking or slurred speech:    Temporary loss of vision in one eye:     Problems with dizziness:         Gastrointestinal    Blood in stool:     Vomited blood:         Genitourinary    Burning when urinating:     Blood in urine:        Psychiatric    Major depression:         Hematologic    Bleeding problems:    Problems with blood clotting too easily:        Skin    Rashes or ulcers:        Constitutional    Fever or chills:      PHYSICAL EXAMINATION:  Vitals:   12/13/23 0946  BP: 138/80  Pulse: 78  Temp: 98.1 F (36.7 C)  TempSrc: Temporal  Weight: 162 lb 4.8 oz (73.6 kg)    General:  WDWN in NAD; vital signs documented above Gait: Not observed HENT: WNL, normocephalic Pulmonary: normal non-labored breathing Cardiac: regular HR Abdomen: soft, NT, no masses Skin: without rashes Vascular Exam/Pulses: Palpable DP pulses bilaterally Extremities: without ischemic changes, without Gangrene , without cellulitis; without open wounds;  Musculoskeletal: no  muscle wasting or atrophy  Neurologic: A&O X 3 Psychiatric:  The pt has Normal affect.   Non-Invasive Vascular Imaging:   Left lower extremity venous reflux study negative for DVT Negative for deep venous reflux Negative for any significant superficial venous reflux    ASSESSMENT/PLAN:: 73 y.o. female here for follow up for evaluation of discomfort in bilateral lower extremities  Valerie Snyder is a 73 year old female who presents for evaluation of leg discomfort especially in the left lower extremity.  She has no history of DVT, venous ulcerations, trauma, or prior vascular intervention.  She wears compression on a regular basis however complains of itching especially when wearing compression socks.  Left lower  extremity venous reflux study is negative for DVT.  Duplex is also negative for any deep or superficial venous reflux.  She also complains of symptoms related to arthritis however these feel better with exercising and stretching.  We discussed increasing the frequency in which she elevates her legs to help with edema.  If she is bothered by her compression socks I do not believe there is a strong reason to continue to wear them.  She does have some spider veins however these are asymptomatic.  Should she develop edema and more spider veins in the future she can go back to wearing compression on a regular basis.  Nothing further to offer from a vascular surgery standpoint.  She has no evidence of arterial insufficiency with palpable pedal pulses.  I reassured her that physical exam findings as well as duplex findings are encouraging.  She can follow-up on an as-needed basis.   Donnice Sender, PA-C [facility] [phone]  Clinic MD:   Sheree

## 2023-12-15 ENCOUNTER — Other Ambulatory Visit: Payer: Self-pay | Admitting: Obstetrics and Gynecology

## 2023-12-15 DIAGNOSIS — N644 Mastodynia: Secondary | ICD-10-CM | POA: Diagnosis not present

## 2023-12-15 DIAGNOSIS — Z853 Personal history of malignant neoplasm of breast: Secondary | ICD-10-CM | POA: Diagnosis not present

## 2023-12-31 DIAGNOSIS — I2510 Atherosclerotic heart disease of native coronary artery without angina pectoris: Secondary | ICD-10-CM | POA: Diagnosis not present

## 2023-12-31 DIAGNOSIS — E039 Hypothyroidism, unspecified: Secondary | ICD-10-CM | POA: Diagnosis not present

## 2023-12-31 DIAGNOSIS — E7800 Pure hypercholesterolemia, unspecified: Secondary | ICD-10-CM | POA: Diagnosis not present

## 2024-01-03 ENCOUNTER — Other Ambulatory Visit: Payer: Self-pay

## 2024-01-03 ENCOUNTER — Telehealth: Payer: Self-pay

## 2024-01-03 ENCOUNTER — Encounter (HOSPITAL_BASED_OUTPATIENT_CLINIC_OR_DEPARTMENT_OTHER): Payer: Self-pay | Admitting: Cardiovascular Disease

## 2024-01-03 ENCOUNTER — Telehealth (HOSPITAL_BASED_OUTPATIENT_CLINIC_OR_DEPARTMENT_OTHER): Payer: Self-pay | Admitting: *Deleted

## 2024-01-03 DIAGNOSIS — Z170 Estrogen receptor positive status [ER+]: Secondary | ICD-10-CM

## 2024-01-03 NOTE — Telephone Encounter (Signed)
 Patient sent mychart message asking about continuing the [facility]  Will forward to PA team for review

## 2024-01-03 NOTE — Telephone Encounter (Signed)
 Received call from pt w/ complaint of right arm swelling and discomfort. Pt states she has hx of right arm lymphedema and believes she's having a flare up of lymphedema. RN reviewed w/ MD and verbal orders received and placed for referral with [facility] for them to eval and treat pt. Pt educated and verbalized understanding.

## 2024-01-04 ENCOUNTER — Telehealth: Payer: Self-pay | Admitting: Pharmacy Technician

## 2024-01-04 NOTE — Telephone Encounter (Signed)
   [employer] grant pending

## 2024-01-04 NOTE — Telephone Encounter (Signed)
 Patient Advocate Encounter   The patient was approved for a [employer] grant that will help cover the cost of praluent  Total amount awarded, 2500.  Effective: 01/24/24 - 01/22/25   APW:[id number] ERW:[id number] Hmnle:[id number] PI:[id number] [employer] ID: [id number]   Pharmacy provided with approval and processing information. Patient informed via mychart

## 2024-01-04 NOTE — Telephone Encounter (Signed)
 [employer] grant pending approval

## 2024-01-09 ENCOUNTER — Other Ambulatory Visit

## 2024-01-09 ENCOUNTER — Encounter

## 2024-01-11 ENCOUNTER — Other Ambulatory Visit

## 2024-01-11 ENCOUNTER — Encounter

## 2024-01-16 ENCOUNTER — Other Ambulatory Visit: Payer: Self-pay | Admitting: Nurse Practitioner

## 2024-01-16 DIAGNOSIS — N644 Mastodynia: Secondary | ICD-10-CM

## 2024-01-17 ENCOUNTER — Other Ambulatory Visit

## 2024-01-17 ENCOUNTER — Inpatient Hospital Stay
Admission: RE | Admit: 2024-01-17 | Discharge: 2024-01-17 | Attending: Obstetrics and Gynecology | Admitting: Obstetrics and Gynecology

## 2024-01-17 DIAGNOSIS — N644 Mastodynia: Secondary | ICD-10-CM

## 2024-01-17 DIAGNOSIS — R928 Other abnormal and inconclusive findings on diagnostic imaging of breast: Secondary | ICD-10-CM | POA: Diagnosis not present

## 2024-02-28 ENCOUNTER — Telehealth: Payer: Self-pay | Admitting: Radiation Oncology

## 2024-02-28 NOTE — Telephone Encounter (Signed)
 Left message for patient to call back to schedule consult per 1/8 referral.

## 2024-03-14 ENCOUNTER — Ambulatory Visit: Admitting: Radiation Oncology

## 2024-03-14 ENCOUNTER — Ambulatory Visit

## 2024-05-21 ENCOUNTER — Ambulatory Visit: Admitting: Allergy and Immunology

## 2024-07-09 ENCOUNTER — Ambulatory Visit (INDEPENDENT_AMBULATORY_CARE_PROVIDER_SITE_OTHER): Admitting: Otolaryngology

## 8386-10-02 DEATH — deceased
